# Patient Record
Sex: Female | Born: 1935
Health system: Southern US, Community
[De-identification: ages and names within clinical notes are randomized; demographics above are authoritative.]

## PROBLEM LIST (undated history)

## (undated) DIAGNOSIS — I509 Heart failure, unspecified: Secondary | ICD-10-CM

## (undated) DIAGNOSIS — I10 Essential (primary) hypertension: Secondary | ICD-10-CM

## (undated) DIAGNOSIS — I739 Peripheral vascular disease, unspecified: Secondary | ICD-10-CM

## (undated) DIAGNOSIS — K219 Gastro-esophageal reflux disease without esophagitis: Secondary | ICD-10-CM

## (undated) DIAGNOSIS — I70221 Atherosclerosis of native arteries of extremities with rest pain, right leg: Secondary | ICD-10-CM

## (undated) DIAGNOSIS — Z86718 Personal history of other venous thrombosis and embolism: Secondary | ICD-10-CM

## (undated) DIAGNOSIS — E1151 Type 2 diabetes mellitus with diabetic peripheral angiopathy without gangrene: Secondary | ICD-10-CM

## (undated) DIAGNOSIS — I4819 Other persistent atrial fibrillation: Secondary | ICD-10-CM

## (undated) DIAGNOSIS — H9193 Unspecified hearing loss, bilateral: Secondary | ICD-10-CM

## (undated) DIAGNOSIS — K449 Diaphragmatic hernia without obstruction or gangrene: Secondary | ICD-10-CM

## (undated) DIAGNOSIS — Z8711 Personal history of peptic ulcer disease: Secondary | ICD-10-CM

## (undated) HISTORY — DX: Peripheral vascular disease, unspecified: I73.9

## (undated) HISTORY — DX: Essential (primary) hypertension: I10

## (undated) HISTORY — DX: Gastro-esophageal reflux disease without esophagitis: K21.9

## (undated) HISTORY — DX: Atherosclerosis of native arteries of extremities with rest pain, right leg: I70.221

## (undated) HISTORY — DX: Personal history of peptic ulcer disease: Z87.11

## (undated) HISTORY — DX: Diaphragmatic hernia without obstruction or gangrene: K44.9

## (undated) HISTORY — DX: Personal history of other venous thrombosis and embolism: Z86.718

## (undated) HISTORY — DX: Other persistent atrial fibrillation: I48.19

## (undated) HISTORY — DX: Heart failure, unspecified: I50.9

## (undated) HISTORY — DX: Type 2 diabetes mellitus with diabetic peripheral angiopathy without gangrene: E11.51

## (undated) HISTORY — DX: Unspecified hearing loss, bilateral: H91.93

---

## 2019-03-08 ENCOUNTER — Inpatient Hospital Stay (HOSPITAL_COMMUNITY): Payer: Medicare Other | Admitting: Certified Registered Nurse Anesthetist

## 2019-03-08 ENCOUNTER — Inpatient Hospital Stay (HOSPITAL_COMMUNITY): Payer: Medicare Other

## 2019-03-08 ENCOUNTER — Inpatient Hospital Stay (HOSPITAL_COMMUNITY)
Admission: EM | Admit: 2019-03-08 | Discharge: 2019-03-12 | DRG: 481 | Disposition: A | Discharge status: Skilled Nursing Facility | Payer: Medicare Other | Attending: Internal Medicine | Admitting: Internal Medicine

## 2019-03-08 ENCOUNTER — Other Ambulatory Visit: Payer: Self-pay

## 2019-03-08 ENCOUNTER — Emergency Department (HOSPITAL_COMMUNITY): Payer: Medicare Other

## 2019-03-08 ENCOUNTER — Encounter (HOSPITAL_COMMUNITY): Payer: Self-pay

## 2019-03-08 ENCOUNTER — Encounter (HOSPITAL_COMMUNITY)
Admission: EM | Disposition: A | Discharge status: Skilled Nursing Facility | Payer: Self-pay | Source: Home / Self Care | Attending: Internal Medicine

## 2019-03-08 DIAGNOSIS — Y92008 Other place in unspecified non-institutional (private) residence as the place of occurrence of the external cause: Secondary | ICD-10-CM

## 2019-03-08 DIAGNOSIS — I10 Essential (primary) hypertension: Secondary | ICD-10-CM | POA: Diagnosis not present

## 2019-03-08 DIAGNOSIS — I491 Atrial premature depolarization: Secondary | ICD-10-CM | POA: Diagnosis not present

## 2019-03-08 DIAGNOSIS — S72001A Fracture of unspecified part of neck of right femur, initial encounter for closed fracture: Secondary | ICD-10-CM | POA: Diagnosis not present

## 2019-03-08 DIAGNOSIS — M25561 Pain in right knee: Secondary | ICD-10-CM | POA: Diagnosis present

## 2019-03-08 DIAGNOSIS — E875 Hyperkalemia: Secondary | ICD-10-CM | POA: Diagnosis not present

## 2019-03-08 DIAGNOSIS — S72491A Other fracture of lower end of right femur, initial encounter for closed fracture: Secondary | ICD-10-CM | POA: Diagnosis not present

## 2019-03-08 DIAGNOSIS — S72401A Unspecified fracture of lower end of right femur, initial encounter for closed fracture: Secondary | ICD-10-CM | POA: Diagnosis not present

## 2019-03-08 DIAGNOSIS — R9431 Abnormal electrocardiogram [ECG] [EKG]: Secondary | ICD-10-CM | POA: Diagnosis not present

## 2019-03-08 DIAGNOSIS — Y9301 Activity, walking, marching and hiking: Secondary | ICD-10-CM | POA: Diagnosis present

## 2019-03-08 DIAGNOSIS — W010XXA Fall on same level from slipping, tripping and stumbling without subsequent striking against object, initial encounter: Secondary | ICD-10-CM | POA: Diagnosis present

## 2019-03-08 DIAGNOSIS — Z20828 Contact with and (suspected) exposure to other viral communicable diseases: Secondary | ICD-10-CM | POA: Diagnosis not present

## 2019-03-08 DIAGNOSIS — S72462S Displaced supracondylar fracture with intracondylar extension of lower end of left femur, sequela: Secondary | ICD-10-CM | POA: Diagnosis not present

## 2019-03-08 DIAGNOSIS — N390 Urinary tract infection, site not specified: Secondary | ICD-10-CM | POA: Diagnosis not present

## 2019-03-08 DIAGNOSIS — D62 Acute posthemorrhagic anemia: Secondary | ICD-10-CM | POA: Diagnosis not present

## 2019-03-08 DIAGNOSIS — Z4789 Encounter for other orthopedic aftercare: Secondary | ICD-10-CM | POA: Diagnosis not present

## 2019-03-08 DIAGNOSIS — J986 Disorders of diaphragm: Secondary | ICD-10-CM | POA: Diagnosis not present

## 2019-03-08 DIAGNOSIS — D72829 Elevated white blood cell count, unspecified: Secondary | ICD-10-CM | POA: Diagnosis not present

## 2019-03-08 DIAGNOSIS — E876 Hypokalemia: Secondary | ICD-10-CM | POA: Diagnosis not present

## 2019-03-08 DIAGNOSIS — Z03818 Encounter for observation for suspected exposure to other biological agents ruled out: Secondary | ICD-10-CM | POA: Diagnosis not present

## 2019-03-08 DIAGNOSIS — R58 Hemorrhage, not elsewhere classified: Secondary | ICD-10-CM | POA: Diagnosis not present

## 2019-03-08 DIAGNOSIS — S72009A Fracture of unspecified part of neck of unspecified femur, initial encounter for closed fracture: Secondary | ICD-10-CM | POA: Diagnosis present

## 2019-03-08 DIAGNOSIS — S72461A Displaced supracondylar fracture with intracondylar extension of lower end of right femur, initial encounter for closed fracture: Principal | ICD-10-CM | POA: Diagnosis present

## 2019-03-08 DIAGNOSIS — D500 Iron deficiency anemia secondary to blood loss (chronic): Secondary | ICD-10-CM | POA: Diagnosis not present

## 2019-03-08 DIAGNOSIS — M2550 Pain in unspecified joint: Secondary | ICD-10-CM | POA: Diagnosis not present

## 2019-03-08 DIAGNOSIS — S72491D Other fracture of lower end of right femur, subsequent encounter for closed fracture with routine healing: Secondary | ICD-10-CM | POA: Diagnosis not present

## 2019-03-08 DIAGNOSIS — I959 Hypotension, unspecified: Secondary | ICD-10-CM | POA: Diagnosis not present

## 2019-03-08 DIAGNOSIS — I119 Hypertensive heart disease without heart failure: Secondary | ICD-10-CM | POA: Diagnosis present

## 2019-03-08 DIAGNOSIS — R0902 Hypoxemia: Secondary | ICD-10-CM | POA: Diagnosis not present

## 2019-03-08 DIAGNOSIS — W19XXXA Unspecified fall, initial encounter: Secondary | ICD-10-CM

## 2019-03-08 DIAGNOSIS — S72462A Displaced supracondylar fracture with intracondylar extension of lower end of left femur, initial encounter for closed fracture: Secondary | ICD-10-CM | POA: Diagnosis not present

## 2019-03-08 DIAGNOSIS — I4891 Unspecified atrial fibrillation: Secondary | ICD-10-CM | POA: Diagnosis not present

## 2019-03-08 DIAGNOSIS — I499 Cardiac arrhythmia, unspecified: Secondary | ICD-10-CM | POA: Diagnosis not present

## 2019-03-08 DIAGNOSIS — R2689 Other abnormalities of gait and mobility: Secondary | ICD-10-CM | POA: Diagnosis not present

## 2019-03-08 DIAGNOSIS — F0390 Unspecified dementia without behavioral disturbance: Secondary | ICD-10-CM | POA: Diagnosis not present

## 2019-03-08 DIAGNOSIS — R52 Pain, unspecified: Secondary | ICD-10-CM | POA: Diagnosis not present

## 2019-03-08 DIAGNOSIS — Z7401 Bed confinement status: Secondary | ICD-10-CM | POA: Diagnosis not present

## 2019-03-08 DIAGNOSIS — Z01818 Encounter for other preprocedural examination: Secondary | ICD-10-CM | POA: Diagnosis not present

## 2019-03-08 DIAGNOSIS — S72462D Displaced supracondylar fracture with intracondylar extension of lower end of left femur, subsequent encounter for closed fracture with routine healing: Secondary | ICD-10-CM | POA: Diagnosis not present

## 2019-03-08 DIAGNOSIS — R278 Other lack of coordination: Secondary | ICD-10-CM | POA: Diagnosis not present

## 2019-03-08 DIAGNOSIS — Z419 Encounter for procedure for purposes other than remedying health state, unspecified: Secondary | ICD-10-CM

## 2019-03-08 HISTORY — PX: ORIF FEMUR FRACTURE: SHX2119

## 2019-03-08 LAB — CBC WITH DIFFERENTIAL/PLATELET
Abs Immature Granulocytes: 0.06 10*3/uL (ref 0.00–0.07)
Basophils Absolute: 0 10*3/uL (ref 0.0–0.1)
Basophils Relative: 0 %
Eosinophils Absolute: 0 10*3/uL (ref 0.0–0.5)
Eosinophils Relative: 0 %
HCT: 41.5 % (ref 36.0–46.0)
Hemoglobin: 13.6 g/dL (ref 12.0–15.0)
Immature Granulocytes: 0 %
Lymphocytes Relative: 7 %
Lymphs Abs: 1.1 10*3/uL (ref 0.7–4.0)
MCH: 32 pg (ref 26.0–34.0)
MCHC: 32.8 g/dL (ref 30.0–36.0)
MCV: 97.6 fL (ref 80.0–100.0)
Monocytes Absolute: 0.6 10*3/uL (ref 0.1–1.0)
Monocytes Relative: 4 %
Neutro Abs: 13.9 10*3/uL — ABNORMAL HIGH (ref 1.7–7.7)
Neutrophils Relative %: 89 %
Platelets: 233 10*3/uL (ref 150–400)
RBC: 4.25 MIL/uL (ref 3.87–5.11)
RDW: 12.7 % (ref 11.5–15.5)
WBC: 15.7 10*3/uL — ABNORMAL HIGH (ref 4.0–10.5)
nRBC: 0 % (ref 0.0–0.2)

## 2019-03-08 LAB — BASIC METABOLIC PANEL
Anion gap: 9 (ref 5–15)
Anion gap: 9 (ref 5–15)
BUN: 17 mg/dL (ref 8–23)
BUN: 18 mg/dL (ref 8–23)
CO2: 27 mmol/L (ref 22–32)
CO2: 28 mmol/L (ref 22–32)
Calcium: 9.1 mg/dL (ref 8.9–10.3)
Calcium: 9.4 mg/dL (ref 8.9–10.3)
Chloride: 100 mmol/L (ref 98–111)
Chloride: 101 mmol/L (ref 98–111)
Creatinine, Ser: 0.87 mg/dL (ref 0.44–1.00)
Creatinine, Ser: 0.69 mg/dL (ref 0.44–1.00)
GFR calc Af Amer: >60 mL/min (ref >60)
GFR calc Af Amer: >60 mL/min (ref >60)
GFR calc non Af Amer: >60 mL/min (ref >60)
GFR calc non Af Amer: >60 mL/min (ref >60)
Glucose, Bld: 129 mg/dL — ABNORMAL HIGH (ref 70–99)
Glucose, Bld: 124 mg/dL — ABNORMAL HIGH (ref 70–99)
Potassium: 5.8 mmol/L — ABNORMAL HIGH (ref 3.5–5.1)
Potassium: 3.9 mmol/L (ref 3.5–5.1)
Sodium: 136 mmol/L (ref 135–145)
Sodium: 138 mmol/L (ref 135–145)

## 2019-03-08 LAB — URINALYSIS, ROUTINE W REFLEX MICROSCOPIC
Bilirubin Urine: NEGATIVE
Glucose, UA: NEGATIVE mg/dL
Ketones, ur: NEGATIVE mg/dL
Nitrite: NEGATIVE
Protein, ur: 30 mg/dL — AB
Specific Gravity, Urine: 1.006 (ref 1.005–1.030)
pH: 7 (ref 5.0–8.0)

## 2019-03-08 LAB — SURGICAL PCR SCREEN
MRSA, PCR: NEGATIVE
Staphylococcus aureus: NEGATIVE

## 2019-03-08 LAB — TYPE AND SCREEN
ABO/RH(D): A POS
Antibody Screen: NEGATIVE

## 2019-03-08 LAB — SARS CORONAVIRUS 2 BY RT PCR (HOSPITAL ORDER, PERFORMED IN ~~LOC~~ HOSPITAL LAB): SARS Coronavirus 2: NEGATIVE

## 2019-03-08 LAB — ABO/RH: ABO/RH(D): A POS

## 2019-03-08 LAB — PROTIME-INR
INR: 1 (ref 0.8–1.2)
Prothrombin Time: 12.7 seconds (ref 11.4–15.2)

## 2019-03-08 SURGERY — OPEN REDUCTION INTERNAL FIXATION (ORIF) DISTAL FEMUR FRACTURE
Anesthesia: General | Laterality: Right

## 2019-03-08 MED ORDER — ONDANSETRON HCL 4 MG PO TABS: 4.0000 mg | ORAL_TABLET | Freq: Four times a day (QID) | ORAL | Status: DC | PRN

## 2019-03-08 MED ORDER — SODIUM CHLORIDE 0.9 % IV SOLN
INTRAVENOUS | Status: DC | PRN
  Administered 2019-03-08: 25 ug/min via INTRAVENOUS

## 2019-03-08 MED ORDER — HYDROCODONE-ACETAMINOPHEN 5-325 MG PO TABS
1.0000 | ORAL_TABLET | ORAL | Status: DC | PRN
  Administered 2019-03-09 – 2019-03-12 (×9): 1 via ORAL
  Filled 2019-03-08: qty 2
  Filled 2019-03-08 (×8): qty 1

## 2019-03-08 MED ORDER — LACTATED RINGERS IV SOLN
INTRAVENOUS | Status: DC | PRN
  Administered 2019-03-08 (×2): via INTRAVENOUS

## 2019-03-08 MED ORDER — ASPIRIN EC 325 MG PO TBEC
325.0000 mg | DELAYED_RELEASE_TABLET | Freq: Every day | ORAL | Status: DC
  Administered 2019-03-09 – 2019-03-12 (×4): 325 mg via ORAL
  Filled 2019-03-08 (×4): qty 1

## 2019-03-08 MED ORDER — SUCCINYLCHOLINE CHLORIDE 200 MG/10ML IV SOSY
PREFILLED_SYRINGE | INTRAVENOUS | Status: AC
  Filled 2019-03-08: qty 10

## 2019-03-08 MED ORDER — SUGAMMADEX SODIUM 200 MG/2ML IV SOLN
INTRAVENOUS | Status: DC | PRN
  Administered 2019-03-08: 175 mg via INTRAVENOUS

## 2019-03-08 MED ORDER — CALCIUM GLUCONATE-NACL 1-0.675 GM/50ML-% IV SOLN
1.0000 g | Freq: Once | INTRAVENOUS | Status: AC
  Administered 2019-03-08: 1000 mg via INTRAVENOUS
  Filled 2019-03-08: qty 50

## 2019-03-08 MED ORDER — DEXAMETHASONE SODIUM PHOSPHATE 4 MG/ML IJ SOLN
INTRAMUSCULAR | Status: DC | PRN
  Administered 2019-03-08: 6 mg via INTRAVENOUS

## 2019-03-08 MED ORDER — PROPOFOL 10 MG/ML IV BOLUS
INTRAVENOUS | Status: DC | PRN
  Administered 2019-03-08: 90 mg via INTRAVENOUS

## 2019-03-08 MED ORDER — HYDROCODONE-ACETAMINOPHEN 7.5-325 MG PO TABS
1.0000 | ORAL_TABLET | ORAL | Status: DC | PRN
  Administered 2019-03-10: 1 via ORAL
  Filled 2019-03-08: qty 1

## 2019-03-08 MED ORDER — TRANEXAMIC ACID-NACL 1000-0.7 MG/100ML-% IV SOLN
1000.0000 mg | INTRAVENOUS | Status: AC
  Administered 2019-03-08: 1000 mg via INTRAVENOUS
  Filled 2019-03-08: qty 100

## 2019-03-08 MED ORDER — METOCLOPRAMIDE HCL 5 MG/ML IJ SOLN
10.0000 mg | Freq: Once | INTRAMUSCULAR | Status: AC | PRN
  Administered 2019-03-08: 10 mg via INTRAVENOUS

## 2019-03-08 MED ORDER — PHENYLEPHRINE HCL (PRESSORS) 10 MG/ML IV SOLN
INTRAVENOUS | Status: DC | PRN
  Administered 2019-03-08: 120 ug via INTRAVENOUS

## 2019-03-08 MED ORDER — TRANEXAMIC ACID-NACL 1000-0.7 MG/100ML-% IV SOLN
INTRAVENOUS | Status: AC
  Filled 2019-03-08: qty 100

## 2019-03-08 MED ORDER — PHENOL 1.4 % MT LIQD: 1.0000 | OROMUCOSAL | Status: DC | PRN

## 2019-03-08 MED ORDER — METHOCARBAMOL 500 MG PO TABS
500.0000 mg | ORAL_TABLET | Freq: Four times a day (QID) | ORAL | Status: DC | PRN
  Administered 2019-03-09: 500 mg via ORAL
  Filled 2019-03-08: qty 1

## 2019-03-08 MED ORDER — LIDOCAINE 2% (20 MG/ML) 5 ML SYRINGE
INTRAMUSCULAR | Status: AC
  Filled 2019-03-08: qty 5

## 2019-03-08 MED ORDER — ONDANSETRON HCL 4 MG/2ML IJ SOLN
INTRAMUSCULAR | Status: AC
  Filled 2019-03-08: qty 2

## 2019-03-08 MED ORDER — DOCUSATE SODIUM 100 MG PO CAPS
100.0000 mg | ORAL_CAPSULE | Freq: Two times a day (BID) | ORAL | Status: DC
  Administered 2019-03-08 – 2019-03-12 (×7): 100 mg via ORAL
  Filled 2019-03-08 (×8): qty 1

## 2019-03-08 MED ORDER — METOCLOPRAMIDE HCL 5 MG/ML IJ SOLN: 5.0000 mg | Freq: Three times a day (TID) | INTRAMUSCULAR | Status: DC | PRN

## 2019-03-08 MED ORDER — ONDANSETRON HCL 4 MG/2ML IJ SOLN
INTRAMUSCULAR | Status: DC | PRN
  Administered 2019-03-08: 4 mg via INTRAVENOUS

## 2019-03-08 MED ORDER — ACETAMINOPHEN 325 MG PO TABS: 325.0000 mg | ORAL_TABLET | Freq: Four times a day (QID) | ORAL | Status: DC | PRN

## 2019-03-08 MED ORDER — MORPHINE SULFATE (PF) 2 MG/ML IV SOLN
2.0000 mg | INTRAVENOUS | Status: DC | PRN
  Administered 2019-03-08: 2 mg via INTRAVENOUS
  Filled 2019-03-08: qty 1

## 2019-03-08 MED ORDER — MORPHINE SULFATE (PF) 2 MG/ML IV SOLN
0.5000 mg | INTRAVENOUS | Status: DC | PRN
  Administered 2019-03-08: 0.5 mg via INTRAVENOUS
  Administered 2019-03-11: 1 mg via INTRAVENOUS
  Filled 2019-03-08 (×2): qty 1

## 2019-03-08 MED ORDER — LIDOCAINE HCL (CARDIAC) PF 100 MG/5ML IV SOSY
PREFILLED_SYRINGE | INTRAVENOUS | Status: DC | PRN
  Administered 2019-03-08: 75 mg via INTRAVENOUS

## 2019-03-08 MED ORDER — FENTANYL CITRATE (PF) 100 MCG/2ML IJ SOLN
INTRAMUSCULAR | Status: AC
  Administered 2019-03-08: 25 ug via INTRAVENOUS
  Filled 2019-03-08: qty 2

## 2019-03-08 MED ORDER — CEFAZOLIN SODIUM-DEXTROSE 2-4 GM/100ML-% IV SOLN
2.0000 g | Freq: Once | INTRAVENOUS | Status: AC
  Administered 2019-03-08: 2 g via INTRAVENOUS

## 2019-03-08 MED ORDER — ONDANSETRON HCL 4 MG/2ML IJ SOLN: 4.0000 mg | Freq: Four times a day (QID) | INTRAMUSCULAR | Status: DC | PRN

## 2019-03-08 MED ORDER — ROCURONIUM BROMIDE 10 MG/ML (PF) SYRINGE
PREFILLED_SYRINGE | INTRAVENOUS | Status: AC
  Filled 2019-03-08: qty 10

## 2019-03-08 MED ORDER — CEFAZOLIN SODIUM-DEXTROSE 2-4 GM/100ML-% IV SOLN
2.0000 g | Freq: Four times a day (QID) | INTRAVENOUS | Status: AC
  Administered 2019-03-08 – 2019-03-09 (×2): 2 g via INTRAVENOUS
  Filled 2019-03-08 (×2): qty 100

## 2019-03-08 MED ORDER — FENTANYL CITRATE (PF) 100 MCG/2ML IJ SOLN
25.0000 ug | INTRAMUSCULAR | Status: DC | PRN
  Administered 2019-03-08 (×2): 25 ug via INTRAVENOUS

## 2019-03-08 MED ORDER — FENTANYL CITRATE (PF) 100 MCG/2ML IJ SOLN
50.0000 ug | INTRAMUSCULAR | Status: DC | PRN
  Administered 2019-03-08: 50 ug via INTRAVENOUS
  Filled 2019-03-08: qty 2

## 2019-03-08 MED ORDER — TRAMADOL HCL 50 MG PO TABS: 50.0000 mg | ORAL_TABLET | Freq: Two times a day (BID) | ORAL | Status: DC | PRN

## 2019-03-08 MED ORDER — METOCLOPRAMIDE HCL 5 MG/ML IJ SOLN
INTRAMUSCULAR | Status: AC
  Filled 2019-03-08: qty 2

## 2019-03-08 MED ORDER — ROCURONIUM BROMIDE 100 MG/10ML IV SOLN
INTRAVENOUS | Status: DC | PRN
  Administered 2019-03-08: 30 mg via INTRAVENOUS

## 2019-03-08 MED ORDER — MENTHOL 3 MG MT LOZG: 1.0000 | LOZENGE | OROMUCOSAL | Status: DC | PRN

## 2019-03-08 MED ORDER — SENNOSIDES-DOCUSATE SODIUM 8.6-50 MG PO TABS
2.0000 | ORAL_TABLET | Freq: Every day | ORAL | Status: DC
  Administered 2019-03-08 – 2019-03-11 (×3): 2 via ORAL
  Filled 2019-03-08 (×4): qty 2

## 2019-03-08 MED ORDER — DEXAMETHASONE SODIUM PHOSPHATE 10 MG/ML IJ SOLN
INTRAMUSCULAR | Status: AC
  Filled 2019-03-08: qty 1

## 2019-03-08 MED ORDER — FENTANYL CITRATE (PF) 250 MCG/5ML IJ SOLN
INTRAMUSCULAR | Status: AC
  Filled 2019-03-08: qty 5

## 2019-03-08 MED ORDER — CEFAZOLIN SODIUM-DEXTROSE 2-4 GM/100ML-% IV SOLN
INTRAVENOUS | Status: AC
  Filled 2019-03-08: qty 100

## 2019-03-08 MED ORDER — FENTANYL CITRATE (PF) 100 MCG/2ML IJ SOLN
INTRAMUSCULAR | Status: DC | PRN
  Administered 2019-03-08 (×4): 50 ug via INTRAVENOUS

## 2019-03-08 MED ORDER — 0.9 % SODIUM CHLORIDE (POUR BTL) OPTIME
TOPICAL | Status: DC | PRN
  Administered 2019-03-08: 1000 mL

## 2019-03-08 MED ORDER — LACTATED RINGERS IV SOLN
INTRAVENOUS | Status: DC
  Administered 2019-03-08: 13:00:00 via INTRAVENOUS

## 2019-03-08 MED ORDER — HYDRALAZINE HCL 20 MG/ML IJ SOLN
5.0000 mg | Freq: Four times a day (QID) | INTRAMUSCULAR | Status: DC | PRN
  Administered 2019-03-12: 5 mg via INTRAVENOUS
  Filled 2019-03-08 (×2): qty 1

## 2019-03-08 MED ORDER — METHOCARBAMOL 500 MG IVPB - SIMPLE MED
INTRAVENOUS | Status: AC
  Administered 2019-03-08: 500 mg via INTRAVENOUS
  Filled 2019-03-08: qty 50

## 2019-03-08 MED ORDER — OXYCODONE HCL 5 MG PO TABS: 5.0000 mg | ORAL_TABLET | Freq: Four times a day (QID) | ORAL | Status: DC | PRN

## 2019-03-08 MED ORDER — LABETALOL HCL 5 MG/ML IV SOLN
INTRAVENOUS | Status: DC | PRN
  Administered 2019-03-08 (×2): 2.5 mg via INTRAVENOUS

## 2019-03-08 MED ORDER — LACTATED RINGERS IV SOLN
INTRAVENOUS | Status: DC
  Administered 2019-03-09 – 2019-03-11 (×4): via INTRAVENOUS

## 2019-03-08 MED ORDER — PANTOPRAZOLE SODIUM 40 MG PO TBEC
40.0000 mg | DELAYED_RELEASE_TABLET | Freq: Every day | ORAL | Status: DC
  Administered 2019-03-09 – 2019-03-12 (×4): 40 mg via ORAL
  Filled 2019-03-08 (×4): qty 1

## 2019-03-08 MED ORDER — SUCCINYLCHOLINE CHLORIDE 20 MG/ML IJ SOLN
INTRAMUSCULAR | Status: DC | PRN
  Administered 2019-03-08: 100 mg via INTRAVENOUS

## 2019-03-08 MED ORDER — ALBUMIN HUMAN 5 % IV SOLN
INTRAVENOUS | Status: DC | PRN
  Administered 2019-03-08: 17:00:00 via INTRAVENOUS

## 2019-03-08 MED ORDER — METOCLOPRAMIDE HCL 5 MG PO TABS: 5.0000 mg | ORAL_TABLET | Freq: Three times a day (TID) | ORAL | Status: DC | PRN

## 2019-03-08 MED ORDER — METHOCARBAMOL 500 MG IVPB - SIMPLE MED
500.0000 mg | Freq: Four times a day (QID) | INTRAVENOUS | Status: DC | PRN
  Administered 2019-03-08: 18:00:00 500 mg via INTRAVENOUS
  Filled 2019-03-08 (×2): qty 50

## 2019-03-08 MED ORDER — MORPHINE SULFATE (PF) 4 MG/ML IV SOLN
4.0000 mg | Freq: Once | INTRAVENOUS | Status: AC
  Administered 2019-03-08: 4 mg via INTRAVENOUS
  Filled 2019-03-08: qty 1

## 2019-03-08 MED ORDER — ACETAMINOPHEN 325 MG PO TABS: 650.0000 mg | ORAL_TABLET | Freq: Four times a day (QID) | ORAL | Status: DC | PRN

## 2019-03-08 MED ORDER — MEPERIDINE HCL 50 MG/ML IJ SOLN: 6.2500 mg | INTRAMUSCULAR | Status: DC | PRN

## 2019-03-08 SURGICAL SUPPLY — 50 items
BANDAGE ESMARK 6X9 LF (GAUZE/BANDAGES/DRESSINGS) ×1 IMPLANT
BIT DRILL 4.3 (BIT) ×1 IMPLANT
BIT DRILL QC 3.3X195 (BIT) ×2 IMPLANT
BNDG COHESIVE 4X5 TAN STRL (GAUZE/BANDAGES/DRESSINGS) ×2 IMPLANT
BNDG ELASTIC 6X5.8 VLCR STR LF (GAUZE/BANDAGES/DRESSINGS) ×2 IMPLANT
BNDG ESMARK 6X9 LF (GAUZE/BANDAGES/DRESSINGS) ×2
BNDG GAUZE ELAST 4 BULKY (GAUZE/BANDAGES/DRESSINGS) ×2 IMPLANT
CAP LOCK NCB (Cap) ×14 IMPLANT
COVER SURGICAL LIGHT HANDLE (MISCELLANEOUS) ×2 IMPLANT
COVER WAND RF STERILE (DRAPES) IMPLANT
CUFF TOURN SGL QUICK 34 (TOURNIQUET CUFF) ×1
CUFF TRNQT CYL 34X4.125X (TOURNIQUET CUFF) ×1 IMPLANT
DRAPE C-ARM 42X120 X-RAY (DRAPES) ×2 IMPLANT
DRAPE C-ARMOR (DRAPES) ×2 IMPLANT
DRAPE SHEET LG 3/4 BI-LAMINATE (DRAPES) ×2 IMPLANT
DRILL BIT 4.3 (BIT) ×1
DRSG AQUACEL AG ADV 3.5X10 (GAUZE/BANDAGES/DRESSINGS) IMPLANT
DRSG AQUACEL AG ADV 3.5X14 (GAUZE/BANDAGES/DRESSINGS) ×2 IMPLANT
DRSG EMULSION OIL 3X16 NADH (GAUZE/BANDAGES/DRESSINGS) IMPLANT
DRSG PAD ABDOMINAL 8X10 ST (GAUZE/BANDAGES/DRESSINGS) ×2 IMPLANT
ELECT REM PT RETURN 15FT ADLT (MISCELLANEOUS) ×2 IMPLANT
GAUZE SPONGE 4X4 12PLY STRL (GAUZE/BANDAGES/DRESSINGS) ×2 IMPLANT
GAUZE XEROFORM 5X9 LF (GAUZE/BANDAGES/DRESSINGS) ×2 IMPLANT
GLOVE BIO SURGEON STRL SZ7.5 (GLOVE) ×4 IMPLANT
GLOVE BIOGEL PI IND STRL 8 (GLOVE) ×1 IMPLANT
GLOVE BIOGEL PI INDICATOR 8 (GLOVE) ×1
GLOVE ECLIPSE 8.0 STRL XLNG CF (GLOVE) ×2 IMPLANT
GOWN STRL REUS W/TWL XL LVL3 (GOWN DISPOSABLE) ×2 IMPLANT
K-WIRE 2.0 (WIRE) ×2
K-WIRE FXSTD 280X2XNS SS (WIRE) ×2
KIT BASIN OR (CUSTOM PROCEDURE TRAY) ×2 IMPLANT
KIT TURNOVER KIT A (KITS) IMPLANT
KWIRE FXSTD 280X2XNS SS (WIRE) ×2 IMPLANT
NS IRRIG 1000ML POUR BTL (IV SOLUTION) ×2 IMPLANT
PACK TOTAL JOINT (CUSTOM PROCEDURE TRAY) ×2 IMPLANT
PLATE NCB PPP 9H (Plate) ×2 IMPLANT
PROTECTOR NERVE ULNAR (MISCELLANEOUS) ×2 IMPLANT
SCREW 5.0 70MM (Screw) ×4 IMPLANT
SCREW CORTICAL NCB 5.0X44 (Screw) ×2 IMPLANT
SCREW CORTICAL NCB 5.0X65 (Screw) ×6 IMPLANT
SCREW NCB 5.0X34MM (Screw) ×4 IMPLANT
SCREW NCB 5.0X38 (Screw) ×2 IMPLANT
STAPLER VISISTAT 35W (STAPLE) IMPLANT
STRIP CLOSURE SKIN 1/2X4 (GAUZE/BANDAGES/DRESSINGS) ×2 IMPLANT
SUT VIC AB 0 CT1 36 (SUTURE) ×2 IMPLANT
SUT VIC AB 1 CT1 36 (SUTURE) ×4 IMPLANT
SUT VIC AB 2-0 CT1 27 (SUTURE) ×2
SUT VIC AB 2-0 CT1 TAPERPNT 27 (SUTURE) ×2 IMPLANT
TOWEL OR 17X26 10 PK STRL BLUE (TOWEL DISPOSABLE) ×4 IMPLANT
WATER STERILE IRR 1000ML POUR (IV SOLUTION) ×2 IMPLANT

## 2019-03-08 NOTE — ED Notes (Signed)
10lbs bucks traction applied by Ortho Tech.

## 2019-03-08 NOTE — ED Notes (Signed)
ED TO INPATIENT HANDOFF REPORT  Name/Age/Gender Sarah Wolf 84 y.o. female  Code Status    Code Status Orders  (From admission, onward)         Start     Ordered   03/08/19 1215  Full code  Continuous     03/08/19 1214        Code Status History    This patient has a current code status but no historical code status.   Advance Care Planning Activity      Home/SNF/Other Home  Chief Complaint knee deformity  Level of Care/Admitting Diagnosis ED Disposition    ED Disposition Condition Comment   Admit  [facility] [[id number]]  Level of Care: Telemetry [5]  Admit to tele based on following criteria: Monitor for Ischemic changes  Covid Evaluation: Asymptomatic Screening Protocol (No Symptoms)  Diagnosis: Hip fracture [facility](HCC) [[id number]]) [[id number]]  Admitting Physician: Darlin DropHALL, CAROLE N [[id number]]  Attending Physician: Darlin DropHALL, CAROLE N [[id number]]  Estimated length of stay: past midnight tomorrow  Certification:: I certify this patient will need inpatient services for at least 2 midnights  PT Class (Do Not Modify): Inpatient [101]  PT Acc Code (Do Not Modify): Private [1]       Medical History History reviewed. No pertinent past medical history.  Allergies No Known Allergies  IV Location/Drains/Wounds Patient Lines/Drains/Airways Status   Active Line/Drains/Airways    Name:   Placement date:   Placement time:   Site:   Days:   Peripheral IV 03/08/19 Right Forearm   03/08/19    -    Forearm   less than 1   Peripheral IV 03/08/19 Left Forearm   03/08/19    1055    Forearm   less than 1          Labs/Imaging Results for orders placed or performed during the hospital encounter of 03/08/19 (from the past 48 hour(s))  Basic metabolic panel     Status: Abnormal   Collection Time: 03/08/19 10:44 AM  Result Value Ref Range   Sodium 136 135 - 145 mmol/L   Potassium 5.8 (H) 3.5 - 5.1 mmol/L   Chloride 100 98 - 111 mmol/L   CO2 27 22 - 32 mmol/L    Glucose, Bld 129 (H) 70 - 99 mg/dL   BUN 17 8 - 23 mg/dL   Creatinine, Ser 2.950.87 0.44 - 1.00 mg/dL   Calcium 9.1 8.9 - 62.110.3 mg/dL   GFR calc non Af Amer >60 >60 mL/min   GFR calc Af Amer >60 >60 mL/min   Anion gap 9 5 - 15    Comment: Performed at [facility], [address] [place], [place] [address]  CBC WITH DIFFERENTIAL     Status: Abnormal   Collection Time: 03/08/19 10:44 AM  Result Value Ref Range   WBC 15.7 (H) 4.0 - 10.5 K/uL   RBC 4.25 3.87 - 5.11 MIL/uL   Hemoglobin 13.6 12.0 - 15.0 g/dL   HCT 78.441.5 69.636.0 - 29.546.0 %   MCV 97.6 80.0 - 100.0 fL   MCH 32.0 26.0 - 34.0 pg   MCHC 32.8 30.0 - 36.0 g/dL   RDW 28.412.7 13.211.5 - 44.015.5 %   Platelets 233 150 - 400 K/uL   nRBC 0.0 0.0 - 0.2 %   Neutrophils Relative % 89 %   Neutro Abs 13.9 (H) 1.7 - 7.7 K/uL   Lymphocytes Relative 7 %   Lymphs Abs 1.1 0.7 - 4.0 K/uL  Monocytes Relative 4 %   Monocytes Absolute 0.6 0.1 - 1.0 K/uL   Eosinophils Relative 0 %   Eosinophils Absolute 0.0 0.0 - 0.5 K/uL   Basophils Relative 0 %   Basophils Absolute 0.0 0.0 - 0.1 K/uL   Immature Granulocytes 0 %   Abs Immature Granulocytes 0.06 0.00 - 0.07 K/uL    Comment: Performed at [facility], [address] [place], [place] [address]  Protime-INR     Status: None   Collection Time: 03/08/19 10:44 AM  Result Value Ref Range   Prothrombin Time 12.7 11.4 - 15.2 seconds   INR 1.0 0.8 - 1.2    Comment: (NOTE) INR goal varies based on device and disease states. Performed at [facility], [address] [place], [place] [address]   SARS Coronavirus 2 [facility](Hospital order, Performed in [facility] hospital lab) Nasopharyngeal Nasopharyngeal Swab     Status: None   Collection Time: 03/08/19 10:44 AM   Specimen: Nasopharyngeal Swab  Result Value Ref Range   SARS Coronavirus 2 NEGATIVE NEGATIVE    Comment: (NOTE) If result is NEGATIVE SARS-CoV-2 target nucleic acids are NOT DETECTED. The SARS-CoV-2  RNA is generally detectable in upper and lower  respiratory specimens during the acute phase of infection. The lowest  concentration of SARS-CoV-2 viral copies this assay can detect is 250  copies / mL. A negative result does not preclude SARS-CoV-2 infection  and should not be used as the sole basis for treatment or other  patient management decisions.  A negative result may occur with  improper specimen collection / handling, submission of specimen other  than nasopharyngeal swab, presence of viral mutation(s) within the  areas targeted by this assay, and inadequate number of viral copies  (<250 copies / mL). A negative result must be combined with clinical  observations, patient history, and epidemiological information. If result is POSITIVE SARS-CoV-2 target nucleic acids are DETECTED. The SARS-CoV-2 RNA is generally detectable in upper and lower  respiratory specimens dur ing the acute phase of infection.  Positive  results are indicative of active infection with SARS-CoV-2.  Clinical  correlation with patient history and other diagnostic information is  necessary to determine patient infection status.  Positive results do  not rule out bacterial infection or co-infection with other viruses. If result is PRESUMPTIVE POSTIVE SARS-CoV-2 nucleic acids MAY BE PRESENT.   A presumptive positive result was obtained on the submitted specimen  and confirmed on repeat testing.  While 2019 novel coronavirus  (SARS-CoV-2) nucleic acids may be present in the submitted sample  additional confirmatory testing may be necessary for epidemiological  and / or clinical management purposes  to differentiate between  SARS-CoV-2 and other Sarbecovirus currently known to infect humans.  If clinically indicated additional testing with an alternate test  methodology [phone]([id number]) is advised. The SARS-CoV-2 RNA is generally  detectable in upper and lower respiratory sp ecimens during the acute  phase of  infection. The expected result is Negative. Fact Sheet for Patients:  BoilerBrush.com.cyhttps://www.fda.gov/media/136312/download Fact Sheet for Healthcare Providers: https://pope.com/https://www.fda.gov/media/136313/download This test is not yet approved or cleared by the [place] FDA and has been authorized for detection and/or diagnosis of SARS-CoV-2 by FDA under an Emergency Use Authorization (EUA).  This EUA will remain in effect (meaning this test can be used) for the duration of the COVID-19 declaration under Section 564(b)(1) of the Act, 21 U.S.C. section 360bbb-3(b)(1), unless the authorization is terminated or revoked sooner. Performed at [facility]  [place] [address] [place], [place] [address]   Urinalysis, Routine w reflex microscopic     Status: Abnormal   Collection Time: 03/08/19 12:33 PM  Result Value Ref Range   Color, Urine YELLOW YELLOW   APPearance HAZY (A) CLEAR   Specific Gravity, Urine 1.006 1.005 - 1.030   pH 7.0 5.0 - 8.0   Glucose, UA NEGATIVE NEGATIVE mg/dL   Hgb urine dipstick SMALL (A) NEGATIVE   Bilirubin Urine NEGATIVE NEGATIVE   Ketones, ur NEGATIVE NEGATIVE mg/dL   Protein, ur 30 (A) NEGATIVE mg/dL   Nitrite NEGATIVE NEGATIVE   Leukocytes,Ua MODERATE (A) NEGATIVE   RBC / HPF 0-5 0 - 5 RBC/hpf   WBC, UA 21-50 0 - 5 WBC/hpf   Bacteria, UA FEW (A) NONE SEEN    Comment: Performed at [facility], [place] [address] [place], [place] [address]  Type and screen Ordered by PROVIDER DEFAULT     Status: None   Collection Time: 03/08/19  1:18 PM  Result Value Ref Range   ABO/RH(D) A POS    Antibody Screen NEG    Sample Expiration      03/11/2019,2359 Performed at [facility], [place] [address] [place], [place] [address]   Basic metabolic panel     Status: Abnormal   Collection Time: 03/08/19  1:18 PM  Result Value Ref Range   Sodium 138 135 - 145 mmol/L   Potassium 3.9 3.5 - 5.1 mmol/L    Comment: DELTA CHECK NOTED    Chloride 101 98 - 111 mmol/L   CO2 28 22 - 32 mmol/L   Glucose, Bld 124 (H) 70 - 99 mg/dL   BUN 18 8 - 23 mg/dL   Creatinine, Ser 0.69 0.44 - 1.00 mg/dL   Calcium 9.4 8.9 - 10.3 mg/dL   GFR calc non Af Amer >60 >60 mL/min   GFR calc Af Amer >60 >60 mL/min   Anion gap 9 5 - 15    Comment: Performed at [facility], [place] [address] [place], [place] [address]   Dg Chest Port 1 View  Result Date: 03/08/2019 CLINICAL DATA:  Preoperative evaluation for femur surgery EXAM: PORTABLE CHEST 1 VIEW COMPARISON:  None. FINDINGS: Elevation of left hemidiaphragm is noted. No focal infiltrate or sizable effusion is seen. Cardiac shadow is at the upper limits of normal in size. Aortic calcifications are noted. IMPRESSION: No acute abnormality noted. Electronically Signed   By: Inez Catalina M.D.   On: 03/08/2019 11:07   Dg Knee Complete 4 Views Right  Result Date: 03/08/2019 CLINICAL DATA:  Pain following fall EXAM: RIGHT KNEE - COMPLETE 4+ VIEW COMPARISON:  None. FINDINGS: Frontal, lateral, and bilateral oblique views were obtained. There is a comminuted fracture of the femur at the diaphysis-metaphysis junction. A fracture fragment extends parallel to the cortex to extend to the joint space just lateral to the midline of the distal femur. There is posterior and lateral displacement of the distal major fracture fragment with respect to the proximal major fracture fragment. No gross dislocation. No appreciable joint effusion. No appreciable joint space narrowing or erosion. IMPRESSION: Comminuted fracture distal femur with a fracture fragment extending to the level of the knee joint along the distal femoral condyle just lateral to the midline. There is posterior and lateral displacement of the major distal fracture fragment with respect to the major proximal fragment. No gross dislocation. Other visualized bony structures appear intact. Electronically Signed  By: Bretta BangWilliam  Woodruff III M.D.   On:  03/08/2019 10:26    Pending Labs Unresulted Labs (From admission, onward)    Start     Ordered   03/09/19 0500  CBC with Differential/Platelet  Tomorrow morning,   R     03/08/19 1238   03/09/19 0500  Comprehensive metabolic panel  Tomorrow morning,   R     03/08/19 1238   03/09/19 0500  Magnesium  Tomorrow morning,   R     03/08/19 1238   03/09/19 0500  Phosphorus  Tomorrow morning,   R     03/08/19 1238   03/08/19 1250  ABO/Rh  Once,   R     03/08/19 1250          Vitals/Pain Today's Vitals   03/08/19 1300 03/08/19 1400 03/08/19 1406 03/08/19 1430  BP: 140/68 116/60  (!) 102/51  Pulse: 65 65  65  Resp: 12 17  16   Temp:      TempSrc:      SpO2: 94% 95%  (!) 85%  PainSc:   6      Isolation Precautions No active isolations  Medications Medications  morphine 2 MG/ML injection 2 mg (2 mg Intravenous Given 03/08/19 1307)  oxyCODONE (Oxy IR/ROXICODONE) immediate release tablet 5 mg (has no administration in time range)  traMADol (ULTRAM) tablet 50 mg (has no administration in time range)  senna-docusate (Senokot-S) tablet 2 tablet (has no administration in time range)  ondansetron (ZOFRAN) injection 4 mg (has no administration in time range)  acetaminophen (TYLENOL) tablet 650 mg (has no administration in time range)  hydrALAZINE (APRESOLINE) injection 5 mg (has no administration in time range)  lactated ringers infusion (has no administration in time range)  morphine 4 MG/ML injection 4 mg (4 mg Intravenous Given 03/08/19 1044)  calcium gluconate 1 g/ 50 mL sodium chloride IVPB (0 g Intravenous Stopped 03/08/19 1427)    Mobility non-ambulatory

## 2019-03-08 NOTE — Brief Op Note (Signed)
03/08/2019  5:26 PM  PATIENT:  Sarah Wolf  84 y.o. female  PRE-OPERATIVE DIAGNOSIS:  RIGHT FEMUR FRACTURE  POST-OPERATIVE DIAGNOSIS:  RIGHT FEMUR FRACTURE  PROCEDURE:  Procedure(s): OPEN REDUCTION INTERNAL FIXATION (ORIF) DISTAL FEMUR FRACTURE (Right)  SURGEON:  Surgeon(s) and Role:    Mcarthur Rossetti, MD - Primary  PHYSICIAN ASSISTANT:  Benita Stabile, PA-C  ANESTHESIA:   general  EBL:  200 cc  COUNTS:  YES  TOURNIQUET:  * No tourniquets in log *  DICTATION: .Other Dictation: Dictation Number [id number]  PLAN OF CARE: Admit to inpatient   PATIENT DISPOSITION:  PACU - guarded condition.   Delay start of Pharmacological VTE agent (>24hrs) due to surgical blood loss or risk of bleeding: no

## 2019-03-08 NOTE — ED Triage Notes (Signed)
Patient BIB EMS from home. Patient reports trip and fall in her living room approx 0630 this morning. Patient denies loss of consciousness. Patient complaining of Right knee pain- deformity noted. Denies back pain, denies hip pain. Palpable pulses equal to bilateral lower extremities. 151mcg IV Fentanyl administered en route- last dose at 0821 to 22g Right FA PIV. Patient also received 4mg  IV Zofran en route for nausea.

## 2019-03-08 NOTE — ED Notes (Signed)
Patient clothing, purse, watch, and belongings transported with patient to short stay. Patient VSS.

## 2019-03-08 NOTE — Consult Note (Addendum)
Reason for Consult:Acute Right distal femur fracture Referring Physician: Patriciaann ClanEvans, Amanda  Ramata Walkowski is an 84 y.o. female.  HPI: Patient 84 year old female who was getting up this morning to go to the bathroom when the rug slipped out from under her feet causing her to fall she states she fell on her right knee.  She denies any other injury.  Denies any loss consciousness dizziness.  When asked about her medical history she states she has no known medical problems however she has not seen a physician within the last 50 years.  Last meal was yesterday. Patient lives at home alone. Has a nephew who lives "near by" but no other family.   History reviewed. No pertinent past medical history.    History reviewed. No pertinent family history.  Family history unknown due to the fact the patient's mother died whenever she was 84 years old her father was killed by gunshot when  she was also very young.  Social History: Denies alcohol or tobacco abuse.  Allergies: No Known Allergies  Medications: I have reviewed the patient's current medications.  Results for orders placed or performed during the hospital encounter of 03/08/19 (from the past 48 hour(s))  Basic metabolic panel     Status: Abnormal   Collection Time: 03/08/19 10:44 AM  Result Value Ref Range   Sodium 136 135 - 145 mmol/L   Potassium 5.8 (H) 3.5 - 5.1 mmol/L   Chloride 100 98 - 111 mmol/L   CO2 27 22 - 32 mmol/L   Glucose, Bld 129 (H) 70 - 99 mg/dL   BUN 17 8 - 23 mg/dL   Creatinine, Ser 1.610.87 0.44 - 1.00 mg/dL   Calcium 9.1 8.9 - 09.610.3 mg/dL   GFR calc non Af Amer >60 >60 mL/min   GFR calc Af Amer >60 >60 mL/min   Anion gap 9 5 - 15    Comment: Performed at [facility], [address] [place], [place] [address]  CBC WITH DIFFERENTIAL     Status: Abnormal   Collection Time: 03/08/19 10:44 AM  Result Value Ref Range   WBC 15.7 (H) 4.0 - 10.5 K/uL   RBC 4.25 3.87 - 5.11 MIL/uL   Hemoglobin 13.6 12.0 - 15.0  g/dL   HCT 98.141.5 19.136.0 - 47.846.0 %   MCV 97.6 80.0 - 100.0 fL   MCH 32.0 26.0 - 34.0 pg   MCHC 32.8 30.0 - 36.0 g/dL   RDW 29.512.7 62.111.5 - 30.815.5 %   Platelets 233 150 - 400 K/uL   nRBC 0.0 0.0 - 0.2 %   Neutrophils Relative % 89 %   Neutro Abs 13.9 (H) 1.7 - 7.7 K/uL   Lymphocytes Relative 7 %   Lymphs Abs 1.1 0.7 - 4.0 K/uL   Monocytes Relative 4 %   Monocytes Absolute 0.6 0.1 - 1.0 K/uL   Eosinophils Relative 0 %   Eosinophils Absolute 0.0 0.0 - 0.5 K/uL   Basophils Relative 0 %   Basophils Absolute 0.0 0.0 - 0.1 K/uL   Immature Granulocytes 0 %   Abs Immature Granulocytes 0.06 0.00 - 0.07 K/uL    Comment: Performed at [facility], [address] [place], [place] [address]  Protime-INR     Status: None   Collection Time: 03/08/19 10:44 AM  Result Value Ref Range   Prothrombin Time 12.7 11.4 - 15.2 seconds   INR 1.0 0.8 - 1.2    Comment: (NOTE) INR goal varies based on device  and disease states. Performed at [facility], [address] [place], [place] [address]   SARS Coronavirus 2 [facility](Hospital order, Performed in [facility] hospital lab) Nasopharyngeal Nasopharyngeal Swab     Status: None   Collection Time: 03/08/19 10:44 AM   Specimen: Nasopharyngeal Swab  Result Value Ref Range   SARS Coronavirus 2 NEGATIVE NEGATIVE    Comment: (NOTE) If result is NEGATIVE SARS-CoV-2 target nucleic acids are NOT DETECTED. The SARS-CoV-2 RNA is generally detectable in upper and lower  respiratory specimens during the acute phase of infection. The lowest  concentration of SARS-CoV-2 viral copies this assay can detect is 250  copies / mL. A negative result does not preclude SARS-CoV-2 infection  and should not be used as the sole basis for treatment or other  patient management decisions.  A negative result may occur with  improper specimen collection / handling, submission of specimen other  than nasopharyngeal swab, presence of viral mutation(s) within  the  areas targeted by this assay, and inadequate number of viral copies  (<250 copies / mL). A negative result must be combined with clinical  observations, patient history, and epidemiological information. If result is POSITIVE SARS-CoV-2 target nucleic acids are DETECTED. The SARS-CoV-2 RNA is generally detectable in upper and lower  respiratory specimens dur ing the acute phase of infection.  Positive  results are indicative of active infection with SARS-CoV-2.  Clinical  correlation with patient history and other diagnostic information is  necessary to determine patient infection status.  Positive results do  not rule out bacterial infection or co-infection with other viruses. If result is PRESUMPTIVE POSTIVE SARS-CoV-2 nucleic acids MAY BE PRESENT.   A presumptive positive result was obtained on the submitted specimen  and confirmed on repeat testing.  While 2019 novel coronavirus  (SARS-CoV-2) nucleic acids may be present in the submitted sample  additional confirmatory testing may be necessary for epidemiological  and / or clinical management purposes  to differentiate between  SARS-CoV-2 and other Sarbecovirus currently known to infect humans.  If clinically indicated additional testing with an alternate test  methodology [phone]([id number]) is advised. The SARS-CoV-2 RNA is generally  detectable in upper and lower respiratory sp ecimens during the acute  phase of infection. The expected result is Negative. Fact Sheet for Patients:  BoilerBrush.com.cyhttps://www.fda.gov/media/136312/download Fact Sheet for Healthcare Providers: https://pope.com/https://www.fda.gov/media/136313/download This test is not yet approved or cleared by the [place] FDA and has been authorized for detection and/or diagnosis of SARS-CoV-2 by FDA under an Emergency Use Authorization (EUA).  This EUA will remain in effect (meaning this test can be used) for the duration of the COVID-19 declaration under Section 564(b)(1) of the Act, 21  U.S.C. section 360bbb-3(b)(1), unless the authorization is terminated or revoked sooner. Performed at [facility], [address] [place], [place] [address]   Urinalysis, Routine w reflex microscopic     Status: Abnormal   Collection Time: 03/08/19 12:33 PM  Result Value Ref Range   Color, Urine YELLOW YELLOW   APPearance HAZY (A) CLEAR   Specific Gravity, Urine 1.006 1.005 - 1.030   pH 7.0 5.0 - 8.0   Glucose, UA NEGATIVE NEGATIVE mg/dL   Hgb urine dipstick SMALL (A) NEGATIVE   Bilirubin Urine NEGATIVE NEGATIVE   Ketones, ur NEGATIVE NEGATIVE mg/dL   Protein, ur 30 (A) NEGATIVE mg/dL   Nitrite NEGATIVE NEGATIVE   Leukocytes,Ua MODERATE (A) NEGATIVE   RBC / HPF 0-5 0 - 5 RBC/hpf   WBC,  UA 21-50 0 - 5 WBC/hpf   Bacteria, UA FEW (A) NONE SEEN    Comment: Performed at [facility], [place] [address] [place], [place] [address]  Type and screen Ordered by PROVIDER DEFAULT     Status: None (Preliminary result)   Collection Time: 03/08/19  1:18 PM  Result Value Ref Range   ABO/RH(D) PENDING    Antibody Screen PENDING    Sample Expiration      03/11/2019,2359 Performed at [facility], [place] [address] [place],  [address]     Dg Chest Port 1 View  Result Date: 03/08/2019 CLINICAL DATA:  Preoperative evaluation for femur surgery EXAM: PORTABLE CHEST 1 VIEW COMPARISON:  None. FINDINGS: Elevation of left hemidiaphragm is noted. No focal infiltrate or sizable effusion is seen. Cardiac shadow is at the upper limits of normal in size. Aortic calcifications are noted. IMPRESSION: No acute abnormality noted. Electronically Signed   By: Inez Catalina M.D.   On: 03/08/2019 11:07   Dg Knee Complete 4 Views Right  Result Date: 03/08/2019 CLINICAL DATA:  Pain following fall EXAM: RIGHT KNEE - COMPLETE 4+ VIEW COMPARISON:  None. FINDINGS: Frontal, lateral, and bilateral oblique views were obtained. There is a comminuted fracture of the  femur at the diaphysis-metaphysis junction. A fracture fragment extends parallel to the cortex to extend to the joint space just lateral to the midline of the distal femur. There is posterior and lateral displacement of the distal major fracture fragment with respect to the proximal major fracture fragment. No gross dislocation. No appreciable joint effusion. No appreciable joint space narrowing or erosion. IMPRESSION: Comminuted fracture distal femur with a fracture fragment extending to the level of the knee joint along the distal femoral condyle just lateral to the midline. There is posterior and lateral displacement of the major distal fracture fragment with respect to the major proximal fragment. No gross dislocation. Other visualized bony structures appear intact. Electronically Signed   By: Lowella Grip III M.D.   On: 03/08/2019 10:26    Review of Systems  Constitutional: Negative for chills, fever and weight loss.  Respiratory: Negative for cough and shortness of breath.   Cardiovascular: Negative for chest pain.  Gastrointestinal: Negative for heartburn, nausea and vomiting.  Musculoskeletal: Positive for falls.  Neurological: Negative for dizziness and loss of consciousness.   Blood pressure 140/68, pulse 65, temperature (!) 97 F (36.1 C), temperature source Oral, resp. rate 12, SpO2 94 %. Physical Exam  Constitutional: She is oriented to person, place, and time. She appears well-developed and well-nourished. No distress.  HENT:  Head: Normocephalic and atraumatic.  Eyes: Pupils are equal, round, and reactive to light. EOM are normal.  Cardiovascular: Normal rate.  Respiratory: Effort normal.  Musculoskeletal:     Comments: Edema and slight deformity right distal femur. Compartments soft bilatateral thighs and calves. Tenderness over right distal femur. Left lower extremity no gross deformities and non tender throughout. Dorsal pedal pulses intact bilateral. Sensation intact  bilateral feet.   Bilateral upper extremities full range of motion without pain .    Neurological: She is alert and oriented to person, place, and time.  Skin: Skin is warm and dry. She is not diaphoretic.  Psychiatric: She has a normal mood and affect.    Assessment/Plan: Closed comminuted Right distal femur fracture. Bucks traction right leg. Plan for open reduction internal fixation of the distal femur fracture later today.   Medical team treating hyperkalemia with calcium gluconate.  Non weight bearing right lower extremity. Strict bed rest.   NPO  Risk benefits of surgery discussed with patient by Dr. Magnus IvanBlackman and myself. Will precede with surgery in the near future.    GILBERT CLARK 03/08/2019, 1:53 PM

## 2019-03-08 NOTE — Transfer of Care (Signed)
Immediate Anesthesia Transfer of Care Note  Patient: Sarah Wolf  Procedure(s) Performed: OPEN REDUCTION INTERNAL FIXATION (ORIF) DISTAL FEMUR FRACTURE (Right )  Patient Location: PACU  Anesthesia Type:General  Level of Consciousness: awake, alert , oriented and patient cooperative  Airway & Oxygen Therapy: Patient Spontanous Breathing and Patient connected to face mask oxygen  Post-op Assessment: Report given to RN, Post -op Vital signs reviewed and stable and Patient moving all extremities X 4  Post vital signs: stable  Last Vitals:  Vitals Value Taken Time  BP 165/72 03/08/19 1800  Temp    Pulse 68 03/08/19 1802  Resp 15 03/08/19 1802  SpO2 100 % 03/08/19 1802  Vitals shown include unvalidated device data.  Last Pain:  Vitals:   03/08/19 1504  TempSrc:   PainSc: 10-Worst pain ever      Patients Stated Pain Goal: 6 (37/62/83 1517)  Complications: No apparent anesthesia complications

## 2019-03-08 NOTE — H&P (Signed)
History and Physical  Sarah Wolf TKZ:[id number] DOB: 1935-03-29 DOA: 03/08/2019  Referring physician: Dr. Vanita Panda PCP: Patient, No Pcp Per  Outpatient Specialists: None Patient coming from: Home  Chief Complaint: I tripped and fell  HPI: Sarah Wolf is a 84 y.o. female with medical history significant for no prior medical history, not taking any medication who presented after a mechanical fall at home earlier this morning.  States she was walking in her home she tripped on her own foot and fell on her right knee.  She was in her normal state of health prior to this.  Presented today via EMS due to significant right knee pain.  She lives alone and does not have any children.  She has a sister who has Parkinson's disease and a nephew who stays in contact with her.  She denies any cardiopulmonary, GI or other physical symptoms prior to this.  ED Course: Vital signs significant for elevated blood pressure in the setting of pain, improved after IV pain medication.  Lab studies remarkable for hypercalcemia with potassium of 5.8 and normal renal function.  Also remarkable for leukocytosis with WBC 15 K.  Orthopedic surgery consulted by EDP who requested [place] admission for this patient.  Review of Systems: Review of systems as noted in the HPI. All other systems reviewed and are negative.   History reviewed. No pertinent past medical history.  Social History:  has no history on file for tobacco, alcohol, and drug. She denies use of tobacco alcohol or drug abuse.  No Known Allergies  History reviewed. No pertinent family history.  Sister alive with Parkinson's disease.  Mother deceased at the age of 22 when she was 53 years old.  Father deceased at the age of 30 was killed by gunshot wound.  Prior to Admission medications   Medication Sig Start Date End Date Taking? Authorizing Provider  doxylamine, Sleep, (UNISOM) 25 MG tablet Take 25 mg by mouth at bedtime as needed for sleep.   Yes [provider]  naproxen sodium (ALEVE) 220 MG tablet Take 220 mg by mouth 2 (two) times daily as needed (pain/headache).   Yes [provider]    Physical Exam: BP 104/87   Pulse 90   Temp (!) 97 F (36.1 C) (Oral)   Resp 14   SpO2 98%   . General: 84 y.o. year-old female well developed well nourished in no acute distress.  Alert and oriented x4. . Cardiovascular: Regular rate and rhythm with no rubs or gallops.  No thyromegaly or JVD noted.  No lower extremity edema. 2/4 pulses in all 4 extremities. Marland Kitchen Respiratory: Clear to auscultation with no wheezes or rales. Good inspiratory effort. . Abdomen: Soft nontender nondistended with normal bowel sounds x4 quadrants. . Muskuloskeletal: No cyanosis, clubbing.  Right thigh edema noted.  2 out of 4 pulses in all 4 extremities bilaterally. . Neuro: CN II-XII intact, strength, sensation, reflexes . Skin: No ulcerative lesions noted or rashes . Psychiatry: Judgement and insight appear normal. Mood is appropriate for condition and setting          Labs on Admission:  Basic Metabolic Panel: Recent Labs  Lab 03/08/19 1044  NA 136  K 5.8*  CL 100  CO2 27  GLUCOSE 129*  BUN 17  CREATININE 0.87  CALCIUM 9.1   Liver Function Tests: No results for input(s): AST, ALT, ALKPHOS, BILITOT, PROT, ALBUMIN in the last 168 hours. No results for input(s): LIPASE, AMYLASE in the last 168 hours. No  results for input(s): AMMONIA in the last 168 hours. CBC: Recent Labs  Lab 03/08/19 1044  WBC 15.7*  NEUTROABS 13.9*  HGB 13.6  HCT 41.5  MCV 97.6  PLT 233   Cardiac Enzymes: No results for input(s): CKTOTAL, CKMB, CKMBINDEX, TROPONINI in the last 168 hours.  BNP (last 3 results) No results for input(s): BNP in the last 8760 hours.  ProBNP (last 3 results) No results for input(s): PROBNP in the last 8760 hours.  CBG: No results for input(s): GLUCAP in the last 168 hours.  Radiological Exams on Admission: Dg Chest Port 1 View   Result Date: 03/08/2019 CLINICAL DATA:  Preoperative evaluation for femur surgery EXAM: PORTABLE CHEST 1 VIEW COMPARISON:  None. FINDINGS: Elevation of left hemidiaphragm is noted. No focal infiltrate or sizable effusion is seen. Cardiac shadow is at the upper limits of normal in size. Aortic calcifications are noted. IMPRESSION: No acute abnormality noted. Electronically Signed   By: Alcide CleverMark  Lukens M.D.   On: 03/08/2019 11:07   Dg Knee Complete 4 Views Right  Result Date: 03/08/2019 CLINICAL DATA:  Pain following fall EXAM: RIGHT KNEE - COMPLETE 4+ VIEW COMPARISON:  None. FINDINGS: Frontal, lateral, and bilateral oblique views were obtained. There is a comminuted fracture of the femur at the diaphysis-metaphysis junction. A fracture fragment extends parallel to the cortex to extend to the joint space just lateral to the midline of the distal femur. There is posterior and lateral displacement of the distal major fracture fragment with respect to the proximal major fracture fragment. No gross dislocation. No appreciable joint effusion. No appreciable joint space narrowing or erosion. IMPRESSION: Comminuted fracture distal femur with a fracture fragment extending to the level of the knee joint along the distal femoral condyle just lateral to the midline. There is posterior and lateral displacement of the major distal fracture fragment with respect to the major proximal fragment. No gross dislocation. Other visualized bony structures appear intact. Electronically Signed   By: Bretta BangWilliam  Woodruff III M.D.   On: 03/08/2019 10:26    EKG: I independently viewed the EKG done and my findings are as followed: Sinus arrhythmia with rate of 64.  No specific ST-T changes.  QTc 455.  Assessment/Plan Present on Admission: . Hip fracture (HCC)  Active Problems:   Hip fracture (HCC)  Acute right distal comminuted femur fracture Presented after a mechanical fall this morning Was in her usual state of health prior to  this fall Optimize pain control Orthopedic surgery has been contacted by EDP Possible OR for repair CSW contacted to assist with placement for possible SNF/rehab post surgery  Leukocytosis, likely reactive Chest x-ray unremarkable for any acute findings UA pending Afebrile No sign of active infective process  Hyperkalemia in the setting of acute femoral fracture Presented with potassium 5.8 Ordered IV calcium gluconate 1 g once Repeat BMP and treat if persistent or worse Start lactated ringer at 75 cc/h x 1 day  Elevated blood pressure in the setting of severe pain post femoral fracture No prior history of hypertension Optimize pain control Closely monitor vital signs  Risks: Patient is at high risk for decompensation due to acute right distal femoral fracture requiring surgical intervention and advanced age.  She will require at least 2 midnights for further evaluation and treatment of current medical condition.   DVT prophylaxis: SCDs for now due to possible procedure in the OR.  Defer anticoagulant/chemical DVT prophylaxis post procedure to orthopedic surgery.  Code Status: Full code as stated by  the patient herself.  She is alert and oriented x4.  Family Communication: None at bedside.  Disposition Plan: Admit to telemetry unit.  Consults called: Orthopedic surgery contacted by EDP.  Admission status: Inpatient status.    Darlin Droparole N Linh Johannes MD [facility] Pager [phone]  If 7PM-7AM, please contact night-coverage [email or web address] Password [email or web address]  03/08/2019, 12:18 PM

## 2019-03-08 NOTE — ED Notes (Signed)
Bud Robers (nephew) [phone] please call and give update if possible.

## 2019-03-08 NOTE — ED Provider Notes (Signed)
[facility]-EMERGENCY DEPT Provider Note   CSN: [id number] Arrival date & time: 03/08/19  [id number]     History   Chief Complaint Chief Complaint  Patient presents with  . Fall  . Knee Pain    HPI Sarah Wolf is a 84 y.o. female.     HPI Patient presents after mechanical fall with pain in her left knee. Patient states that she is generally well, does not see physicians regularly, and was in her usual state of health. This morning, just prior to EMS notification the patient was walking, when her feet got caught and she fell onto her left knee. Since that time she has been unable to ambulate, bear weight secondary to severe pain in the knee. Pain is diffuse, worse with any attempted motion of the leg. She denies trauma to other areas, pain in other areas. She has had mild relief with fentanyl, 1 5 0 mcg provided in route by EMS.   History reviewed. No pertinent past medical history.  Patient Active Problem List   Diagnosis Date Noted  . Hip fracture (HCC) 03/08/2019  . Closed comminuted intra-articular fracture of distal femur, right, initial encounter (HCC)     History reviewed. No pertinent surgical history.   OB History   No obstetric history on file.      Home Medications    Prior to Admission medications   Medication Sig Start Date End Date Taking? Authorizing Provider  doxylamine, Sleep, (UNISOM) 25 MG tablet Take 25 mg by mouth at bedtime as needed for sleep.   Yes [provider]  naproxen sodium (ALEVE) 220 MG tablet Take 220 mg by mouth 2 (two) times daily as needed (pain/headache).   Yes [provider]    Family History History reviewed. No pertinent family history.  Social History Social History   Tobacco Use  . Smoking status: Never Smoker  . Smokeless tobacco: Never Used  Substance Use Topics  . Alcohol use: Not on file  . Drug use: Not on file     Allergies   Patient has no known allergies.    Review of Systems Review of Systems  Constitutional:       Per HPI, otherwise negative  HENT:       Per HPI, otherwise negative  Respiratory:       Per HPI, otherwise negative  Cardiovascular:       Per HPI, otherwise negative  Gastrointestinal: Negative for vomiting.  Endocrine:       Negative aside from HPI  Genitourinary:       Neg aside from HPI   Musculoskeletal:       Per HPI, otherwise negative  Skin: Negative.   Neurological: Negative for syncope.     Physical Exam Updated Vital Signs BP (!) 168/78   Pulse 79   Temp 98.5 F (36.9 C) (Oral)   Resp 18   Ht 5\' 6"  (1.676 m)   Wt 63.5 kg   SpO2 99%   BMI 22.60 kg/m   Physical Exam Vitals signs and nursing note reviewed.  Constitutional:      General: She is not in acute distress.    Appearance: She is well-developed.  HENT:     Head: Normocephalic and atraumatic.  Eyes:     Conjunctiva/sclera: Conjunctivae normal.  Cardiovascular:     Rate and Rhythm: Normal rate and regular rhythm.  Pulmonary:     Effort: Pulmonary effort is normal. No respiratory distress.     Breath  sounds: Normal breath sounds. No stridor.  Abdominal:     General: There is no distension.  Musculoskeletal:     Right hip: Normal.     Right ankle: Normal.       Legs:  Skin:    General: Skin is warm and dry.  Neurological:     Mental Status: She is alert and oriented to person, place, and time.     Cranial Nerves: No cranial nerve deficit.      ED Treatments / Results  Labs (all labs ordered are listed, but only abnormal results are displayed) Labs Reviewed  BASIC METABOLIC PANEL - Abnormal; Notable for the following components:      Result Value   Potassium 5.8 (*)    Glucose, Bld 129 (*)    All other components within normal limits  CBC WITH DIFFERENTIAL/PLATELET - Abnormal; Notable for the following components:   WBC 15.7 (*)    Neutro Abs 13.9 (*)    All other components within normal limits  BASIC METABOLIC PANEL  - Abnormal; Notable for the following components:   Glucose, Bld 124 (*)    All other components within normal limits  URINALYSIS, ROUTINE W REFLEX MICROSCOPIC - Abnormal; Notable for the following components:   APPearance HAZY (*)    Hgb urine dipstick SMALL (*)    Protein, ur 30 (*)    Leukocytes,Ua MODERATE (*)    Bacteria, UA FEW (*)    All other components within normal limits  SARS CORONAVIRUS 2 (HOSPITAL ORDER, [place] [facility])  SURGICAL PCR SCREEN  PROTIME-INR  TYPE AND SCREEN  ABO/RH    Radiology Dg Chest Port 1 View  Result Date: 03/08/2019 CLINICAL DATA:  Preoperative evaluation for femur surgery EXAM: PORTABLE CHEST 1 VIEW COMPARISON:  None. FINDINGS: Elevation of left hemidiaphragm is noted. No focal infiltrate or sizable effusion is seen. Cardiac shadow is at the upper limits of normal in size. Aortic calcifications are noted. IMPRESSION: No acute abnormality noted. Electronically Signed   By: Inez Catalina M.D.   On: 03/08/2019 11:07   Dg Knee Complete 4 Views Right  Result Date: 03/08/2019 CLINICAL DATA:  Pain following fall EXAM: RIGHT KNEE - COMPLETE 4+ VIEW COMPARISON:  None. FINDINGS: Frontal, lateral, and bilateral oblique views were obtained. There is a comminuted fracture of the femur at the diaphysis-metaphysis junction. A fracture fragment extends parallel to the cortex to extend to the joint space just lateral to the midline of the distal femur. There is posterior and lateral displacement of the distal major fracture fragment with respect to the proximal major fracture fragment. No gross dislocation. No appreciable joint effusion. No appreciable joint space narrowing or erosion. IMPRESSION: Comminuted fracture distal femur with a fracture fragment extending to the level of the knee joint along the distal femoral condyle just lateral to the midline. There is posterior and lateral displacement of the major distal fracture fragment with respect to  the major proximal fragment. No gross dislocation. Other visualized bony structures appear intact. Electronically Signed   By: Lowella Grip III M.D.   On: 03/08/2019 10:26    Procedures Procedures (including critical care time)  Medications Ordered in ED Medications  morphine 2 MG/ML injection 2 mg ( Intravenous MAR Hold 03/08/19 1450)  oxyCODONE (Oxy IR/ROXICODONE) immediate release tablet 5 mg ( Oral MAR Hold 03/08/19 1450)  traMADol (ULTRAM) tablet 50 mg ( Oral MAR Hold 03/08/19 1450)  senna-docusate (Senokot-S) tablet 2 tablet ( Oral Automatically Held  03/16/19 2200)  ondansetron (ZOFRAN) injection 4 mg ( Intravenous MAR Hold 03/08/19 1450)  acetaminophen (TYLENOL) tablet 650 mg ( Oral MAR Hold 03/08/19 1450)  hydrALAZINE (APRESOLINE) injection 5 mg ( Intravenous MAR Hold 03/08/19 1450)  lactated ringers infusion (has no administration in time range)  ceFAZolin (ANCEF) IVPB 2g/100 mL premix (has no administration in time range)  ceFAZolin (ANCEF) 2-4 GM/100ML-% IVPB (has no administration in time range)  tranexamic acid (CYKLOKAPRON) 1000MG /12100mL IVPB (has no administration in time range)  morphine 4 MG/ML injection 4 mg (4 mg Intravenous Given 03/08/19 1044)  calcium gluconate 1 g/ 50 mL sodium chloride IVPB (0 g Intravenous Stopped 03/08/19 1427)  fentaNYL (SUBLIMAZE) 250 MCG/5ML injection (has no administration in time range)     Initial Impression / Assessment and Plan / ED Course  I have reviewed the triage vital signs and the nursing notes.  Pertinent labs & imaging results that were available during my care of the patient were reviewed by me and considered in my medical decision making (see chart for details).    Update: Patient aware of all findings, remains in substantial pain.  Update: Patient is being placed in traction, with the assistance of orthopedic technician. This elderly female presents after mechanical fall.  Patient found to have a distal femur fracture  requiring immobilization, likely surgical repair. After I come considered her case with our orthopedic colleagues, I discussed it with our internal medicine colleagues as well, the patient was admitted for definitive care.  Final Clinical Impressions(s) / ED Diagnoses   Final diagnoses:  Fall, initial encounter  Closed displaced supracondylar fracture of distal end of left femur with intracondylar extension, initial encounter [facility](HCC)     Gerhard MunchLockwood, Timber Lucarelli, MD 03/08/19 1536

## 2019-03-08 NOTE — Anesthesia Postprocedure Evaluation (Signed)
Anesthesia Post Note  Patient: Sarah Wolf  Procedure(s) Performed: OPEN REDUCTION INTERNAL FIXATION (ORIF) DISTAL FEMUR FRACTURE (Right )     Patient location during evaluation: PACU Anesthesia Type: General Level of consciousness: awake and alert Pain management: pain level controlled Vital Signs Assessment: post-procedure vital signs reviewed and stable Respiratory status: spontaneous breathing, nonlabored ventilation, respiratory function stable and patient connected to nasal cannula oxygen Cardiovascular status: blood pressure returned to baseline and stable Postop Assessment: no apparent nausea or vomiting Anesthetic complications: no Comments: Tachy episodes intraop. No issues in PACU. Cannot rule out aspiration during induction per CRNA but O2 sats stable, unlabored breathing.     Last Vitals:  Vitals:   03/08/19 1945 03/08/19 2000  BP: (!) 148/60 (!) 153/63  Pulse: (!) 57 60  Resp: 12 12  Temp:  36.9 C  SpO2: 98% 94%                   Effie Berkshire

## 2019-03-08 NOTE — Anesthesia Procedure Notes (Addendum)
Procedure Name: Intubation Date/Time: 03/08/2019 3:44 PM Performed by: Lissa Morales, CRNA Pre-anesthesia Checklist: Patient identified, Emergency Drugs available, Suction available and Patient being monitored Patient Re-evaluated:Patient Re-evaluated prior to induction Oxygen Delivery Method: Circle system utilized Preoxygenation: Pre-oxygenation with 100% oxygen Induction Type: IV induction Ventilation: Mask ventilation without difficulty Laryngoscope Size: Miller and 2 Tube type: Oral Tube size: 7.5 mm Number of attempts: 1 Airway Equipment and Method: Stylet and Oral airway Placement Confirmation: ETT inserted through vocal cords under direct vision,  positive ETCO2 and breath sounds checked- equal and bilateral Secured at: 21 cm Tube secured with: Tape Dental Injury: Teeth and Oropharynx as per pre-operative assessment  Comments: On induction with DL,dark reddish coffee ground colored fluid in back of throat. Suctioned out prior to intubation.  OG placed after industioned and ~ 250cc's pf coffeeground fluid suctioned from stomach.

## 2019-03-08 NOTE — Anesthesia Preprocedure Evaluation (Signed)
Anesthesia Evaluation  Patient identified by MRN, date of birth, ID band Patient awake    Reviewed: Allergy & Precautions, NPO status , Patient's Chart, lab work & pertinent test results  Airway Mallampati: II  TM Distance: >3 FB Neck ROM: Full    Dental no notable dental hx. (+) Edentulous Upper, Edentulous Lower   Pulmonary neg pulmonary ROS,    Pulmonary exam normal breath sounds clear to auscultation       Cardiovascular negative cardio ROS Normal cardiovascular exam Rhythm:Regular Rate:Normal     Neuro/Psych negative neurological ROS  negative psych ROS   GI/Hepatic negative GI ROS, Neg liver ROS,   Endo/Other  negative endocrine ROS  Renal/GU negative Renal ROS  negative genitourinary   Musculoskeletal negative musculoskeletal ROS (+)   Abdominal   Peds negative pediatric ROS (+)  Hematology negative hematology ROS (+)   Anesthesia Other Findings   Reproductive/Obstetrics negative OB ROS                             Anesthesia Physical Anesthesia Plan  ASA: II  Anesthesia Plan: General   Post-op Pain Management:    Induction: Intravenous  PONV Risk Score and Plan: 3 and Ondansetron and Treatment may vary due to age or medical condition  Airway Management Planned: Oral ETT  Additional Equipment:   Intra-op Plan:   Post-operative Plan: Extubation in OR  Informed Consent: I have reviewed the patients History and Physical, chart, labs and discussed the procedure including the risks, benefits and alternatives for the proposed anesthesia with the patient or authorized representative who has indicated his/her understanding and acceptance.     Dental advisory given  Plan Discussed with: CRNA  Anesthesia Plan Comments:         Anesthesia Quick Evaluation

## 2019-03-09 ENCOUNTER — Inpatient Hospital Stay (HOSPITAL_COMMUNITY): Payer: Medicare Other

## 2019-03-09 DIAGNOSIS — R9431 Abnormal electrocardiogram [ECG] [EKG]: Secondary | ICD-10-CM

## 2019-03-09 LAB — ECHOCARDIOGRAM COMPLETE
Height: 66 in
Weight: 2240 oz

## 2019-03-09 MED ORDER — CARVEDILOL 3.125 MG PO TABS
3.1250 mg | ORAL_TABLET | Freq: Two times a day (BID) | ORAL | Status: DC
  Administered 2019-03-09 – 2019-03-12 (×7): 3.125 mg via ORAL
  Filled 2019-03-09 (×7): qty 1

## 2019-03-09 NOTE — Op Note (Signed)
NAMEILARIA, MUCH MEDICAL RECORD HQ:[id number] ACCOUNT [id number] DATE OF BIRTH:1935/04/30 FACILITY: [facility] LOCATION: [facility] PHYSICIAN:Cherisse Carrell Kerry Fort, MD  OPERATIVE REPORT  DATE OF PROCEDURE:  03/08/2019  PREOPERATIVE DIAGNOSIS:  Right intraarticular distal femur fracture.  POSTOPERATIVE DIAGNOSIS:  Right intraarticular distal femur fracture.  PROCEDURE:  Open reduction internal fixation of right intraarticular distal femur fracture using a periarticular plate and screws.  IMPLANTS:  [employer] large periarticular right 9-hole plate with a combination of bicortical screws and locking screws.  SURGEON:  Lind Guest. Ninfa Linden, MD  ASSISTANT:  Erskine Emery, PA-C  ANESTHESIA:  General.  ANTIBIOTICS:  Two grams IV Ancef.  ESTIMATED BLOOD LOSS:  200 mL.  COMPLICATIONS:  None.  INDICATIONS:  The patient is an 84 year old female who lives alone, who sustained an accidental mechanical fall in her house today when she slipped on a rug.  She had the inability to ambulate and severe right knee pain.  She was brought to the [facility] Emergency Room by EMS.  She was found to have a distal femur fracture of the left femur that was intraarticular.  She was seen and graciously admitted to the medicine service for preoperative clearance and medical management.  She has not seen a  physician in 15 years and reports that she is healthy.  She seems to be still a moderate to high surgical risk, but this needs to be done based on the nature of this fracture.  This is for certainly quality of life and pain control purposes and mobility  purposes.  I had a long and thorough discussion with her about the risks of surgery including perioperative cardiac risks.  We talked about the risk of acute blood loss anemia, nerve and vessel injury, failure of fixation and nonunion and implant  failure.  We talked about the goals being decreased pain, improved mobility, and overall improved quality of  life.  DESCRIPTION OF PROCEDURE:  After informed consent was obtained and appropriate right distal femur was marked, she was brought to the operating room and general anesthesia was obtained while she was in a supine position on her stretcher.  She was then  placed supine on the operative table.  Of note, anesthesia did let us know that she did have some runs of atrial fibrillation as they were inducing her.  During the case, she was slightly hypotensive and did require some ____, but nothing seemed  otherwise out of the ordinary for someone her age.  They did feel comfortable with proceeding with surgery.  Her right thigh, knee, leg, and ankle were prepped and draped with DuraPrep and sterile drapes, including a sterile stockinette.  Time-out was  called, and she was identified as correct patient and correct right distal femur.  We then made a lateral incision from the distal femur, carrying this proximally.  We dissected down to the IT band.  The IT band was then divided longitudinally so we  could assess the fracture.  We were able to then as anatomically as possible reduce the fracture, and this was under direct visualization and direct fluoroscopy.  Once we had the fracture reduced and we irrigated the knee joint, we chose our 9-hole  periarticular right lateral plate from [employer].  We were able to secure this temporarily with K wires and confirm the fracture reduction under direct fluoroscopy.  We then placed bicortical screws proximally and locking screws distally to secure  the fixation.  We then put the knee through range of motion and assessed  the leg under direct fluoroscopy, and we were pleased with the reduction.  We then irrigated the soft tissue with normal saline solution.  We dried the knee real well, and then we  closed the IT band with interrupted #1 Vicryl suture, followed by closing the deep tissue with 0 Vicryl, 2-0 Vicryl was used to close the subcutaneous tissue, and  interrupted staples were placed on the skin.  A well-padded sterile dressing was applied.   She was awakened, extubated, and taken to the recovery room in guarded condition with the idea that she would be admitted to telemetry postoperatively.  Of note, Rexene EdisonGil Clark, PA-C, assisted during the entire case and his assistance was crucial for  facilitating all aspects of this case.  LN/NUANCE  D:03/08/2019 T:03/08/2019 JOB:[id number]

## 2019-03-09 NOTE — Progress Notes (Signed)
Subjective: 1 Day Post-Op Procedure(s) (LRB): OPEN REDUCTION INTERNAL FIXATION (ORIF) DISTAL FEMUR FRACTURE (Right) Patient reports pain as mild.  No complaints.   Objective: Vital signs in last 24 hours: Temp:  [97.8 F (36.6 C)-98.6 F (37 C)] 98.6 F (37 C) (08/22 0553) Pulse Rate:  [51-90] 67 (08/22 0553) Resp:  [12-20] 16 (08/22 0553) BP: (102-172)/(51-87) 154/62 (08/22 0553) SpO2:  [85 %-100 %] 97 % (08/22 0553) Weight:  [63.5 kg] 63.5 kg (08/21 1504)  Intake/Output from previous day: 08/21 0701 - 08/22 0700 In: 2927.8 [P.O.:480; I.V.:2040.9; IV Piggyback:406.9] Out: 8841 [YSAYT:0160; Blood:200] Intake/Output this shift: No intake/output data recorded.  Recent Labs    03/08/19 1044  HGB 13.6   Recent Labs    03/08/19 1044  WBC 15.7*  RBC 4.25  HCT 41.5  PLT 233   Recent Labs    03/08/19 1044 03/08/19 1318  NA 136 138  K 5.8* 3.9  CL 100 101  CO2 27 28  BUN 17 18  CREATININE 0.87 0.69  GLUCOSE 129* 124*  CALCIUM 9.1 9.4   Recent Labs    03/08/19 1044  INR 1.0   Right lowe extremity: Sensation intact distally Intact pulses distally Incision: dressing C/D/I Compartment soft   Assessment/Plan: 1 Day Post-Op Procedure(s) (LRB): OPEN REDUCTION INTERNAL FIXATION (ORIF) DISTAL FEMUR FRACTURE (Right)   Up with therapy non weight bearing right lower extremity.   Labs pending      Erskine Emery 03/09/2019, 9:21 AM

## 2019-03-09 NOTE — Progress Notes (Signed)
PROGRESS NOTE  Sarah Wolf  DOB: 11-15-1934  PCP: Patient, No Pcp Per UJW:[id number]  DOA: 03/08/2019  LOS: 1 day   Brief narrative: Sarah Wolf is a 84 y.o. female with no significant past medical history who presented to the ED on 8/21 after a mechanical fall.  She apparently slipped on a rug at home and fell hitting the right side of her body.    In the ED, she was hemodynamically stable.   Blood work showed potassium level elevated 5.8, WBC count elevated to 15.7. Urinalysis showed hazy yellow urine with moderate amount of leukocytes and few bacteria. Chest x-ray did not show any acute abnormality. X-ray of right knee showed comminuted fracture distal femur with a fracture fragment extending to the level of the knee joint along the distal femoral condyle just lateral to the midline. There is posterior and lateral displacement of the major distal fracture fragment with respect to the major proximal fragment. No gross dislocation. Other visualized bony structures appear intact. Orthopedic surgery was consulted.  Patient was admitted under hospitalist service for further evaluation and management. Patient underwent ORIF of right intra-articular distal femur fracture the same day.  Subjective: Patient was seen and examined this morning.  Pleasant elderly Caucasian female.  Lying on bed.  Not in distress.  On low-flow oxygen by nasal cannula.  Pain controlled.  Has a Foley catheter on.  Assessment/Plan: Acute right distal comminuted femur fracture -Secondary to mechanical fall.   -Underwent ORIF on 8/21.   -PT eval NWB RLE, Foley out, discharge planning. -Currently on SCD boots for DVT prophylaxis. -Pain control.  Leukocytosis -WBC count was elevated 15.7 on admission, likely reactive. -Chest x-ray normal, urinalysis without evidence of infection.  No fever. -Repeat CBC tomorrow.  Chest x-ray unremarkable for any acute findings  Intermittent tachycardia Elevated blood pressure  -Patient had bursts of unprovoked tachycardia up to 160 this morning. -No history of hypertension or any other cardiac issues in the past. -Obtain echocardiogram. -Since her blood pressure is also running on the higher side of normal, will start on Coreg 3.125 mg twice daily. -Monitor telemetry.  Monitor blood pressure.  Hyperkalemia -Potassium elevated to 5.8 on admission.  1 dose of IV calcium gluconate was given. -Repeat potassium normal this morning.  Mobility: PT eval Diet: Cardiac diet DVT prophylaxis:  SCD boots Code Status:   Code Status: Full Code  Family Communication:  Expected Discharge:  Pending PT eval, may need placement.  Lives at home by herself.  Consultants:  Orthopedics  Procedures:  ORIF 8/21  Antimicrobials: Anti-infectives (From admission, onward)   Start     Dose/Rate Route Frequency Ordered Stop   03/09/19 0000  ceFAZolin (ANCEF) IVPB 2g/100 mL premix     2 g 200 mL/hr over 30 Minutes Intravenous Every 6 hours 03/08/19 2009 03/09/19 0612   03/08/19 1530  ceFAZolin (ANCEF) IVPB 2g/100 mL premix     2 g 200 mL/hr over 30 Minutes Intravenous  Once 03/08/19 1516 03/08/19 1537   03/08/19 1516  ceFAZolin (ANCEF) 2-4 GM/100ML-% IVPB    Note to Pharmacy: Augustine RadarGaines, Janel   : cabinet override      03/08/19 1516 03/09/19 0329      Infusions:  . lactated ringers Stopped (03/09/19 0542)  . methocarbamol (ROBAXIN) IV 500 mg (03/08/19 1802)    Scheduled Meds: . aspirin EC  325 mg Oral Q breakfast  . docusate sodium  100 mg Oral BID  . pantoprazole  40 mg Oral Daily  .  senna-docusate  2 tablet Oral QHS    PRN meds: acetaminophen, hydrALAZINE, HYDROcodone-acetaminophen, HYDROcodone-acetaminophen, menthol-cetylpyridinium **OR** phenol, methocarbamol **OR** methocarbamol (ROBAXIN) IV, metoCLOPramide **OR** metoCLOPramide (REGLAN) injection, morphine injection, ondansetron (ZOFRAN) IV, ondansetron **OR** ondansetron (ZOFRAN) IV, traMADol   Objective:  Vitals:   03/09/19 0553 03/09/19 1002  BP: (!) 154/62 (!) 153/85  Pulse: 67 89  Resp: 16 16  Temp: 98.6 F (37 C) 98.4 F (36.9 C)  SpO2: 97% 90%    Intake/Output Summary (Last 24 hours) at 03/09/2019 1244 Last data filed at 03/09/2019 1135 Gross per 24 hour  Intake 2947.82 ml  Output 2375 ml  Net 572.82 ml   Filed Weights   03/08/19 1504  Weight: 63.5 kg   Weight change:  Body mass index is 22.6 kg/m.   Physical Exam: General exam: Appears calm and comfortable.  Skin: No rashes, lesions or ulcers. HEENT: Atraumatic, normocephalic, supple neck, no obvious bleeding Lungs: Clear to auscultation bilaterally CVS: Regular rate and rhythm, no murmur GI/Abd soft, nontender, nondistended, bowel sound present CNS: Alert, awake, oriented x3 Psychiatry: Mood appropriate Extremities: No pedal edema, no calf tenderness  Data Review: I have personally reviewed the laboratory data and studies available.  Recent Labs  Lab 03/08/19 1044  WBC 15.7*  NEUTROABS 13.9*  HGB 13.6  HCT 41.5  MCV 97.6  PLT 233   Recent Labs  Lab 03/08/19 1044 03/08/19 1318  NA 136 138  K 5.8* 3.9  CL 100 101  CO2 27 28  GLUCOSE 129* 124*  BUN 17 18  CREATININE 0.87 0.69  CALCIUM 9.1 9.4    Terrilee Croak, MD  [facility] 03/09/2019

## 2019-03-09 NOTE — Progress Notes (Signed)
*  PRELIMINARY RESULTS* Echocardiogram 2D Echocardiogram has been performed.  Sarah Wolf 03/09/2019, 3:17 PM

## 2019-03-09 NOTE — Progress Notes (Signed)
PT Note  Patient Details Name: Versa Craton MRN: [id number] DOB: Apr 15, 1935   Ortho please clarify if you would like ROM of R LE and/or a KI.   Thank you  Clide Dales 03/09/2019, 1:20 PM  Clide Dales, PT Acute Rehabilitation Services Pager: [phone] Office: [phone] 03/09/2019

## 2019-03-09 NOTE — Evaluation (Signed)
Physical Therapy Evaluation Patient Details Name: Sarah Wolf MRN: [id number] DOB: 01-11-1935 Today's Date: 03/09/2019   History of Present Illness  pt admit 03/08/2019 after falling at home in the morning in her living room. s/p R distal femur fracture ORIF, NWB RLE  Clinical Impression  Pt s/p R distal femur ORIF presents with decreased ability for all mobility and increased pain limiting mobility also due to now NWB on RLE. Will continue to follow to work on transfers, short gait, and other safe mobility while here. Recommend SNF at this time due to difficulties to manage this home alone at this time.     Follow Up Recommendations SNF    Equipment Recommendations  Rolling walker with 5" wheels;Wheelchair (measurements PT)(or get at next venue)    Recommendations for Other Services       Precautions / Restrictions Precautions Precaution Comments: aksing MD to clarify if he wants ROM in RLE or a KI Restrictions Weight Bearing Restrictions: Yes RLE Weight Bearing: Non weight bearing      Mobility  Bed Mobility Overal bed mobility: Needs Assistance Bed Mobility: Supine to Sit     Supine to sit: Mod assist;+2 for physical assistance     General bed mobility comments: one person for upper body and the other to completely cradle the RLE for support. Pt was unable to dangle the R LE at EOB so it was manually cradled for support and comfort.  Transfers Overall transfer level: Needs assistance Equipment used: Rolling walker (2 wheeled) Transfers: Sit to/from [facility] Sit to Stand: Mod assist;+2 physical assistance Stand pivot transfers: Mod assist;+2 physical assistance       General transfer comment: again had to cradle and hold the RLE for support to minimize pain. Pt was able to stand and support herself at RW and creat a pivot on the LLE for trasnfer to recliner.  Ambulation/Gait                Stairs            Wheelchair Mobility     Modified Rankin (Stroke Patients Only)       Balance                                             Pertinent Vitals/Pain Pain Assessment: 0-10 Pain Score: 7  Pain Location: R knee and posterior knee during any mobility. May benefit from a KI to give the knee some extra support while moving. Pain Descriptors / Indicators: Aching;Shooting;Sore Pain Intervention(s): Monitored during session;Repositioned;Ice applied    Home Living Family/patient expects to be discharged to:: Private residence Living Arrangements: Alone Available Help at Discharge: Available PRN/intermittently Type of Home: House Home Access: Stairs to enter   Technical brewer of Steps: 1-2 Home Layout: One level        Prior Function Level of Independence: Independent         Comments: very independent drives and active     Hand Dominance        Extremity/Trunk Assessment        Lower Extremity Assessment Lower Extremity Assessment: Overall WFL for tasks assessed;RLE deficits/detail RLE Deficits / Details: NT due to R LE surgery ORIF of dital femur ( kept knee straight during session and held for any movement)       Communication   Communication: No difficulties  Cognition Arousal/Alertness:  Awake/alert Behavior During Therapy: WFL for tasks assessed/performed Overall Cognitive Status: Within Functional Limits for tasks assessed                                        General Comments      Exercises     Assessment/Plan    PT Assessment Patient needs continued PT services  PT Problem List Decreased strength;Decreased activity tolerance;Decreased balance;Decreased mobility       PT Treatment Interventions Gait training;Functional mobility training;Therapeutic activities;Therapeutic exercise    PT Goals (Current goals can be found in the Care Plan section)  Acute Rehab PT Goals Patient Stated Goal: I can't believe I did this!! I want to be  independent and do for myself !!! PT Goal Formulation: With patient Time For Goal Achievement: 03/22/19 Potential to Achieve Goals: Good    Frequency Min 3X/week   Barriers to discharge Decreased caregiver support(lives home alone will be difficult to manage with NWB on RLE)      Co-evaluation               AM-PAC PT "6 Clicks" Mobility  Outcome Measure Help needed turning from your back to your side while in a flat bed without using bedrails?: A Lot Help needed moving from lying on your back to sitting on the side of a flat bed without using bedrails?: A Lot Help needed moving to and from a bed to a chair (including a wheelchair)?: A Lot Help needed standing up from a chair using your arms (e.g., wheelchair or bedside chair)?: A Lot Help needed to walk in hospital room?: Total Help needed climbing 3-5 steps with a railing? : Total 6 Click Score: 10    End of Session Equipment Utilized During Treatment: Gait belt Activity Tolerance: Patient tolerated treatment well Patient left: in chair;with call bell/phone within reach;with chair alarm set Nurse Communication: Mobility status PT Visit Diagnosis: Other abnormalities of gait and mobility (R26.89)    Time: 1330-1406 PT Time Calculation (min) (ACUTE ONLY): 36 min   Charges:   PT Evaluation $PT Eval Moderate Complexity: 1 Mod PT Treatments $Therapeutic Activity: 23-37 mins        Marella BileSharron Shundra Wirsing, PT Acute Rehabilitation Services Pager: [phone] Office: [phone] 03/09/2019   Marella BileBRITT, Carmencita Cusic 03/09/2019, 5:20 PM

## 2019-03-09 NOTE — Plan of Care (Signed)
  Problem: Education: Goal: Verbalization of understanding the information provided (i.e., activity precautions, restrictions, etc) will improve Outcome: Progressing   Problem: Activity: Goal: Ability to ambulate and perform ADLs will improve Outcome: Progressing   Problem: Clinical Measurements: Goal: Postoperative complications will be avoided or minimized Outcome: Progressing   Problem: Self-Concept: Goal: Ability to maintain and perform role responsibilities to the fullest extent possible will improve Outcome: Progressing   Problem: Pain Management: Goal: Pain level will decrease Outcome: Progressing   Problem: Education: Goal: Knowledge of General Education information will improve Description: Including pain rating scale, medication(s)/side effects and non-pharmacologic comfort measures Outcome: Progressing   Problem: Health Behavior/Discharge Planning: Goal: Ability to manage health-related needs will improve Outcome: Progressing   Problem: Clinical Measurements: Goal: Ability to maintain clinical measurements within normal limits will improve Outcome: Progressing Goal: Will remain free from infection Outcome: Progressing Goal: Diagnostic test results will improve Outcome: Progressing Goal: Respiratory complications will improve Outcome: Progressing Goal: Cardiovascular complication will be avoided Outcome: Progressing   Problem: Activity: Goal: Risk for activity intolerance will decrease Outcome: Progressing   Problem: Nutrition: Goal: Adequate nutrition will be maintained Outcome: Progressing   Problem: Coping: Goal: Level of anxiety will decrease Outcome: Progressing   Problem: Pain Managment: Goal: General experience of comfort will improve Outcome: Progressing   Problem: Safety: Goal: Ability to remain free from injury will improve Outcome: Progressing   Problem: Skin Integrity: Goal: Risk for impaired skin integrity will decrease Outcome:  Progressing

## 2019-03-09 NOTE — Progress Notes (Signed)
Pt's HR currently NSR in 80-90s, but very frequently (multiple times so far this shift) HR will tachy up to 150-160s for several seconds before lowering immediately back to 80-90s. Remains in sinus rhythm. Pt asymptomatic each time. EKG done x2 in an attempt to catch tachycardia. MD Dahal made aware- per MD, will order Echo and metoprolol. Will continue to monitor closely.

## 2019-03-10 LAB — RETICULOCYTES
Immature Retic Fract: 9.8 % (ref 2.3–15.9)
RBC.: 3.54 MIL/uL — ABNORMAL LOW (ref 3.87–5.11)
Retic Count, Absolute: 67.6 10*3/uL (ref 19.0–186.0)
Retic Ct Pct: 1.9 % (ref 0.4–3.1)

## 2019-03-10 LAB — URINALYSIS, ROUTINE W REFLEX MICROSCOPIC
Bilirubin Urine: NEGATIVE
Glucose, UA: NEGATIVE mg/dL
Ketones, ur: NEGATIVE mg/dL
Nitrite: NEGATIVE
Protein, ur: NEGATIVE mg/dL
Specific Gravity, Urine: 1.006 (ref 1.005–1.030)
WBC, UA: >50 WBC/hpf — ABNORMAL HIGH (ref 0–5)
pH: 6 (ref 5.0–8.0)

## 2019-03-10 LAB — BASIC METABOLIC PANEL
Anion gap: 9 (ref 5–15)
BUN: 17 mg/dL (ref 8–23)
CO2: 27 mmol/L (ref 22–32)
Calcium: 8.7 mg/dL — ABNORMAL LOW (ref 8.9–10.3)
Chloride: 99 mmol/L (ref 98–111)
Creatinine, Ser: 0.68 mg/dL (ref 0.44–1.00)
GFR calc Af Amer: >60 mL/min (ref >60)
GFR calc non Af Amer: >60 mL/min (ref >60)
Glucose, Bld: 135 mg/dL — ABNORMAL HIGH (ref 70–99)
Potassium: 3.4 mmol/L — ABNORMAL LOW (ref 3.5–5.1)
Sodium: 135 mmol/L (ref 135–145)

## 2019-03-10 LAB — CBC
HCT: 33.7 % — ABNORMAL LOW (ref 36.0–46.0)
Hemoglobin: 10.9 g/dL — ABNORMAL LOW (ref 12.0–15.0)
MCH: 32.4 pg (ref 26.0–34.0)
MCHC: 32.3 g/dL (ref 30.0–36.0)
MCV: 100.3 fL — ABNORMAL HIGH (ref 80.0–100.0)
Platelets: 160 10*3/uL (ref 150–400)
RBC: 3.36 MIL/uL — ABNORMAL LOW (ref 3.87–5.11)
RDW: 12.6 % (ref 11.5–15.5)
WBC: 13.1 10*3/uL — ABNORMAL HIGH (ref 4.0–10.5)
nRBC: 0 % (ref 0.0–0.2)

## 2019-03-10 LAB — IRON AND TIBC
Iron: 42 ug/dL (ref 28–170)
Saturation Ratios: 16 % (ref 10.4–31.8)
TIBC: 269 ug/dL (ref 250–450)
UIBC: 227 ug/dL

## 2019-03-10 LAB — FERRITIN: Ferritin: 96 ng/mL (ref 11–307)

## 2019-03-10 LAB — FOLATE: Folate: 33.3 ng/mL (ref >5.9)

## 2019-03-10 LAB — VITAMIN B12: Vitamin B-12: 733 pg/mL (ref 180–914)

## 2019-03-10 MED ORDER — ASPIRIN 325 MG PO TBEC: 325.0000 mg | DELAYED_RELEASE_TABLET | Freq: Every day | ORAL | 0 refills | Status: DC

## 2019-03-10 MED ORDER — HYDROCODONE-ACETAMINOPHEN 5-325 MG PO TABS: 1.0000 | ORAL_TABLET | ORAL | 0 refills | Status: DC | PRN

## 2019-03-10 MED ORDER — POTASSIUM CHLORIDE CRYS ER 20 MEQ PO TBCR
40.0000 meq | EXTENDED_RELEASE_TABLET | Freq: Once | ORAL | Status: AC
  Administered 2019-03-10: 40 meq via ORAL
  Filled 2019-03-10: qty 2

## 2019-03-10 NOTE — Evaluation (Signed)
Occupational Therapy Evaluation Patient Details Name: Sarah GalVeral Wolf MRN: [id number] DOB: Oct 27, 1934 Today's Date: 03/10/2019    History of Present Illness pt admit 03/08/2019 after falling at home in the morning in her living room. s/p R distal femur fracture ORIF, NWB RLE   Clinical Impression   This 84 y/o female presents with the above. PTA pt reports she lives alone, is independent with ADL, iADL and functional mobility. Pt presenting with RLE pain, generalized weakness, decreased mobility status and functional performance. Pt presents seated in recliner this session, have required two person assist for safe completion of functional transfers; she requires setup-minA for seated UB ADL and max-total for LB ADL. She will benefit from continued acute OT services and recommend follow up therapy services in SNF setting after discharge to progress her towards her PLOF. Will follow.     Follow Up Recommendations  SNF;Supervision/Assistance - 24 hour    Equipment Recommendations  Other (comment)(TBD in next venue)           Precautions / Restrictions Precautions Precautions: Fall Restrictions Weight Bearing Restrictions: Yes RLE Weight Bearing: Non weight bearing      Mobility Bed Mobility               General bed mobility comments: received OOB in recliner                                                                  ADL either performed or assessed with clinical judgement   ADL Overall ADL's : Needs assistance/impaired Eating/Feeding: Modified independent;Sitting   Grooming: Set up;Wash/dry face;Sitting   Upper Body Bathing: Min guard;Sitting   Lower Body Bathing: Maximal assistance;Sitting/lateral leans   Upper Body Dressing : Set up;Min guard;Sitting   Lower Body Dressing: Maximal assistance;Total assistance;Sitting/lateral leans                 General ADL Comments: pt very motivated to return to [employer]     Vision          Perception     Praxis      Pertinent Vitals/Pain Pain Assessment: Faces Faces Pain Scale: Hurts a little bit Pain Location: RLE Pain Descriptors / Indicators: Discomfort;Sore Pain Intervention(s): Monitored during session;Repositioned     Hand Dominance Right   Extremity/Trunk Assessment Upper Extremity Assessment Upper Extremity Assessment: Generalized weakness   Lower Extremity Assessment Lower Extremity Assessment: Defer to PT evaluation       Communication Communication Communication: No difficulties   Cognition Arousal/Alertness: Awake/alert Behavior During Therapy: WFL for tasks assessed/performed Overall Cognitive Status: Within Functional Limits for tasks assessed                                     General Comments       Exercises Exercises: General Lower Extremity;General Upper Extremity General Exercises - Upper Extremity Shoulder Flexion: AROM;10 reps;Both;Seated General Exercises - Lower Extremity Ankle Circles/Pumps: AROM;Both;20 reps;Seated   Shoulder Instructions      Home Living Family/patient expects to be discharged to:: Private residence Living Arrangements: Alone Available Help at Discharge: Available PRN/intermittently Type of Home: House Home Access: Stairs to enter [employer]trance Stairs-Number of Steps: 1-2   Home Layout: One  level         Bathroom Toilet: Standard                Prior Functioning/Environment Level of Independence: Independent        Comments: very independent drives and active        OT Problem List: Decreased strength;Decreased range of motion;Decreased activity tolerance;Impaired balance (sitting and/or standing);Decreased safety awareness;Decreased knowledge of use of DME or AE;Pain;Decreased knowledge of precautions      OT Treatment/Interventions: Self-care/ADL training;Therapeutic exercise;DME and/or AE instruction;Therapeutic activities;Patient/family education;Balance training     OT Goals(Current goals can be found in the care plan section) Acute Rehab OT Goals Patient Stated Goal: I can't believe I did this!! I want to be independent and do for myself !!! OT Goal Formulation: With patient Time For Goal Achievement: 03/24/19 Potential to Achieve Goals: Good  OT Frequency: Min 2X/week   Barriers to D/C:            Co-evaluation              AM-PAC OT "6 Clicks" Daily Activity     Outcome Measure Help from another person eating meals?: None Help from another person taking care of personal grooming?: None Help from another person toileting, which includes using toliet, bedpan, or urinal?: Total Help from another person bathing (including washing, rinsing, drying)?: A Lot Help from another person to put on and taking off regular upper body clothing?: A Little Help from another person to put on and taking off regular lower body clothing?: Total 6 Click Score: 15   End of Session Nurse Communication: Mobility status  Activity Tolerance: Patient tolerated treatment well Patient left: in chair;with call bell/phone within reach;with chair alarm set  OT Visit Diagnosis: History of falling (Z91.81);Other abnormalities of gait and mobility (R26.89);Muscle weakness (generalized) (M62.81)                Time: 3335-4562 OT Time Calculation (min): 15 min Charges:  OT General Charges $OT Visit: 1 Visit OT Evaluation $OT Eval Moderate Complexity: 1 Mod  Lou Cal, OT [employer] Pager [phone] Office [phone]   Raymondo Band 03/10/2019, 2:02 PM

## 2019-03-10 NOTE — Discharge Instructions (Signed)
Keep dressing clean dry and intact right thigh. Able to shower and get dressing wet.   Right lower extremity non weight bearing. May touch down weight bearing right lower extremity for transfers. Able to bend right knee as tolerated.

## 2019-03-10 NOTE — Progress Notes (Signed)
Subjective: 2 Days Post-Op Procedure(s) (LRB): OPEN REDUCTION INTERNAL FIXATION (ORIF) DISTAL FEMUR FRACTURE (Right) Patient reports pain as moderate.    Objective: Vital signs in last 24 hours: Temp:  [98 F (36.7 C)-98.8 F (37.1 C)] 98.3 F (36.8 C) (08/23 0422) Pulse Rate:  [64-89] 64 (08/23 0422) Resp:  [14-18] 18 (08/23 0422) BP: (121-177)/(58-89) 122/58 (08/23 0422) SpO2:  [90 %-98 %] 96 % (08/23 0422)  Intake/Output from previous day: 08/22 0701 - 08/23 0700 In: 1412.7 [P.O.:260; I.V.:1152.7] Out: 1750 [Urine:1750] Intake/Output this shift: No intake/output data recorded.  Recent Labs    03/08/19 1044  HGB 13.6   Recent Labs    03/08/19 1044  WBC 15.7*  RBC 4.25  HCT 41.5  PLT 233   Recent Labs    03/08/19 1044 03/08/19 1318  NA 136 138  K 5.8* 3.9  CL 100 101  CO2 27 28  BUN 17 18  CREATININE 0.87 0.69  GLUCOSE 129* 124*  CALCIUM 9.1 9.4   Recent Labs    03/08/19 1044  INR 1.0    Right lower extremity: Neurologically intact Dorsiflexion/Plantar flexion intact Incision: dressing C/D/I Compartment soft   Assessment/Plan: 2 Days Post-Op Procedure(s) (LRB): OPEN REDUCTION INTERNAL FIXATION (ORIF) DISTAL FEMUR FRACTURE (Right) Explained to patient she can get up for transfers to chair. Non weight bearing right lower extremity. She is able to bend her knee as tolerated.         GILBERT CLARK 03/10/2019, 9:03 AM

## 2019-03-10 NOTE — Progress Notes (Addendum)
PROGRESS NOTE    Miriana Archibeque  SAY:[id number] DOB: Jun 26, 1935 DOA: 03/08/2019 PCP: Patient, No Pcp Per    Brief Narrative:  84 year old lady with no significant past medical history presents to ED with a mechanical fall at home on 8/21 and was found to have distal femur fracture. Orthopedics consulted and she underwent ORIF by Dr Ninfa Linden on 8/21. Physical therapy recommended SNF placement for continued therapy.  Social work consult and awaiting placement.    Assessment & Plan:   Active Problems:   Hip fracture (HCC)   Closed comminuted intra-articular fracture of distal femur, right, initial encounter ([place])   Closed displaced supracondylar fracture with intracondylar extension of lower end of left femur ([place])  Distal comminuted femur fracture on the right: S/p ORIF by Dr Ninfa Linden on 8/21.  Pain control with norco and tramadol.  Physical therapy recommending SNF and social work consulted.    Leukocytosis;  ? Reactive , rule out infection.  Pt denies any dysuria, and CXR does not show any cardiopulm abnormalities.  Get UA and urine culture. UA on admission abnormal with leukocytes and few bacteria. Currently she is afebrile  And does not appear toxic.    Anemia of blood loss from the surgery with a component of hemodilution.  Hemoglobin at 10/9 today , dropped from 13.6 on admission. We do not have baseline hemoglobin levels.  Get anemia panel.  Monitor hemoglobin and hematocrit.    Hypokalemia:  Replaced.   Hypertension, sinus tachycardia with sinus arrhythmia on admission:  - she was started on coreg with improvement in bp and HR.  - echocardiogram this admission shows The left ventricle has normal systolic function with an ejection fraction of 60-65%. The cavity size was normal. There is mild asymmetric left ventricular hypertrophy. Left ventricular diastolic function could not be evaluated secondary to atrial fibrillation. Will get a 12 lead EKG today.  She might need  holter monitor placement and possible outpatient follow up with cardiology for evaluation of sinus arrhythmias.     DVT prophylaxis: scd's, aspirin.  Code Status: full code.  Family Communication: none at beside.  Disposition Plan: pending SNF   Consultants:   Orthopedics Dr Ninfa Linden   Procedures: ORIF on the right. On 8/21   Antimicrobials: none   Subjective: Reports her right leg is painful, but refused pain medications.  No chest pain or sob. No nausea or vomiting.   Objective: Vitals:   03/09/19 2013 03/09/19 2358 03/10/19 0422 03/10/19 1148  BP: (!) 141/68 121/89 (!) 122/58   Pulse: 86 87 64   Resp: 16 16 18    Temp: 98.8 F (37.1 C) 98 F (36.7 C) 98.3 F (36.8 C)   TempSrc: Oral Oral Oral   SpO2: 92% 90% 96% 94%  Weight:      Height:        Intake/Output Summary (Last 24 hours) at 03/10/2019 1212 Last data filed at 03/10/2019 0600 Gross per 24 hour  Intake 1392.73 ml  Output 1250 ml  Net 142.73 ml   Filed Weights   03/08/19 1504  Weight: 63.5 kg    Examination:  General exam: Appears calm and comfortable , not in any distress.  Respiratory system: Clear to auscultation. Respiratory effort normal. Cardiovascular system: S1 & S2 heard, no JVD.  Gastrointestinal system: Abdomen is soft , non tender non distended bowel sounds good.  Central nervous system: Alert and oriented to place and person only, slightly confused ths am.  Extremities: right leg painful movements. No  pedal edema or cyanosis  Skin: No rashes, lesions or ulcers Psychiatry: Mood & affect appropriate.     Data Reviewed: I have personally reviewed following labs and imaging studies  CBC: Recent Labs  Lab 03/08/19 1044 03/10/19 0935  WBC 15.7* 13.1*  NEUTROABS 13.9*  --   HGB 13.6 10.9*  HCT 41.5 33.7*  MCV 97.6 100.3*  PLT 233 160   Basic Metabolic Panel: Recent Labs  Lab 03/08/19 1044 03/08/19 1318 03/10/19 0935  NA 136 138 135  K 5.8* 3.9 3.4*  CL 100 101 99   CO2 27 28 27   GLUCOSE 129* 124* 135*  BUN 17 18 17   CREATININE 0.87 0.69 0.68  CALCIUM 9.1 9.4 8.7*   GFR: Estimated Creatinine Clearance: 50.8 mL/min (by C-G formula based on SCr of 0.68 mg/dL). Liver Function Tests: No results for input(s): AST, ALT, ALKPHOS, BILITOT, PROT, ALBUMIN in the last 168 hours. No results for input(s): LIPASE, AMYLASE in the last 168 hours. No results for input(s): AMMONIA in the last 168 hours. Coagulation Profile: Recent Labs  Lab 03/08/19 1044  INR 1.0   Cardiac Enzymes: No results for input(s): CKTOTAL, CKMB, CKMBINDEX, TROPONINI in the last 168 hours. BNP (last 3 results) No results for input(s): PROBNP in the last 8760 hours. HbA1C: No results for input(s): HGBA1C in the last 72 hours. CBG: No results for input(s): GLUCAP in the last 168 hours. Lipid Profile: No results for input(s): CHOL, HDL, LDLCALC, TRIG, CHOLHDL, LDLDIRECT in the last 72 hours. Thyroid Function Tests: No results for input(s): TSH, T4TOTAL, FREET4, T3FREE, THYROIDAB in the last 72 hours. Anemia Panel: No results for input(s): VITAMINB12, FOLATE, FERRITIN, TIBC, IRON, RETICCTPCT in the last 72 hours. Sepsis Labs: No results for input(s): PROCALCITON, LATICACIDVEN in the last 168 hours.  Recent Results (from the past 240 hour(s))  SARS Coronavirus 2 [facility](Hospital order, Performed in [facility] hospital lab) Nasopharyngeal Nasopharyngeal Swab     Status: None   Collection Time: 03/08/19 10:44 AM   Specimen: Nasopharyngeal Swab  Result Value Ref Range Status   SARS Coronavirus 2 NEGATIVE NEGATIVE Final    Comment: (NOTE) If result is NEGATIVE SARS-CoV-2 target nucleic acids are NOT DETECTED. The SARS-CoV-2 RNA is generally detectable in upper and lower  respiratory specimens during the acute phase of infection. The lowest  concentration of SARS-CoV-2 viral copies this assay can detect is 250  copies / mL. A negative result does not preclude SARS-CoV-2 infection  and  should not be used as the sole basis for treatment or other  patient management decisions.  A negative result may occur with  improper specimen collection / handling, submission of specimen other  than nasopharyngeal swab, presence of viral mutation(s) within the  areas targeted by this assay, and inadequate number of viral copies  (<250 copies / mL). A negative result must be combined with clinical  observations, patient history, and epidemiological information. If result is POSITIVE SARS-CoV-2 target nucleic acids are DETECTED. The SARS-CoV-2 RNA is generally detectable in upper and lower  respiratory specimens dur ing the acute phase of infection.  Positive  results are indicative of active infection with SARS-CoV-2.  Clinical  correlation with patient history and other diagnostic information is  necessary to determine patient infection status.  Positive results do  not rule out bacterial infection or co-infection with other viruses. If result is PRESUMPTIVE POSTIVE SARS-CoV-2 nucleic acids MAY BE PRESENT.   A presumptive positive result was obtained on the submitted specimen  and confirmed on repeat testing.  While 2019 novel coronavirus  (SARS-CoV-2) nucleic acids may be present in the submitted sample  additional confirmatory testing may be necessary for epidemiological  and / or clinical management purposes  to differentiate between  SARS-CoV-2 and other Sarbecovirus currently known to infect humans.  If clinically indicated additional testing with an alternate test  methodology [id number]) is advised. The SARS-CoV-2 RNA is generally  detectable in upper and lower respiratory sp ecimens during the acute  phase of infection. The expected result is Negative. Fact Sheet for Patients:  BoilerBrush.com.cyhttps://www.fda.gov/media/136312/download Fact Sheet for Healthcare Providers: https://pope.com/https://www.fda.gov/media/136313/download This test is not yet approved or cleared by the [place] FDA and has been  authorized for detection and/or diagnosis of SARS-CoV-2 by FDA under an Emergency Use Authorization (EUA).  This EUA will remain in effect (meaning this test can be used) for the duration of the COVID-19 declaration under Section 564(b)(1) of the Act, 21 U.S.C. section 360bbb-3(b)(1), unless the authorization is terminated or revoked sooner. Performed at [facility], [address] [place], [place] [address]   Surgical pcr screen     Status: None   Collection Time: 03/08/19  2:55 PM   Specimen: Nasal Mucosa; Nasal Swab  Result Value Ref Range Status   MRSA, PCR NEGATIVE NEGATIVE Final   Staphylococcus aureus NEGATIVE NEGATIVE Final    Comment: (NOTE) The Xpert SA Assay (FDA approved for NASAL specimens in patients 82 years of age and older), is one component of a comprehensive surveillance program. It is not intended to diagnose infection nor to guide or monitor treatment. Performed at [facility], [address] [place], [place] [address]          Radiology Studies: Dg C-arm 1-60 Min-no Report  Result Date: 03/08/2019 Fluoroscopy was utilized by the requesting physician.  No radiographic interpretation.   Dg Femur, Min 2 Views Right  Result Date: 03/08/2019 CLINICAL DATA:  ORIF of right femoral fracture EXAM: RIGHT FEMUR 2 VIEWS COMPARISON:  Film from earlier in the same day FLUOROSCOPY TIME:  Radiation Exposure Index (as provided by the fluoroscopic device): 3.25 mGy If the device does not provide the exposure index: Fluoroscopy Time:  42 seconds Number of Acquired Images:  5 FINDINGS: Lateral fixation sideplate is noted with multiple fixation screws. The fracture fragments have been reduced. No other focal abnormality is noted. IMPRESSION: Status post ORIF of distal femoral fracture Electronically Signed   By: Alcide CleverMark  Lukens M.D.   On: 03/08/2019 17:35        Scheduled Meds: . aspirin EC  325 mg Oral Q breakfast  . carvedilol   3.125 mg Oral BID  . docusate sodium  100 mg Oral BID  . pantoprazole  40 mg Oral Daily  . potassium chloride  40 mEq Oral Once  . senna-docusate  2 tablet Oral QHS   Continuous Infusions: . lactated ringers 50 mL/hr at 03/10/19 0600  . methocarbamol (ROBAXIN) IV 500 mg (03/08/19 1802)     LOS: 2 days    Time spent: 36 minutes.     Kathlen ModyVijaya Dechelle Attaway, MD [facility] Pager [phone]  If 7PM-7AM, please contact night-coverage [email or web address] Password [email or web address] 03/10/2019, 12:12 PM

## 2019-03-11 ENCOUNTER — Encounter (HOSPITAL_COMMUNITY): Payer: Self-pay | Admitting: Orthopaedic Surgery

## 2019-03-11 DIAGNOSIS — S72462S Displaced supracondylar fracture with intracondylar extension of lower end of left femur, sequela: Secondary | ICD-10-CM

## 2019-03-11 LAB — CBC
HCT: 31.3 % — ABNORMAL LOW (ref 36.0–46.0)
Hemoglobin: 10.2 g/dL — ABNORMAL LOW (ref 12.0–15.0)
MCH: 32.6 pg (ref 26.0–34.0)
MCHC: 32.6 g/dL (ref 30.0–36.0)
MCV: 100 fL (ref 80.0–100.0)
Platelets: 162 10*3/uL (ref 150–400)
RBC: 3.13 MIL/uL — ABNORMAL LOW (ref 3.87–5.11)
RDW: 12.9 % (ref 11.5–15.5)
WBC: 9.9 10*3/uL (ref 4.0–10.5)
nRBC: 0 % (ref 0.0–0.2)

## 2019-03-11 LAB — BASIC METABOLIC PANEL
Anion gap: 9 (ref 5–15)
BUN: 17 mg/dL (ref 8–23)
CO2: 27 mmol/L (ref 22–32)
Calcium: 8.6 mg/dL — ABNORMAL LOW (ref 8.9–10.3)
Chloride: 104 mmol/L (ref 98–111)
Creatinine, Ser: 0.56 mg/dL (ref 0.44–1.00)
GFR calc Af Amer: >60 mL/min (ref >60)
GFR calc non Af Amer: >60 mL/min (ref >60)
Glucose, Bld: 105 mg/dL — ABNORMAL HIGH (ref 70–99)
Potassium: 4.5 mmol/L (ref 3.5–5.1)
Sodium: 140 mmol/L (ref 135–145)

## 2019-03-11 LAB — URINE CULTURE: Culture: NO GROWTH

## 2019-03-11 MED ORDER — SODIUM CHLORIDE 0.9 % IV SOLN
1.0000 g | INTRAVENOUS | Status: DC
  Administered 2019-03-11: 1 g via INTRAVENOUS
  Filled 2019-03-11: qty 10
  Filled 2019-03-11: qty 1

## 2019-03-11 NOTE — Progress Notes (Signed)
Physical Therapy Treatment Patient Details Name: Sarah Wolf MRN: [id number] DOB: 25-May-1935 Today's Date: 03/11/2019    History of Present Illness pt admit 03/08/2019 after falling at home in the morning in her living room. s/p R distal femur fracture ORIF, NWB RLE    PT Comments    Pt eager to mobilize due to RLE discomfort in bed. Pt continues to present with generalized weakness, severe RLE pain especially with RLE movement, and difficulty performing mobility tasks, but pt requires less physical assist this session. Pt agreeable to low level RLE exercises to promote LE strengthening, limited by pain. PT continuing to recommend SNF, will continue to follow acutely.    Follow Up Recommendations  SNF     Equipment Recommendations  Other (comment)(defer to next venue)    Recommendations for Other Services       Precautions / Restrictions Precautions Precautions: Fall Restrictions Weight Bearing Restrictions: Yes RLE Weight Bearing: Non weight bearing Other Position/Activity Restrictions: MD agreeable to RLE ROM to pt tolerance    Mobility  Bed Mobility Overal bed mobility: Needs Assistance Bed Mobility: Supine to Sit     Supine to sit: +2 for safety/equipment;HOB elevated;Min assist     General bed mobility comments: Min assist +2 for trunk elevation, RLE lifting and translation to EOB, scooting to EOB with use of pt UEs on bedrails and bed pad.  Transfers Overall transfer level: Needs assistance Equipment used: Rolling walker (2 wheeled) Transfers: Sit to/from [facility] Sit to Stand: Min assist;From elevated surface;+2 safety/equipment Stand pivot transfers: Min assist;+2 safety/equipment;From elevated surface       General transfer comment: Min assist +2 for safety for power up, steadying, reinforcing NWB RLE with verbal and tactile cues. Min assist for stand pivot to recliner for steadying and reinforcing RLE NWB, pt able to take pivot hop-to  steps with occasional RLE TTWB. PT discouraged any WB in standing. PT gave min assist for slow eccentric lowering into recliner with positioning assist with use of bed pads and +2.  Ambulation/Gait Ambulation/Gait assistance: (NT)               Stairs             Wheelchair Mobility    Modified Rankin (Stroke Patients Only)       Balance Overall balance assessment: Needs assistance;History of Falls Sitting-balance support: No upper extremity supported;Feet supported Sitting balance-Leahy Scale: Fair Sitting balance - Comments: able to sit unsupported   Standing balance support: Bilateral upper extremity supported Standing balance-Leahy Scale: Poor                              Cognition Arousal/Alertness: Awake/alert Behavior During Therapy: WFL for tasks assessed/performed Overall Cognitive Status: Within Functional Limits for tasks assessed                                 General Comments: pt is a delight with PT      Exercises General Exercises - Lower Extremity Ankle Circles/Pumps: AROM;Both;20 reps;Seated Quad Sets: AROM;Right;15 reps;Seated Heel Slides: AAROM;Right;5 reps;Supine    General Comments        Pertinent Vitals/Pain Pain Assessment: 0-10 Pain Score: 8  Pain Location: RLE Pain Descriptors / Indicators: Sore;Discomfort;Crying Pain Intervention(s): Limited activity within patient's tolerance;Monitored during session;Repositioned    Home Living  Prior Function            PT Goals (current goals can now be found in the care plan section) Acute Rehab PT Goals PT Goal Formulation: With patient Time For Goal Achievement: 03/22/19 Potential to Achieve Goals: Good Progress towards PT goals: Progressing toward goals    Frequency    Min 3X/week      PT Plan Equipment recommendations need to be updated    Co-evaluation              AM-PAC PT "6 Clicks" Mobility    Outcome Measure  Help needed turning from your back to your side while in a flat bed without using bedrails?: A Little Help needed moving from lying on your back to sitting on the side of a flat bed without using bedrails?: A Little Help needed moving to and from a bed to a chair (including a wheelchair)?: A Little Help needed standing up from a chair using your arms (e.g., wheelchair or bedside chair)?: A Little Help needed to walk in hospital room?: Total Help needed climbing 3-5 steps with a railing? : Total 6 Click Score: 14    End of Session Equipment Utilized During Treatment: Gait belt Activity Tolerance: Patient tolerated treatment well Patient left: in chair;with call bell/phone within reach;with chair alarm set;with SCD's reapplied Nurse Communication: Mobility status PT Visit Diagnosis: Other abnormalities of gait and mobility (R26.89)     Time: 1601-09321227-1244 PT Time Calculation (min) (ACUTE ONLY): 17 min  Charges:  $Therapeutic Activity: 8-22 mins                     Reyana Leisey Terrial Rhodes Sabastion Hrdlicka, PT Acute Rehabilitation Services Pager [phone]  Office [phone]   Marlies Ligman D Donnalyn Juran 03/11/2019, 1:14 PM

## 2019-03-11 NOTE — Progress Notes (Signed)
Subjective: 3 Days Post-Op Procedure(s) (LRB): OPEN REDUCTION INTERNAL FIXATION (ORIF) DISTAL FEMUR FRACTURE (Right) Patient reports pain as moderate.    Objective: Vital signs in last 24 hours: Temp:  [98.4 F (36.9 C)] 98.4 F (36.9 C) (08/24 0515) Pulse Rate:  [62-92] 62 (08/24 0515) Resp:  [15-17] 15 (08/24 0515) BP: (136-181)/(75-90) 136/75 (08/24 0515) SpO2:  [90 %-95 %] 90 % (08/24 0515)  Intake/Output from previous day: 08/23 0701 - 08/24 0700 In: 1661.8 [P.O.:480; I.V.:1181.8] Out: 1800 [Urine:1800] Intake/Output this shift: No intake/output data recorded.  Recent Labs    03/08/19 1044 03/10/19 0935 03/11/19 0535  HGB 13.6 10.9* 10.2*   Recent Labs    03/10/19 0935 03/10/19 1258 03/11/19 0535  WBC 13.1*  --  9.9  RBC 3.36* 3.54* 3.13*  HCT 33.7*  --  31.3*  PLT 160  --  162   Recent Labs    03/10/19 0935 03/11/19 0535  NA 135 140  K 3.4* 4.5  CL 99 104  CO2 27 27  BUN 17 17  CREATININE 0.68 0.56  GLUCOSE 135* 105*  CALCIUM 8.7* 8.6*   Recent Labs    03/08/19 1044  INR 1.0    Sensation intact distally Intact pulses distally Dorsiflexion/Plantar flexion intact Incision: scant drainage Compartment soft   Assessment/Plan: 3 Days Post-Op Procedure(s) (LRB): OPEN REDUCTION INTERNAL FIXATION (ORIF) DISTAL FEMUR FRACTURE (Right) Up with therapy NWB right LE; can move knee     Mcarthur Rossetti 03/11/2019, 9:41 AM

## 2019-03-11 NOTE — [place] FL2 (Signed)
[place] MEDICAID FL2 LEVEL OF CARE SCREENING TOOL     IDENTIFICATION  Patient Name: Sarah Wolf Birthdate: 09/02/1934 Sex: female Admission Date (Current Location): 03/08/2019  [facility] and [place] Number:  Herbalist and Address:  [facility],  [place], [place]      Provider Number: [id number]  Attending Physician Name and Address:  Marcell Anger*  Relative Name and Phone Number:  Robley Fries [phone]    Current Level of Care: Hospital Recommended Level of Care: [place] Prior Approval Number:    Date Approved/Denied:   PASRR Number: [id number]  Discharge Plan: SNF    Current Diagnoses: Patient Active Problem List   Diagnosis Date Noted  . Hip fracture ([place]) 03/08/2019  . Closed comminuted intra-articular fracture of distal femur, right, initial encounter ([place])   . Closed displaced supracondylar fracture with intracondylar extension of lower end of left femur (HCC)     Orientation RESPIRATION BLADDER Height & Weight     Self, Time, Situation, Place  Normal Continent Weight: 63.5 kg Height:  5\' 6"  (167.6 cm)  BEHAVIORAL SYMPTOMS/MOOD NEUROLOGICAL BOWEL NUTRITION STATUS      Continent Diet(Heart healthy diet)  AMBULATORY STATUS COMMUNICATION OF NEEDS Skin   Limited Assist Verbally Normal(R hip fracture-ORIF)                       Personal Care Assistance Level of Assistance  Bathing, Feeding, Dressing Bathing Assistance: Limited assistance Feeding assistance: Limited assistance Dressing Assistance: Limited assistance     Functional Limitations Info  Speech, Hearing, Sight(upper dentures full;2 lower teeth) Sight Info: Adequate Hearing Info: Adequate Speech Info: Impaired    SPECIAL CARE FACTORS FREQUENCY  PT (By licensed PT), OT (By licensed OT)(NWB RLE)     PT Frequency: 5x week OT Frequency: 5x week            Contractures Contractures Info: Not present     Additional Factors Info  Code Status Code Status Info: full code             Current Medications (03/11/2019):  This is the current hospital active medication list Current Facility-Administered Medications  Medication Dose Route Frequency Provider Last Rate Last Dose  . acetaminophen (TYLENOL) tablet 325-650 mg  325-650 mg Oral Q6H PRN Mcarthur Rossetti, MD      . aspirin EC tablet 325 mg  325 mg Oral Q breakfast Mcarthur Rossetti, MD   325 mg at 03/11/19 1112  . carvedilol (COREG) tablet 3.125 mg  3.125 mg Oral BID Terrilee Croak, MD   3.125 mg at 03/11/19 1113  . docusate sodium (COLACE) capsule 100 mg  100 mg Oral BID Mcarthur Rossetti, MD   100 mg at 03/11/19 1112  . hydrALAZINE (APRESOLINE) injection 5 mg  5 mg Intravenous Q6H PRN Mcarthur Rossetti, MD      . HYDROcodone-acetaminophen [facility]) 7.5-325 MG per tablet 1-2 tablet  1-2 tablet Oral Q4H PRN Mcarthur Rossetti, MD   1 tablet at 03/10/19 [phone]  . HYDROcodone-acetaminophen (NORCO/VICODIN) 5-325 MG per tablet 1-2 tablet  1-2 tablet Oral Q4H PRN Mcarthur Rossetti, MD   1 tablet at 03/10/19 2117  . lactated ringers infusion   Intravenous Continuous Mcarthur Rossetti, MD 50 mL/hr at 03/11/19 1111    . menthol-cetylpyridinium (CEPACOL) lozenge 3 mg  1 lozenge Oral PRN Mcarthur Rossetti, MD       Or  .  phenol (CHLORASEPTIC) mouth spray 1 spray  1 spray Mouth/Throat PRN Kathryne HitchBlackman, Christopher Y, MD      . methocarbamol (ROBAXIN) tablet 500 mg  500 mg Oral Q6H PRN Kathryne HitchBlackman, Christopher Y, MD   500 mg at 03/09/19 0350   Or  . methocarbamol (ROBAXIN) 500 mg in dextrose 5 % 50 mL IVPB  500 mg Intravenous Q6H PRN Kathryne HitchBlackman, Christopher Y, MD 100 mL/hr at 03/08/19 1802 500 mg at 03/08/19 1802  . metoCLOPramide (REGLAN) tablet 5-10 mg  5-10 mg Oral Q8H PRN Kathryne HitchBlackman, Christopher Y, MD       Or  . metoCLOPramide (REGLAN) injection 5-10 mg  5-10 mg Intravenous Q8H PRN Kathryne HitchBlackman, Christopher Y, MD       . morphine 2 MG/ML injection 0.5-1 mg  0.5-1 mg Intravenous Q2H PRN Kathryne HitchBlackman, Christopher Y, MD   1 mg at 03/11/19 0930  . ondansetron (ZOFRAN) injection 4 mg  4 mg Intravenous Q6H PRN Kathryne HitchBlackman, Christopher Y, MD      . ondansetron [facility](ZOFRAN) tablet 4 mg  4 mg Oral Q6H PRN Kathryne HitchBlackman, Christopher Y, MD       Or  . ondansetron [facility](ZOFRAN) injection 4 mg  4 mg Intravenous Q6H PRN Kathryne HitchBlackman, Christopher Y, MD      . pantoprazole (PROTONIX) EC tablet 40 mg  40 mg Oral Daily Kathryne HitchBlackman, Christopher Y, MD   40 mg at 03/11/19 1112  . senna-docusate (Senokot-S) tablet 2 tablet  2 tablet Oral QHS Kathryne HitchBlackman, Christopher Y, MD   2 tablet at 03/09/19 2125  . traMADol (ULTRAM) tablet 50 mg  50 mg Oral Q12H PRN Kathryne HitchBlackman, Christopher Y, MD         Discharge Medications: Please see discharge summary for a list of discharge medications.  Relevant Imaging Results:  Relevant Lab Results:   Additional Information [phone]  Ardice Boyan, Olegario MessierKathy, [place]

## 2019-03-11 NOTE — TOC Initial Note (Signed)
Transition of Care [facility]) - Initial/Assessment Note    Patient Details  Name: Sarah Wolf MRN: [id number] Date of Birth: Feb 20, 1935  [facility] CM/SW Contact:    Dessa Phi, RN Phone Number: 03/11/2019, 12:55 PM  Clinical Narrative:  Spoke to patient about d/c plans-PT recc SNF-Patient in agreement to faxing out for SNF. MD to order COVID, Will contact insurance to start [place].                   Barriers to Discharge: Continued Medical Work up, Ship broker   Patient Goals and CMS Choice Patient states their goals for this hospitalization and ongoing recovery are:: get stronger CMS Medicare.gov Compare Post Acute Care list provided to:: Patient Represenative (must comment)(nephew Bud Mancel Bale)    Expected Discharge Plan and Services                                                Prior Living Arrangements/Services                       Activities of Daily Living Home Assistive Devices/Equipment: Dentures (specify type), Eyeglasses ADL Screening (condition at time of admission) Patient's cognitive ability adequate to safely complete daily activities?: Yes Is the patient deaf or have difficulty hearing?: No Does the patient have difficulty seeing, even when wearing glasses/contacts?: No Does the patient have difficulty concentrating, remembering, or making decisions?: No Patient able to express need for assistance with ADLs?: Yes Does the patient have difficulty dressing or bathing?: No Independently performs ADLs?: Yes (appropriate for developmental age) Does the patient have difficulty walking or climbing stairs?: No Weakness of Legs: None Weakness of Arms/Hands: None  Permission Sought/Granted                  Emotional Assessment              Admission diagnosis:  Right knee pain [M25.561] Patient Active Problem List   Diagnosis Date Noted  . Hip fracture ([place]) 03/08/2019  . Closed comminuted intra-articular  fracture of distal femur, right, initial encounter ([place])   . Closed displaced supracondylar fracture with intracondylar extension of lower end of left femur [facility])    PCP:  Patient, No Pcp Per Pharmacy:   [facility] ([address] [place] [address] [place] [address] Phone: [phone] Fax: [phone]     Social Determinants of Health (SDOH) Interventions    Readmission Risk Interventions No flowsheet data found.

## 2019-03-11 NOTE — Care Management Important Message (Signed)
Important Message  Patient Details  Name: Sarah Wolf MRN: [id number] Date of Birth: Aug 08, 1934   Medicare Important Message Given:  Yes. CMA printed out IM for Case Management Nurse or CSW to give to patient.      [name] 03/11/2019, 8:37 AM

## 2019-03-11 NOTE — Progress Notes (Signed)
PROGRESS NOTE    Sarah Wolf  ZOX:[id number] DOB: [birth year] DOA: 03/08/2019 PCP: Patient, No Pcp Per   Brief Narrative:  84 year old lady with no significant past medical history presents to ED with a mechanical fall at home on 8/21 and was found to have distal femur fracture. Orthopedics consulted and she underwent ORIF by Dr Magnus IvanBlackman on 8/21. Physical therapy recommended SNF placement for continued therapy.  Social work consult and awaiting placement.    Assessment & Plan:   Active Problems:   Hip fracture (HCC)   Closed comminuted intra-articular fracture of distal femur, right, initial encounter (HCC)   Closed displaced supracondylar fracture with intracondylar extension of lower end of left femur (HCC)   Closed displaced supracondylar fracture with intracondylar extension of lower end of left femur (HCC)  Distal comminuted femur fracture on the right: S/p ORIF by Dr Magnus IvanBlackman on 8/21.  Pain control with norco and tramadol.  Physical therapy recommending SNF and social work consulted.-    Leukocytosis with UTI;  U/a pos, added abx, f/up cx.    Anemia of blood loss from the surgery with a component of hemodilution.  Hemoglobin at 10/9 today , dropped from 13.6 on admission. We do not have baseline hemoglobin levels.  F/up anemia panel.  Monitor hemoglobin and hematocrit.    Hypokalemia:  Replaced.   Hypertension, sinus tachycardia with sinus arrhythmia on admission:  - she was started on coreg with improvement in bp and HR.  - echocardiogram this admission shows The left ventricle has normal systolic function with an ejection fraction of 60-65%. The cavity size was normal. There is mild asymmetric left ventricular hypertrophy. Left ventricular diastolic function could not be evaluated secondary to atrial fibrillation. She might need holter monitor placement and possible outpatient follow up with cardiology for evaluation of sinus arrhythmias.   DVT prophylaxis:  SCD/Compression stockings  Code Status: full    Code Status Orders  (From admission, onward)         Start     Ordered   03/08/19 1215  Full code  Continuous     03/08/19 1214        Code Status History    This patient has a current code status but no historical code status.   Advance Care Planning Activity    Advance Directive Documentation     Most Recent Value  Type of Advance Directive  Healthcare Power of Attorney, Living will  Pre-existing out of facility DNR order (yellow form or pink MOST form)  --  "MOST" Form in Place?  --     Family Communication: none today  Disposition Plan:   Patient will remain inpatient, awaiting transfer to skilled nursing facility for intensive postoperative physical therapy for gait mobility balance and transfer.  Without this treatment patient be at risk of life-threatening clinical decline and injury. Consults called: None Admission status: Inpatient   Consultants:   ortho  Procedures:  Dg Chest Port 1 View  Result Date: 03/08/2019 CLINICAL DATA:  Preoperative evaluation for femur surgery EXAM: PORTABLE CHEST 1 VIEW COMPARISON:  None. FINDINGS: Elevation of left hemidiaphragm is noted. No focal infiltrate or sizable effusion is seen. Cardiac shadow is at the upper limits of normal in size. Aortic calcifications are noted. IMPRESSION: No acute abnormality noted. Electronically Signed   By: Alcide CleverMark  Lukens M.D.   On: 03/08/2019 11:07   Dg Knee Complete 4 Views Right  Result Date: 03/08/2019 CLINICAL DATA:  Pain following fall EXAM: RIGHT KNEE -  COMPLETE 4+ VIEW COMPARISON:  None. FINDINGS: Frontal, lateral, and bilateral oblique views were obtained. There is a comminuted fracture of the femur at the diaphysis-metaphysis junction. A fracture fragment extends parallel to the cortex to extend to the joint space just lateral to the midline of the distal femur. There is posterior and lateral displacement of the distal major fracture fragment with  respect to the proximal major fracture fragment. No gross dislocation. No appreciable joint effusion. No appreciable joint space narrowing or erosion. IMPRESSION: Comminuted fracture distal femur with a fracture fragment extending to the level of the knee joint along the distal femoral condyle just lateral to the midline. There is posterior and lateral displacement of the major distal fracture fragment with respect to the major proximal fragment. No gross dislocation. Other visualized bony structures appear intact. Electronically Signed   By: Bretta BangWilliam  Woodruff III M.D.   On: 03/08/2019 10:26   Dg C-arm 1-60 Min-no Report  Result Date: 03/08/2019 Fluoroscopy was utilized by the requesting physician.  No radiographic interpretation.   Dg Femur, Min 2 Views Right  Result Date: 03/08/2019 CLINICAL DATA:  ORIF of right femoral fracture EXAM: RIGHT FEMUR 2 VIEWS COMPARISON:  Film from earlier in the same day FLUOROSCOPY TIME:  Radiation Exposure Index (as provided by the fluoroscopic device): 3.25 mGy If the device does not provide the exposure index: Fluoroscopy Time:  42 seconds Number of Acquired Images:  5 FINDINGS: Lateral fixation sideplate is noted with multiple fixation screws. The fracture fragments have been reduced. No other focal abnormality is noted. IMPRESSION: Status post ORIF of distal femoral fracture Electronically Signed   By: Alcide CleverMark  Lukens M.D.   On: 03/08/2019 17:35     Antimicrobials:   Ceftriaxone day 1   Subjective: No acute events overnight Patient comfortably in bed Needs Covid testing for transition to skilled nursing facility  Objective: Vitals:   03/10/19 1148 03/10/19 2115 03/10/19 2324 03/11/19 0515  BP:  (!) 181/90 (!) 156/75 136/75  Pulse:  92 88 62  Resp:  17 16 15   Temp:   98.4 F (36.9 C) 98.4 F (36.9 C)  TempSrc:  Oral Oral Oral  SpO2: 94% 95% 93% 90%  Weight:      Height:        Intake/Output Summary (Last 24 hours) at 03/11/2019 1337 Last data  filed at 03/11/2019 0900 Gross per 24 hour  Intake 1901.76 ml  Output 400 ml  Net 1501.76 ml   Filed Weights   03/08/19 1504  Weight: 63.5 kg    Examination:  General exam: Appears calm and comfortable  Respiratory system: Clear to auscultation. Respiratory effort normal. Cardiovascular system: S1 & S2 heard, RRR. No JVD, murmurs, rubs, gallops or clicks. No pedal edema. Gastrointestinal system: Abdomen is nondistended, soft and nontender. No organomegaly or masses felt. Normal bowel sounds heard. Central nervous system: Alert and oriented. No focal neurological deficits. Extremities: Warm well perfused, limited range of motion on affected surgical extremity Skin: No rashes, lesions or ulcers Psychiatry: Judgement and insight appear normal. Mood & affect appropriate.     Data Reviewed: I have personally reviewed following labs and imaging studies  CBC: Recent Labs  Lab 03/08/19 1044 03/10/19 0935 03/11/19 0535  WBC 15.7* 13.1* 9.9  NEUTROABS 13.9*  --   --   HGB 13.6 10.9* 10.2*  HCT 41.5 33.7* 31.3*  MCV 97.6 100.3* 100.0  PLT 233 160 162   Basic Metabolic Panel: Recent Labs  Lab 03/08/19 1044 03/08/19 1318  03/10/19 0935 03/11/19 0535  NA 136 138 135 140  K 5.8* 3.9 3.4* 4.5  CL 100 101 99 104  CO2 27 28 27 27   GLUCOSE 129* 124* 135* 105*  BUN 17 18 17 17   CREATININE 0.87 0.69 0.68 0.56  CALCIUM 9.1 9.4 8.7* 8.6*   GFR: Estimated Creatinine Clearance: 50.8 mL/min (by C-G formula based on SCr of 0.56 mg/dL). Liver Function Tests: No results for input(s): AST, ALT, ALKPHOS, BILITOT, PROT, ALBUMIN in the last 168 hours. No results for input(s): LIPASE, AMYLASE in the last 168 hours. No results for input(s): AMMONIA in the last 168 hours. Coagulation Profile: Recent Labs  Lab 03/08/19 1044  INR 1.0   Cardiac Enzymes: No results for input(s): CKTOTAL, CKMB, CKMBINDEX, TROPONINI in the last 168 hours. BNP (last 3 results) No results for input(s): PROBNP  in the last 8760 hours. HbA1C: No results for input(s): HGBA1C in the last 72 hours. CBG: No results for input(s): GLUCAP in the last 168 hours. Lipid Profile: No results for input(s): CHOL, HDL, LDLCALC, TRIG, CHOLHDL, LDLDIRECT in the last 72 hours. Thyroid Function Tests: No results for input(s): TSH, T4TOTAL, FREET4, T3FREE, THYROIDAB in the last 72 hours. Anemia Panel: Recent Labs    03/10/19 1258  VITAMINB12 733  FOLATE 33.3  FERRITIN 96  TIBC 269  IRON 42  RETICCTPCT 1.9   Sepsis Labs: No results for input(s): PROCALCITON, LATICACIDVEN in the last 168 hours.  Recent Results (from the past 240 hour(s))  SARS Coronavirus 2 [facility] order, Performed in [facility] hospital lab) Nasopharyngeal Nasopharyngeal Swab     Status: None   Collection Time: 03/08/19 10:44 AM   Specimen: Nasopharyngeal Swab  Result Value Ref Range Status   SARS Coronavirus 2 NEGATIVE NEGATIVE Final    Comment: (NOTE) If result is NEGATIVE SARS-CoV-2 target nucleic acids are NOT DETECTED. The SARS-CoV-2 RNA is generally detectable in upper and lower  respiratory specimens during the acute phase of infection. The lowest  concentration of SARS-CoV-2 viral copies this assay can detect is 250  copies / mL. A negative result does not preclude SARS-CoV-2 infection  and should not be used as the sole basis for treatment or other  patient management decisions.  A negative result may occur with  improper specimen collection / handling, submission of specimen other  than nasopharyngeal swab, presence of viral mutation(s) within the  areas targeted by this assay, and inadequate number of viral copies  (<250 copies / mL). A negative result must be combined with clinical  observations, patient history, and epidemiological information. If result is POSITIVE SARS-CoV-2 target nucleic acids are DETECTED. The SARS-CoV-2 RNA is generally detectable in upper and lower  respiratory specimens dur ing the acute  phase of infection.  Positive  results are indicative of active infection with SARS-CoV-2.  Clinical  correlation with patient history and other diagnostic information is  necessary to determine patient infection status.  Positive results do  not rule out bacterial infection or co-infection with other viruses. If result is PRESUMPTIVE POSTIVE SARS-CoV-2 nucleic acids MAY BE PRESENT.   A presumptive positive result was obtained on the submitted specimen  and confirmed on repeat testing.  While 2019 novel coronavirus  (SARS-CoV-2) nucleic acids may be present in the submitted sample  additional confirmatory testing may be necessary for epidemiological  and / or clinical management purposes  to differentiate between  SARS-CoV-2 and other Sarbecovirus currently known to infect humans.  If clinically indicated additional testing with  an alternate test  methodology [phone]([id number]) is advised. The SARS-CoV-2 RNA is generally  detectable in upper and lower respiratory sp ecimens during the acute  phase of infection. The expected result is Negative. Fact Sheet for Patients:  BoilerBrush.com.cyhttps://www.fda.gov/media/136312/download Fact Sheet for Healthcare Providers: https://pope.com/https://www.fda.gov/media/136313/download This test is not yet approved or cleared by the [place] FDA and has been authorized for detection and/or diagnosis of SARS-CoV-2 by FDA under an Emergency Use Authorization (EUA).  This EUA will remain in effect (meaning this test can be used) for the duration of the COVID-19 declaration under Section 564(b)(1) of the Act, 21 U.S.C. section 360bbb-3(b)(1), unless the authorization is terminated or revoked sooner. Performed at [facility], [address] [place], [place] [address]   Surgical pcr screen     Status: None   Collection Time: 03/08/19  2:55 PM   Specimen: Nasal Mucosa; Nasal Swab  Result Value Ref Range Status   MRSA, PCR NEGATIVE NEGATIVE Final   Staphylococcus  aureus NEGATIVE NEGATIVE Final    Comment: (NOTE) The Xpert SA Assay (FDA approved for NASAL specimens in patients 82 years of age and older), is one component of a comprehensive surveillance program. It is not intended to diagnose infection nor to guide or monitor treatment. Performed at [facility], [address] [place], [place] [address]   Culture, Urine     Status: None   Collection Time: 03/10/19 12:24 PM   Specimen: Urine, Catheterized  Result Value Ref Range Status   Specimen Description   Final    URINE, CATHETERIZED Performed at [facility], [address] [place], [place] [address]    Special Requests   Final    NONE Performed at [facility], [address] [place], [place] [address]    Culture   Final    NO GROWTH Performed at [facility], [address] [place], [place] [address]    Report Status 03/11/2019 FINAL  Final         Radiology Studies: No results found.      Scheduled Meds:  aspirin EC  325 mg Oral Q breakfast   carvedilol  3.125 mg Oral BID   docusate sodium  100 mg Oral BID   pantoprazole  40 mg Oral Daily   senna-docusate  2 tablet Oral QHS   Continuous Infusions:  lactated ringers 50 mL/hr at 03/11/19 1111   methocarbamol (ROBAXIN) IV 500 mg (03/08/19 1802)     LOS: 3 days    Time spent: 35 min    Burke Keelshristopher Saddie Sandeen, MD [facility]  If 7PM-7AM, please contact night-coverage  03/11/2019, 1:37 PM

## 2019-03-12 DIAGNOSIS — E559 Vitamin D deficiency, unspecified: Secondary | ICD-10-CM | POA: Diagnosis not present

## 2019-03-12 DIAGNOSIS — R58 Hemorrhage, not elsewhere classified: Secondary | ICD-10-CM | POA: Diagnosis not present

## 2019-03-12 DIAGNOSIS — D72829 Elevated white blood cell count, unspecified: Secondary | ICD-10-CM | POA: Diagnosis not present

## 2019-03-12 DIAGNOSIS — S7290XA Unspecified fracture of unspecified femur, initial encounter for closed fracture: Secondary | ICD-10-CM | POA: Diagnosis not present

## 2019-03-12 DIAGNOSIS — R278 Other lack of coordination: Secondary | ICD-10-CM | POA: Diagnosis not present

## 2019-03-12 DIAGNOSIS — D649 Anemia, unspecified: Secondary | ICD-10-CM | POA: Diagnosis not present

## 2019-03-12 DIAGNOSIS — Z7401 Bed confinement status: Secondary | ICD-10-CM | POA: Diagnosis not present

## 2019-03-12 DIAGNOSIS — S72462D Displaced supracondylar fracture with intracondylar extension of lower end of left femur, subsequent encounter for closed fracture with routine healing: Secondary | ICD-10-CM | POA: Diagnosis not present

## 2019-03-12 DIAGNOSIS — R2689 Other abnormalities of gait and mobility: Secondary | ICD-10-CM | POA: Diagnosis not present

## 2019-03-12 DIAGNOSIS — S72462S Displaced supracondylar fracture with intracondylar extension of lower end of left femur, sequela: Secondary | ICD-10-CM | POA: Diagnosis not present

## 2019-03-12 DIAGNOSIS — I82451 Acute embolism and thrombosis of right peroneal vein: Secondary | ICD-10-CM | POA: Diagnosis not present

## 2019-03-12 DIAGNOSIS — S72461D Displaced supracondylar fracture with intracondylar extension of lower end of right femur, subsequent encounter for closed fracture with routine healing: Secondary | ICD-10-CM | POA: Diagnosis not present

## 2019-03-12 DIAGNOSIS — R6889 Other general symptoms and signs: Secondary | ICD-10-CM | POA: Diagnosis not present

## 2019-03-12 DIAGNOSIS — S72001A Fracture of unspecified part of neck of right femur, initial encounter for closed fracture: Secondary | ICD-10-CM | POA: Diagnosis not present

## 2019-03-12 DIAGNOSIS — R000 Tachycardia, unspecified: Secondary | ICD-10-CM | POA: Diagnosis not present

## 2019-03-12 DIAGNOSIS — D500 Iron deficiency anemia secondary to blood loss (chronic): Secondary | ICD-10-CM | POA: Diagnosis not present

## 2019-03-12 DIAGNOSIS — E785 Hyperlipidemia, unspecified: Secondary | ICD-10-CM | POA: Diagnosis not present

## 2019-03-12 DIAGNOSIS — F0390 Unspecified dementia without behavioral disturbance: Secondary | ICD-10-CM | POA: Diagnosis not present

## 2019-03-12 DIAGNOSIS — D62 Acute posthemorrhagic anemia: Secondary | ICD-10-CM | POA: Diagnosis not present

## 2019-03-12 DIAGNOSIS — I10 Essential (primary) hypertension: Secondary | ICD-10-CM | POA: Diagnosis not present

## 2019-03-12 DIAGNOSIS — S72491A Other fracture of lower end of right femur, initial encounter for closed fracture: Secondary | ICD-10-CM | POA: Diagnosis not present

## 2019-03-12 DIAGNOSIS — S72491D Other fracture of lower end of right femur, subsequent encounter for closed fracture with routine healing: Secondary | ICD-10-CM | POA: Diagnosis not present

## 2019-03-12 DIAGNOSIS — R0902 Hypoxemia: Secondary | ICD-10-CM | POA: Diagnosis not present

## 2019-03-12 DIAGNOSIS — M2550 Pain in unspecified joint: Secondary | ICD-10-CM | POA: Diagnosis not present

## 2019-03-12 DIAGNOSIS — N390 Urinary tract infection, site not specified: Secondary | ICD-10-CM | POA: Diagnosis not present

## 2019-03-12 DIAGNOSIS — E039 Hypothyroidism, unspecified: Secondary | ICD-10-CM | POA: Diagnosis not present

## 2019-03-12 LAB — SARS CORONAVIRUS 2 (TAT 6-24 HRS): SARS Coronavirus 2: NEGATIVE

## 2019-03-12 MED ORDER — CARVEDILOL 3.125 MG PO TABS: 3.1250 mg | ORAL_TABLET | Freq: Two times a day (BID) | ORAL | 0 refills | Status: DC

## 2019-03-12 MED ORDER — METHOCARBAMOL 500 MG PO TABS: 500.0000 mg | ORAL_TABLET | Freq: Four times a day (QID) | ORAL | 0 refills | Status: AC | PRN

## 2019-03-12 MED ORDER — HYDRALAZINE HCL 20 MG/ML IJ SOLN
10.0000 mg | Freq: Four times a day (QID) | INTRAMUSCULAR | Status: DC | PRN
  Administered 2019-03-12: 10 mg via INTRAVENOUS

## 2019-03-12 MED ORDER — HYDROCODONE-ACETAMINOPHEN 5-325 MG PO TABS: 1.0000 | ORAL_TABLET | ORAL | 0 refills | Status: DC | PRN

## 2019-03-12 MED ORDER — CEFDINIR 300 MG PO CAPS: 300.0000 mg | ORAL_CAPSULE | Freq: Two times a day (BID) | ORAL | 0 refills | Status: AC

## 2019-03-12 MED ORDER — DOCUSATE SODIUM 100 MG PO CAPS: 100.0000 mg | ORAL_CAPSULE | Freq: Two times a day (BID) | ORAL | 0 refills | Status: DC

## 2019-03-12 NOTE — TOC Transition Note (Signed)
Transition of Care [facility]) - CM/SW Discharge Note   Patient Details  Name: Sarah Wolf MRN: [id number] Date of Birth: 29-Jun-1935  [facility]) CM/SW Contact:  Dessa Phi, RN Phone Number: 03/12/2019, 10:49 AM   Clinical Narrative:d/c today to SNF-[facility]-awaiting for rm#, & tel# for report. Faxed d/c summary to [facility].Will transport by [facility] once ready.      Final next level of care: Skilled Nursing Facility Barriers to Discharge: No Barriers Identified   Patient Goals and CMS Choice Patient states their goals for this hospitalization and ongoing recovery are:: get stronger CMS Medicare.gov Compare Post Acute Care list provided to:: Patient Represenative (must comment) Choice offered to / list presented to : [facility] POA / Guardian, Patient([name], Nephew Bud primary caregiver aware & agree of d/c today SNF-[facility])  Discharge Placement PASRR number recieved: 03/11/19            Patient chooses bed at: [facility] Patient to be transferred to facility by: [place] Name of family member notified: [name](POA), [name]-primary caregiver Patient and family notified of of transfer: 03/12/19  Discharge Plan and Services                                     Social Determinants of Health (SDOH) Interventions     Readmission Risk Interventions No flowsheet data found.

## 2019-03-12 NOTE — Progress Notes (Signed)
Patient is stable for discharge. Report called to Mia at [facility]. Awaiting [facility] to transport patient.   Abass Misener, Fraser Din 03/12/2019 2:43 PM

## 2019-03-12 NOTE — Progress Notes (Signed)
Patient ID: Sarah Wolf, female   DOB: [birth year], 84 y.o.   MRN: [id number] Looks good today.  Will be going to short-term skilled nursing today for rehab.  I did change her right LE dressing at the bedside and her incision looks good.

## 2019-03-12 NOTE — TOC Transition Note (Signed)
Transition of Care [facility]) - CM/SW Discharge Note   Patient Details  Name: Sarah Wolf MRN: [id number] Date of Birth: 1934-12-20  Transition of Care [facility]) CM/SW Contact:  Dessa Phi, RN Phone Number: 03/12/2019, 10:33 AM   Clinical Narrative: Aida Puffer for 8/25-8/27-auth#[id number] next review date 8/27-Crystal Cowan [phone] fax#[phone] w/update-Janie @ [facility] aware. POA-Steven Herzig-[phone],[name](primary caregiver/patient all agree to d/c to [facility] today. MD/Nsg aware. Will contact PTAR once d/c summary, has rm#, tel# for nurse to call report & Nsg ready.      Final next level of care: Skilled Nursing Facility Barriers to Discharge: No Barriers Identified   Patient Goals and CMS Choice Patient states their goals for this hospitalization and ongoing recovery are:: get stronger CMS Medicare.gov Compare Post Acute Care list provided to:: Patient Represenative (must comment) Choice offered to / list presented to : [facility] POA / Guardian, Patient([name], Nephew Bud primary caregiver aware & agree of d/c today SNF-[facility])  Discharge Placement PASRR number recieved: 03/11/19            Patient chooses bed at: [facility] Patient to be transferred to facility by: [place] Name of family member notified: [name](POA), [name]-primary caregiver Patient and family notified of of transfer: 03/12/19  Discharge Plan and Services                                     Social Determinants of Health (SDOH) Interventions     Readmission Risk Interventions No flowsheet data found.

## 2019-03-12 NOTE — Progress Notes (Signed)
PTAR arrived to pick up patient and she became anxious and excited. BP was 188/108. MD notified and orders received to give Hydralazine 10mg  IV and recheck BP. If improved, continue with discharge plan. F/u BP 142/86. PTAR ok to transport patient.   Sherly Brodbeck, Fraser Din 03/12/2019

## 2019-03-12 NOTE — Discharge Summary (Signed)
Physician Discharge Summary  Sarah GalVeral Wolf ZOX:[id number] DOB: 06-27-1935 DOA: 03/08/2019  PCP: Patient, No Pcp Per  Admit date: 03/08/2019 Discharge date: 03/12/2019  Admitted From: Inpatient Disposition: SNF  Recommendations for Outpatient Follow-up:  1. Follow up with PCP in 1-2 weeks 2. SCD's when in bed or chair  Home Health:No Equipment/Devices:per pt/ortho  Discharge Condition:Stable CODE STATUS:Full code Diet recommendation: Regular healthy diet  Brief/Interim Summary: 84 year old lady with no significant past medical history presents to ED with a mechanical fall at home on 8/21 and was found to have distal femur fracture. Orthopedics consulted and she underwent ORIF by Dr Magnus IvanBlackman on 8/21. Physical therapy recommended SNF placement for continued therapy.  Social work consult and awaiting placement  Hospital course: Closed displaced supracondylar fracture with intracondylar extension of lower end of left femur (HCC)/Distal comminuted femur fracture on the right: S/p ORIF by Dr Magnus IvanBlackman on 8/21.  Pain control with norco and tramadol in the hospital continue with Norco on discharge prescription provided Aspirin SCDs per orthopedics for DVT prophylaxis Physical therapy recommending SNF and social work consulted.- Planned placement care today for intensive postoperative PT in order to resume prior level of functioning   Leukocytosis with UTI;  U/a pos, added abx, will complete an additional 5 days   Anemia of blood loss from the surgery with a component of hemodilution.  Hemoglobin at 10.9 today , dropped from 13.6 on admission Has remained stable no acute intervention necessary continue to monitor as an outpatient   Hypokalemia:  Replaced, most recent 4.5  Hypertension, sinus tachycardia with sinus arrhythmia on admission:  - she was started on coreg with improvement in bp and HR.  - echocardiogram this admission showsThe left ventricle has normal systolic function  with an ejection fraction of 60-65%. The cavity size was normal. There is mild asymmetric left ventricular hypertrophy. Left ventricular diastolic function could not be evaluated secondary to atrial fibrillation. Patient would benefit from outpatient cardiology follow-up after postoperative recovery and physical therapy.  Discharge Diagnoses:  Active Problems:   Hip fracture (HCC)   Closed comminuted intra-articular fracture of distal femur, right, initial encounter (HCC)   Closed displaced supracondylar fracture with intracondylar extension of lower end of left femur [facility](HCC)    Discharge Instructions  Discharge Instructions    Diet - low sodium heart healthy   Complete by: As directed    Increase activity slowly   Complete by: As directed    Other Restrictions   Complete by: As directed    Postop weightbearing per ortho     Allergies as of 03/12/2019   No Known Allergies     Medication List    TAKE these medications   aspirin 325 MG EC tablet Take 1 tablet (325 mg total) by mouth daily with breakfast.   carvedilol 3.125 MG tablet Commonly known as: COREG Take 1 tablet (3.125 mg total) by mouth 2 (two) times daily.   cefdinir 300 MG capsule Commonly known as: OMNICEF Take 1 capsule (300 mg total) by mouth 2 (two) times daily for 5 days.   docusate sodium 100 MG capsule Commonly known as: COLACE Take 1 capsule (100 mg total) by mouth 2 (two) times daily.   doxylamine (Sleep) 25 MG tablet Commonly known as: UNISOM Take 25 mg by mouth at bedtime as needed for sleep.   HYDROcodone-acetaminophen 5-325 MG tablet Commonly known as: NORCO/VICODIN Take 1-2 tablets by mouth every 4 (four) hours as needed for moderate pain (pain score 4-6).  methocarbamol 500 MG tablet Commonly known as: ROBAXIN Take 1 tablet (500 mg total) by mouth every 6 (six) hours as needed for up to 5 days for muscle spasms.   naproxen sodium 220 MG tablet Commonly known as: ALEVE Take 220 mg by  mouth 2 (two) times daily as needed (pain/headache).      Follow-up Information    Kathryne Hitch, MD. Schedule an appointment as soon as possible for a visit in 2 week(s).   Specialty: Orthopedic Surgery Contact information: [address] [place] [address] [phone]          No Known Allergies  Consultations:  ortho   Procedures/Studies: Dg Chest Port 1 View  Result Date: 03/08/2019 CLINICAL DATA:  Preoperative evaluation for femur surgery EXAM: PORTABLE CHEST 1 VIEW COMPARISON:  None. FINDINGS: Elevation of left hemidiaphragm is noted. No focal infiltrate or sizable effusion is seen. Cardiac shadow is at the upper limits of normal in size. Aortic calcifications are noted. IMPRESSION: No acute abnormality noted. Electronically Signed   By: Alcide Clever M.D.   On: 03/08/2019 11:07   Dg Knee Complete 4 Views Right  Result Date: 03/08/2019 CLINICAL DATA:  Pain following fall EXAM: RIGHT KNEE - COMPLETE 4+ VIEW COMPARISON:  None. FINDINGS: Frontal, lateral, and bilateral oblique views were obtained. There is a comminuted fracture of the femur at the diaphysis-metaphysis junction. A fracture fragment extends parallel to the cortex to extend to the joint space just lateral to the midline of the distal femur. There is posterior and lateral displacement of the distal major fracture fragment with respect to the proximal major fracture fragment. No gross dislocation. No appreciable joint effusion. No appreciable joint space narrowing or erosion. IMPRESSION: Comminuted fracture distal femur with a fracture fragment extending to the level of the knee joint along the distal femoral condyle just lateral to the midline. There is posterior and lateral displacement of the major distal fracture fragment with respect to the major proximal fragment. No gross dislocation. Other visualized bony structures appear intact. Electronically Signed   By: Bretta Bang III M.D.   On:  03/08/2019 10:26   Dg C-arm 1-60 Min-no Report  Result Date: 03/08/2019 Fluoroscopy was utilized by the requesting physician.  No radiographic interpretation.   Dg Femur, Min 2 Views Right  Result Date: 03/08/2019 CLINICAL DATA:  ORIF of right femoral fracture EXAM: RIGHT FEMUR 2 VIEWS COMPARISON:  Film from earlier in the same day FLUOROSCOPY TIME:  Radiation Exposure Index (as provided by the fluoroscopic device): 3.25 mGy If the device does not provide the exposure index: Fluoroscopy Time:  42 seconds Number of Acquired Images:  5 FINDINGS: Lateral fixation sideplate is noted with multiple fixation screws. The fracture fragments have been reduced. No other focal abnormality is noted. IMPRESSION: Status post ORIF of distal femoral fracture Electronically Signed   By: Alcide Clever M.D.   On: 03/08/2019 17:35       Subjective: No acute events or night Patient stable reports he is ready for rehab/skilled nursing facility  Discharge Exam: Vitals:   03/12/19 0444 03/12/19 0536  BP: (!) 174/99 (!) 155/72  Pulse: 70 72  Resp: 18   Temp: 100.1 F (37.8 C)   SpO2: 95%    Vitals:   03/11/19 2051 03/12/19 0441 03/12/19 0444 03/12/19 0536  BP: 130/60 (!) 160/94 (!) 174/99 (!) 155/72  Pulse: 83  70 72  Resp:   18   Temp: 99.3 F (37.4 C)  100.1 F (  37.8 C)   TempSrc: Oral  Oral   SpO2: 97%  95%   Weight:      Height:        General: Pt is alert, awake, not in acute distress Cardiovascular: RRR, S1/S2 +, no rubs, no gallops Respiratory: CTA bilaterally, no wheezing, no rhonchi Abdominal: Soft, NT, ND, bowel sounds + Extremities: no edema, no cyanosis, limited range of motion on affected extremity although warm and well perfused neurovascularly intact    The results of significant diagnostics from this hospitalization (including imaging, microbiology, ancillary and laboratory) are listed below for reference.     Microbiology: Recent Results (from the past 240 hour(s))  SARS  Coronavirus 2 [facility] order, Performed in [facility] hospital lab) Nasopharyngeal Nasopharyngeal Swab     Status: None   Collection Time: 03/08/19 10:44 AM   Specimen: Nasopharyngeal Swab  Result Value Ref Range Status   SARS Coronavirus 2 NEGATIVE NEGATIVE Final    Comment: (NOTE) If result is NEGATIVE SARS-CoV-2 target nucleic acids are NOT DETECTED. The SARS-CoV-2 RNA is generally detectable in upper and lower  respiratory specimens during the acute phase of infection. The lowest  concentration of SARS-CoV-2 viral copies this assay can detect is 250  copies / mL. A negative result does not preclude SARS-CoV-2 infection  and should not be used as the sole basis for treatment or other  patient management decisions.  A negative result may occur with  improper specimen collection / handling, submission of specimen other  than nasopharyngeal swab, presence of viral mutation(s) within the  areas targeted by this assay, and inadequate number of viral copies  (<250 copies / mL). A negative result must be combined with clinical  observations, patient history, and epidemiological information. If result is POSITIVE SARS-CoV-2 target nucleic acids are DETECTED. The SARS-CoV-2 RNA is generally detectable in upper and lower  respiratory specimens dur ing the acute phase of infection.  Positive  results are indicative of active infection with SARS-CoV-2.  Clinical  correlation with patient history and other diagnostic information is  necessary to determine patient infection status.  Positive results do  not rule out bacterial infection or co-infection with other viruses. If result is PRESUMPTIVE POSTIVE SARS-CoV-2 nucleic acids MAY BE PRESENT.   A presumptive positive result was obtained on the submitted specimen  and confirmed on repeat testing.  While 2019 novel coronavirus  (SARS-CoV-2) nucleic acids may be present in the submitted sample  additional confirmatory testing may be necessary  for epidemiological  and / or clinical management purposes  to differentiate between  SARS-CoV-2 and other Sarbecovirus currently known to infect humans.  If clinically indicated additional testing with an alternate test  methodology [phone]) is advised. The SARS-CoV-2 RNA is generally  detectable in upper and lower respiratory sp ecimens during the acute  phase of infection. The expected result is Negative. Fact Sheet for Patients:  BoilerBrush.com.cy Fact Sheet for Healthcare Providers: https://pope.com/ This test is not yet approved or cleared by the [place] FDA and has been authorized for detection and/or diagnosis of SARS-CoV-2 by FDA under an Emergency Use Authorization (EUA).  This EUA will remain in effect (meaning this test can be used) for the duration of the COVID-19 declaration under Section 564(b)(1) of the Act, 21 U.S.C. section 360bbb-3(b)(1), unless the authorization is terminated or revoked sooner. Performed at [facility], [address] [place], [place] [address]   Surgical pcr screen     Status: None   Collection  Time: 03/08/19  2:55 PM   Specimen: Nasal Mucosa; Nasal Swab  Result Value Ref Range Status   MRSA, PCR NEGATIVE NEGATIVE Final   Staphylococcus aureus NEGATIVE NEGATIVE Final    Comment: (NOTE) The Xpert SA Assay (FDA approved for NASAL specimens in patients 82 years of age and older), is one component of a comprehensive surveillance program. It is not intended to diagnose infection nor to guide or monitor treatment. Performed at [facility], [place] [address], [place], [place] [address]   Culture, Urine     Status: None   Collection Time: 03/10/19 12:24 PM   Specimen: Urine, Catheterized  Result Value Ref Range Status   Specimen Description   Final    URINE, CATHETERIZED Performed at [facility] [address], [place], [place]  [address]    Special Requests   Final    NONE Performed at [facility], [place] [address] [place], [place] [address]    Culture   Final    NO GROWTH Performed at [facility], [place] [address], [place], [place] [address]    Report Status 03/11/2019 FINAL  Final  SARS CORONAVIRUS 2 (TAT 6-12 HRS) Nasal Swab Aptima Multi Swab     Status: None   Collection Time: 03/11/19 10:44 AM   Specimen: Aptima Multi Swab; Nasal Swab  Result Value Ref Range Status   SARS Coronavirus 2 NEGATIVE NEGATIVE Final    Comment: (NOTE) SARS-CoV-2 target nucleic acids are NOT DETECTED. The SARS-CoV-2 RNA is generally detectable in upper and lower respiratory specimens during the acute phase of infection. Negative results do not preclude SARS-CoV-2 infection, do not rule out co-infections with other pathogens, and should not be used as the sole basis for treatment or other patient management decisions. Negative results must be combined with clinical observations, patient history, and epidemiological information. The expected result is Negative. Fact Sheet for Patients: SugarRoll.be Fact Sheet for Healthcare Providers: https://www.woods-mathews.com/ This test is not yet approved or cleared by the [place] FDA and  has been authorized for detection and/or diagnosis of SARS-CoV-2 by FDA under an Emergency Use Authorization (EUA). This EUA will remain  in effect (meaning this test can be used) for the duration of the COVID-19 declaration under Section 56 4(b)(1) of the Act, 21 U.S.C. section 360bbb-3(b)(1), unless the authorization is terminated or revoked sooner. Performed at [facility], [place] [address], [place], [place] [address]      Labs: BNP (last 3 results) No results for input(s): BNP in the last 8760 hours. Basic Metabolic Panel: Recent Labs  Lab 03/08/19 1044 03/08/19 1318 03/10/19 0935 03/11/19 0535  NA 136 138 135  140  K 5.8* 3.9 3.4* 4.5  CL 100 101 99 104  CO2 27 28 27 27   GLUCOSE 129* 124* 135* 105*  BUN 17 18 17 17   CREATININE 0.87 0.69 0.68 0.56  CALCIUM 9.1 9.4 8.7* 8.6*   Liver Function Tests: No results for input(s): AST, ALT, ALKPHOS, BILITOT, PROT, ALBUMIN in the last 168 hours. No results for input(s): LIPASE, AMYLASE in the last 168 hours. No results for input(s): AMMONIA in the last 168 hours. CBC: Recent Labs  Lab 03/08/19 1044 03/10/19 0935 03/11/19 0535  WBC 15.7* 13.1* 9.9  NEUTROABS 13.9*  --   --   HGB 13.6 10.9* 10.2*  HCT 41.5 33.7* 31.3*  MCV 97.6 100.3* 100.0  PLT 233 160 162   Cardiac Enzymes: No results for input(s):  CKTOTAL, CKMB, CKMBINDEX, TROPONINI in the last 168 hours. BNP: Invalid input(s): POCBNP CBG: No results for input(s): GLUCAP in the last 168 hours. D-Dimer No results for input(s): DDIMER in the last 72 hours. Hgb A1c No results for input(s): HGBA1C in the last 72 hours. Lipid Profile No results for input(s): CHOL, HDL, LDLCALC, TRIG, CHOLHDL, LDLDIRECT in the last 72 hours. Thyroid function studies No results for input(s): TSH, T4TOTAL, T3FREE, THYROIDAB in the last 72 hours.  Invalid input(s): FREET3 Anemia work up Recent Labs    03/10/19 1258  VITAMINB12 733  FOLATE 33.3  FERRITIN 96  TIBC 269  IRON 42  RETICCTPCT 1.9   Urinalysis    Component Value Date/Time   COLORURINE YELLOW 03/10/2019 1224   APPEARANCEUR CLEAR 03/10/2019 1224   LABSPEC 1.006 03/10/2019 1224   PHURINE 6.0 03/10/2019 1224   GLUCOSEU NEGATIVE 03/10/2019 1224   HGBUR SMALL (A) 03/10/2019 1224   BILIRUBINUR NEGATIVE 03/10/2019 1224   KETONESUR NEGATIVE 03/10/2019 1224   PROTEINUR NEGATIVE 03/10/2019 1224   NITRITE NEGATIVE 03/10/2019 1224   LEUKOCYTESUR LARGE (A) 03/10/2019 1224   Sepsis Labs Invalid input(s): PROCALCITONIN,  WBC,  LACTICIDVEN Microbiology Recent Results (from the past 240 hour(s))  SARS Coronavirus 2 [facility] order, Performed in  [facility] hospital lab) Nasopharyngeal Nasopharyngeal Swab     Status: None   Collection Time: 03/08/19 10:44 AM   Specimen: Nasopharyngeal Swab  Result Value Ref Range Status   SARS Coronavirus 2 NEGATIVE NEGATIVE Final    Comment: (NOTE) If result is NEGATIVE SARS-CoV-2 target nucleic acids are NOT DETECTED. The SARS-CoV-2 RNA is generally detectable in upper and lower  respiratory specimens during the acute phase of infection. The lowest  concentration of SARS-CoV-2 viral copies this assay can detect is 250  copies / mL. A negative result does not preclude SARS-CoV-2 infection  and should not be used as the sole basis for treatment or other  patient management decisions.  A negative result may occur with  improper specimen collection / handling, submission of specimen other  than nasopharyngeal swab, presence of viral mutation(s) within the  areas targeted by this assay, and inadequate number of viral copies  (<250 copies / mL). A negative result must be combined with clinical  observations, patient history, and epidemiological information. If result is POSITIVE SARS-CoV-2 target nucleic acids are DETECTED. The SARS-CoV-2 RNA is generally detectable in upper and lower  respiratory specimens dur ing the acute phase of infection.  Positive  results are indicative of active infection with SARS-CoV-2.  Clinical  correlation with patient history and other diagnostic information is  necessary to determine patient infection status.  Positive results do  not rule out bacterial infection or co-infection with other viruses. If result is PRESUMPTIVE POSTIVE SARS-CoV-2 nucleic acids MAY BE PRESENT.   A presumptive positive result was obtained on the submitted specimen  and confirmed on repeat testing.  While 2019 novel coronavirus  (SARS-CoV-2) nucleic acids may be present in the submitted sample  additional confirmatory testing may be necessary for epidemiological  and / or clinical  management purposes  to differentiate between  SARS-CoV-2 and other Sarbecovirus currently known to infect humans.  If clinically indicated additional testing with an alternate test  methodology [id number]) is advised. The SARS-CoV-2 RNA is generally  detectable in upper and lower respiratory sp ecimens during the acute  phase of infection. The expected result is Negative. Fact Sheet for Patients:  BoilerBrush.com.cy Fact Sheet for Healthcare Providers: https://pope.com/ This test  is not yet approved or cleared by the [place] FDA and has been authorized for detection and/or diagnosis of SARS-CoV-2 by FDA under an Emergency Use Authorization (EUA).  This EUA will remain in effect (meaning this test can be used) for the duration of the COVID-19 declaration under Section 564(b)(1) of the Act, 21 U.S.C. section 360bbb-3(b)(1), unless the authorization is terminated or revoked sooner. Performed at [facility], [address] [place], [place] [address]   Surgical pcr screen     Status: None   Collection Time: 03/08/19  2:55 PM   Specimen: Nasal Mucosa; Nasal Swab  Result Value Ref Range Status   MRSA, PCR NEGATIVE NEGATIVE Final   Staphylococcus aureus NEGATIVE NEGATIVE Final    Comment: (NOTE) The Xpert SA Assay (FDA approved for NASAL specimens in patients 82 years of age and older), is one component of a comprehensive surveillance program. It is not intended to diagnose infection nor to guide or monitor treatment. Performed at [facility], [address] [place], [place] [address]   Culture, Urine     Status: None   Collection Time: 03/10/19 12:24 PM   Specimen: Urine, Catheterized  Result Value Ref Range Status   Specimen Description   Final    URINE, CATHETERIZED Performed at [facility], [address] [place], [place] [address]    Special Requests   Final     NONE Performed at [facility], [address] [place], [place] [address]    Culture   Final    NO GROWTH Performed at [facility], [address] [place], [place] [address]    Report Status 03/11/2019 FINAL  Final  SARS CORONAVIRUS 2 (TAT 6-12 HRS) Nasal Swab Aptima Multi Swab     Status: None   Collection Time: 03/11/19 10:44 AM   Specimen: Aptima Multi Swab; Nasal Swab  Result Value Ref Range Status   SARS Coronavirus 2 NEGATIVE NEGATIVE Final    Comment: (NOTE) SARS-CoV-2 target nucleic acids are NOT DETECTED. The SARS-CoV-2 RNA is generally detectable in upper and lower respiratory specimens during the acute phase of infection. Negative results do not preclude SARS-CoV-2 infection, do not rule out co-infections with other pathogens, and should not be used as the sole basis for treatment or other patient management decisions. Negative results must be combined with clinical observations, patient history, and epidemiological information. The expected result is Negative. Fact Sheet for Patients: HairSlick.nohttps://www.fda.gov/media/138098/download Fact Sheet for Healthcare Providers: quierodirigir.comhttps://www.fda.gov/media/138095/download This test is not yet approved or cleared by the [place] FDA and  has been authorized for detection and/or diagnosis of SARS-CoV-2 by FDA under an Emergency Use Authorization (EUA). This EUA will remain  in effect (meaning this test can be used) for the duration of the COVID-19 declaration under Section 56 4(b)(1) of the Act, 21 U.S.C. section 360bbb-3(b)(1), unless the authorization is terminated or revoked sooner. Performed at [facility], [address] [place], [place] [address]      Time coordinating discharge: Over 30 minutes  SIGNED:   Burke Keelshristopher Shatarra Wehling, MD  [facility] 03/12/2019, 10:34 AM Pager   If 7PM-7AM, please contact night-coverage [email or web address] Password [email or web address]

## 2019-03-12 NOTE — TOC Transition Note (Signed)
Transition of Care [facility]) - CM/SW Discharge Note   Patient Details  Name: Sarah Wolf MRN: [id number] Date of Birth: 06/20/1935  Transition of Care [facility]) CM/SW Contact:  Dessa Phi, RN Phone Number: 03/12/2019, 1:09 PM   Clinical Narrative:will schedule 2:30p pick up for [facility] rm#[id number], nurse call report to #[phone].       Final next level of care: Skilled Nursing Facility Barriers to Discharge: No Barriers Identified   Patient Goals and CMS Choice Patient states their goals for this hospitalization and ongoing recovery are:: get stronger CMS Medicare.gov Compare Post Acute Care list provided to:: Patient Represenative (must comment) Choice offered to / list presented to : [facility] POA / Guardian, Patient([name], Nephew Bud primary caregiver aware & agree of d/c today SNF-[facility])  Discharge Placement PASRR number recieved: 03/11/19            Patient chooses bed at: [facility] Patient to be transferred to facility by: [place] Name of family member notified: [name](POA), [name]-primary caregiver Patient and family notified of of transfer: 03/12/19  Discharge Plan and Services                                     Social Determinants of Health (SDOH) Interventions     Readmission Risk Interventions No flowsheet data found.

## 2019-03-13 DIAGNOSIS — S72491D Other fracture of lower end of right femur, subsequent encounter for closed fracture with routine healing: Secondary | ICD-10-CM | POA: Diagnosis not present

## 2019-03-13 DIAGNOSIS — N390 Urinary tract infection, site not specified: Secondary | ICD-10-CM | POA: Diagnosis not present

## 2019-03-13 DIAGNOSIS — I10 Essential (primary) hypertension: Secondary | ICD-10-CM | POA: Diagnosis not present

## 2019-03-13 DIAGNOSIS — D62 Acute posthemorrhagic anemia: Secondary | ICD-10-CM | POA: Diagnosis not present

## 2019-03-20 DIAGNOSIS — D62 Acute posthemorrhagic anemia: Secondary | ICD-10-CM | POA: Diagnosis not present

## 2019-03-20 DIAGNOSIS — S72491D Other fracture of lower end of right femur, subsequent encounter for closed fracture with routine healing: Secondary | ICD-10-CM | POA: Diagnosis not present

## 2019-03-20 DIAGNOSIS — N390 Urinary tract infection, site not specified: Secondary | ICD-10-CM | POA: Diagnosis not present

## 2019-03-20 DIAGNOSIS — I10 Essential (primary) hypertension: Secondary | ICD-10-CM | POA: Diagnosis not present

## 2019-03-27 DIAGNOSIS — R000 Tachycardia, unspecified: Secondary | ICD-10-CM | POA: Diagnosis not present

## 2019-03-27 DIAGNOSIS — I10 Essential (primary) hypertension: Secondary | ICD-10-CM | POA: Diagnosis not present

## 2019-03-27 DIAGNOSIS — S72491D Other fracture of lower end of right femur, subsequent encounter for closed fracture with routine healing: Secondary | ICD-10-CM | POA: Diagnosis not present

## 2019-03-27 DIAGNOSIS — D62 Acute posthemorrhagic anemia: Secondary | ICD-10-CM | POA: Diagnosis not present

## 2019-03-28 ENCOUNTER — Ambulatory Visit: Payer: Medicare Other

## 2019-03-28 ENCOUNTER — Ambulatory Visit (INDEPENDENT_AMBULATORY_CARE_PROVIDER_SITE_OTHER): Payer: Medicare Other | Admitting: Physician Assistant

## 2019-03-28 ENCOUNTER — Encounter: Payer: Self-pay | Admitting: Physician Assistant

## 2019-03-28 DIAGNOSIS — S72461D Displaced supracondylar fracture with intracondylar extension of lower end of right femur, subsequent encounter for closed fracture with routine healing: Secondary | ICD-10-CM | POA: Diagnosis not present

## 2019-03-28 DIAGNOSIS — S72462S Displaced supracondylar fracture with intracondylar extension of lower end of left femur, sequela: Secondary | ICD-10-CM

## 2019-03-28 NOTE — Progress Notes (Signed)
Office Visit Note   Patient: Sarah Wolf           Date of Birth: [birth year]           MRN: [id number] Visit Date: 03/28/2019              Requested by: No referring provider defined for this encounter. PCP: Patient, No Pcp Per   Assessment & Plan: Visit Diagnoses:  1. Closed displaced supracondylar fracture of distal end of left femur with intracondylar extension, sequela     Plan:   Staples removed Steri-Strips applied.  She is able to get the incision wet in shower.  Plan advance her weightbearing status to 50% on the right lower extremity.  Due to her swelling tenderness in the right calf will obtain a Doppler is performed tomorrow on 03/29/2019.  Questions were encouraged and answered at length.  We will see her back in 1 month sooner if there is any questions or concerns.  Follow-Up Instructions: Return in about 4 weeks (around 04/25/2019).   Orders:  Orders Placed This Encounter  Procedures  . XR FEMUR, MIN 2 VIEWS RIGHT  . VAS [place] LOWER EXTREMITY VENOUS (DVT)   No orders of the defined types were placed in this encounter.     Procedures: No procedures performed   Clinical Data: No additional findings.   Subjective: Chief Complaint  Patient presents with  . Right Leg - Routine Post Op    HPI Sarah Wolf returns today status post open reduction internal fixation distal femur fracture 03/08/2019.  Patient states she is overall doing well.  She is at a skilled nursing facility.  She is taking no pain medications.  She has been nonweightbearing on the right lower extremity.  She denies any fevers chills shortness of breath chest pain. Review of Systems Please see HPI otherwise negative or noncontributory.  Objective: Vital Signs: There were no vitals taken for this visit.  Physical Exam General: Well-developed well-nourished female no acute distress mood and affect appropriate. Psych: Alert and oriented x3 Ortho Exam Right femur surgical incision is well  approximated with staples no signs of infection.  No signs of dehiscence.  Good range of motion of the right knee actively.  She is +1 pitting edema of the right lower extremity and tenderness in the right calf.  Good dorsiflexion plantarflexion ankle. Specialty Comments:  No specialty comments available.  Imaging: Xr Femur, Min 2 Views Right  Result Date: 03/28/2019 Right femur: Multiple views showed the fracture to be in excellent position alignment.  No hardware failure.  No other fractures identified.  No bony abnormalities otherwise    PMFS History: Patient Active Problem List   Diagnosis Date Noted  . Hip fracture ([place]) 03/08/2019  . Closed comminuted intra-articular fracture of distal femur, right, initial encounter ([place])   . Closed displaced supracondylar fracture with intracondylar extension of lower end of left femur ([place])    History reviewed. No pertinent past medical history.  History reviewed. No pertinent family history.  Past Surgical History:  Procedure Laterality Date  . ORIF FEMUR FRACTURE Right 03/08/2019   Procedure: OPEN REDUCTION INTERNAL FIXATION (ORIF) DISTAL FEMUR FRACTURE;  Surgeon: Sarah Rossetti, MD;  Location: [facility];  Service: Orthopedics;  Laterality: Right;   Social History   Occupational History  . Not on file  Tobacco Use  . Smoking status: Never Smoker  . Smokeless tobacco: Never Used  Substance and Sexual Activity  . Alcohol use: Not on file  .  Drug use: Not on file  . Sexual activity: Not on file

## 2019-03-29 ENCOUNTER — Encounter (HOSPITAL_COMMUNITY): Payer: Self-pay | Admitting: Radiology

## 2019-03-29 ENCOUNTER — Ambulatory Visit (HOSPITAL_COMMUNITY)
Admission: RE | Admit: 2019-03-29 | Discharge: 2019-03-29 | Disposition: A | Discharge status: Home / Self Care | Payer: Medicare Other | Source: Ambulatory Visit | Attending: Cardiology | Admitting: Cardiology

## 2019-03-29 ENCOUNTER — Other Ambulatory Visit: Payer: Self-pay | Admitting: Physician Assistant

## 2019-03-29 ENCOUNTER — Other Ambulatory Visit: Payer: Self-pay

## 2019-03-29 ENCOUNTER — Telehealth: Payer: Self-pay

## 2019-03-29 DIAGNOSIS — S72491A Other fracture of lower end of right femur, initial encounter for closed fracture: Secondary | ICD-10-CM | POA: Diagnosis not present

## 2019-03-29 DIAGNOSIS — S72462S Displaced supracondylar fracture with intracondylar extension of lower end of left femur, sequela: Secondary | ICD-10-CM

## 2019-03-29 DIAGNOSIS — D649 Anemia, unspecified: Secondary | ICD-10-CM | POA: Diagnosis not present

## 2019-03-29 DIAGNOSIS — S7290XA Unspecified fracture of unspecified femur, initial encounter for closed fracture: Secondary | ICD-10-CM | POA: Diagnosis not present

## 2019-03-29 DIAGNOSIS — I10 Essential (primary) hypertension: Secondary | ICD-10-CM | POA: Diagnosis not present

## 2019-03-29 MED ORDER — RIVAROXABAN (XARELTO) VTE STARTER PACK (15 & 20 MG): ORAL_TABLET | ORAL | 0 refills | Status: DC

## 2019-03-29 NOTE — Telephone Encounter (Signed)
Patient seen in office yesterday by Artis Delay. He referred her for doppler r/o DVT. She was diagnosed with right calf perineal vein DVT. I spoke with Artis Delay and he wanted patient to being xarelto 15mg  bid x 21 days then 20mg  daily for the next 3 months. I called [facility] where patient has been staying and spoke with nurse Mardene Celeste and advised of all of this. I also called patient and advised and she requested that this medication be sent to pharmacy for her to pick up because she is leaving facility on Monday due to insurance not covering any more days for her to stay at facility.   Sent to [facility] per patients request.

## 2019-03-29 NOTE — Progress Notes (Unsigned)
Right lower venous has been completed and is positive for DVT.  Preliminary results can be found under CV proc through chart review.   Sharlett Iles, RVT [facility]

## 2019-04-01 DIAGNOSIS — D62 Acute posthemorrhagic anemia: Secondary | ICD-10-CM | POA: Diagnosis not present

## 2019-04-01 DIAGNOSIS — I82451 Acute embolism and thrombosis of right peroneal vein: Secondary | ICD-10-CM | POA: Diagnosis not present

## 2019-04-01 DIAGNOSIS — S72491D Other fracture of lower end of right femur, subsequent encounter for closed fracture with routine healing: Secondary | ICD-10-CM | POA: Diagnosis not present

## 2019-04-01 DIAGNOSIS — I10 Essential (primary) hypertension: Secondary | ICD-10-CM | POA: Diagnosis not present

## 2019-04-02 DIAGNOSIS — R278 Other lack of coordination: Secondary | ICD-10-CM | POA: Diagnosis not present

## 2019-04-02 DIAGNOSIS — D62 Acute posthemorrhagic anemia: Secondary | ICD-10-CM | POA: Diagnosis not present

## 2019-04-02 DIAGNOSIS — R2689 Other abnormalities of gait and mobility: Secondary | ICD-10-CM | POA: Diagnosis not present

## 2019-04-02 DIAGNOSIS — S72491D Other fracture of lower end of right femur, subsequent encounter for closed fracture with routine healing: Secondary | ICD-10-CM | POA: Diagnosis not present

## 2019-04-22 DIAGNOSIS — R278 Other lack of coordination: Secondary | ICD-10-CM | POA: Diagnosis not present

## 2019-04-22 DIAGNOSIS — R2689 Other abnormalities of gait and mobility: Secondary | ICD-10-CM | POA: Diagnosis not present

## 2019-04-22 DIAGNOSIS — S72491D Other fracture of lower end of right femur, subsequent encounter for closed fracture with routine healing: Secondary | ICD-10-CM | POA: Diagnosis not present

## 2019-04-22 DIAGNOSIS — S72462D Displaced supracondylar fracture with intracondylar extension of lower end of left femur, subsequent encounter for closed fracture with routine healing: Secondary | ICD-10-CM | POA: Diagnosis not present

## 2019-04-25 ENCOUNTER — Ambulatory Visit (INDEPENDENT_AMBULATORY_CARE_PROVIDER_SITE_OTHER): Payer: Medicare Other | Admitting: Physician Assistant

## 2019-04-25 ENCOUNTER — Other Ambulatory Visit: Payer: Self-pay

## 2019-04-25 ENCOUNTER — Ambulatory Visit (INDEPENDENT_AMBULATORY_CARE_PROVIDER_SITE_OTHER): Payer: Medicare Other

## 2019-04-25 ENCOUNTER — Encounter: Payer: Self-pay | Admitting: Physician Assistant

## 2019-04-25 DIAGNOSIS — S72491A Other fracture of lower end of right femur, initial encounter for closed fracture: Secondary | ICD-10-CM | POA: Diagnosis not present

## 2019-04-25 MED ORDER — RIVAROXABAN 20 MG PO TABS: 20.0000 mg | ORAL_TABLET | Freq: Every day | ORAL | 0 refills | Status: DC

## 2019-04-25 NOTE — Progress Notes (Addendum)
Office Visit Note   Patient: Sarah Wolf           Date of Birth: 1935-05-10           MRN: [id number] Visit Date: 04/25/2019              Requested by: No referring provider defined for this encounter. PCP: Patient, No Pcp Per   Assessment & Plan: Visit Diagnoses:  1. Closed comminuted intra-articular fracture of distal femur, right, initial encounter (HCC)     Plan: Due to the fact that she did not receive any Xarelto to go home on even though it was called into the skilled facility will call Xarelto and for her 20 mg p.o. once daily for the next 3 months.  She will begin taking this immediately.  She is weightbearing as tolerated on the left lower extremity.  We will see her back in 1 month at that time we will obtain AP and lateral views of the right femur.  Questions are encouraged and answered at length today.  Scar tissue mobilization encouraged.  Follow-Up Instructions: Return in about 4 weeks (around 05/23/2019) for Radiographs.   Orders:  Orders Placed This Encounter  Procedures  . XR FEMUR, MIN 2 VIEWS RIGHT   Meds ordered this encounter  Medications  . rivaroxaban (XARELTO) 20 MG TABS tablet    Sig: Take 1 tablet (20 mg total) by mouth daily with supper.    Dispense:  90 tablet    Refill:  0      Procedures: No procedures performed   Clinical Data: No additional findings.   Subjective: Chief Complaint  Patient presents with  . Right Leg - Follow-up    HPI Sarah Wolf returns today status post open reduction internal fixation of a right distal femur fracture on 03/08/2019.  She is overall doing well she been 50% weightbearing.  She is now at home.  She states she has not been on Xarelto for the last 5 days.  Skilled facility finished up with a given her but is not aware she needs to be on any additional Xarelto for treatment of the DVT involving her right lower extremity.  She is had no shortness of breath fevers chills.  Review of Systems See HPI otherwise  negative  Objective: Vital Signs: There were no vitals taken for this visit.  Physical Exam Constitutional:      Appearance: She is not ill-appearing or diaphoretic.  Pulmonary:     Effort: Pulmonary effort is normal.  Neurological:     Mental Status: She is alert and oriented to person, place, and time.     Ortho Exam Right distal femur surgical incisions healing well no signs of infection.  She has nonpainful internal and external rotation of the right hip.  Good range of motion the right knee without pain.  Specialty Comments:  No specialty comments available.  Imaging: Xr Femur, Min 2 Views Right  Result Date: 04/25/2019 AP lateral views right femur: No hardware failure.  Further consolidation fracture seen.  Overall excellent position alignment right distal femur fracture near-anatomic.    PMFS History: Patient Active Problem List   Diagnosis Date Noted  . Hip fracture (HCC) 03/08/2019  . Closed comminuted intra-articular fracture of distal femur, right, initial encounter (HCC)   . Closed displaced supracondylar fracture with intracondylar extension of lower end of left femur (HCC)    History reviewed. No pertinent past medical history.  History reviewed. No pertinent family history.  Past  Surgical History:  Procedure Laterality Date  . ORIF FEMUR FRACTURE Right 03/08/2019   Procedure: OPEN REDUCTION INTERNAL FIXATION (ORIF) DISTAL FEMUR FRACTURE;  Surgeon: Mcarthur Rossetti, MD;  Location: [facility];  Service: Orthopedics;  Laterality: Right;   Social History   Occupational History  . Not on file  Tobacco Use  . Smoking status: Never Smoker  . Smokeless tobacco: Never Used  Substance and Sexual Activity  . Alcohol use: Not on file  . Drug use: Not on file  . Sexual activity: Not on file

## 2019-05-23 ENCOUNTER — Encounter: Payer: Self-pay | Admitting: Physician Assistant

## 2019-05-23 ENCOUNTER — Ambulatory Visit (INDEPENDENT_AMBULATORY_CARE_PROVIDER_SITE_OTHER): Payer: Medicare Other | Admitting: Physician Assistant

## 2019-05-23 ENCOUNTER — Ambulatory Visit (INDEPENDENT_AMBULATORY_CARE_PROVIDER_SITE_OTHER): Payer: Medicare Other

## 2019-05-23 DIAGNOSIS — S72462D Displaced supracondylar fracture with intracondylar extension of lower end of left femur, subsequent encounter for closed fracture with routine healing: Secondary | ICD-10-CM | POA: Diagnosis not present

## 2019-05-23 DIAGNOSIS — S72491D Other fracture of lower end of right femur, subsequent encounter for closed fracture with routine healing: Secondary | ICD-10-CM

## 2019-05-23 DIAGNOSIS — R2689 Other abnormalities of gait and mobility: Secondary | ICD-10-CM | POA: Diagnosis not present

## 2019-05-23 DIAGNOSIS — R278 Other lack of coordination: Secondary | ICD-10-CM | POA: Diagnosis not present

## 2019-05-23 MED ORDER — METHOCARBAMOL 500 MG PO TABS: 500.0000 mg | ORAL_TABLET | Freq: Three times a day (TID) | ORAL | 1 refills | Status: DC

## 2019-05-23 NOTE — Progress Notes (Signed)
Office Visit Note   Patient: Sarah Wolf           Date of Birth: 21-Aug-1934           MRN: [id number] Visit Date: 05/23/2019              Requested by: No referring provider defined for this encounter. PCP: Patient, No Pcp Per   Assessment & Plan: Visit Diagnoses:  1. Closed comminuted intra-articular fracture of distal end of right femur with routine healing, subsequent encounter     Plan: She will continue to work on range of motion strengthening of right lower extremity.  We will see her back in a month and obtain AP and lateral views of the right femur.  She will need further follow-up of her DVT with an ultrasound to evaluate clot in the near future.  She will remain on her Xarelto for now.  Follow-Up Instructions: Return in about 4 weeks (around 06/20/2019) for Radiographs.   Orders:  Orders Placed This Encounter  Procedures  . XR FEMUR, MIN 2 VIEWS RIGHT   Meds ordered this encounter  Medications  . methocarbamol (ROBAXIN) 500 MG tablet    Sig: Take 1 tablet (500 mg total) by mouth 3 (three) times daily.    Dispense:  40 tablet    Refill:  1      Procedures: No procedures performed   Clinical Data: No additional findings.   Subjective: Chief Complaint  Patient presents with  . Right Leg - Follow-up    HPI Sarah Wolf returns today follow-up of her right distal femur fracture.  She underwent open reduction internal fixation of the distal femur fracture on 03/08/2019.  She is overall doing well.  She is ambulating with a cane.  She states that she does ambulate without any assistive device sometimes in the home.  She is having some discomfort but no real pain swelling refill her Robaxin.  She is on Xarelto for a DVT. Review of Systems Denies any fevers chills shortness of breath chest pain  Objective: Vital Signs: There were no vitals taken for this visit.  Physical Exam General well-developed well-nourished female no acute distress ambulates with cane  slight antalgic gait. Ortho Exam Right hip good range of motion.  She has full extension of the right knee and full flexion right knee.  Dorsiflexion plantarflexion ankle on the right is intact.  Surgical incision lateral aspect of distal right femur is healing well. Specialty Comments:  No specialty comments available.  Imaging: Xr Femur, Min 2 Views Right  Result Date: 05/23/2019 Right femur: Multiple views shows patient be status post open reduction internal fixation of the right distal femur fracture.  There is abundant callus formation.  Fractures seen on the lateral view.  Arthrosclerosis noted.  No acute fractures otherwise.     PMFS History: Patient Active Problem List   Diagnosis Date Noted  . Hip fracture ([place]) 03/08/2019  . Closed comminuted intra-articular fracture of distal femur, right, initial encounter ([place])   . Closed displaced supracondylar fracture with intracondylar extension of lower end of left femur ([place])    History reviewed. No pertinent past medical history.  History reviewed. No pertinent family history.  Past Surgical History:  Procedure Laterality Date  . ORIF FEMUR FRACTURE Right 03/08/2019   Procedure: OPEN REDUCTION INTERNAL FIXATION (ORIF) DISTAL FEMUR FRACTURE;  Surgeon: Mcarthur Rossetti, MD;  Location: [facility];  Service: Orthopedics;  Laterality: Right;   Social History   Occupational History  .  Not on file  Tobacco Use  . Smoking status: Never Smoker  . Smokeless tobacco: Never Used  Substance and Sexual Activity  . Alcohol use: Not on file  . Drug use: Not on file  . Sexual activity: Not on file

## 2019-05-31 DIAGNOSIS — S72491D Other fracture of lower end of right femur, subsequent encounter for closed fracture with routine healing: Secondary | ICD-10-CM | POA: Diagnosis not present

## 2019-05-31 DIAGNOSIS — D62 Acute posthemorrhagic anemia: Secondary | ICD-10-CM | POA: Diagnosis not present

## 2019-05-31 DIAGNOSIS — R278 Other lack of coordination: Secondary | ICD-10-CM | POA: Diagnosis not present

## 2019-05-31 DIAGNOSIS — R2689 Other abnormalities of gait and mobility: Secondary | ICD-10-CM | POA: Diagnosis not present

## 2019-06-02 DIAGNOSIS — R2689 Other abnormalities of gait and mobility: Secondary | ICD-10-CM | POA: Diagnosis not present

## 2019-06-02 DIAGNOSIS — D62 Acute posthemorrhagic anemia: Secondary | ICD-10-CM | POA: Diagnosis not present

## 2019-06-02 DIAGNOSIS — R278 Other lack of coordination: Secondary | ICD-10-CM | POA: Diagnosis not present

## 2019-06-02 DIAGNOSIS — S72491D Other fracture of lower end of right femur, subsequent encounter for closed fracture with routine healing: Secondary | ICD-10-CM | POA: Diagnosis not present

## 2019-06-20 ENCOUNTER — Other Ambulatory Visit: Payer: Self-pay

## 2019-06-20 ENCOUNTER — Ambulatory Visit (INDEPENDENT_AMBULATORY_CARE_PROVIDER_SITE_OTHER): Payer: Medicare Other | Admitting: Physician Assistant

## 2019-06-20 ENCOUNTER — Encounter: Payer: Self-pay | Admitting: Physician Assistant

## 2019-06-20 ENCOUNTER — Ambulatory Visit: Payer: Self-pay

## 2019-06-20 DIAGNOSIS — S72491D Other fracture of lower end of right femur, subsequent encounter for closed fracture with routine healing: Secondary | ICD-10-CM | POA: Diagnosis not present

## 2019-06-20 MED ORDER — METHOCARBAMOL 500 MG PO TABS: 500.0000 mg | ORAL_TABLET | Freq: Three times a day (TID) | ORAL | 0 refills | Status: DC

## 2019-06-20 NOTE — Progress Notes (Signed)
   Office Visit Note   Patient: Sarah Wolf           Date of Birth: 08/21/1934           MRN: [id number] Visit Date: 06/20/2019              Requested by: No referring provider defined for this encounter. PCP: Patient, No Pcp Per   Assessment & Plan: Visit Diagnoses:  1. Closed comminuted intra-articular fracture of distal end of right femur with routine healing, subsequent encounter     Plan: We will have her follow-up with [place] as needed.  If she has any questions concerns she will return.    Follow-Up Instructions: Return if symptoms worsen or fail to improve.   Orders:  Orders Placed This Encounter  Procedures  . XR FEMUR, MIN 2 VIEWS RIGHT   No orders of the defined types were placed in this encounter.     Procedures: No procedures performed   Clinical Data: No additional findings.   Subjective: Chief Complaint  Patient presents with  . Right Leg - Follow-up    HPI Mrs. Sarah Wolf returns today 3-1/2 months status post right distal femur open reduction internal fixation.  She is overall doing very well.  She has no complaints.  Occasional  spasm in the leg .  She using a cane to ambulate but at times goes without a cane. Review of Systems No fevers chills shortness of breath chest pain  Objective: Vital Signs: There were no vitals taken for this visit.  Physical Exam Constitutional:      Appearance: She is not ill-appearing or diaphoretic.  Pulmonary:     Effort: Pulmonary effort is normal.  Neurological:     Mental Status: She is alert and oriented to person, place, and time.  Psychiatric:        Mood and Affect: Mood normal.     Ortho Exam Right leg surgical incisions well-healed no signs of infection.  She has full extension of the knee full flexion knee without pain.  No instability valgus varus stressing.  Ambulates with a cane with a nonantalgic gait. Specialty Comments:  No specialty comments available.  Imaging: Xr Femur, Min 2 Views Right   Result Date: 06/20/2019 Right femur multiple views: Distal femur fracture overall in good position alignment.  Significant callus formation seen.  No hardware failure.  Right hip is well located.  Knee joint is well-maintained.    PMFS History: Patient Active Problem List   Diagnosis Date Noted  . Hip fracture ([place]) 03/08/2019  . Closed comminuted intra-articular fracture of distal femur, right, initial encounter ([place])   . Closed displaced supracondylar fracture with intracondylar extension of lower end of left femur ([place])    History reviewed. No pertinent past medical history.  History reviewed. No pertinent family history.  Past Surgical History:  Procedure Laterality Date  . ORIF FEMUR FRACTURE Right 03/08/2019   Procedure: OPEN REDUCTION INTERNAL FIXATION (ORIF) DISTAL FEMUR FRACTURE;  Surgeon: Mcarthur Rossetti, MD;  Location: [facility];  Service: Orthopedics;  Laterality: Right;   Social History   Occupational History  . Not on file  Tobacco Use  . Smoking status: Never Smoker  . Smokeless tobacco: Never Used  Substance and Sexual Activity  . Alcohol use: Not on file  . Drug use: Not on file  . Sexual activity: Not on file

## 2019-06-20 NOTE — Addendum Note (Signed)
Addended by: Erskine Emery on: 06/20/2019 02:47 PM   Modules accepted: Orders

## 2019-06-22 DIAGNOSIS — S72462D Displaced supracondylar fracture with intracondylar extension of lower end of left femur, subsequent encounter for closed fracture with routine healing: Secondary | ICD-10-CM | POA: Diagnosis not present

## 2019-06-22 DIAGNOSIS — R2689 Other abnormalities of gait and mobility: Secondary | ICD-10-CM | POA: Diagnosis not present

## 2019-06-22 DIAGNOSIS — S72491D Other fracture of lower end of right femur, subsequent encounter for closed fracture with routine healing: Secondary | ICD-10-CM | POA: Diagnosis not present

## 2019-06-22 DIAGNOSIS — R278 Other lack of coordination: Secondary | ICD-10-CM | POA: Diagnosis not present

## 2019-06-30 DIAGNOSIS — R278 Other lack of coordination: Secondary | ICD-10-CM | POA: Diagnosis not present

## 2019-06-30 DIAGNOSIS — S72491D Other fracture of lower end of right femur, subsequent encounter for closed fracture with routine healing: Secondary | ICD-10-CM | POA: Diagnosis not present

## 2019-06-30 DIAGNOSIS — D62 Acute posthemorrhagic anemia: Secondary | ICD-10-CM | POA: Diagnosis not present

## 2019-06-30 DIAGNOSIS — R2689 Other abnormalities of gait and mobility: Secondary | ICD-10-CM | POA: Diagnosis not present

## 2019-07-02 DIAGNOSIS — S72491D Other fracture of lower end of right femur, subsequent encounter for closed fracture with routine healing: Secondary | ICD-10-CM | POA: Diagnosis not present

## 2019-07-02 DIAGNOSIS — D62 Acute posthemorrhagic anemia: Secondary | ICD-10-CM | POA: Diagnosis not present

## 2019-07-02 DIAGNOSIS — R2689 Other abnormalities of gait and mobility: Secondary | ICD-10-CM | POA: Diagnosis not present

## 2019-07-02 DIAGNOSIS — R278 Other lack of coordination: Secondary | ICD-10-CM | POA: Diagnosis not present

## 2019-07-23 DIAGNOSIS — S72491D Other fracture of lower end of right femur, subsequent encounter for closed fracture with routine healing: Secondary | ICD-10-CM | POA: Diagnosis not present

## 2019-07-23 DIAGNOSIS — R2689 Other abnormalities of gait and mobility: Secondary | ICD-10-CM | POA: Diagnosis not present

## 2019-07-23 DIAGNOSIS — R278 Other lack of coordination: Secondary | ICD-10-CM | POA: Diagnosis not present

## 2019-07-23 DIAGNOSIS — S72462D Displaced supracondylar fracture with intracondylar extension of lower end of left femur, subsequent encounter for closed fracture with routine healing: Secondary | ICD-10-CM | POA: Diagnosis not present

## 2019-07-31 DIAGNOSIS — D62 Acute posthemorrhagic anemia: Secondary | ICD-10-CM | POA: Diagnosis not present

## 2019-07-31 DIAGNOSIS — R2689 Other abnormalities of gait and mobility: Secondary | ICD-10-CM | POA: Diagnosis not present

## 2019-07-31 DIAGNOSIS — S72491D Other fracture of lower end of right femur, subsequent encounter for closed fracture with routine healing: Secondary | ICD-10-CM | POA: Diagnosis not present

## 2019-07-31 DIAGNOSIS — R278 Other lack of coordination: Secondary | ICD-10-CM | POA: Diagnosis not present

## 2019-08-02 DIAGNOSIS — R278 Other lack of coordination: Secondary | ICD-10-CM | POA: Diagnosis not present

## 2019-08-02 DIAGNOSIS — D62 Acute posthemorrhagic anemia: Secondary | ICD-10-CM | POA: Diagnosis not present

## 2019-08-02 DIAGNOSIS — S72491D Other fracture of lower end of right femur, subsequent encounter for closed fracture with routine healing: Secondary | ICD-10-CM | POA: Diagnosis not present

## 2019-08-02 DIAGNOSIS — R2689 Other abnormalities of gait and mobility: Secondary | ICD-10-CM | POA: Diagnosis not present

## 2019-08-23 DIAGNOSIS — R2689 Other abnormalities of gait and mobility: Secondary | ICD-10-CM | POA: Diagnosis not present

## 2019-08-23 DIAGNOSIS — S72462D Displaced supracondylar fracture with intracondylar extension of lower end of left femur, subsequent encounter for closed fracture with routine healing: Secondary | ICD-10-CM | POA: Diagnosis not present

## 2019-08-23 DIAGNOSIS — R278 Other lack of coordination: Secondary | ICD-10-CM | POA: Diagnosis not present

## 2019-08-23 DIAGNOSIS — S72491D Other fracture of lower end of right femur, subsequent encounter for closed fracture with routine healing: Secondary | ICD-10-CM | POA: Diagnosis not present

## 2019-08-31 DIAGNOSIS — D62 Acute posthemorrhagic anemia: Secondary | ICD-10-CM | POA: Diagnosis not present

## 2019-08-31 DIAGNOSIS — S72491D Other fracture of lower end of right femur, subsequent encounter for closed fracture with routine healing: Secondary | ICD-10-CM | POA: Diagnosis not present

## 2019-08-31 DIAGNOSIS — R2689 Other abnormalities of gait and mobility: Secondary | ICD-10-CM | POA: Diagnosis not present

## 2019-08-31 DIAGNOSIS — R278 Other lack of coordination: Secondary | ICD-10-CM | POA: Diagnosis not present

## 2019-09-02 DIAGNOSIS — D62 Acute posthemorrhagic anemia: Secondary | ICD-10-CM | POA: Diagnosis not present

## 2019-09-02 DIAGNOSIS — S72491D Other fracture of lower end of right femur, subsequent encounter for closed fracture with routine healing: Secondary | ICD-10-CM | POA: Diagnosis not present

## 2019-09-02 DIAGNOSIS — R2689 Other abnormalities of gait and mobility: Secondary | ICD-10-CM | POA: Diagnosis not present

## 2019-09-02 DIAGNOSIS — R278 Other lack of coordination: Secondary | ICD-10-CM | POA: Diagnosis not present

## 2019-09-13 DIAGNOSIS — I10 Essential (primary) hypertension: Secondary | ICD-10-CM | POA: Diagnosis not present

## 2019-09-13 DIAGNOSIS — Z8781 Personal history of (healed) traumatic fracture: Secondary | ICD-10-CM | POA: Diagnosis not present

## 2019-09-13 DIAGNOSIS — I252 Old myocardial infarction: Secondary | ICD-10-CM | POA: Diagnosis not present

## 2019-09-13 DIAGNOSIS — R030 Elevated blood-pressure reading, without diagnosis of hypertension: Secondary | ICD-10-CM | POA: Diagnosis not present

## 2019-09-20 DIAGNOSIS — S72462D Displaced supracondylar fracture with intracondylar extension of lower end of left femur, subsequent encounter for closed fracture with routine healing: Secondary | ICD-10-CM | POA: Diagnosis not present

## 2019-09-20 DIAGNOSIS — S72491D Other fracture of lower end of right femur, subsequent encounter for closed fracture with routine healing: Secondary | ICD-10-CM | POA: Diagnosis not present

## 2019-09-20 DIAGNOSIS — R278 Other lack of coordination: Secondary | ICD-10-CM | POA: Diagnosis not present

## 2019-09-20 DIAGNOSIS — R2689 Other abnormalities of gait and mobility: Secondary | ICD-10-CM | POA: Diagnosis not present

## 2019-09-28 DIAGNOSIS — D62 Acute posthemorrhagic anemia: Secondary | ICD-10-CM | POA: Diagnosis not present

## 2019-09-28 DIAGNOSIS — R278 Other lack of coordination: Secondary | ICD-10-CM | POA: Diagnosis not present

## 2019-09-28 DIAGNOSIS — R2689 Other abnormalities of gait and mobility: Secondary | ICD-10-CM | POA: Diagnosis not present

## 2019-09-28 DIAGNOSIS — S72491D Other fracture of lower end of right femur, subsequent encounter for closed fracture with routine healing: Secondary | ICD-10-CM | POA: Diagnosis not present

## 2019-09-30 DIAGNOSIS — S72491D Other fracture of lower end of right femur, subsequent encounter for closed fracture with routine healing: Secondary | ICD-10-CM | POA: Diagnosis not present

## 2019-09-30 DIAGNOSIS — D62 Acute posthemorrhagic anemia: Secondary | ICD-10-CM | POA: Diagnosis not present

## 2019-09-30 DIAGNOSIS — R278 Other lack of coordination: Secondary | ICD-10-CM | POA: Diagnosis not present

## 2019-09-30 DIAGNOSIS — R2689 Other abnormalities of gait and mobility: Secondary | ICD-10-CM | POA: Diagnosis not present

## 2019-10-21 DIAGNOSIS — R278 Other lack of coordination: Secondary | ICD-10-CM | POA: Diagnosis not present

## 2019-10-21 DIAGNOSIS — S72491D Other fracture of lower end of right femur, subsequent encounter for closed fracture with routine healing: Secondary | ICD-10-CM | POA: Diagnosis not present

## 2019-10-21 DIAGNOSIS — S72462D Displaced supracondylar fracture with intracondylar extension of lower end of left femur, subsequent encounter for closed fracture with routine healing: Secondary | ICD-10-CM | POA: Diagnosis not present

## 2019-10-21 DIAGNOSIS — R2689 Other abnormalities of gait and mobility: Secondary | ICD-10-CM | POA: Diagnosis not present

## 2021-01-08 DIAGNOSIS — H34812 Central retinal vein occlusion, left eye, with macular edema: Secondary | ICD-10-CM | POA: Diagnosis not present

## 2021-01-13 DIAGNOSIS — H34812 Central retinal vein occlusion, left eye, with macular edema: Secondary | ICD-10-CM | POA: Diagnosis not present

## 2021-02-24 DIAGNOSIS — H34812 Central retinal vein occlusion, left eye, with macular edema: Secondary | ICD-10-CM | POA: Diagnosis not present

## 2021-03-31 DIAGNOSIS — H34812 Central retinal vein occlusion, left eye, with macular edema: Secondary | ICD-10-CM | POA: Diagnosis not present

## 2021-03-31 IMAGING — CR RIGHT KNEE - COMPLETE 4+ VIEW
4 series · 4 of 4 positions shown · non-contrast
Comparison: None.

CLINICAL DATA: Pain following fall

EXAM:
RIGHT KNEE - COMPLETE 4+ VIEW

[x knee ap right (1 of 4)]
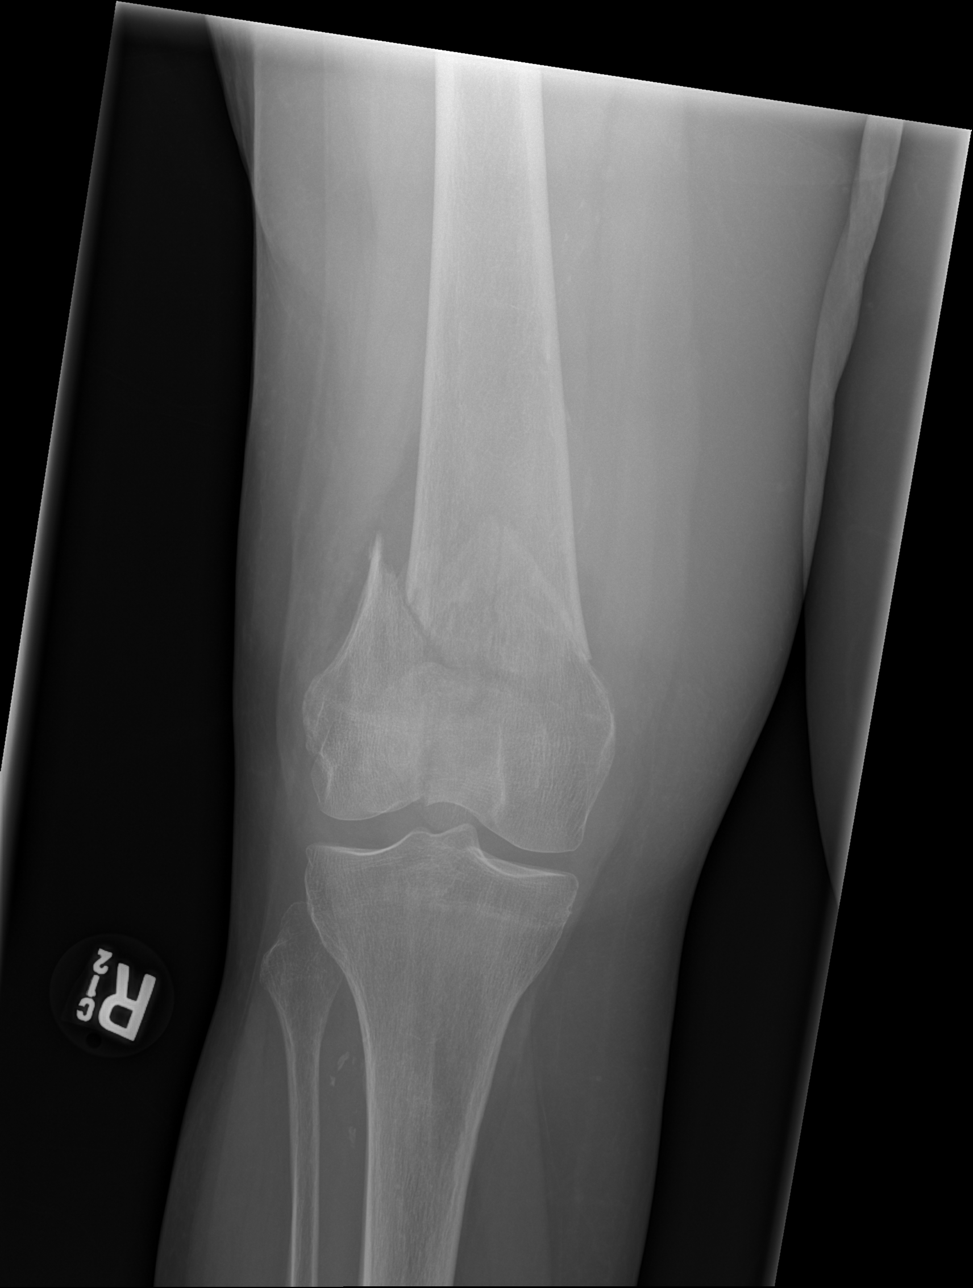

[x knee ap right (2 of 4)]
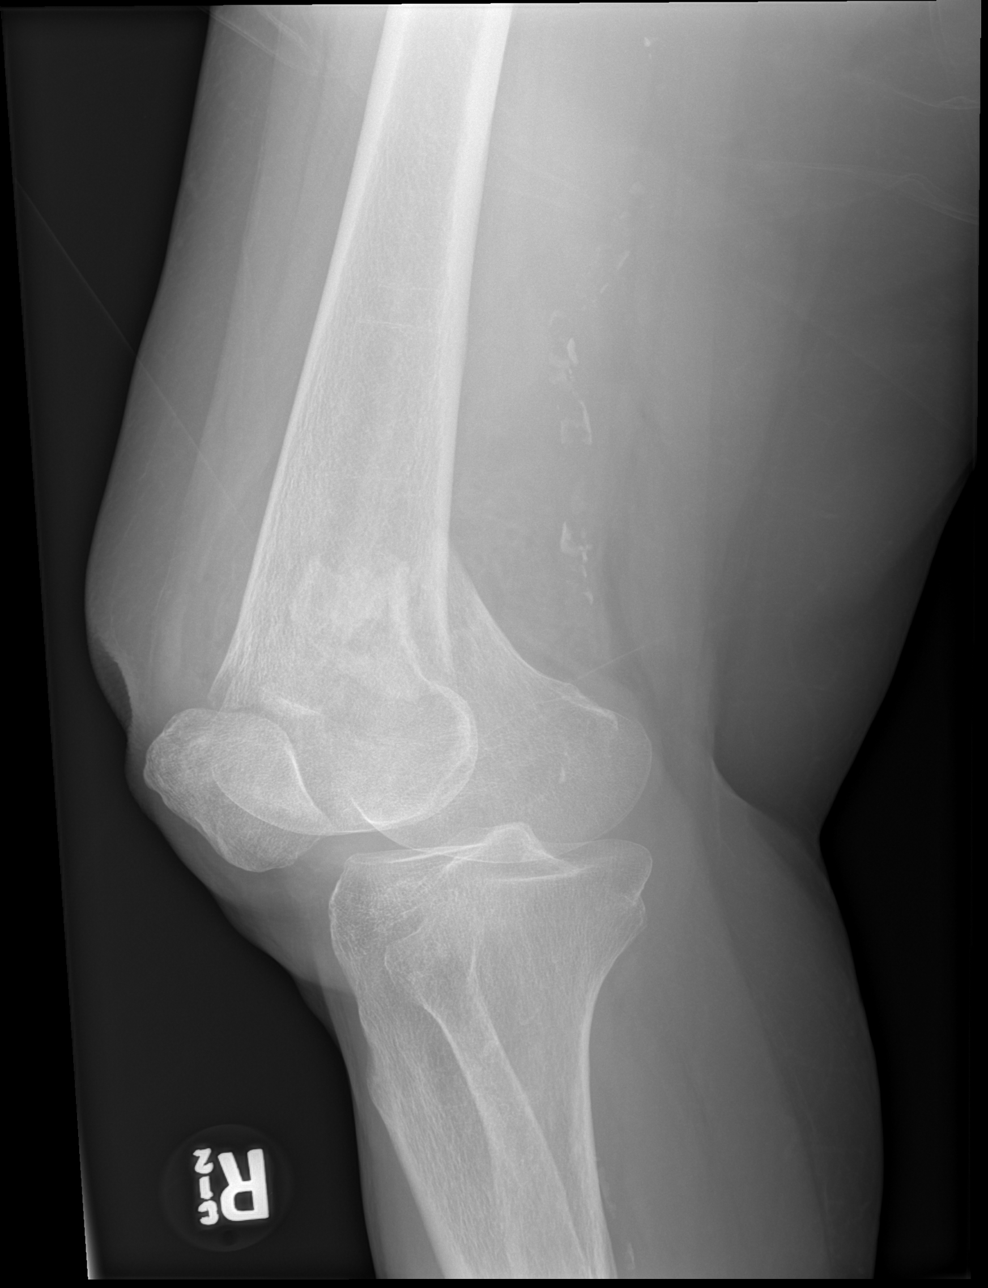

[x knee ap right (3 of 4)]
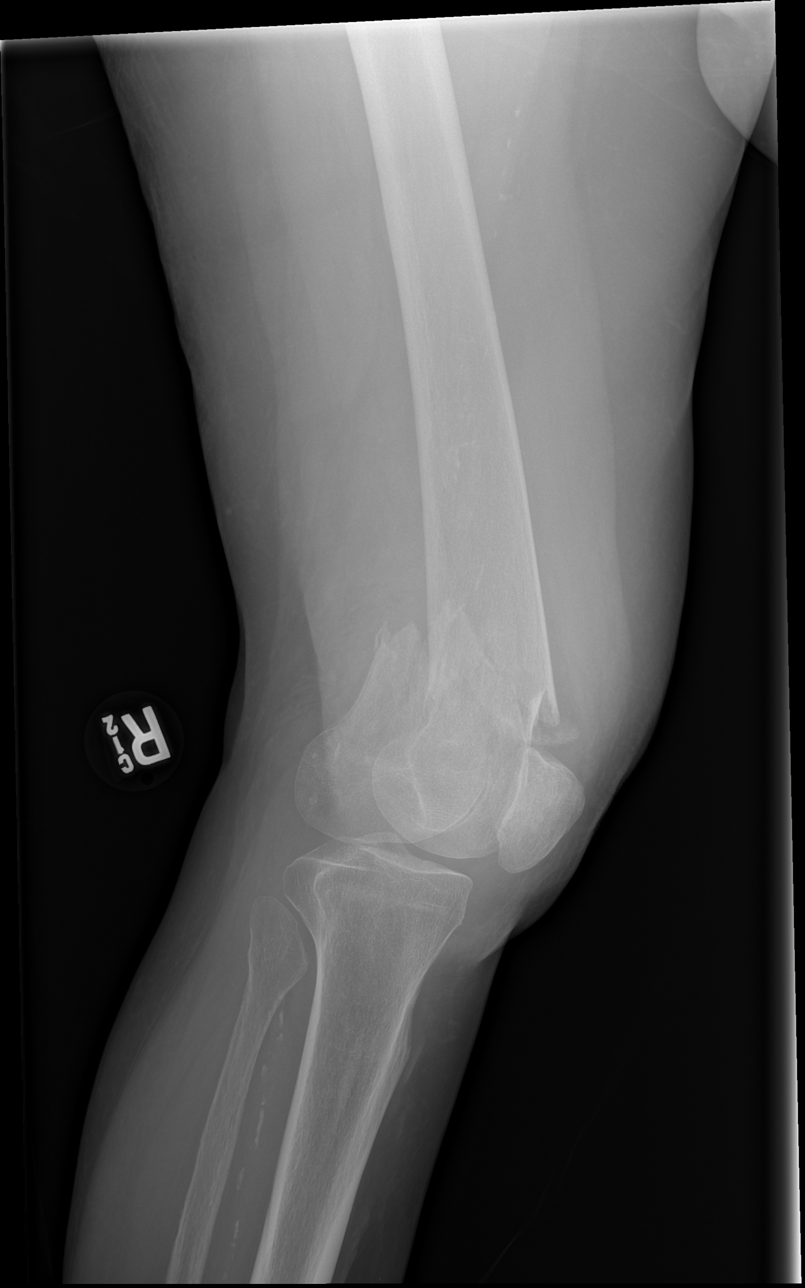

[x knee ap right (4 of 4)]
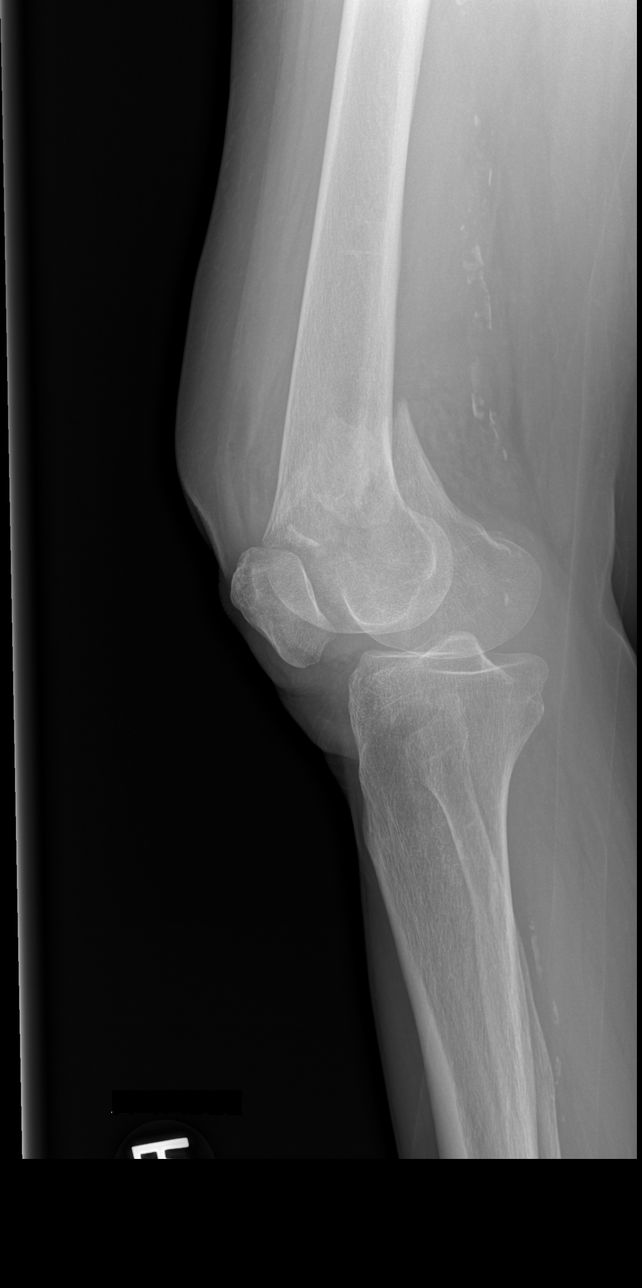

[4 of 4 positions shown; findings below may reference images not displayed]

FINDINGS: Frontal, lateral, and bilateral oblique views were obtained. There
is a comminuted fracture of the femur at the diaphysis-metaphysis
junction. A fracture fragment extends parallel to the cortex to
extend to the joint space just lateral to the midline of the distal
femur. There is posterior and lateral displacement of the distal
major fracture fragment with respect to the proximal major fracture
fragment. No gross dislocation. No appreciable joint effusion. No
appreciable joint space narrowing or erosion.
IMPRESSION: Comminuted fracture distal femur with a fracture fragment extending
to the level of the knee joint along the distal femoral condyle just
lateral to the midline. There is posterior and lateral displacement
of the major distal fracture fragment with respect to the major
proximal fragment. No gross dislocation. Other visualized bony
structures appear intact.

## 2021-03-31 IMAGING — RF RIGHT FEMUR 2 VIEWS
1 series · 5 of 5 positions shown · non-contrast
Comparison: Film from earlier in the same day

CLINICAL DATA: ORIF of right femoral fracture

EXAM:
RIGHT FEMUR 2 VIEWS

[Series 1: unknown protocol · 0.14mm/px · 5 of 5 slices shown]
[im 1/5]
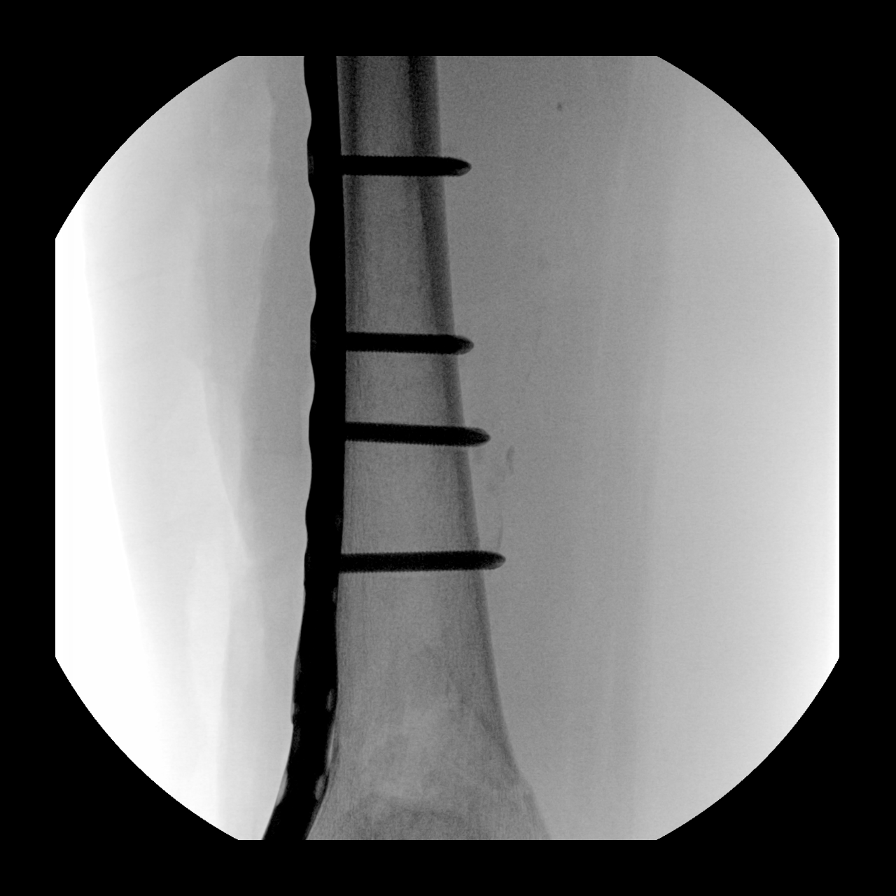
[im 2/5]
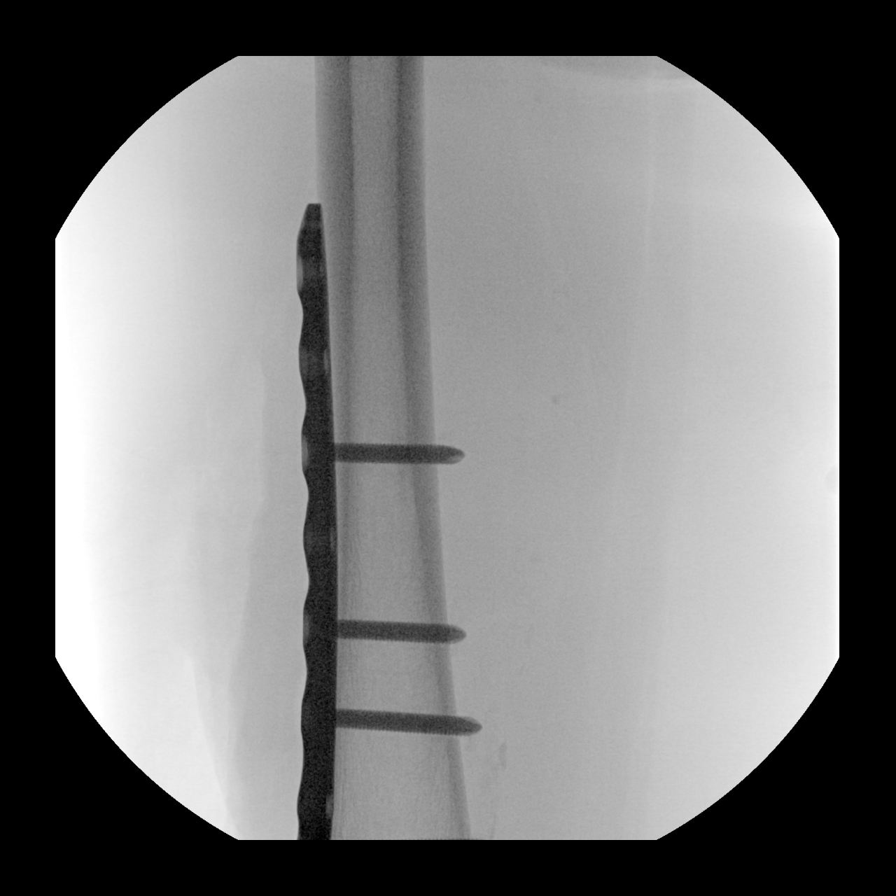
[im 3/5]
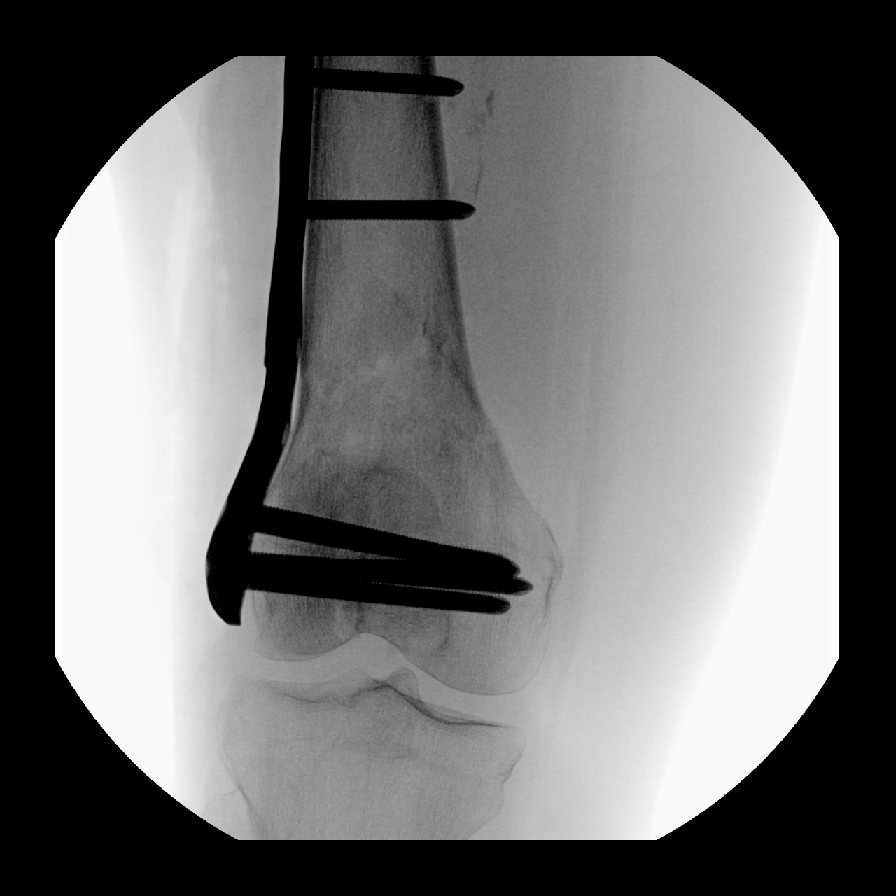
[im 4/5]
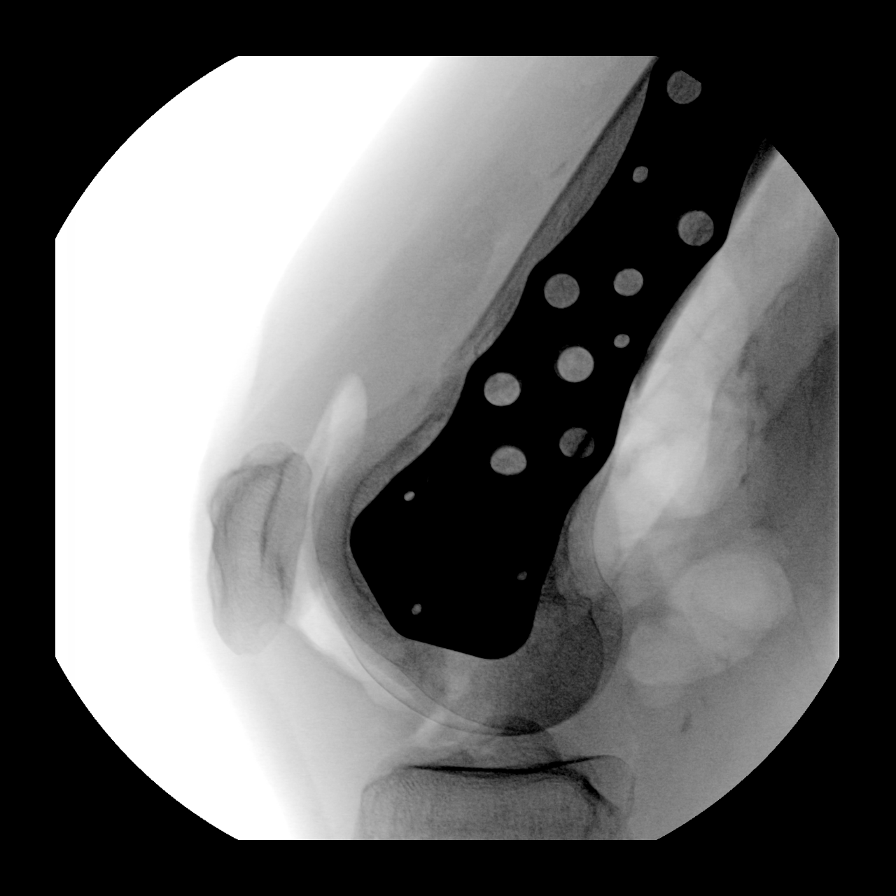
[im 5/5]
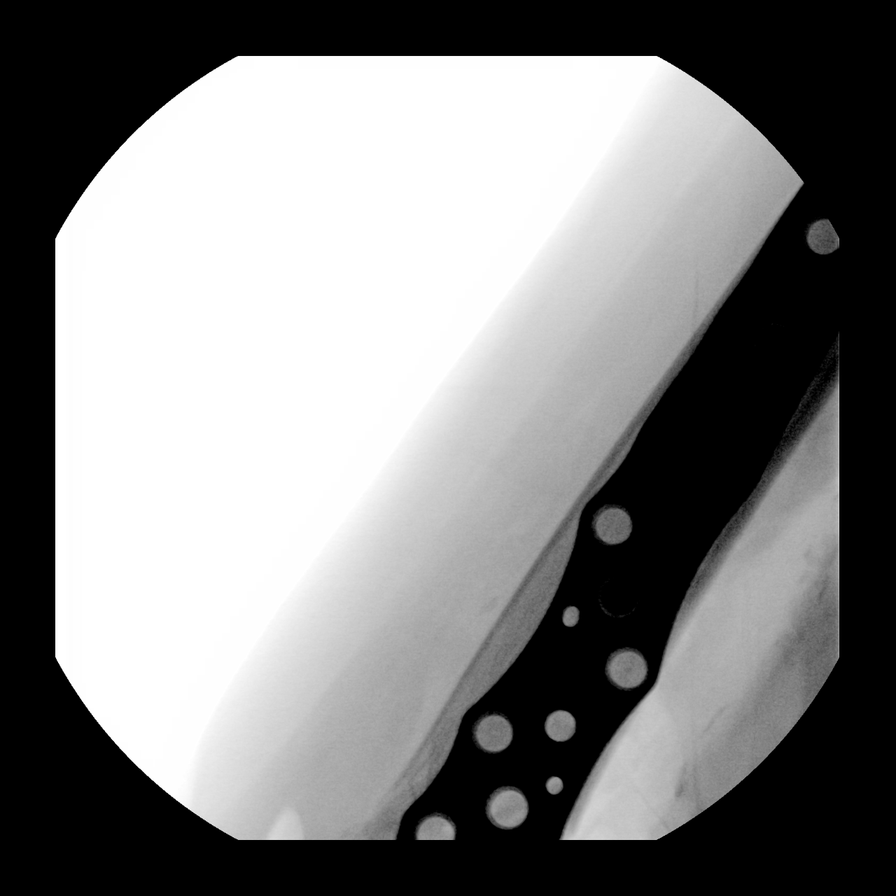

[5 of 5 positions shown; findings below may reference images not displayed]

FLUOROSCOPY TIME:  Radiation Exposure Index (as provided by the
fluoroscopic device): 3.25 mGy

If the device does not provide the exposure index:

Fluoroscopy Time:  42 seconds

Number of Acquired Images:  5
FINDINGS: Lateral fixation sideplate is noted with multiple fixation screws.
The fracture fragments have been reduced. No other focal abnormality
is noted.
IMPRESSION: Status post ORIF of distal femoral fracture

## 2021-03-31 IMAGING — DX PORTABLE CHEST - 1 VIEW
1 series · 1 of 1 positions shown · non-contrast
Comparison: None.

CLINICAL DATA: Preoperative evaluation for femur surgery

EXAM:
PORTABLE CHEST 1 VIEW

[chest ap]
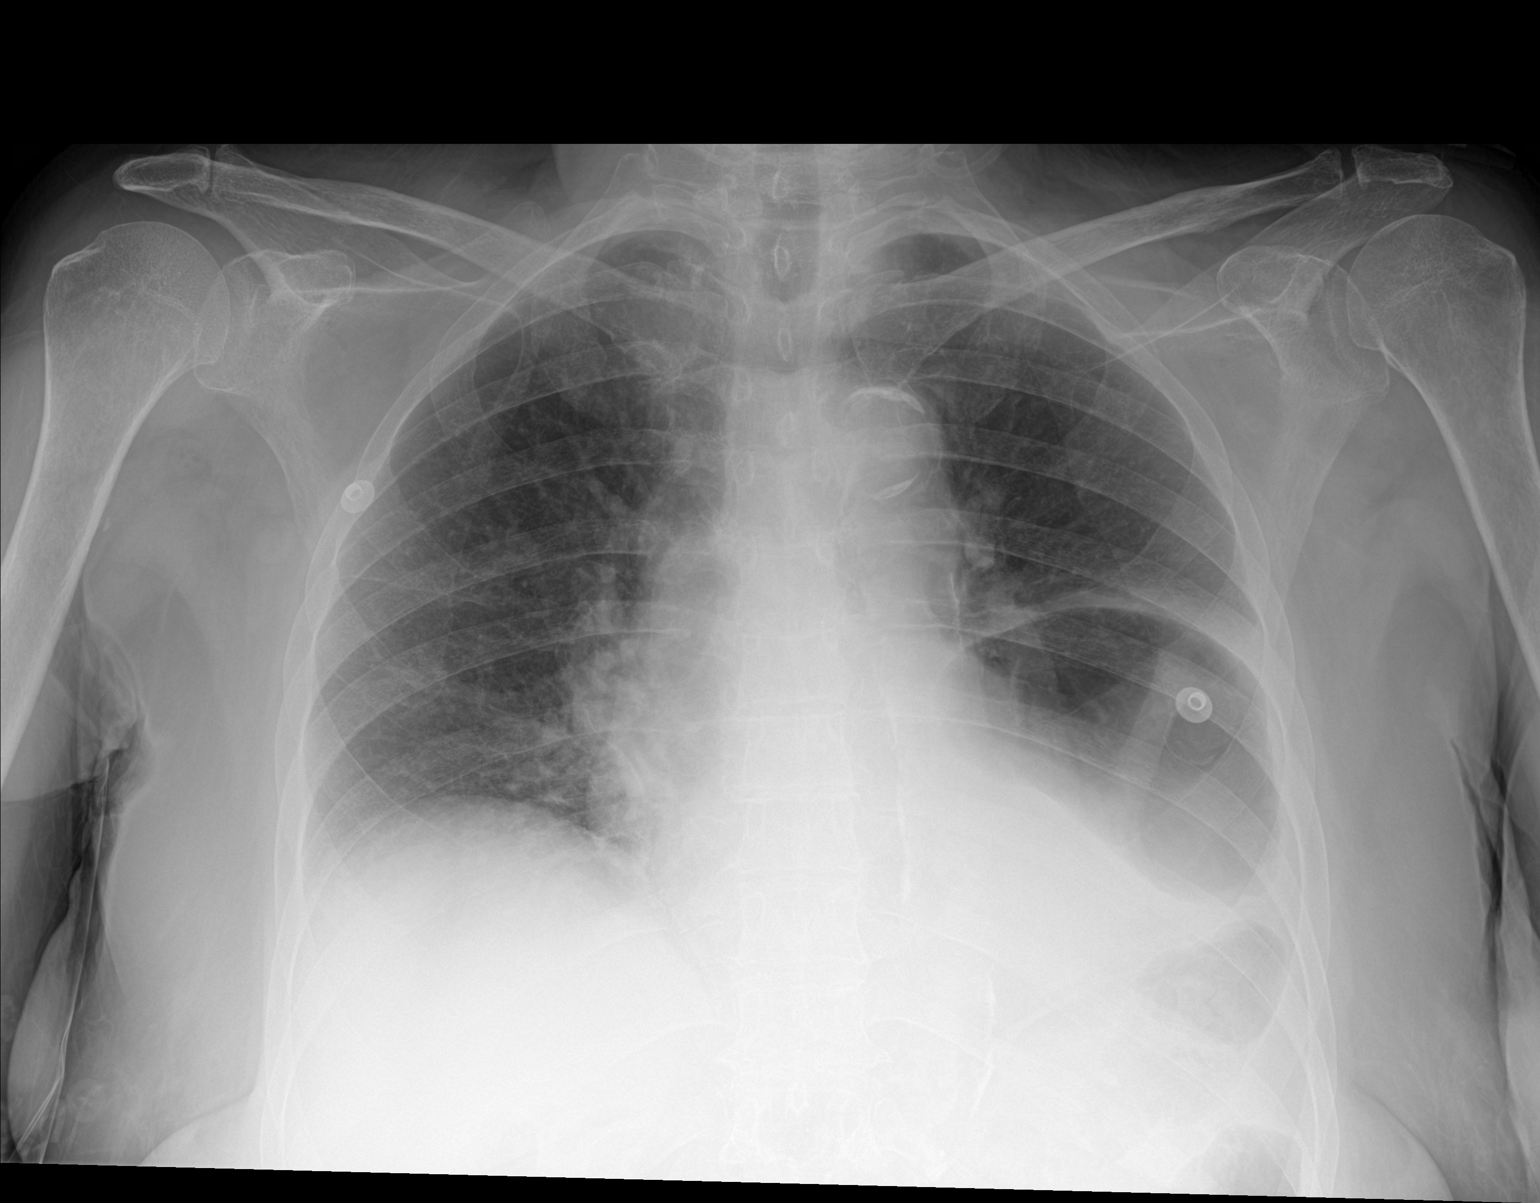

[1 of 1 positions shown; findings below may reference images not displayed]

FINDINGS: Elevation of left hemidiaphragm is noted. No focal infiltrate or
sizable effusion is seen. Cardiac shadow is at the upper limits of
normal in size. Aortic calcifications are noted.
IMPRESSION: No acute abnormality noted.

## 2021-04-28 DIAGNOSIS — H34812 Central retinal vein occlusion, left eye, with macular edema: Secondary | ICD-10-CM | POA: Diagnosis not present

## 2021-05-26 DIAGNOSIS — H34812 Central retinal vein occlusion, left eye, with macular edema: Secondary | ICD-10-CM | POA: Diagnosis not present

## 2021-06-23 DIAGNOSIS — H353132 Nonexudative age-related macular degeneration, bilateral, intermediate dry stage: Secondary | ICD-10-CM | POA: Diagnosis not present

## 2021-06-23 DIAGNOSIS — H34812 Central retinal vein occlusion, left eye, with macular edema: Secondary | ICD-10-CM | POA: Diagnosis not present

## 2022-07-20 ENCOUNTER — Telehealth: Payer: Self-pay | Admitting: Physician Assistant

## 2022-07-20 DIAGNOSIS — R030 Elevated blood-pressure reading, without diagnosis of hypertension: Secondary | ICD-10-CM | POA: Diagnosis not present

## 2022-07-20 DIAGNOSIS — N390 Urinary tract infection, site not specified: Secondary | ICD-10-CM | POA: Diagnosis not present

## 2022-07-20 NOTE — Telephone Encounter (Signed)
Patient states she need an appt to check for UTI and have her B/P checked. I advised she needed to follow up with her PCP, she states she knows Artis Delay and she doesn't have a PCP.Sarah Kitchenplease advise..[phone]

## 2023-01-06 ENCOUNTER — Encounter: Payer: Self-pay | Admitting: Podiatry

## 2023-01-06 ENCOUNTER — Ambulatory Visit: Payer: Medicare Other | Admitting: Podiatry

## 2023-01-06 DIAGNOSIS — B351 Tinea unguium: Secondary | ICD-10-CM | POA: Diagnosis not present

## 2023-01-06 DIAGNOSIS — M79675 Pain in left toe(s): Secondary | ICD-10-CM | POA: Diagnosis not present

## 2023-01-06 DIAGNOSIS — M79674 Pain in right toe(s): Secondary | ICD-10-CM | POA: Diagnosis not present

## 2023-01-06 NOTE — Progress Notes (Signed)

## 2023-02-02 ENCOUNTER — Ambulatory Visit: Payer: Medicare Other | Admitting: Physician Assistant

## 2023-02-02 ENCOUNTER — Encounter: Payer: Self-pay | Admitting: Physician Assistant

## 2023-02-02 DIAGNOSIS — R2689 Other abnormalities of gait and mobility: Secondary | ICD-10-CM

## 2023-02-02 DIAGNOSIS — I10 Essential (primary) hypertension: Secondary | ICD-10-CM

## 2023-02-02 DIAGNOSIS — I482 Chronic atrial fibrillation, unspecified: Secondary | ICD-10-CM

## 2023-02-02 NOTE — Progress Notes (Signed)
Office Visit Note   Patient: Sarah Wolf           Date of Birth: July 06, 1935           MRN: [id number] Visit Date: 02/02/2023              Requested by: Tally Joe, MD [phone] [address] [place],  [place] [address] PCP: Tally Joe, MD   Assessment & Plan: Visit Diagnoses:  1. Benign essential HTN   2. Chronic a-fib (HCC)   3. Balance disorder     Plan: Will make a referral for her to family medicine residency program so she can establish care with primary care physician.  She currently denies any medication outside of some Tylenol to sleep at night.  On exam today she has regular heartbeat and hypertension.  Follow-Up Instructions: No follow-ups on file.   Orders:  No orders of the defined types were placed in this encounter.  No orders of the defined types were placed in this encounter.     Procedures: No procedures performed   Clinical Data: No additional findings.   Subjective: Chief Complaint  Patient presents with   issues with balance and falling (falls backward)    HPI Sarah Wolf is a 88 year old female who sustained a closed displaced supracondylar fracture with intercondylar extension right hip in 02/2019.  She underwent an open reduction and internal fixation on the right and intra-articular distal femur fracture on 03/08/2019.  She comes in today due to the fact that she is having multiple falls and having to have her grandson pick her up off the floor.  She states since 2020 she has had 6 falls.  Her falls are actually not occurring daily or even weekly.  She has found that a cane is beneficial and helps her from falling.  She states she falls backwards.  Denies any dizziness, loss of consciousness, chest pain, chills or fevers.  She has had no particular injury with any of these falls.  During her hospital stay 2020 she was found to have A-fib hypertension.  She is someone who does not have a primary care doctor.  She has failed to follow-up with  anybody placed hospitalization.  Review of Systems See HPI.  Objective: Vital Signs: There were no vitals taken for this visit.  Physical Exam Constitutional:      Appearance: She is normal weight.  Cardiovascular:     Rate and Rhythm: Rhythm irregular.  Pulmonary:     Effort: Pulmonary effort is normal.  Neurological:     Mental Status: She is alert.  Psychiatric:        Mood and Affect: Mood normal.     Ortho Exam Ambulates with a single-point cane with slight antalgic gait.  She is able to get up and down from seated position.  Calves are supple nontender bilaterally.  Bilateral hip good range of motion without pain. Specialty Comments:  No specialty comments available.  Imaging: No results found.   PMFS History: Patient Active Problem List   Diagnosis Date Noted   Pain due to onychomycosis of toenails of both feet 01/06/2023   Hip fracture (HCC) 03/08/2019   Closed comminuted intra-articular fracture of distal femur, right, initial encounter (HCC)    Closed displaced supracondylar fracture with intracondylar extension of lower end of left femur (HCC)    No past medical history on file.  No family history on file.  Past Surgical History:  Procedure Laterality Date   ORIF  FEMUR FRACTURE Right 03/08/2019   Procedure: OPEN REDUCTION INTERNAL FIXATION (ORIF) DISTAL FEMUR FRACTURE;  Surgeon: Kathryne Hitch, MD;  Location: [facility];  Service: Orthopedics;  Laterality: Right;   Social History   Occupational History   Not on file  Tobacco Use   Smoking status: Never   Smokeless tobacco: Never  Substance and Sexual Activity   Alcohol use: Not on file   Drug use: Not on file   Sexual activity: Not on file

## 2023-03-02 DIAGNOSIS — M25511 Pain in right shoulder: Secondary | ICD-10-CM | POA: Diagnosis not present

## 2023-03-02 DIAGNOSIS — M75121 Complete rotator cuff tear or rupture of right shoulder, not specified as traumatic: Secondary | ICD-10-CM | POA: Diagnosis not present

## 2023-03-13 ENCOUNTER — Inpatient Hospital Stay (HOSPITAL_COMMUNITY)
Admission: EM | Admit: 2023-03-13 | Discharge: 2023-03-31 | DRG: 252 | Disposition: A | Discharge status: Skilled Nursing Facility | Payer: Medicare Other | Attending: Internal Medicine | Admitting: Internal Medicine

## 2023-03-13 ENCOUNTER — Emergency Department (HOSPITAL_COMMUNITY): Payer: Medicare Other

## 2023-03-13 ENCOUNTER — Emergency Department (HOSPITAL_COMMUNITY): Admit: 2023-03-13 | Discharge: 2023-03-13 | Disposition: A | Discharge status: Home / Self Care | Payer: Medicare Other

## 2023-03-13 ENCOUNTER — Other Ambulatory Visit: Payer: Self-pay

## 2023-03-13 DIAGNOSIS — I513 Intracardiac thrombosis, not elsewhere classified: Secondary | ICD-10-CM | POA: Diagnosis present

## 2023-03-13 DIAGNOSIS — K264 Chronic or unspecified duodenal ulcer with hemorrhage: Secondary | ICD-10-CM | POA: Diagnosis not present

## 2023-03-13 DIAGNOSIS — E1122 Type 2 diabetes mellitus with diabetic chronic kidney disease: Secondary | ICD-10-CM | POA: Diagnosis present

## 2023-03-13 DIAGNOSIS — R000 Tachycardia, unspecified: Secondary | ICD-10-CM | POA: Diagnosis not present

## 2023-03-13 DIAGNOSIS — I70221 Atherosclerosis of native arteries of extremities with rest pain, right leg: Secondary | ICD-10-CM | POA: Diagnosis not present

## 2023-03-13 DIAGNOSIS — I5023 Acute on chronic systolic (congestive) heart failure: Secondary | ICD-10-CM | POA: Diagnosis present

## 2023-03-13 DIAGNOSIS — R5381 Other malaise: Secondary | ICD-10-CM | POA: Diagnosis not present

## 2023-03-13 DIAGNOSIS — R6520 Severe sepsis without septic shock: Secondary | ICD-10-CM | POA: Diagnosis not present

## 2023-03-13 DIAGNOSIS — Y95 Nosocomial condition: Secondary | ICD-10-CM | POA: Diagnosis not present

## 2023-03-13 DIAGNOSIS — R0602 Shortness of breath: Secondary | ICD-10-CM | POA: Diagnosis not present

## 2023-03-13 DIAGNOSIS — J9811 Atelectasis: Secondary | ICD-10-CM | POA: Diagnosis not present

## 2023-03-13 DIAGNOSIS — I70202 Unspecified atherosclerosis of native arteries of extremities, left leg: Secondary | ICD-10-CM | POA: Diagnosis present

## 2023-03-13 DIAGNOSIS — J690 Pneumonitis due to inhalation of food and vomit: Secondary | ICD-10-CM | POA: Diagnosis not present

## 2023-03-13 DIAGNOSIS — I952 Hypotension due to drugs: Secondary | ICD-10-CM | POA: Diagnosis not present

## 2023-03-13 DIAGNOSIS — Z79899 Other long term (current) drug therapy: Secondary | ICD-10-CM

## 2023-03-13 DIAGNOSIS — N1831 Chronic kidney disease, stage 3a: Secondary | ICD-10-CM | POA: Diagnosis present

## 2023-03-13 DIAGNOSIS — I82521 Chronic embolism and thrombosis of right iliac vein: Secondary | ICD-10-CM | POA: Diagnosis present

## 2023-03-13 DIAGNOSIS — K2211 Ulcer of esophagus with bleeding: Secondary | ICD-10-CM | POA: Diagnosis not present

## 2023-03-13 DIAGNOSIS — E878 Other disorders of electrolyte and fluid balance, not elsewhere classified: Secondary | ICD-10-CM | POA: Diagnosis present

## 2023-03-13 DIAGNOSIS — I5A Non-ischemic myocardial injury (non-traumatic): Secondary | ICD-10-CM | POA: Diagnosis not present

## 2023-03-13 DIAGNOSIS — I712 Thoracic aortic aneurysm, without rupture, unspecified: Secondary | ICD-10-CM | POA: Diagnosis not present

## 2023-03-13 DIAGNOSIS — R0902 Hypoxemia: Secondary | ICD-10-CM | POA: Diagnosis not present

## 2023-03-13 DIAGNOSIS — I7090 Unspecified atherosclerosis: Secondary | ICD-10-CM | POA: Diagnosis not present

## 2023-03-13 DIAGNOSIS — J90 Pleural effusion, not elsewhere classified: Secondary | ICD-10-CM | POA: Diagnosis not present

## 2023-03-13 DIAGNOSIS — K2270 Barrett's esophagus without dysplasia: Secondary | ICD-10-CM | POA: Diagnosis not present

## 2023-03-13 DIAGNOSIS — I82491 Acute embolism and thrombosis of other specified deep vein of right lower extremity: Secondary | ICD-10-CM | POA: Diagnosis not present

## 2023-03-13 DIAGNOSIS — K2970 Gastritis, unspecified, without bleeding: Secondary | ICD-10-CM | POA: Diagnosis not present

## 2023-03-13 DIAGNOSIS — E872 Acidosis, unspecified: Secondary | ICD-10-CM | POA: Diagnosis present

## 2023-03-13 DIAGNOSIS — R569 Unspecified convulsions: Secondary | ICD-10-CM | POA: Diagnosis not present

## 2023-03-13 DIAGNOSIS — I739 Peripheral vascular disease, unspecified: Secondary | ICD-10-CM | POA: Diagnosis not present

## 2023-03-13 DIAGNOSIS — I4819 Other persistent atrial fibrillation: Secondary | ICD-10-CM | POA: Diagnosis present

## 2023-03-13 DIAGNOSIS — Z7189 Other specified counseling: Secondary | ICD-10-CM

## 2023-03-13 DIAGNOSIS — Z452 Encounter for adjustment and management of vascular access device: Secondary | ICD-10-CM | POA: Diagnosis not present

## 2023-03-13 DIAGNOSIS — I700 Atherosclerosis of aorta: Secondary | ICD-10-CM | POA: Diagnosis present

## 2023-03-13 DIAGNOSIS — K2990 Gastroduodenitis, unspecified, without bleeding: Secondary | ICD-10-CM | POA: Diagnosis not present

## 2023-03-13 DIAGNOSIS — R4182 Altered mental status, unspecified: Secondary | ICD-10-CM | POA: Diagnosis not present

## 2023-03-13 DIAGNOSIS — I130 Hypertensive heart and chronic kidney disease with heart failure and stage 1 through stage 4 chronic kidney disease, or unspecified chronic kidney disease: Secondary | ICD-10-CM | POA: Diagnosis not present

## 2023-03-13 DIAGNOSIS — Z86718 Personal history of other venous thrombosis and embolism: Secondary | ICD-10-CM

## 2023-03-13 DIAGNOSIS — I70223 Atherosclerosis of native arteries of extremities with rest pain, bilateral legs: Secondary | ICD-10-CM | POA: Diagnosis not present

## 2023-03-13 DIAGNOSIS — R7989 Other specified abnormal findings of blood chemistry: Secondary | ICD-10-CM | POA: Diagnosis present

## 2023-03-13 DIAGNOSIS — K449 Diaphragmatic hernia without obstruction or gangrene: Secondary | ICD-10-CM | POA: Diagnosis not present

## 2023-03-13 DIAGNOSIS — K269 Duodenal ulcer, unspecified as acute or chronic, without hemorrhage or perforation: Secondary | ICD-10-CM | POA: Diagnosis not present

## 2023-03-13 DIAGNOSIS — I7092 Chronic total occlusion of artery of the extremities: Secondary | ICD-10-CM | POA: Diagnosis present

## 2023-03-13 DIAGNOSIS — R9389 Abnormal findings on diagnostic imaging of other specified body structures: Secondary | ICD-10-CM | POA: Diagnosis not present

## 2023-03-13 DIAGNOSIS — I480 Paroxysmal atrial fibrillation: Secondary | ICD-10-CM | POA: Diagnosis present

## 2023-03-13 DIAGNOSIS — Z4682 Encounter for fitting and adjustment of non-vascular catheter: Secondary | ICD-10-CM | POA: Diagnosis not present

## 2023-03-13 DIAGNOSIS — Z23 Encounter for immunization: Secondary | ICD-10-CM | POA: Diagnosis not present

## 2023-03-13 DIAGNOSIS — I701 Atherosclerosis of renal artery: Secondary | ICD-10-CM | POA: Diagnosis not present

## 2023-03-13 DIAGNOSIS — T4275XA Adverse effect of unspecified antiepileptic and sedative-hypnotic drugs, initial encounter: Secondary | ICD-10-CM | POA: Diagnosis not present

## 2023-03-13 DIAGNOSIS — E1151 Type 2 diabetes mellitus with diabetic peripheral angiopathy without gangrene: Principal | ICD-10-CM | POA: Diagnosis present

## 2023-03-13 DIAGNOSIS — D696 Thrombocytopenia, unspecified: Secondary | ICD-10-CM | POA: Diagnosis not present

## 2023-03-13 DIAGNOSIS — R0603 Acute respiratory distress: Secondary | ICD-10-CM | POA: Diagnosis not present

## 2023-03-13 DIAGNOSIS — J9859 Other diseases of mediastinum, not elsewhere classified: Secondary | ICD-10-CM | POA: Diagnosis not present

## 2023-03-13 DIAGNOSIS — T17908A Unspecified foreign body in respiratory tract, part unspecified causing other injury, initial encounter: Secondary | ICD-10-CM

## 2023-03-13 DIAGNOSIS — I509 Heart failure, unspecified: Secondary | ICD-10-CM | POA: Diagnosis not present

## 2023-03-13 DIAGNOSIS — Z48812 Encounter for surgical aftercare following surgery on the circulatory system: Secondary | ICD-10-CM | POA: Diagnosis not present

## 2023-03-13 DIAGNOSIS — E871 Hypo-osmolality and hyponatremia: Secondary | ICD-10-CM | POA: Diagnosis not present

## 2023-03-13 DIAGNOSIS — E876 Hypokalemia: Secondary | ICD-10-CM | POA: Diagnosis present

## 2023-03-13 DIAGNOSIS — R1110 Vomiting, unspecified: Secondary | ICD-10-CM | POA: Diagnosis not present

## 2023-03-13 DIAGNOSIS — J9601 Acute respiratory failure with hypoxia: Secondary | ICD-10-CM

## 2023-03-13 DIAGNOSIS — M79661 Pain in right lower leg: Secondary | ICD-10-CM | POA: Diagnosis not present

## 2023-03-13 DIAGNOSIS — Z8719 Personal history of other diseases of the digestive system: Secondary | ICD-10-CM

## 2023-03-13 DIAGNOSIS — Z1152 Encounter for screening for COVID-19: Secondary | ICD-10-CM | POA: Diagnosis not present

## 2023-03-13 DIAGNOSIS — R296 Repeated falls: Secondary | ICD-10-CM | POA: Diagnosis present

## 2023-03-13 DIAGNOSIS — K922 Gastrointestinal hemorrhage, unspecified: Secondary | ICD-10-CM | POA: Diagnosis not present

## 2023-03-13 DIAGNOSIS — Z7901 Long term (current) use of anticoagulants: Secondary | ICD-10-CM

## 2023-03-13 DIAGNOSIS — T17908D Unspecified foreign body in respiratory tract, part unspecified causing other injury, subsequent encounter: Secondary | ICD-10-CM | POA: Diagnosis not present

## 2023-03-13 DIAGNOSIS — I4891 Unspecified atrial fibrillation: Secondary | ICD-10-CM | POA: Diagnosis not present

## 2023-03-13 DIAGNOSIS — I498 Other specified cardiac arrhythmias: Secondary | ICD-10-CM | POA: Diagnosis not present

## 2023-03-13 DIAGNOSIS — Z66 Do not resuscitate: Secondary | ICD-10-CM | POA: Diagnosis not present

## 2023-03-13 DIAGNOSIS — I70209 Unspecified atherosclerosis of native arteries of extremities, unspecified extremity: Principal | ICD-10-CM

## 2023-03-13 DIAGNOSIS — D62 Acute posthemorrhagic anemia: Secondary | ICD-10-CM | POA: Diagnosis not present

## 2023-03-13 DIAGNOSIS — N2889 Other specified disorders of kidney and ureter: Secondary | ICD-10-CM | POA: Diagnosis not present

## 2023-03-13 DIAGNOSIS — R918 Other nonspecific abnormal finding of lung field: Secondary | ICD-10-CM | POA: Diagnosis not present

## 2023-03-13 DIAGNOSIS — K209 Esophagitis, unspecified without bleeding: Secondary | ICD-10-CM

## 2023-03-13 DIAGNOSIS — Z7401 Bed confinement status: Secondary | ICD-10-CM | POA: Diagnosis not present

## 2023-03-13 DIAGNOSIS — I499 Cardiac arrhythmia, unspecified: Secondary | ICD-10-CM | POA: Diagnosis not present

## 2023-03-13 DIAGNOSIS — I82412 Acute embolism and thrombosis of left femoral vein: Secondary | ICD-10-CM | POA: Diagnosis not present

## 2023-03-13 DIAGNOSIS — I5020 Unspecified systolic (congestive) heart failure: Secondary | ICD-10-CM

## 2023-03-13 DIAGNOSIS — R079 Chest pain, unspecified: Secondary | ICD-10-CM | POA: Diagnosis not present

## 2023-03-13 DIAGNOSIS — I5032 Chronic diastolic (congestive) heart failure: Secondary | ICD-10-CM | POA: Diagnosis not present

## 2023-03-13 DIAGNOSIS — M79605 Pain in left leg: Secondary | ICD-10-CM

## 2023-03-13 DIAGNOSIS — A419 Sepsis, unspecified organism: Secondary | ICD-10-CM | POA: Diagnosis not present

## 2023-03-13 DIAGNOSIS — J9600 Acute respiratory failure, unspecified whether with hypoxia or hypercapnia: Secondary | ICD-10-CM | POA: Diagnosis not present

## 2023-03-13 DIAGNOSIS — G9341 Metabolic encephalopathy: Secondary | ICD-10-CM | POA: Diagnosis not present

## 2023-03-13 DIAGNOSIS — R339 Retention of urine, unspecified: Secondary | ICD-10-CM | POA: Diagnosis not present

## 2023-03-13 DIAGNOSIS — I517 Cardiomegaly: Secondary | ICD-10-CM | POA: Diagnosis not present

## 2023-03-13 DIAGNOSIS — M79604 Pain in right leg: Secondary | ICD-10-CM

## 2023-03-13 DIAGNOSIS — K920 Hematemesis: Secondary | ICD-10-CM | POA: Diagnosis not present

## 2023-03-13 LAB — COMPREHENSIVE METABOLIC PANEL
ALT: 19 U/L (ref 0–44)
AST: 28 U/L (ref 15–41)
Albumin: 3.6 g/dL (ref 3.5–5.0)
Alkaline Phosphatase: 60 U/L (ref 38–126)
Anion gap: 13 (ref 5–15)
BUN: 14 mg/dL (ref 8–23)
CO2: 27 mmol/L (ref 22–32)
Calcium: 9.1 mg/dL (ref 8.9–10.3)
Chloride: 93 mmol/L — ABNORMAL LOW (ref 98–111)
Creatinine, Ser: 0.87 mg/dL (ref 0.44–1.00)
GFR, Estimated: >60 mL/min (ref >60)
Glucose, Bld: 140 mg/dL — ABNORMAL HIGH (ref 70–99)
Potassium: 3.5 mmol/L (ref 3.5–5.1)
Sodium: 133 mmol/L — ABNORMAL LOW (ref 135–145)
Total Bilirubin: 1.3 mg/dL — ABNORMAL HIGH (ref 0.3–1.2)
Total Protein: 7 g/dL (ref 6.5–8.1)

## 2023-03-13 LAB — CBC WITH DIFFERENTIAL/PLATELET
Abs Immature Granulocytes: 0.02 10*3/uL (ref 0.00–0.07)
Basophils Absolute: 0.1 10*3/uL (ref 0.0–0.1)
Basophils Relative: 1 %
Eosinophils Absolute: 0.2 10*3/uL (ref 0.0–0.5)
Eosinophils Relative: 2 %
HCT: 45.1 % (ref 36.0–46.0)
Hemoglobin: 14.6 g/dL (ref 12.0–15.0)
Immature Granulocytes: 0 %
Lymphocytes Relative: 17 %
Lymphs Abs: 1.2 10*3/uL (ref 0.7–4.0)
MCH: 31.9 pg (ref 26.0–34.0)
MCHC: 32.4 g/dL (ref 30.0–36.0)
MCV: 98.5 fL (ref 80.0–100.0)
Monocytes Absolute: 0.5 10*3/uL (ref 0.1–1.0)
Monocytes Relative: 7 %
Neutro Abs: 5.1 10*3/uL (ref 1.7–7.7)
Neutrophils Relative %: 73 %
Platelets: 181 10*3/uL (ref 150–400)
RBC: 4.58 MIL/uL (ref 3.87–5.11)
RDW: 14.1 % (ref 11.5–15.5)
WBC: 7 10*3/uL (ref 4.0–10.5)
nRBC: 0 % (ref 0.0–0.2)

## 2023-03-13 LAB — PROTIME-INR
INR: 1.2 (ref 0.8–1.2)
Prothrombin Time: 15.1 seconds (ref 11.4–15.2)

## 2023-03-13 LAB — LIPID PANEL
Cholesterol: 153 mg/dL (ref 0–200)
HDL: 47 mg/dL (ref >40)
LDL Cholesterol: 98 mg/dL (ref 0–99)
Total CHOL/HDL Ratio: 3.3 RATIO
Triglycerides: 40 mg/dL (ref <150)
VLDL: 8 mg/dL (ref 0–40)

## 2023-03-13 LAB — GLUCOSE, CAPILLARY
Glucose-Capillary: 181 mg/dL — ABNORMAL HIGH (ref 70–99)
Glucose-Capillary: 166 mg/dL — ABNORMAL HIGH (ref 70–99)

## 2023-03-13 LAB — TSH: TSH: 2.94 u[IU]/mL (ref 0.350–4.500)

## 2023-03-13 LAB — APTT: aPTT: 37 seconds — ABNORMAL HIGH (ref 24–36)

## 2023-03-13 LAB — TROPONIN I (HIGH SENSITIVITY)
Troponin I (High Sensitivity): 55 ng/L — ABNORMAL HIGH (ref <18)
Troponin I (High Sensitivity): 57 ng/L — ABNORMAL HIGH (ref <18)

## 2023-03-13 LAB — HEPARIN LEVEL (UNFRACTIONATED): Heparin Unfractionated: 0.93 [IU]/mL — ABNORMAL HIGH (ref 0.30–0.70)

## 2023-03-13 LAB — BRAIN NATRIURETIC PEPTIDE: B Natriuretic Peptide: 799.9 pg/mL — ABNORMAL HIGH (ref 0.0–100.0)

## 2023-03-13 LAB — MAGNESIUM: Magnesium: 2.2 mg/dL (ref 1.7–2.4)

## 2023-03-13 MED ORDER — ACETAMINOPHEN 650 MG RE SUPP: 650.0000 mg | Freq: Four times a day (QID) | RECTAL | Status: DC | PRN

## 2023-03-13 MED ORDER — SODIUM CHLORIDE 0.9% FLUSH
3.0000 mL | Freq: Two times a day (BID) | INTRAVENOUS | Status: DC
  Administered 2023-03-13 – 2023-03-18 (×6): 3 mL via INTRAVENOUS

## 2023-03-13 MED ORDER — POTASSIUM CHLORIDE CRYS ER 20 MEQ PO TBCR
40.0000 meq | EXTENDED_RELEASE_TABLET | ORAL | Status: AC
  Administered 2023-03-13: 40 meq via ORAL
  Filled 2023-03-13: qty 2

## 2023-03-13 MED ORDER — ONDANSETRON HCL 4 MG/2ML IJ SOLN
4.0000 mg | Freq: Once | INTRAMUSCULAR | Status: AC
  Administered 2023-03-13: 4 mg via INTRAVENOUS
  Filled 2023-03-13: qty 2

## 2023-03-13 MED ORDER — FUROSEMIDE 10 MG/ML IJ SOLN
40.0000 mg | Freq: Once | INTRAMUSCULAR | Status: AC
  Administered 2023-03-13: 40 mg via INTRAVENOUS
  Filled 2023-03-13: qty 4

## 2023-03-13 MED ORDER — METOPROLOL TARTRATE 5 MG/5ML IV SOLN
5.0000 mg | INTRAVENOUS | Status: DC | PRN
  Administered 2023-03-13: 5 mg via INTRAVENOUS

## 2023-03-13 MED ORDER — ACETAMINOPHEN 325 MG PO TABS
650.0000 mg | ORAL_TABLET | Freq: Four times a day (QID) | ORAL | Status: DC | PRN
  Administered 2023-03-13 – 2023-03-23 (×4): 650 mg via ORAL
  Filled 2023-03-13 (×5): qty 2

## 2023-03-13 MED ORDER — MORPHINE SULFATE (PF) 2 MG/ML IV SOLN
2.0000 mg | Freq: Once | INTRAVENOUS | Status: AC
  Administered 2023-03-13: 2 mg via INTRAVENOUS
  Filled 2023-03-13: qty 1

## 2023-03-13 MED ORDER — FUROSEMIDE 10 MG/ML IJ SOLN: 40.0000 mg | Freq: Two times a day (BID) | INTRAMUSCULAR | Status: DC

## 2023-03-13 MED ORDER — METOPROLOL TARTRATE 5 MG/5ML IV SOLN
5.0000 mg | INTRAVENOUS | Status: DC | PRN
  Administered 2023-03-13 (×2): 5 mg via INTRAVENOUS
  Filled 2023-03-13 (×4): qty 5

## 2023-03-13 MED ORDER — HEPARIN BOLUS VIA INFUSION
4000.0000 [IU] | Freq: Once | INTRAVENOUS | Status: AC
  Administered 2023-03-13: 4000 [IU] via INTRAVENOUS
  Filled 2023-03-13: qty 4000

## 2023-03-13 MED ORDER — IOHEXOL 350 MG/ML SOLN
100.0000 mL | Freq: Once | INTRAVENOUS | Status: AC | PRN
  Administered 2023-03-13: 100 mL via INTRAVENOUS

## 2023-03-13 MED ORDER — AMIODARONE LOAD VIA INFUSION
150.0000 mg | Freq: Once | INTRAVENOUS | Status: AC
  Administered 2023-03-13: 150 mg via INTRAVENOUS
  Filled 2023-03-13: qty 83.34

## 2023-03-13 MED ORDER — HYDROCODONE-ACETAMINOPHEN 5-325 MG PO TABS
1.0000 | ORAL_TABLET | Freq: Four times a day (QID) | ORAL | Status: DC | PRN
  Administered 2023-03-13 – 2023-03-26 (×6): 1 via ORAL
  Filled 2023-03-13 (×6): qty 1

## 2023-03-13 MED ORDER — ALBUTEROL SULFATE (2.5 MG/3ML) 0.083% IN NEBU
2.5000 mg | INHALATION_SOLUTION | Freq: Four times a day (QID) | RESPIRATORY_TRACT | Status: DC | PRN
  Administered 2023-03-18 – 2023-03-24 (×3): 2.5 mg via RESPIRATORY_TRACT
  Filled 2023-03-13 (×3): qty 3

## 2023-03-13 MED ORDER — AMIODARONE HCL IN DEXTROSE 360-4.14 MG/200ML-% IV SOLN
60.0000 mg/h | INTRAVENOUS | Status: DC
  Administered 2023-03-13: 60 mg/h via INTRAVENOUS
  Filled 2023-03-13: qty 200

## 2023-03-13 MED ORDER — HEPARIN (PORCINE) 25000 UT/250ML-% IV SOLN
900.0000 [IU]/h | INTRAVENOUS | Status: DC
  Administered 2023-03-13: 1050 [IU]/h via INTRAVENOUS
  Administered 2023-03-14: 900 [IU]/h via INTRAVENOUS
  Filled 2023-03-13 (×2): qty 250

## 2023-03-13 MED ORDER — AMIODARONE HCL IN DEXTROSE 360-4.14 MG/200ML-% IV SOLN: 30.0000 mg/h | INTRAVENOUS | Status: DC

## 2023-03-13 MED ORDER — DILTIAZEM HCL-DEXTROSE 125-5 MG/125ML-% IV SOLN (PREMIX)
5.0000 mg/h | INTRAVENOUS | Status: DC
  Administered 2023-03-13 – 2023-03-14 (×2): 5 mg/h via INTRAVENOUS
  Administered 2023-03-15: 7.5 mg/h via INTRAVENOUS
  Filled 2023-03-13 (×3): qty 125

## 2023-03-13 NOTE — H&P (Addendum)
History and Physical    PatientAdrielle Wolf ZOX:[id number] DOB: 1934-12-05 DOA: 03/13/2023 DOS: the patient was seen and examined on 03/13/2023 PCP: Tally Joe, MD  Patient coming from: Home  Chief Complaint:  Chief Complaint  Patient presents with   Leg Pain    Pt coming from home via EMS with bilateral lower leg pain, with pain being worse in the right leg. Currently pain is a 6/10. Pt found to be in afib-RVR. She denies chest pain or shob.    HPI: Sarah Wolf is a 88 y.o. female with medical history significant of hypertension, paroxysmal atrial fibrillation, and history of DVT no longer on anticoagulation who presents with complaints of right leg pain starting this morning.  Pain was all way down her right leg and was severe.  Thereafter reported having pain temporarily in both legs.  Denies having any significant chest pain, palpitations, shortness of breath, cough, nausea, vomiting, diarrhea, or leg swelling. .  Review of records make note that patient had been noted to have a left lower extremity DVT back in 2020 as well as an echocardiogram which noted atrial fibrillation at that time.  She had been on Xarelto temporarily after being found to have a left lower extremity DVT following a fall suffering a femur fracture back on 03/08/2019. At baseline patient lives alone, is ambulatory with use of a cane, and is not on oxygen.  She is not currently being followed by her primary care provider.  In route with EMS patient was noted to be in A-fib with RVR.  In the emergency department patient was noted to be afebrile with pulse elevated up to 145 and atrial fibrillation, systolic blood pressures noted to be elevated into the 160s., and O2 saturations currently maintained on 1.5 L nasal cannula oxygen.  Labs noted sodium 133, potassium 3.5, chloride 93, TSH 2.94 , BNP 799.9, and high-sensitivity troponins 55->57.  Doppler ultrasounds of the lower extremity did not note any signs of a DVT, but did  note occlusion of the proximal to mid SFA.  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  As a surgery have been formally consulted.  Patient had been started on heparin drip and given metoprolol IV intermittently.  Review of Systems: As mentioned in the history of present illness. All other systems reviewed and are negative. No past medical history on file. Past Surgical History:  Procedure Laterality Date   ORIF FEMUR FRACTURE Right 03/08/2019   Procedure: OPEN REDUCTION INTERNAL FIXATION (ORIF) DISTAL FEMUR FRACTURE;  Surgeon: Kathryne Hitch, MD;  Location: [facility];  Service: Orthopedics;  Laterality: Right;   Social History:  reports that she has never smoked. She has never used smokeless tobacco. No history on file for alcohol use and drug use.  No Known Allergies  No family history on file.  Prior to Admission medications   Medication Sig Start Date End Date Taking? Authorizing Provider  carvedilol (COREG) 3.125 MG tablet Take 1 tablet (3.125 mg total) by mouth 2 (two) times daily. 03/12/19 04/11/19  Marzetta Board, MD  doxylamine, Sleep, (UNISOM) 25 MG tablet Take 25 mg by mouth at bedtime as needed for sleep.    [provider]  HYDROcodone-acetaminophen (NORCO/VICODIN) 5-325 MG tablet Take 1-2 tablets by  mouth every 4 (four) hours as needed for moderate pain (pain score 4-6). 03/12/19   Marzetta Board, MD    Physical Exam: Vitals:   03/13/23 1330 03/13/23 1445 03/13/23 1452 03/13/23 1459  BP: (!) 145/114 (!) 138/120    Pulse: (!) 114 (!) 42  (!) 122  Resp: 19 19    Temp:   97.6 F (36.4 C)   TempSrc:   Oral   SpO2: 96% 98%    Weight:      Height:       Constitutional:  Elderly female who appears to be in no acute distress at this time. Eyes: PERRL, lids and conjunctivae normal ENMT: Mucous membranes are moist.  Normal dentition.  Neck: normal, supple.  No significant JVD. Respiratory: Normal respiratory effort with mild crackles noted in the lower lung fields.  She is on 2 L of oxygen with O2 saturations maintained. Cardiovascular: Irregular irregular. No extremity edema.   Abdomen: no tenderness, no masses palpated. No hepatosplenomegaly. Bowel sounds positive.  Musculoskeletal: no clubbing / cyanosis. No joint deformity upper and lower extremities. Good ROM, no contractures. Normal muscle tone.  Skin: no rashes, lesions, ulcers. No induration Neurologic: CN 2-12 grossly intact. Sensation intact, DTR normal. Strength 5/5 in all 4.  Psychiatric: Normal judgment and insight. Alert and oriented x 3. Normal mood.   Data Reviewed:  EKG revealed atrial fibrillation at 143 bpm.  Reviewed labs, imaging, and pertinent records as noted above in HPI.  Assessment and Plan:  Atrial fibrillation with RVR Patient was noted to be in A-fib with RVR with heart rates into the 140s.  TSH within normal limits.  Patient had been intermittently given metoprolol IV.  Patient with a prior history of atrial fibrillation as well as DVT back in 2020, but was not on anticoagulation. CHA2DS2-VASc score =7.  Does not appear that she followed up with cardiology as advised. -Admit to a cardiac telemetry bed -Continue heparin per pharmacy -Goal potassium at least 4 and magnesium at least 2.  Replace electrolytes as needed. -Cardizem gtt per pharmacy -Check echocardiogram -Cardiology consulted w\ill follow-up for any further recommendations  Suspected left appendage thrombus Acute.  CT angiogram noted concern for left appendage thrombus.  Patient had been placed on heparin due to being in atrial fibrillation. -Continue heparin per pharmacy -Follow-up echocardiogram  Peripheral  vascular disease Patient presented with complaints of severe left leg pain.  CT angiogram noted occlusion of the left popliteal artery and occlusion of the right superficial femoral artery.  Case had been discussed with vascular surgery who recommended continue heparin drip. -Continue heparin drip -Add-on lipid panel -Check vascular arterial duplex of the lower extremities -Check vascular ABI with and without TBI's -Vascular surgery consulted, will follow-up for any further recommendations.  Congestive heart failure exacerbation Acute.  On physical exam patient did not appear to be grossly fluid overloaded on physical exam, but did have some crackles in the lower lung fields..  CT angiogram head noted pulmonary edema with small bilateral pleural effusions and cardiomegaly.  BNP elevated at 799.9.  Last echocardiogram noted EF to be 60 to 65% with indeterminate diastolic parameters back in 2020. -Strict I&Os and daily weights -Lasix 40 mg IV x 1 dose.  Reassess and determine need of further IV diuresis in a.m. -Follow-up echocardiogram  Elevated troponin Acute.  Initial high-sensitivity troponins 55-> 57.  Suspect secondary to demand. -Follow-up echocardiogram  History of DVT Previous DVT of the left lower extremity was thought to be  send for follow-up following surgical procedure.  Patient had been on Xarelto temporarily.  No DVT was noted on Doppler ultrasound of the lower extremities today.  Lesion of mediastinum Acute.  Patient noted to have a 1.3 cm nodule in the anterior mediastinum.  Differential included thymoma, metastatic adenopathy, and less likely ectopic thyroid tissue or lymphoma. -Consider further workup once patient's acute issues have resolved.  Hyponatremia Hypochloremia Acute.  Sodium 133 and chloride 93.   -Recheck in AM.  VTE prophylaxis: Heparin Advance Care Planning:   Code Status: Full Code   Consults: Cardiology and vascular surgery  Family Communication:  Patient's nephew updated at bedside.  Severity of Illness: The appropriate patient status for this patient is INPATIENT. Inpatient status is judged to be reasonable and necessary in order to provide the required intensity of service to ensure the patient's safety. The patient's presenting symptoms, physical exam findings, and initial radiographic and laboratory data in the context of their chronic comorbidities is felt to place them at high risk for further clinical deterioration. Furthermore, it is not anticipated that the patient will be medically stable for discharge from the hospital within 2 midnights of admission.   * I certify that at the point of admission it is my clinical judgment that the patient will require inpatient hospital care spanning beyond 2 midnights from the point of admission due to high intensity of service, high risk for further deterioration and high frequency of surveillance required.*  Author: Clydie Braun, MD 03/13/2023 3:13 PM  For on call review [email or web address].

## 2023-03-13 NOTE — ED Notes (Signed)
ED TO INPATIENT HANDOFF REPORT  ED Nurse Name and Phone #:  Marisue Ivan 72  S Name/Age/Gender Sarah Wolf 88 y.o. female Room/Bed: 006C/006C  Code Status   Code Status: Prior  Home/SNF/Other Home Patient oriented to: self, place, and time Is this baseline? Yes   Triage Complete: Triage complete  Chief Complaint Atrial fibrillation with RVR (HCC) [I48.91]  Triage Note No notes on file   Allergies No Known Allergies  Level of Care/Admitting Diagnosis ED Disposition     ED Disposition  Admit   Condition  --   Comment  Hospital Area: [facility] [[id number]]  Level of Care: Telemetry Cardiac [[id number]]  May admit patient to [facility] or [facility] if equivalent level of care is available:: No  Covid Evaluation: Asymptomatic - no recent exposure (last 10 days) testing not required  Diagnosis: Atrial fibrillation with RVR [facility] [[id number]]  Admitting Physician: Clydie Braun [[id number]]  Attending Physician: Clydie Braun [[id number]]  Certification:: I certify this patient will need inpatient services for at least 2 midnights  Expected Medical Readiness: 03/15/2023          B Medical/Surgery History No past medical history on file. Past Surgical History:  Procedure Laterality Date   ORIF FEMUR FRACTURE Right 03/08/2019   Procedure: OPEN REDUCTION INTERNAL FIXATION (ORIF) DISTAL FEMUR FRACTURE;  Surgeon: Kathryne Hitch, MD;  Location: [facility];  Service: Orthopedics;  Laterality: Right;     A IV Location/Drains/Wounds Patient Lines/Drains/Airways Status     Active Line/Drains/Airways     Name Placement date Placement time Site Days   Peripheral IV 03/13/23 22 G Left Antecubital 03/13/23  --  Antecubital  less than 1   Peripheral IV 03/13/23 20 G Right Antecubital 03/13/23  1149  Antecubital  less than 1   External Urinary Catheter 03/10/19  2240  --  1464   Incision (Closed) 03/08/19 Leg Right 03/08/19  1715  -- 1466             Intake/Output Last 24 hours No intake or output data in the 24 hours ending 03/13/23 1530  Labs/Imaging Results for orders placed or performed during the hospital encounter of 03/13/23 (from the past 48 hour(s))  Comprehensive metabolic panel     Status: Abnormal   Collection Time: 03/13/23 10:30 AM  Result Value Ref Range   Sodium 133 (L) 135 - 145 mmol/L   Potassium 3.5 3.5 - 5.1 mmol/L   Chloride 93 (L) 98 - 111 mmol/L   CO2 27 22 - 32 mmol/L   Glucose, Bld 140 (H) 70 - 99 mg/dL    Comment: Glucose reference range applies only to samples taken after fasting for at least 8 hours.   BUN 14 8 - 23 mg/dL   Creatinine, Ser 0.27 0.44 - 1.00 mg/dL   Calcium 9.1 8.9 - 25.3 mg/dL   Total Protein 7.0 6.5 - 8.1 g/dL   Albumin 3.6 3.5 - 5.0 g/dL   AST 28 15 - 41 U/L   ALT 19 0 - 44 U/L   Alkaline Phosphatase 60 38 - 126 U/L   Total Bilirubin 1.3 (H) 0.3 - 1.2 mg/dL   GFR, Estimated >66 >44 mL/min    Comment: (NOTE) Calculated using the CKD-EPI Creatinine Equation (2021)    Anion gap 13 5 - 15    Comment: Performed at [facility], [address], [place], [place] [address]  CBC with Differential     Status: None  Collection Time: 03/13/23 10:30 AM  Result Value Ref Range   WBC 7.0 4.0 - 10.5 K/uL   RBC 4.58 3.87 - 5.11 MIL/uL   Hemoglobin 14.6 12.0 - 15.0 g/dL   HCT 91.4 78.2 - 95.6 %   MCV 98.5 80.0 - 100.0 fL   MCH 31.9 26.0 - 34.0 pg   MCHC 32.4 30.0 - 36.0 g/dL   RDW 21.3 08.6 - 57.8 %   Platelets 181 150 - 400 K/uL   nRBC 0.0 0.0 - 0.2 %   Neutrophils Relative % 73 %   Neutro Abs 5.1 1.7 - 7.7 K/uL   Lymphocytes Relative 17 %   Lymphs Abs 1.2 0.7 - 4.0 K/uL   Monocytes Relative 7 %   Monocytes Absolute 0.5 0.1 - 1.0 K/uL   Eosinophils Relative 2 %   Eosinophils Absolute 0.2 0.0 - 0.5 K/uL   Basophils Relative 1 %   Basophils Absolute 0.1 0.0 - 0.1 K/uL   Immature Granulocytes 0 %   Abs Immature Granulocytes 0.02 0.00 - 0.07 K/uL    Comment: Performed at  [facility], [address], [place], [place] [address]  Protime-INR     Status: None   Collection Time: 03/13/23 10:30 AM  Result Value Ref Range   Prothrombin Time 15.1 11.4 - 15.2 seconds   INR 1.2 0.8 - 1.2    Comment: (NOTE) INR goal varies based on device and disease states. Performed at [facility], [address] [place], [place] [address]   Troponin I (High Sensitivity)     Status: Abnormal   Collection Time: 03/13/23 10:30 AM  Result Value Ref Range   Troponin I (High Sensitivity) 55 (H) <18 ng/L    Comment: (NOTE) Elevated high sensitivity troponin I (hsTnI) values and significant  changes across serial measurements may suggest ACS but many other  chronic and acute conditions are known to elevate hsTnI results.  Refer to the "Links" section for chest pain algorithms and additional  guidance. Performed at [facility], [address] [place], [place] [address]   Brain natriuretic peptide     Status: Abnormal   Collection Time: 03/13/23 10:30 AM  Result Value Ref Range   B Natriuretic Peptide 799.9 (H) 0.0 - 100.0 pg/mL    Comment: Performed at [facility], [address], [place], [place] [address]  TSH     Status: None   Collection Time: 03/13/23 10:30 AM  Result Value Ref Range   TSH 2.940 0.350 - 4.500 uIU/mL    Comment: Performed by a 3rd Generation assay with a functional sensitivity of <=0.01 uIU/mL. Performed at [facility], [address], [place], [place] [address]   Troponin I (High Sensitivity)     Status: Abnormal   Collection Time: 03/13/23 12:35 PM  Result Value Ref Range   Troponin I (High Sensitivity) 57 (H) <18 ng/L    Comment: (NOTE) Elevated high sensitivity troponin I (hsTnI) values and significant  changes across serial measurements may suggest ACS but many other  chronic and acute conditions are known to elevate hsTnI results.  Refer to the "Links" section for chest pain algorithms and additional   guidance. Performed at [facility], [address], [place], [place] [address]    CT ANGIO AO+BIFEM W & OR WO CONTRAST  Result Date: 03/13/2023 CLINICAL DATA:  Chest pain, iliac artery dissection EXAM: CT ANGIOGRAPHY OF CHEST CT ANGIOGRAPHY OF ABDOMINAL AORTA  WITH ILIOFEMORAL RUNOFF TECHNIQUE: Multidetector CT imaging of the chest abdomen, pelvis and lower extremities was performed using the standard protocol during bolus administration of intravenous contrast. Multiplanar CT image reconstructions and MIPs were obtained to evaluate the vascular anatomy. RADIATION DOSE REDUCTION: This exam was performed according to the departmental dose-optimization program which includes automated exposure control, adjustment of the mA and/or kV according to patient size and/or use of iterative reconstruction technique. CONTRAST:  OMNIPAQUE IOHEXOL 350 MG/ML SOLN COMPARISON:  None Available. FINDINGS: CTA CHEST Cardiovascular: Conventional 3 vessel arch anatomy. Elongation of the isthmus and descending thoracic aorta consistent with type 3 arch. Extensive atherosclerotic plaque. The aortic root is normal in caliber at 3 cm measured at the sinuses of Valsalva. The ascending thoracic aorta is normal in caliber at 3.2 cm. The transverse thoracic aorta is mildly aneurysmal at 3.2 cm. Flow artifact present throughout the descending thoracic aorta consistent with poor cardiac output. The main pulmonary artery is normal in caliber. No evidence of pulmonary embolus. Cardiomegaly with significant biatrial enlargement. Incomplete opacification of the left atrial appendage raising concern for left atrial appendage thrombus. Mediastinum: Normal thyroid gland. Nonspecific 1.3 cm soft tissue nodule in the anterior mediastinum (image 61 series 7). Pericardial fluid is present in the superior pericardial recesses. Lungs/Pleura: Interlobular septal thickening consistent with mild interstitial edema. Diffuse bronchial wall  thickening. Elevation of the left hemidiaphragm. Small bilateral pleural effusions with associated bibasilar atelectasis. Musculoskeletal: No acute fracture or aggressive appearing lytic or blastic osseous lesion. CTA ABD/PELVIS WITH RUNOFF VASCULAR Aorta: Extensive irregular and ulcerated atherosclerotic plaque throughout the abdominal aorta. No evidence of aneurysm or dissection. Celiac: Patent without evidence of aneurysm, dissection, vasculitis or significant stenosis. SMA: Calcified plaque results in at least moderate stenosis of the proximal SMA. Conventional hepatic arterial anatomy. Renals: Single renal arteries bilaterally. Calcified plaque results in moderate stenosis of the proximal right renal artery. Calcified plaque results in mild stenosis of the left renal artery. IMA: Patent without evidence of aneurysm, dissection, vasculitis or significant stenosis. RIGHT Lower Extremity Inflow: Calcified plaque throughout the right common iliac artery. 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 The external iliac artery is spared from disease. Outflow: The common femoral artery is widely patent. Profunda femoral branches are widely patent. Complete occlusion of the superficial femoral artery beginning in the upper thigh several cm beyond the vessel origin. The vessel then reconstitutes via collateral flow in the distal thigh. Multifocal stenoses of moderate to high-grade throughout Hunter's canal. Diffusely diseased but patent popliteal artery without significant stenosis. Runoff: High origin of the posterior tibial artery arising from the proximal P3 segment of the popliteal artery. Long tibioperoneal trunk which then bifurcates into the anterior tibial and peroneal arteries. Patent 3 vessel runoff to the ankle. LEFT Lower Extremity Inflow: Calcified plaque throughout the iliac system. No aneurysm, dissection stenosis or occlusion. Outflow: The common femoral and profunda femoral arteries are widely  patent. The superficial femoral artery demonstrates scattered mild stenoses in the upper and mid thigh. There is a more moderate focal stenosis within Hunter's canal. The popliteal artery is patent in the P1 and P2 segments but then occludes in the P3 segment and remains occluded throughout the trifurcation. Runoff: Popliteal distal reconstitution of the anterior tibial and possibly posterior tibial arteries. Veins: No focal venous abnormality. Review of the MIP images confirms the above findings. NON-VASCULAR Hepatobiliary: Diffuse low attenuation of the hepatic parenchyma consistent with hepatic steatosis. Multiple circumscribed water attenuation lesions scattered throughout the liver  consistent with cysts. No definite enhancing lesion. Gallbladder is unremarkable. No intra or extrahepatic biliary ductal dilatation. Pancreas: Unremarkable. No pancreatic ductal dilatation or surrounding inflammatory changes. Partially involved in the upward migration into the thorax due to the presence of the hiatal hernia. Spleen: Normal in size without focal abnormality. Adrenals/Urinary Tract: Normal adrenal glands. No evidence of hydronephrosis, nephrolithiasis or enhancing renal mass. Small circumscribed water attenuation lesions bilaterally most consistent with simple cysts. No imaging follow-up recommended. Ureters and bladder are unremarkable. Stomach/Bowel: Colonic diverticular disease without CT evidence of active inflammation. No focal bowel wall thickening or evidence of obstruction. Unremarkable appendix. Lymphatic: No suspicious lymphadenopathy. Reproductive: Uterus and bilateral adnexa are unremarkable. Other: No abdominal wall hernia or abnormality. No abdominopelvic ascites. Musculoskeletal: No acute fracture or aggressive appearing lytic or blastic osseous lesion. Multilevel degenerative disc disease and bilateral facet arthropathy. IMPRESSION: CTA CHEST 1. Marked cardiomegaly with biatrial enlargement, small  bilateral pleural effusions and interlobular septal thickening consistent with pulmonary edema. Overall findings suggest CHF. 2. Incomplete opacification of the left atrial appendage raises concern for thrombus within the left atrial appendage. Echocardiography could further evaluate if clinically warranted. 3. No evidence of aortic dissection or aneurysm. 4. No evidence of acute pulmonary embolism. 5. Nonspecific 1.3 cm soft tissue nodule in the anterior mediastinum. Differential considerations include thymoma, metastatic adenopathy, and less likely ectopic thyroid tissue or lymphoma. 6. Large hiatal hernia with chronic elevation of the left hemidiaphragm and upward migration of the stomach and tail of the pancreas into the chest. CTA ABD/PELVIS WITH RUNOFF 1. 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 Differential considerations include embolic phenomenon (note possible left atrial appendage thrombus described above) versus thrombosis from underlying atherosclerotic plaque. 2. Occlusion right superficial femoral artery beginning several cm beyond the origin in extending into the distal thigh. Imaging appearance favors chronic atherosclerotic occlusive disease rather than embolization. 3. High origin of the right posterior tibial artery noted incidentally. 4. Extensive irregular and ulcerated atherosclerotic plaque throughout the entire abdominal aorta. 5. Moderate stenosis of the proximal SMA. 6. Moderate stenosis of the origin of the right renal artery. 7. Mild stenosis of the proximal left renal artery. 8. 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 9. Hepatic steatosis. 10. Colonic diverticular disease without CT evidence of active inflammation. 11. Additional ancillary findings as above. Electronically Signed   By: Malachy Moan M.D.   On: 03/13/2023 14:13   CT ANGIO CHEST AORTA W/CM &/OR WO/CM  Result Date:  03/13/2023 CLINICAL DATA:  Chest pain, iliac artery dissection EXAM: CT ANGIOGRAPHY OF CHEST CT ANGIOGRAPHY OF ABDOMINAL AORTA WITH ILIOFEMORAL RUNOFF TECHNIQUE: Multidetector CT imaging of the chest abdomen, pelvis and lower extremities was performed using the standard protocol during bolus administration of intravenous contrast. Multiplanar CT image reconstructions and MIPs were obtained to evaluate the vascular anatomy. RADIATION DOSE REDUCTION: This exam was performed according to the departmental dose-optimization program which includes automated exposure control, adjustment of the mA and/or kV according to patient size and/or use of iterative reconstruction technique. CONTRAST:  OMNIPAQUE IOHEXOL 350 MG/ML SOLN COMPARISON:  None Available. FINDINGS: CTA CHEST Cardiovascular: Conventional 3 vessel arch anatomy. Elongation of the isthmus and descending thoracic aorta consistent with type 3 arch. Extensive atherosclerotic plaque. The aortic root is normal in caliber at 3 cm measured at the sinuses of Valsalva. The ascending thoracic aorta is normal in caliber at 3.2 cm. The transverse thoracic aorta is mildly aneurysmal  at 3.2 cm. Flow artifact present throughout the descending thoracic aorta consistent with poor cardiac output. The main pulmonary artery is normal in caliber. No evidence of pulmonary embolus. Cardiomegaly with significant biatrial enlargement. Incomplete opacification of the left atrial appendage raising concern for left atrial appendage thrombus. Mediastinum: Normal thyroid gland. Nonspecific 1.3 cm soft tissue nodule in the anterior mediastinum (image 61 series 7). Pericardial fluid is present in the superior pericardial recesses. Lungs/Pleura: Interlobular septal thickening consistent with mild interstitial edema. Diffuse bronchial wall thickening. Elevation of the left hemidiaphragm. Small bilateral pleural effusions with associated bibasilar atelectasis. Musculoskeletal: No acute  fracture or aggressive appearing lytic or blastic osseous lesion. CTA ABD/PELVIS WITH RUNOFF VASCULAR Aorta: Extensive irregular and ulcerated atherosclerotic plaque throughout the abdominal aorta. No evidence of aneurysm or dissection. Celiac: Patent without evidence of aneurysm, dissection, vasculitis or significant stenosis. SMA: Calcified plaque results in at least moderate stenosis of the proximal SMA. Conventional hepatic arterial anatomy. Renals: Single renal arteries bilaterally. Calcified plaque results in moderate stenosis of the proximal right renal artery. Calcified plaque results in mild stenosis of the left renal artery. IMA: Patent without evidence of aneurysm, dissection, vasculitis or significant stenosis. RIGHT Lower Extremity Inflow: Calcified plaque throughout the right common iliac artery. 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 The external iliac artery is spared from disease. Outflow: The common femoral artery is widely patent. Profunda femoral branches are widely patent. Complete occlusion of the superficial femoral artery beginning in the upper thigh several cm beyond the vessel origin. The vessel then reconstitutes via collateral flow in the distal thigh. Multifocal stenoses of moderate to high-grade throughout Hunter's canal. Diffusely diseased but patent popliteal artery without significant stenosis. Runoff: High origin of the posterior tibial artery arising from the proximal P3 segment of the popliteal artery. Long tibioperoneal trunk which then bifurcates into the anterior tibial and peroneal arteries. Patent 3 vessel runoff to the ankle. LEFT Lower Extremity Inflow: Calcified plaque throughout the iliac system. No aneurysm, dissection stenosis or occlusion. Outflow: The common femoral and profunda femoral arteries are widely patent. The superficial femoral artery demonstrates scattered mild stenoses in the upper and mid thigh. There is a more moderate focal stenosis  within Hunter's canal. The popliteal artery is patent in the P1 and P2 segments but then occludes in the P3 segment and remains occluded throughout the trifurcation. Runoff: Popliteal distal reconstitution of the anterior tibial and possibly posterior tibial arteries. Veins: No focal venous abnormality. Review of the MIP images confirms the above findings. NON-VASCULAR Hepatobiliary: Diffuse low attenuation of the hepatic parenchyma consistent with hepatic steatosis. Multiple circumscribed water attenuation lesions scattered throughout the liver consistent with cysts. No definite enhancing lesion. Gallbladder is unremarkable. No intra or extrahepatic biliary ductal dilatation. Pancreas: Unremarkable. No pancreatic ductal dilatation or surrounding inflammatory changes. Partially involved in the upward migration into the thorax due to the presence of the hiatal hernia. Spleen: Normal in size without focal abnormality. Adrenals/Urinary Tract: Normal adrenal glands. No evidence of hydronephrosis, nephrolithiasis or enhancing renal mass. Small circumscribed water attenuation lesions bilaterally most consistent with simple cysts. No imaging follow-up recommended. Ureters and bladder are unremarkable. Stomach/Bowel: Colonic diverticular disease without CT evidence of active inflammation. No focal bowel wall thickening or evidence of obstruction. Unremarkable appendix. Lymphatic: No suspicious lymphadenopathy. Reproductive: Uterus and bilateral adnexa are unremarkable. Other: No abdominal wall hernia or abnormality. No abdominopelvic ascites. Musculoskeletal: No acute fracture or aggressive appearing lytic or blastic osseous lesion. Multilevel degenerative disc disease and bilateral  facet arthropathy. IMPRESSION: CTA CHEST 1. Marked cardiomegaly with biatrial enlargement, small bilateral pleural effusions and interlobular septal thickening consistent with pulmonary edema. Overall findings suggest CHF. 2. Incomplete  opacification of the left atrial appendage raises concern for thrombus within the left atrial appendage. Echocardiography could further evaluate if clinically warranted. 3. No evidence of aortic dissection or aneurysm. 4. No evidence of acute pulmonary embolism. 5. Nonspecific 1.3 cm soft tissue nodule in the anterior mediastinum. Differential considerations include thymoma, metastatic adenopathy, and less likely ectopic thyroid tissue or lymphoma. 6. Large hiatal hernia with chronic elevation of the left hemidiaphragm and upward migration of the stomach and tail of the pancreas into the chest. CTA ABD/PELVIS WITH RUNOFF 1. 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 Differential considerations include embolic phenomenon (note possible left atrial appendage thrombus described above) versus thrombosis from underlying atherosclerotic plaque. 2. Occlusion right superficial femoral artery beginning several cm beyond the origin in extending into the distal thigh. Imaging appearance favors chronic atherosclerotic occlusive disease rather than embolization. 3. High origin of the right posterior tibial artery noted incidentally. 4. Extensive irregular and ulcerated atherosclerotic plaque throughout the entire abdominal aorta. 5. Moderate stenosis of the proximal SMA. 6. Moderate stenosis of the origin of the right renal artery. 7. Mild stenosis of the proximal left renal artery. 8. 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 9. Hepatic steatosis. 10. Colonic diverticular disease without CT evidence of active inflammation. 11. Additional ancillary findings as above. Electronically Signed   By: Malachy Moan M.D.   On: 03/13/2023 14:13   DG Chest Portable 1 View  Result Date: 03/13/2023 CLINICAL DATA:  chest pain EXAM: PORTABLE CHEST - 1 VIEW COMPARISON:  03/08/2019 FINDINGS: Bilateral interstitial edema or infiltrates, increased since  previous. Chronic elevation of left diaphragmatic leaflet. Heart size upper limits normal. Aortic Atherosclerosis (ICD10-170.0). No effusion. Visualized bones unremarkable. IMPRESSION: Bilateral interstitial edema or infiltrates. Electronically Signed   By: Corlis Leak M.D.   On: 03/13/2023 12:02   VAS [place] LOWER EXTREMITY VENOUS (DVT) (ONLY [facility] & [facility])  Result Date: 03/13/2023  Lower Venous DVT Study Patient Name:  LAPORSHE CATOE  Date of Exam:   03/13/2023 Medical Rec #: [id number]   Accession #:    [id number] Date of Birth: [birth year]   Patient Gender: F Patient Age:   29 years Exam Location[facility]  [facility] Procedure:      VAS [place] LOWER EXTREMITY VENOUS (DVT) Referring Phys: Jomarie Longs STEVENS --------------------------------------------------------------------------------  Indications: Right leg pain x1 day.  Comparison Study: 03-29-2019 Prior right lower extremity venous study s/p                   surgery was negative for DVT.                    No other prior history. Performing Technologist: Jean Rosenthal RDMS, RVT  Examination Guidelines: A complete evaluation includes B-mode imaging, spectral Doppler, color Doppler, and power Doppler as needed of all accessible portions of each vessel. Bilateral testing is considered an integral part of a complete examination. Limited examinations for reoccurring indications may be performed as noted. The reflux portion of the exam is performed with the patient in reverse Trendelenburg.  +---------+---------------+---------+-----------+----------+--------------+ RIGHT    CompressibilityPhasicitySpontaneityPropertiesThrombus Aging +---------+---------------+---------+-----------+----------+--------------+ CFV      Full           Yes      Yes                                 +---------+---------------+---------+-----------+----------+--------------+  SFJ      Full                                                         +---------+---------------+---------+-----------+----------+--------------+ FV Prox  Full                                                        +---------+---------------+---------+-----------+----------+--------------+ FV Mid   Full                                                        +---------+---------------+---------+-----------+----------+--------------+ FV DistalFull                                                        +---------+---------------+---------+-----------+----------+--------------+ PFV      Full                                                        +---------+---------------+---------+-----------+----------+--------------+ POP      Full           Yes      Yes                                 +---------+---------------+---------+-----------+----------+--------------+ PTV      Full                                                        +---------+---------------+---------+-----------+----------+--------------+ PERO     Full                                                        +---------+---------------+---------+-----------+----------+--------------+   +----+---------------+---------+-----------+----------+--------------+ LEFTCompressibilityPhasicitySpontaneityPropertiesThrombus Aging +----+---------------+---------+-----------+----------+--------------+ CFV Full           Yes      Yes                                 +----+---------------+---------+-----------+----------+--------------+    Summary: RIGHT: - There is no evidence of deep vein thrombosis in the lower extremity.  - No cystic structure found in the popliteal fossa.  - INCIDENTAL: Occlusion of the proximal to mid SFA. Dampened monophasic flow noted in the distal SFA, Pop A, and PTA.  LEFT: - No  evidence of common femoral vein obstruction.   *See table(s) above for measurements and observations.    Preliminary     Pending Labs Unresulted Labs (From admission, onward)      Start     Ordered   03/14/23 0500  Heparin level (unfractionated)  Daily,   R     Placed in "And" Linked Group   03/13/23 1426   03/14/23 0500  CBC  Daily,   R     Placed in "And" Linked Group   03/13/23 1426   03/13/23 2300  Heparin level (unfractionated)  Once-Timed,   URGENT        03/13/23 1426   03/13/23 1419  APTT  Once,   STAT        03/13/23 1419            Vitals/Pain Today's Vitals   03/13/23 1330 03/13/23 1445 03/13/23 1452 03/13/23 1459  BP: (!) 145/114 (!) 138/120    Pulse: (!) 114 (!) 42  (!) 122  Resp: 19 19    Temp:   97.6 F (36.4 C)   TempSrc:   Oral   SpO2: 96% 98%    Weight:      Height:      PainSc:        Isolation Precautions No active isolations  Medications Medications  metoprolol tartrate (LOPRESSOR) injection 5 mg (5 mg Intravenous Not Given 03/13/23 1319)  metoprolol tartrate (LOPRESSOR) injection 5 mg (5 mg Intravenous Given 03/13/23 1434)  heparin ADULT infusion 100 units/mL (25000 units/256mL) (1,050 Units/hr Intravenous New Bag/Given 03/13/23 1450)  morphine (PF) 2 MG/ML injection 2 mg (2 mg Intravenous Given 03/13/23 1119)  ondansetron (ZOFRAN) injection 4 mg (4 mg Intravenous Given 03/13/23 1119)  iohexol (OMNIPAQUE) 350 MG/ML injection 100 mL (100 mLs Intravenous Contrast Given 03/13/23 1312)  heparin bolus via infusion 4,000 Units (4,000 Units Intravenous Bolus from Bag 03/13/23 1451)    Mobility walks     Focused Assessments Cardiac Assessment Handoff:  Cardiac Rhythm: Atrial fibrillation No results found for: "CKTOTAL", "CKMB", "CKMBINDEX", "TROPONINI" No results found for: "DDIMER" Does the Patient currently have chest pain? No   , Pulmonary Assessment Handoff:  Lung sounds: Bilateral Breath Sounds: Expiratory wheezes O2 Device: Nasal Cannula O2 Flow Rate (L/min): 1.5 L/min    R Recommendations: See Admitting Provider Note  Report given to:   Additional Notes:

## 2023-03-13 NOTE — Progress Notes (Signed)
Patient with multiple medical issues that are chronic in nature, but have not been continuously addressed for several months.  She has not been taking any daily maintenance medications and will likely require multiple new medications on discharge.  Patient would benefit from further evaluation of her home situation and needs by social work and case management.

## 2023-03-13 NOTE — ED Provider Notes (Signed)
[place] EMERGENCY DEPARTMENT AT [facility] Provider Note   CSN: [id number] Arrival date & time: 03/13/23  1009     History {Add pertinent medical, surgical, social history, OB history to HPI:1} Chief Complaint  Patient presents with   Leg Pain    Pt coming from home via EMS with bilateral lower leg pain, with pain being worse in the right leg. Currently pain is a 6/10. Pt found to be in afib-RVR. She denies chest pain or shob.     Sarah Wolf is a 88 y.o. female.  This is an 88 year old female who does not seek regular medical care, who presents to the emergency department today due to atrial fibrillation with accelerated ventricular response.  Patient had called EMS because of pain in her right leg that had been sudden onset.  She denies any trauma to the area.  When EMS got there, they noted that the patient had a heart rate in the 160s.  Patient does not regularly go to her doctor.  She had not been experiencing any pain in her chest.   Leg Pain      Home Medications Prior to Admission medications   Medication Sig Start Date End Date Taking? Authorizing Provider  carvedilol (COREG) 3.125 MG tablet Take 1 tablet (3.125 mg total) by mouth 2 (two) times daily. 03/12/19 04/11/19  Marzetta Board, MD  doxylamine, Sleep, (UNISOM) 25 MG tablet Take 25 mg by mouth at bedtime as needed for sleep.    [provider]  HYDROcodone-acetaminophen (NORCO/VICODIN) 5-325 MG tablet Take 1-2 tablets by mouth every 4 (four) hours as needed for moderate pain (pain score 4-6). 03/12/19   Spongberg, Susy Frizzle, MD      Allergies    Patient has no known allergies.    Review of Systems   Review of Systems  Physical Exam Updated Vital Signs BP (!) 168/153 (BP Location: Right Arm)   Pulse (!) 144   Temp (!) 97.4 F (36.3 C) (Oral)   Resp (!) 22   Ht 5\' 5"  (1.651 m)   Wt 63.5 kg   SpO2 97%   BMI 23.30 kg/m  Physical Exam Vitals reviewed.  Cardiovascular:      Rate and Rhythm: Tachycardia present. Rhythm irregular.     Heart sounds:     No friction rub.     Comments: Slightly weak PT pulse in the right leg compared to left Pulmonary:     Effort: Pulmonary effort is normal.     Breath sounds: Normal breath sounds.  Abdominal:     General: Abdomen is flat.  Neurological:     General: No focal deficit present.     Mental Status: She is alert and oriented to person, place, and time.     ED Results / Procedures / Treatments   Labs (all labs ordered are listed, but only abnormal results are displayed) Labs Reviewed  COMPREHENSIVE METABOLIC PANEL  CBC WITH DIFFERENTIAL/PLATELET  PROTIME-INR  BRAIN NATRIURETIC PEPTIDE  TSH  TROPONIN I (HIGH SENSITIVITY)    EKG None  Radiology No results found.  Procedures .Critical Care  Performed by: Arletha Pili, DO Authorized by: Arletha Pili, DO   Critical care provider statement:    Critical care time (minutes):  43   Critical care was necessary to treat or prevent imminent or life-threatening deterioration of the following conditions:  Circulatory failure   Critical care was time spent personally by me on the following activities:  Development of  treatment plan with patient or surrogate, discussions with consultants, evaluation of patient's response to treatment, examination of patient, ordering and review of laboratory studies, ordering and review of radiographic studies, ordering and performing treatments and interventions, pulse oximetry, re-evaluation of patient's condition and review of old charts   Care discussed with: admitting provider     {Document cardiac monitor, telemetry assessment procedure when appropriate:1}  Medications Ordered in ED Medications  metoprolol tartrate (LOPRESSOR) injection 5 mg (has no administration in time range)    ED Course/ Medical Decision Making/ A&P   {   Click here for ABCD2, HEART and other calculatorsREFRESH Note before signing :1}                               Medical Decision Making No acute changes 88 year old female here today with leg pain, new onset A-fib.  Plan-possibility of the patient's 2 symptoms could be related.  Will obtain a DVT ultrasound of the right lower extremity.  On my exam, there is no swelling, no erythema, no point tenderness.  There is been no fall.  Patient has previously had surgery on her right hip.  Would consider plain films of the right hip.  Will start the patient on some metoprolol as her rate is currently between 140 and 160.  Patient overall spry, sharp.  She maintains all of her ADLs independently.  Reassessment-patient's DVT ultrasound was concerning for reduced flow, so I obtained CT angiography which showed occlusions in the patient's right superficial femoral, left popliteal artery.  CTA also showed possible atrial appendage thrombus which would be consistent with the patient's symptoms.  We are keeping her rate controlled with metoprolol.  I discussed this patient's care with vascular surgery and they are going to evaluate the patient.  Fortunately, patient does have collateral flow around her occlusions.  I have started the patient on a heparin drip.  I will admit the patient to hospitalist.    Amount and/or Complexity of Data Reviewed Labs: ordered. Radiology: ordered.  Risk Prescription drug management.   ***  {Document critical care time when appropriate:1} {Document review of labs and clinical decision tools ie heart score, Chads2Vasc2 etc:1}  {Document your independent review of radiology images, and any outside records:1} {Document your discussion with family members, caretakers, and with consultants:1} {Document social determinants of health affecting pt's care:1} {Document your decision making why or why not admission, treatments were needed:1} Final Clinical Impression(s) / ED Diagnoses Final diagnoses:  None    Rx / DC Orders ED Discharge Orders      None

## 2023-03-13 NOTE — Progress Notes (Signed)
Lower extremity venous right study completed.  Preliminary results relayed to Andria Meuse, DO.   See CV Proc for preliminary results report.   Jean Rosenthal, RDMS, RVT

## 2023-03-13 NOTE — Consult Note (Addendum)
Cardiology Consultation   Patient ID: Sarah Wolf MRN: [id number]; DOB: 1934-12-08  Admit date: 03/13/2023 Date of Consult: 03/13/2023  PCP:  Tally Joe, MD   [facility] Providers Cardiologist:  Dietrich Pates, MD   Patient Profile:   Sarah Wolf is a 88 y.o. female with a hx of acute DVT 2020 no longer on OAC, PAF noted during echo in 2020, and hypertension who is being seen 03/13/2023 for the evaluation of Afib and CHF at the request of Dr. Katrinka Blazing.  History of Present Illness:   Ms. Glazner suffered a mechanical fall 02/2019 resulting in a distal femur fracture. She underwent ORIF 03/08/19 and discharged to SNF. An echocardiogram 03/09/19 showed LVEF 60-65% and mild LVH, noted to be in Afib - does not appear she has seen cardiology. Xarelto was prescribed at discharge, but due to an error, treatment does was not provided to her - in an ortho note 04/2019, she was re-started on 20 mg xarelto.  She developed acute DVT in the right peroneal vein 03/29/19 and placed on treatment dose xarelto. Does not appear this was continued long term.   She was seen by ortho 01/2023 due to multiple falls. She was noted to not have a PCP and did not follow up with referrals placed during her hospitalization in 2020.  She presented to [facility] with right leg pain. [place] was negative for DVT but did show incidental finding of occlusion of the proximal to mid SFA and dampened flow in the distal SFA, popliteal artery and PTA. Shew as noted to be in Afib RVR in the 140s.   BNP 800 HS troponin 55 --> 57 sCr 0.87 with K 3.5 Albumin 3.6  Cardiology consulted.  On my interview, she doesn't really remember what brought her to the hospital. She states she has fallen twice this year. She does not report SOB, DOE, orthopnea, or PND.  She denies chest pain. She does say her right leg has hurt twice since being in the ER. She does not recall any medical problems.    No past medical history on file.  Past Surgical  History:  Procedure Laterality Date   ORIF FEMUR FRACTURE Right 03/08/2019   Procedure: OPEN REDUCTION INTERNAL FIXATION (ORIF) DISTAL FEMUR FRACTURE;  Surgeon: Kathryne Hitch, MD;  Location: [facility];  Service: Orthopedics;  Laterality: Right;     Home Medications:  Prior to Admission medications   Medication Sig Start Date End Date Taking? Authorizing Provider  carvedilol (COREG) 3.125 MG tablet Take 1 tablet (3.125 mg total) by mouth 2 (two) times daily. 03/12/19 04/11/19  Marzetta Board, MD  HYDROcodone-acetaminophen (NORCO/VICODIN) 5-325 MG tablet Take 1-2 tablets by mouth every 4 (four) hours as needed for moderate pain (pain score 4-6). Patient not taking: Reported on 03/13/2023 03/12/19   Marzetta Board, MD    Inpatient Medications: Scheduled Meds:  amiodarone  150 mg Intravenous Once   furosemide  40 mg Intravenous BID   potassium chloride  40 mEq Oral STAT   sodium chloride flush  3 mL Intravenous Q12H   Continuous Infusions:  amiodarone     Followed by   amiodarone     heparin 1,050 Units/hr (03/13/23 1450)   PRN Meds: acetaminophen **OR** acetaminophen, albuterol  Allergies:   No Known Allergies  Social History:   Social History   Socioeconomic History   Marital status: Single    Spouse name: Not on file   Number of children: Not on file  Years of education: Not on file   Highest education level: Not on file  Occupational History   Not on file  Tobacco Use   Smoking status: Never   Smokeless tobacco: Never  Substance and Sexual Activity   Alcohol use: Not on file   Drug use: Not on file   Sexual activity: Not on file  Other Topics Concern   Not on file  Social History Narrative   Not on file   Social Determinants of Health   Financial Resource Strain: Not on file  Food Insecurity: Not on file  Transportation Needs: Not on file  Physical Activity: Not on file  Stress: Not on file  Social Connections: Not on file   Intimate Partner Violence: Not on file    Family History:   No family history on file.   ROS:  Please see the history of present illness.   All other ROS reviewed and negative.     Physical Exam/Data:   Vitals:   03/13/23 1330 03/13/23 1445 03/13/23 1452 03/13/23 1459  BP: (!) 145/114 (!) 138/120    Pulse: (!) 114 (!) 42  (!) 122  Resp: 19 19    Temp:   97.6 F (36.4 C)   TempSrc:   Oral   SpO2: 96% 98%    Weight:      Height:       No intake or output data in the 24 hours ending 03/13/23 1603    03/13/2023   10:24 AM 03/08/2019    3:04 PM  Last 3 Weights  Weight (lbs) 140 lb 140 lb  Weight (kg) 63.504 kg 63.504 kg     Body mass index is 23.3 kg/m.  General:  elderly female in NAD HEENT: normal Neck: no JVD Cardiac:  irregular rhythm, regular rate Lungs:  crackles in bases  Abd: soft, nontender, no hepatomegaly  Ext: no edema Musculoskeletal:  No deformities, BUE and BLE strength normal and equal Skin: warm and dry  Neuro:  CNs 2-12 intact, no focal abnormalities noted Psych:  Normal affect   EKG:  The EKG was personally reviewed and demonstrates:  Afib with VR 143 Telemetry:  Telemetry was personally reviewed and demonstrates:  Afib with VR in the 80s  Relevant CV Studies:  Echo pending today  Echo 2020:  1. The left ventricle has normal systolic function with an ejection  fraction of 60-65%. The cavity size was normal. There is mild asymmetric  left ventricular hypertrophy. Left ventricular diastolic function could  not be evaluated secondary to atrial  fibrillation.   2. The right ventricle has normal systolic function. The cavity was  normal. There is no increase in right ventricular wall thickness.   3. The aortic valve is grossly normal. Mild thickening of the aortic  valve. No stenosis of the aortic valve. Mild aortic annular calcification  noted.   4. The tricuspid valve is grossly normal.   5. The aorta is normal unless otherwise noted.   6.  The aortic root and ascending aorta are normal in size and structure.   7. The atrial septum is grossly normal.   Laboratory Data:  High Sensitivity Troponin:   Recent Labs  Lab 03/13/23 1030 03/13/23 1235  TROPONINIHS 55* 57*     Chemistry Recent Labs  Lab 03/13/23 1030  NA 133*  K 3.5  CL 93*  CO2 27  GLUCOSE 140*  BUN 14  CREATININE 0.87  CALCIUM 9.1  GFRNONAA >60  ANIONGAP 13  Recent Labs  Lab 03/13/23 1030  PROT 7.0  ALBUMIN 3.6  AST 28  ALT 19  ALKPHOS 60  BILITOT 1.3*   Lipids No results for input(s): "CHOL", "TRIG", "HDL", "LABVLDL", "LDLCALC", "CHOLHDL" in the last 168 hours.  Hematology Recent Labs  Lab 03/13/23 1030  WBC 7.0  RBC 4.58  HGB 14.6  HCT 45.1  MCV 98.5  MCH 31.9  MCHC 32.4  RDW 14.1  PLT 181   Thyroid  Recent Labs  Lab 03/13/23 1030  TSH 2.940    BNP Recent Labs  Lab 03/13/23 1030  BNP 799.9*    DDimer No results for input(s): "DDIMER" in the last 168 hours.   Radiology/Studies:  CT ANGIO AO+BIFEM W & OR WO CONTRAST  Result Date: 03/13/2023 CLINICAL DATA:  Chest pain, iliac artery dissection EXAM: CT ANGIOGRAPHY OF CHEST CT ANGIOGRAPHY OF ABDOMINAL AORTA WITH ILIOFEMORAL RUNOFF TECHNIQUE: Multidetector CT imaging of the chest abdomen, pelvis and lower extremities was performed using the standard protocol during bolus administration of intravenous contrast. Multiplanar CT image reconstructions and MIPs were obtained to evaluate the vascular anatomy. RADIATION DOSE REDUCTION: This exam was performed according to the departmental dose-optimization program which includes automated exposure control, adjustment of the mA and/or kV according to patient size and/or use of iterative reconstruction technique. CONTRAST:  OMNIPAQUE IOHEXOL 350 MG/ML SOLN COMPARISON:  None Available. FINDINGS: CTA CHEST Cardiovascular: Conventional 3 vessel arch anatomy. Elongation of the isthmus and descending thoracic aorta consistent with type  3 arch. Extensive atherosclerotic plaque. The aortic root is normal in caliber at 3 cm measured at the sinuses of Valsalva. The ascending thoracic aorta is normal in caliber at 3.2 cm. The transverse thoracic aorta is mildly aneurysmal at 3.2 cm. Flow artifact present throughout the descending thoracic aorta consistent with poor cardiac output. The main pulmonary artery is normal in caliber. No evidence of pulmonary embolus. Cardiomegaly with significant biatrial enlargement. Incomplete opacification of the left atrial appendage raising concern for left atrial appendage thrombus. Mediastinum: Normal thyroid gland. Nonspecific 1.3 cm soft tissue nodule in the anterior mediastinum (image 61 series 7). Pericardial fluid is present in the superior pericardial recesses. Lungs/Pleura: Interlobular septal thickening consistent with mild interstitial edema. Diffuse bronchial wall thickening. Elevation of the left hemidiaphragm. Small bilateral pleural effusions with associated bibasilar atelectasis. Musculoskeletal: No acute fracture or aggressive appearing lytic or blastic osseous lesion. CTA ABD/PELVIS WITH RUNOFF VASCULAR Aorta: Extensive irregular and ulcerated atherosclerotic plaque throughout the abdominal aorta. No evidence of aneurysm or dissection. Celiac: Patent without evidence of aneurysm, dissection, vasculitis or significant stenosis. SMA: Calcified plaque results in at least moderate stenosis of the proximal SMA. Conventional hepatic arterial anatomy. Renals: Single renal arteries bilaterally. Calcified plaque results in moderate stenosis of the proximal right renal artery. Calcified plaque results in mild stenosis of the left renal artery. IMA: Patent without evidence of aneurysm, dissection, vasculitis or significant stenosis. RIGHT Lower Extremity Inflow: Calcified plaque throughout the right common iliac artery. 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 The external iliac artery is spared from  disease. Outflow: The common femoral artery is widely patent. Profunda femoral branches are widely patent. Complete occlusion of the superficial femoral artery beginning in the upper thigh several cm beyond the vessel origin. The vessel then reconstitutes via collateral flow in the distal thigh. Multifocal stenoses of moderate to high-grade throughout Hunter's canal. Diffusely diseased but patent popliteal artery without significant stenosis. Runoff: High origin of the posterior tibial artery arising from the  proximal P3 segment of the popliteal artery. Long tibioperoneal trunk which then bifurcates into the anterior tibial and peroneal arteries. Patent 3 vessel runoff to the ankle. LEFT Lower Extremity Inflow: Calcified plaque throughout the iliac system. No aneurysm, dissection stenosis or occlusion. Outflow: The common femoral and profunda femoral arteries are widely patent. The superficial femoral artery demonstrates scattered mild stenoses in the upper and mid thigh. There is a more moderate focal stenosis within Hunter's canal. The popliteal artery is patent in the P1 and P2 segments but then occludes in the P3 segment and remains occluded throughout the trifurcation. Runoff: Popliteal distal reconstitution of the anterior tibial and possibly posterior tibial arteries. Veins: No focal venous abnormality. Review of the MIP images confirms the above findings. NON-VASCULAR Hepatobiliary: Diffuse low attenuation of the hepatic parenchyma consistent with hepatic steatosis. Multiple circumscribed water attenuation lesions scattered throughout the liver consistent with cysts. No definite enhancing lesion. Gallbladder is unremarkable. No intra or extrahepatic biliary ductal dilatation. Pancreas: Unremarkable. No pancreatic ductal dilatation or surrounding inflammatory changes. Partially involved in the upward migration into the thorax due to the presence of the hiatal hernia. Spleen: Normal in size without focal  abnormality. Adrenals/Urinary Tract: Normal adrenal glands. No evidence of hydronephrosis, nephrolithiasis or enhancing renal mass. Small circumscribed water attenuation lesions bilaterally most consistent with simple cysts. No imaging follow-up recommended. Ureters and bladder are unremarkable. Stomach/Bowel: Colonic diverticular disease without CT evidence of active inflammation. No focal bowel wall thickening or evidence of obstruction. Unremarkable appendix. Lymphatic: No suspicious lymphadenopathy. Reproductive: Uterus and bilateral adnexa are unremarkable. Other: No abdominal wall hernia or abnormality. No abdominopelvic ascites. Musculoskeletal: No acute fracture or aggressive appearing lytic or blastic osseous lesion. Multilevel degenerative disc disease and bilateral facet arthropathy. IMPRESSION: CTA CHEST 1. Marked cardiomegaly with biatrial enlargement, small bilateral pleural effusions and interlobular septal thickening consistent with pulmonary edema. Overall findings suggest CHF. 2. Incomplete opacification of the left atrial appendage raises concern for thrombus within the left atrial appendage. Echocardiography could further evaluate if clinically warranted. 3. No evidence of aortic dissection or aneurysm. 4. No evidence of acute pulmonary embolism. 5. Nonspecific 1.3 cm soft tissue nodule in the anterior mediastinum. Differential considerations include thymoma, metastatic adenopathy, and less likely ectopic thyroid tissue or lymphoma. 6. Large hiatal hernia with chronic elevation of the left hemidiaphragm and upward migration of the stomach and tail of the pancreas into the chest. CTA ABD/PELVIS WITH RUNOFF 1. 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 Differential considerations include embolic phenomenon (note possible left atrial appendage thrombus described above) versus thrombosis from underlying atherosclerotic plaque.  2. Occlusion right superficial femoral artery beginning several cm beyond the origin in extending into the distal thigh. Imaging appearance favors chronic atherosclerotic occlusive disease rather than embolization. 3. High origin of the right posterior tibial artery noted incidentally. 4. Extensive irregular and ulcerated atherosclerotic plaque throughout the entire abdominal aorta. 5. Moderate stenosis of the proximal SMA. 6. Moderate stenosis of the origin of the right renal artery. 7. Mild stenosis of the proximal left renal artery. 8. 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 9. Hepatic steatosis. 10. Colonic diverticular disease without CT evidence of active inflammation. 11. Additional ancillary findings as above. Electronically Signed   By: Malachy Moan M.D.   On: 03/13/2023 14:13   CT ANGIO CHEST AORTA W/CM &/OR WO/CM  Result Date: 03/13/2023 CLINICAL DATA:  Chest pain, iliac artery dissection EXAM: CT ANGIOGRAPHY OF  CHEST CT ANGIOGRAPHY OF ABDOMINAL AORTA WITH ILIOFEMORAL RUNOFF TECHNIQUE: Multidetector CT imaging of the chest abdomen, pelvis and lower extremities was performed using the standard protocol during bolus administration of intravenous contrast. Multiplanar CT image reconstructions and MIPs were obtained to evaluate the vascular anatomy. RADIATION DOSE REDUCTION: This exam was performed according to the departmental dose-optimization program which includes automated exposure control, adjustment of the mA and/or kV according to patient size and/or use of iterative reconstruction technique. CONTRAST:  OMNIPAQUE IOHEXOL 350 MG/ML SOLN COMPARISON:  None Available. FINDINGS: CTA CHEST Cardiovascular: Conventional 3 vessel arch anatomy. Elongation of the isthmus and descending thoracic aorta consistent with type 3 arch. Extensive atherosclerotic plaque. The aortic root is normal in caliber at 3 cm measured at the sinuses of Valsalva. The ascending thoracic aorta is normal in  caliber at 3.2 cm. The transverse thoracic aorta is mildly aneurysmal at 3.2 cm. Flow artifact present throughout the descending thoracic aorta consistent with poor cardiac output. The main pulmonary artery is normal in caliber. No evidence of pulmonary embolus. Cardiomegaly with significant biatrial enlargement. Incomplete opacification of the left atrial appendage raising concern for left atrial appendage thrombus. Mediastinum: Normal thyroid gland. Nonspecific 1.3 cm soft tissue nodule in the anterior mediastinum (image 61 series 7). Pericardial fluid is present in the superior pericardial recesses. Lungs/Pleura: Interlobular septal thickening consistent with mild interstitial edema. Diffuse bronchial wall thickening. Elevation of the left hemidiaphragm. Small bilateral pleural effusions with associated bibasilar atelectasis. Musculoskeletal: No acute fracture or aggressive appearing lytic or blastic osseous lesion. CTA ABD/PELVIS WITH RUNOFF VASCULAR Aorta: Extensive irregular and ulcerated atherosclerotic plaque throughout the abdominal aorta. No evidence of aneurysm or dissection. Celiac: Patent without evidence of aneurysm, dissection, vasculitis or significant stenosis. SMA: Calcified plaque results in at least moderate stenosis of the proximal SMA. Conventional hepatic arterial anatomy. Renals: Single renal arteries bilaterally. Calcified plaque results in moderate stenosis of the proximal right renal artery. Calcified plaque results in mild stenosis of the left renal artery. IMA: Patent without evidence of aneurysm, dissection, vasculitis or significant stenosis. RIGHT Lower Extremity Inflow: Calcified plaque throughout the right common iliac artery. 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 The external iliac artery is spared from disease. Outflow: The common femoral artery is widely patent. Profunda femoral branches are widely patent. Complete occlusion of the superficial femoral artery  beginning in the upper thigh several cm beyond the vessel origin. The vessel then reconstitutes via collateral flow in the distal thigh. Multifocal stenoses of moderate to high-grade throughout Hunter's canal. Diffusely diseased but patent popliteal artery without significant stenosis. Runoff: High origin of the posterior tibial artery arising from the proximal P3 segment of the popliteal artery. Long tibioperoneal trunk which then bifurcates into the anterior tibial and peroneal arteries. Patent 3 vessel runoff to the ankle. LEFT Lower Extremity Inflow: Calcified plaque throughout the iliac system. No aneurysm, dissection stenosis or occlusion. Outflow: The common femoral and profunda femoral arteries are widely patent. The superficial femoral artery demonstrates scattered mild stenoses in the upper and mid thigh. There is a more moderate focal stenosis within Hunter's canal. The popliteal artery is patent in the P1 and P2 segments but then occludes in the P3 segment and remains occluded throughout the trifurcation. Runoff: Popliteal distal reconstitution of the anterior tibial and possibly posterior tibial arteries. Veins: No focal venous abnormality. Review of the MIP images confirms the above findings. NON-VASCULAR Hepatobiliary: Diffuse low attenuation of the hepatic parenchyma consistent with hepatic steatosis. Multiple circumscribed water  attenuation lesions scattered throughout the liver consistent with cysts. No definite enhancing lesion. Gallbladder is unremarkable. No intra or extrahepatic biliary ductal dilatation. Pancreas: Unremarkable. No pancreatic ductal dilatation or surrounding inflammatory changes. Partially involved in the upward migration into the thorax due to the presence of the hiatal hernia. Spleen: Normal in size without focal abnormality. Adrenals/Urinary Tract: Normal adrenal glands. No evidence of hydronephrosis, nephrolithiasis or enhancing renal mass. Small circumscribed water  attenuation lesions bilaterally most consistent with simple cysts. No imaging follow-up recommended. Ureters and bladder are unremarkable. Stomach/Bowel: Colonic diverticular disease without CT evidence of active inflammation. No focal bowel wall thickening or evidence of obstruction. Unremarkable appendix. Lymphatic: No suspicious lymphadenopathy. Reproductive: Uterus and bilateral adnexa are unremarkable. Other: No abdominal wall hernia or abnormality. No abdominopelvic ascites. Musculoskeletal: No acute fracture or aggressive appearing lytic or blastic osseous lesion. Multilevel degenerative disc disease and bilateral facet arthropathy. IMPRESSION: CTA CHEST 1. Marked cardiomegaly with biatrial enlargement, small bilateral pleural effusions and interlobular septal thickening consistent with pulmonary edema. Overall findings suggest CHF. 2. Incomplete opacification of the left atrial appendage raises concern for thrombus within the left atrial appendage. Echocardiography could further evaluate if clinically warranted. 3. No evidence of aortic dissection or aneurysm. 4. No evidence of acute pulmonary embolism. 5. Nonspecific 1.3 cm soft tissue nodule in the anterior mediastinum. Differential considerations include thymoma, metastatic adenopathy, and less likely ectopic thyroid tissue or lymphoma. 6. Large hiatal hernia with chronic elevation of the left hemidiaphragm and upward migration of the stomach and tail of the pancreas into the chest. CTA ABD/PELVIS WITH RUNOFF 1. 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 Differential considerations include embolic phenomenon (note possible left atrial appendage thrombus described above) versus thrombosis from underlying atherosclerotic plaque. 2. Occlusion right superficial femoral artery beginning several cm beyond the origin in extending into the distal thigh. Imaging appearance favors chronic  atherosclerotic occlusive disease rather than embolization. 3. High origin of the right posterior tibial artery noted incidentally. 4. Extensive irregular and ulcerated atherosclerotic plaque throughout the entire abdominal aorta. 5. Moderate stenosis of the proximal SMA. 6. Moderate stenosis of the origin of the right renal artery. 7. Mild stenosis of the proximal left renal artery. 8. 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 9. Hepatic steatosis. 10. Colonic diverticular disease without CT evidence of active inflammation. 11. Additional ancillary findings as above. Electronically Signed   By: Malachy Moan M.D.   On: 03/13/2023 14:13   DG Chest Portable 1 View  Result Date: 03/13/2023 CLINICAL DATA:  chest pain EXAM: PORTABLE CHEST - 1 VIEW COMPARISON:  03/08/2019 FINDINGS: Bilateral interstitial edema or infiltrates, increased since previous. Chronic elevation of left diaphragmatic leaflet. Heart size upper limits normal. Aortic Atherosclerosis (ICD10-170.0). No effusion. Visualized bones unremarkable. IMPRESSION: Bilateral interstitial edema or infiltrates. Electronically Signed   By: Corlis Leak M.D.   On: 03/13/2023 12:02   VAS [place] LOWER EXTREMITY VENOUS (DVT) (ONLY [facility] & [facility])  Result Date: 03/13/2023  Lower Venous DVT Study Patient Name:  KRISTANNA MADEN  Date of Exam:   03/13/2023 Medical Rec #: [id number]   Accession #:    [id number] Date of Birth: 1935/03/07   Patient Gender: F Patient Age:   59 years Exam Location:  [facility] Procedure:      VAS [place] LOWER EXTREMITY VENOUS (DVT) Referring Phys: Jomarie Longs STEVENS --------------------------------------------------------------------------------  Indications: Right leg pain x1 day.  Comparison Study: 03-29-2019 Prior right lower  extremity venous study s/p                   surgery was negative for DVT.                    No other prior history. Performing Technologist: Jean Rosenthal RDMS, RVT  Examination Guidelines: A complete evaluation  includes B-mode imaging, spectral Doppler, color Doppler, and power Doppler as needed of all accessible portions of each vessel. Bilateral testing is considered an integral part of a complete examination. Limited examinations for reoccurring indications may be performed as noted. The reflux portion of the exam is performed with the patient in reverse Trendelenburg.  +---------+---------------+---------+-----------+----------+--------------+ RIGHT    CompressibilityPhasicitySpontaneityPropertiesThrombus Aging +---------+---------------+---------+-----------+----------+--------------+ CFV      Full           Yes      Yes                                 +---------+---------------+---------+-----------+----------+--------------+ SFJ      Full                                                        +---------+---------------+---------+-----------+----------+--------------+ FV Prox  Full                                                        +---------+---------------+---------+-----------+----------+--------------+ FV Mid   Full                                                        +---------+---------------+---------+-----------+----------+--------------+ FV DistalFull                                                        +---------+---------------+---------+-----------+----------+--------------+ PFV      Full                                                        +---------+---------------+---------+-----------+----------+--------------+ POP      Full           Yes      Yes                                 +---------+---------------+---------+-----------+----------+--------------+ PTV      Full                                                        +---------+---------------+---------+-----------+----------+--------------+  PERO     Full                                                         +---------+---------------+---------+-----------+----------+--------------+   +----+---------------+---------+-----------+----------+--------------+ LEFTCompressibilityPhasicitySpontaneityPropertiesThrombus Aging +----+---------------+---------+-----------+----------+--------------+ CFV Full           Yes      Yes                                 +----+---------------+---------+-----------+----------+--------------+    Summary: RIGHT: - There is no evidence of deep vein thrombosis in the lower extremity.  - No cystic structure found in the popliteal fossa.  - INCIDENTAL: Occlusion of the proximal to mid SFA. Dampened monophasic flow noted in the distal SFA, Pop A, and PTA.  LEFT: - No evidence of common femoral vein obstruction.   *See table(s) above for measurements and observations.    Preliminary      Assessment and Plan:   Atrial fibrillation with RVR - she was started on amiodarone gtt - rates now in the 80s - TSH 2.940 - biatrial enlargement on CTA - she is not hypotensive - but risk cardioversion with amiodarone with suspected clot in her atrium - consider discontinuing amiodarone and attempting rate control, although options limited of EF is severely reduced - will start cardizem gtt   Question thrombus in left atrial appendage - echo pending - heparin gtt   Need for chronic anticoagulation This patients CHA2DS2-VASc Score and unadjusted Ischemic Stroke Rate (% per year) is equal to 11.2 % stroke rate/year from a score of 7 (CHF, HTN, 2VTE, female, 2 age) - did not continue xarelto after acute DVT in 2020 - now on heparin gtt    Acute heart failure - BNP 800 - Echo pending - CXR with edema vs infiltrates - scheduled for IV lasix this evening, has not received lasix yet - she is on 3 L HF [place]   Aortic atherosclerosis - noted on CTA chest   Soft tissue mass anterior to mediastinum - question thymoma vs metastatic adenopathy - defer to primary   PAD - CT  complete occlusion of the popliteal artery concerning for embolic etiology from left atrial appendage thrombus vs atherosclerotic plaque - SMA stenosis, RAS on right, right internal iliac artery stenosis - recommend VVS consult    Risk Assessment/Risk Scores:    For questions or updates, please contact [facility] Please consult [email or web address] for contact info under    Signed, Marcelino Duster, [place]  03/13/2023 4:03 PM   Patient seen and examined   I agree with findings as noted by A [facility] above  Pt is an 88 yo with hx  of PAF, DVT, HTN, and falls with fx. Normal LVEF in 2020  Presented to ER today with R leg pain   Found to have occlusion of prox to mid SFA, Dampened in distal vessel Found to be in afib with RVR      Note CT done in ER   LA appendage did not fill with contrast; cannot completely r/o  thrombus  The pt denies palpitations   No SOB at rest  ON exam, Neck  JVP is elevated    No bruits Lung   Clear anteriorly    Cardiac  Irreg irreg  No S3  No signif murmurs Abd is supple   No hepatomegaly Ext  No LE edema     Afib   Duration unknown    With CT findings would recomm stopping amiodarone   Do not want to hasten conversion to SR Start IV dilt for rate control Continue heparin  CHF    Volume statis is increased some   Patient has gotten lasix   Echo ordered to reassess LVEF       WIll continue to follow   Dietrich Pates MD

## 2023-03-13 NOTE — Consult Note (Signed)
Hospital Consult    Reason for Consult: Bilateral lower extremity pain Referring Physician: Dr. Katrinka Blazing MRN #:  [id number]  History of Present Illness: This is a 88 y.o. female without history of vascular disease presents with bilateral lower extremity right greater than left pain that began at 330 this morning.  She states initially the pain was above the knee radiating down the lateral side of her right leg and she also had pain in her left leg below the knee.  She states that she can always feel and move the legs but that she is frequently unsteady on her feet and was this morning.  She states that the pain has been treated well with pain medicine at the hospital and currently she is without pain and can move and feel her feet.  She was subsequently found to be in atrial fibrillation heparin had not started at the time of my exam.  No past medical history on file.  Past Surgical History:  Procedure Laterality Date   ORIF FEMUR FRACTURE Right 03/08/2019   Procedure: OPEN REDUCTION INTERNAL FIXATION (ORIF) DISTAL FEMUR FRACTURE;  Surgeon: Kathryne Hitch, MD;  Location: [facility];  Service: Orthopedics;  Laterality: Right;    No Known Allergies  Prior to Admission medications   Medication Sig Start Date End Date Taking? Authorizing Provider  carvedilol (COREG) 3.125 MG tablet Take 1 tablet (3.125 mg total) by mouth 2 (two) times daily. 03/12/19 04/11/19  Marzetta Board, MD  HYDROcodone-acetaminophen (NORCO/VICODIN) 5-325 MG tablet Take 1-2 tablets by mouth every 4 (four) hours as needed for moderate pain (pain score 4-6). Patient not taking: Reported on 03/13/2023 03/12/19   Marzetta Board, MD    Social History   Socioeconomic History   Marital status: Single    Spouse name: Not on file   Number of children: Not on file   Years of education: Not on file   Highest education level: Not on file  Occupational History   Not on file  Tobacco Use   Smoking status:  Never   Smokeless tobacco: Never  Substance and Sexual Activity   Alcohol use: Not on file   Drug use: Not on file   Sexual activity: Not on file  Other Topics Concern   Not on file  Social History Narrative   Not on file   Social Determinants of Health   Financial Resource Strain: Not on file  Food Insecurity: Not on file  Transportation Needs: Not on file  Physical Activity: Not on file  Stress: Not on file  Social Connections: Not on file  Intimate Partner Violence: Not on file     No family history on file.   Review of Systems  Constitutional: Negative.   HENT: Negative.    Eyes: Negative.   Respiratory: Negative.    Cardiovascular:  Positive for leg swelling.  Gastrointestinal: Negative.   Musculoskeletal:  Positive for joint pain.       Leg pain  Skin: Negative.   Neurological: Negative.   Endo/Heme/Allergies:  Bruises/bleeds easily.  Psychiatric/Behavioral: Negative.        Physical Examination  Vitals:   03/13/23 1452 03/13/23 1459  BP:    Pulse:  (!) 122  Resp:    Temp: 97.6 F (36.4 C)   SpO2:     Body mass index is 23.3 kg/m.  Physical Exam HENT:     Head: Normocephalic.     Nose: Nose normal.  Eyes:     Pupils: Pupils are  equal, round, and reactive to light.  Cardiovascular:     Pulses:          Femoral pulses are 2+ on the right side and 2+ on the left side.      Popliteal pulses are 0 on the right side and 0 on the left side.     Comments: Cannot detect any signals bilateral lower extremities with Doppler Pulmonary:     Effort: Pulmonary effort is normal.  Abdominal:     General: Abdomen is flat.     Palpations: Abdomen is soft. There is no mass.  Skin:    General: Skin is warm and dry.     Capillary Refill: Capillary refill takes 2 to 3 seconds.  Neurological:     General: No focal deficit present.     Mental Status: She is alert.     Comments: She has sensation and motor intact to both feet      CBC    Component  Value Date/Time   WBC 7.0 03/13/2023 1030   RBC 4.58 03/13/2023 1030   HGB 14.6 03/13/2023 1030   HCT 45.1 03/13/2023 1030   PLT 181 03/13/2023 1030   MCV 98.5 03/13/2023 1030   MCH 31.9 03/13/2023 1030   MCHC 32.4 03/13/2023 1030   RDW 14.1 03/13/2023 1030   LYMPHSABS 1.2 03/13/2023 1030   MONOABS 0.5 03/13/2023 1030   EOSABS 0.2 03/13/2023 1030   BASOSABS 0.1 03/13/2023 1030    BMET    Component Value Date/Time   NA 133 (L) 03/13/2023 1030   K 3.5 03/13/2023 1030   CL 93 (L) 03/13/2023 1030   CO2 27 03/13/2023 1030   GLUCOSE 140 (H) 03/13/2023 1030   BUN 14 03/13/2023 1030   CREATININE 0.87 03/13/2023 1030   CALCIUM 9.1 03/13/2023 1030   GFRNONAA >60 03/13/2023 1030   GFRAA >60 03/11/2019 0535    COAGS: Lab Results  Component Value Date   INR 1.2 03/13/2023   INR 1.0 03/08/2019     Non-Invasive Vascular Imaging:   IMPRESSION: CTA CHEST   1. Marked cardiomegaly with biatrial enlargement, small bilateral pleural effusions and interlobular septal thickening consistent with pulmonary edema. Overall findings suggest CHF. 2. Incomplete opacification of the left atrial appendage raises concern for thrombus within the left atrial appendage. Echocardiography could further evaluate if clinically warranted. 3. No evidence of aortic dissection or aneurysm. 4. No evidence of acute pulmonary embolism. 5. Nonspecific 1.3 cm soft tissue nodule in the anterior mediastinum. Differential considerations include thymoma, metastatic adenopathy, and less likely ectopic thyroid tissue or lymphoma. 6. Large hiatal hernia with chronic elevation of the left hemidiaphragm and upward migration of the stomach and tail of the pancreas into the chest.   CTA ABD/PELVIS WITH RUNOFF   1. 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 Differential considerations include embolic phenomenon (note possible left  atrial appendage thrombus described above) versus thrombosis from underlying atherosclerotic plaque. 2. Occlusion right superficial femoral artery beginning several cm beyond the origin in extending into the distal thigh. Imaging appearance favors chronic atherosclerotic occlusive disease rather than embolization. 3. High origin of the right posterior tibial artery noted incidentally. 4. Extensive irregular and ulcerated atherosclerotic plaque throughout the entire abdominal aorta. 5. Moderate stenosis of the proximal SMA. 6. Moderate stenosis of the origin of the right renal artery. 7. Mild stenosis of the proximal left renal artery. 8. 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 9. Hepatic steatosis. 10. Colonic diverticular disease without CT evidence of active inflammation. 11. Additional ancillary findings as above.  ASSESSMENT/PLAN: 88 year old female now with atrial fibrillation with atrial appendage noted on CTA with concern for acute occlusion of the right SFA and left popliteal artery.  Currently she is completely asymptomatic.  She does have severe underlying atherosclerotic plaque bilaterally and including the entire abdominal aorta.  Patient is currently asymptomatic and has full sensation and motor intact of her bilateral feet.  We discussed the options being beginning heparin drip and obtaining duplex ultrasounds with ABIs versus proceeding with angiography and possible percutaneous mechanical thrombectomy.  Certainly she is at high risk for any intervention given the underlying nature of her atherosclerotic disease and any underlying surgical thrombectomy would also be difficult given her chronic disease as well.  Ultimately she may require thrombectomy of the lower extremities.  We will obtain noninvasive studies and follow her physical exam.  We discussed that any recurrent symptoms would require surgical intervention and she demonstrates good understanding.  Torina Ey  C. Randie Heinz, MD [facility] Office: [phone] Pager: [phone]

## 2023-03-13 NOTE — Progress Notes (Signed)
ANTICOAGULATION CONSULT NOTE - Initial Consult  Pharmacy Consult for heparin Indication: VTE tx  No Known Allergies  Patient Measurements: Height: 5\' 5"  (165.1 cm) Weight: 63.5 kg (140 lb) IBW/kg (Calculated) : 57 Heparin Dosing Weight: TBW  Vital Signs: Temp: 97.4 F (36.3 C) (08/26 1028) Temp Source: Oral (08/26 1028) BP: 145/114 (08/26 1330) Pulse Rate: 114 (08/26 1330)  Labs: Recent Labs    03/13/23 1030 03/13/23 1235  HGB 14.6  --   HCT 45.1  --   PLT 181  --   LABPROT 15.1  --   INR 1.2  --   CREATININE 0.87  --   TROPONINIHS 55* 57*    Estimated Creatinine Clearance: 41.8 mL/min (by C-G formula based on SCr of 0.87 mg/dL).   Medical History: No past medical history on file.   Assessment: 13 YOF presenting with leg pain, CT angio with popliteal artery occlusion, also concern for L-atrial appendage thrombus.  She is not on anticoagulation PTA, CBC wnl  Goal of Therapy:  Heparin level 0.3-0.7 units/ml Monitor platelets by anticoagulation protocol: Yes   Plan:  Heparin 4000 units IV x 1, and gtt at 1050 units/hr F/u 8 hour heparin level F/u long term [facility] plan  [name], PharmD, [facility] Clinical Pharmacist ED Pharmacist Phone # [phone] 03/13/2023 2:25 PM

## 2023-03-14 ENCOUNTER — Inpatient Hospital Stay (HOSPITAL_COMMUNITY): Payer: Medicare Other

## 2023-03-14 ENCOUNTER — Encounter (HOSPITAL_COMMUNITY): Payer: Self-pay | Admitting: Internal Medicine

## 2023-03-14 ENCOUNTER — Other Ambulatory Visit (HOSPITAL_COMMUNITY): Payer: Self-pay

## 2023-03-14 DIAGNOSIS — I70221 Atherosclerosis of native arteries of extremities with rest pain, right leg: Secondary | ICD-10-CM

## 2023-03-14 DIAGNOSIS — I7090 Unspecified atherosclerosis: Secondary | ICD-10-CM

## 2023-03-14 DIAGNOSIS — M79604 Pain in right leg: Secondary | ICD-10-CM | POA: Diagnosis not present

## 2023-03-14 DIAGNOSIS — I498 Other specified cardiac arrhythmias: Secondary | ICD-10-CM | POA: Diagnosis not present

## 2023-03-14 DIAGNOSIS — M79605 Pain in left leg: Secondary | ICD-10-CM | POA: Diagnosis not present

## 2023-03-14 LAB — BASIC METABOLIC PANEL
Anion gap: 13 (ref 5–15)
BUN: 22 mg/dL (ref 8–23)
CO2: 26 mmol/L (ref 22–32)
Calcium: 8.8 mg/dL — ABNORMAL LOW (ref 8.9–10.3)
Chloride: 94 mmol/L — ABNORMAL LOW (ref 98–111)
Creatinine, Ser: 0.96 mg/dL (ref 0.44–1.00)
GFR, Estimated: 58 mL/min — ABNORMAL LOW (ref >60)
Glucose, Bld: 125 mg/dL — ABNORMAL HIGH (ref 70–99)
Potassium: 3.9 mmol/L (ref 3.5–5.1)
Sodium: 133 mmol/L — ABNORMAL LOW (ref 135–145)

## 2023-03-14 LAB — ECHOCARDIOGRAM COMPLETE
Area-P 1/2: 4.6 cm2
Height: 65 in
S' Lateral: 2.8 cm
Weight: 2102.31 [oz_av]

## 2023-03-14 LAB — CBC
HCT: 42.8 % (ref 36.0–46.0)
Hemoglobin: 14 g/dL (ref 12.0–15.0)
MCH: 31.1 pg (ref 26.0–34.0)
MCHC: 32.7 g/dL (ref 30.0–36.0)
MCV: 95.1 fL (ref 80.0–100.0)
Platelets: 177 10*3/uL (ref 150–400)
RBC: 4.5 MIL/uL (ref 3.87–5.11)
RDW: 14.1 % (ref 11.5–15.5)
WBC: 10.1 10*3/uL (ref 4.0–10.5)
nRBC: 0 % (ref 0.0–0.2)

## 2023-03-14 LAB — GLUCOSE, CAPILLARY: Glucose-Capillary: 139 mg/dL — ABNORMAL HIGH (ref 70–99)

## 2023-03-14 LAB — SARS CORONAVIRUS 2 BY RT PCR: SARS Coronavirus 2 by RT PCR: NEGATIVE

## 2023-03-14 LAB — HEPARIN LEVEL (UNFRACTIONATED): Heparin Unfractionated: 0.37 [IU]/mL (ref 0.30–0.70)

## 2023-03-14 MED ORDER — ENSURE ENLIVE PO LIQD
237.0000 mL | Freq: Two times a day (BID) | ORAL | Status: DC
  Administered 2023-03-18 – 2023-03-30 (×22): 237 mL via ORAL
  Filled 2023-03-14: qty 237

## 2023-03-14 MED ORDER — CARVEDILOL 3.125 MG PO TABS
3.1250 mg | ORAL_TABLET | Freq: Two times a day (BID) | ORAL | Status: DC
  Administered 2023-03-14 – 2023-03-15 (×2): 3.125 mg via ORAL
  Filled 2023-03-14 (×2): qty 1

## 2023-03-14 NOTE — TOC Benefit Eligibility Note (Addendum)
Patient Product/process development scientist completed.    The patient is insured through [facility]. Patient has Medicare and is not eligible for a copay card, but may be able to apply for patient assistance, if available.    Ran test claim for Eliquis 5 mg and the current 30 day co-pay is $45.00.  Ran test claim for Xarelto 20 mg and the current 30 day co-pay is $45.00.  Ran test claim for enoxaparin (Lovenox) 100 mg/ml and the current 30 day co-pay is $249.00 due to a $150.00 deductible.  Copay will be $99.00 once deductible is met.  Will only pay for 30 syringes every 67 days.  This test claim was processed through [facility]- copay amounts may vary at other pharmacies due to pharmacy/plan contracts, or as the patient moves through the different stages of their insurance plan.     Roland Earl, CPHT Pharmacy Technician III Certified Patient Advocate [facility] Patient Advocate Team Direct Number: [phone]  Fax: [phone]

## 2023-03-14 NOTE — Progress Notes (Signed)
Lower extremity arterial bilateral and ABI w/ TBI study completed.   Please see CV Proc for preliminary results.   Darlin Coco, RDMS, RVT

## 2023-03-14 NOTE — Progress Notes (Addendum)
Rounding Note    Patient Name: Sarah Wolf Date of Encounter: 03/14/2023  [facility] Cardiologist: Dietrich Pates, MD   Subjective   Denies any CP or SOB. Receiving LE duplex study  Inpatient Medications    Scheduled Meds:  feeding supplement  237 mL Oral BID BM   sodium chloride flush  3 mL Intravenous Q12H   Continuous Infusions:  diltiazem (CARDIZEM) infusion 5 mg/hr (03/13/23 1735)   heparin 900 Units/hr (03/14/23 0111)   PRN Meds: acetaminophen **OR** acetaminophen, albuterol, HYDROcodone-acetaminophen   Vital Signs    Vitals:   03/13/23 2015 03/14/23 0040 03/14/23 0500 03/14/23 0754  BP: 132/88 (!) 153/80  (!) 128/98  Pulse: 98 (!) 115  98  Resp: (!) 22 18  20   Temp: 97.6 F (36.4 C) 98.2 F (36.8 C)  97.8 F (36.6 C)  TempSrc: Oral Oral  Oral  SpO2: 90% 92%  98%  Weight:   59.6 kg   Height:       No intake or output data in the 24 hours ending 03/14/23 0936    03/14/2023    5:00 AM 03/13/2023   10:24 AM 03/08/2019    3:04 PM  Last 3 Weights  Weight (lbs) 131 lb 6.3 oz 140 lb 140 lb  Weight (kg) 59.6 kg 63.504 kg 63.504 kg      Telemetry    Atrial fibrillation with HR 90s to low 100s - Personally Reviewed  ECG    Atrial fibrillation with HR 140s - Personally Reviewed  Physical Exam   GEN: No acute distress.   Neck: No JVD Cardiac: irregularly irregular, no murmurs, rubs, or gallops.  Respiratory: Clear to auscultation bilaterally. GI: Soft, nontender, non-distended  MS: No edema; No deformity. Neuro:  Nonfocal  Psych: Normal affect   Labs    High Sensitivity Troponin:   Recent Labs  Lab 03/13/23 1030 03/13/23 1235  TROPONINIHS 55* 57*     Chemistry Recent Labs  Lab 03/13/23 1030 03/13/23 1559  NA 133*  --   K 3.5  --   CL 93*  --   CO2 27  --   GLUCOSE 140*  --   BUN 14  --   CREATININE 0.87  --   CALCIUM 9.1  --   MG  --  2.2  PROT 7.0  --   ALBUMIN 3.6  --   AST 28  --   ALT 19  --   ALKPHOS 60  --    BILITOT 1.3*  --   GFRNONAA >60  --   ANIONGAP 13  --     Lipids  Recent Labs  Lab 03/13/23 1559  CHOL 153  TRIG 40  HDL 47  LDLCALC 98  CHOLHDL 3.3    Hematology Recent Labs  Lab 03/13/23 1030  WBC 7.0  RBC 4.58  HGB 14.6  HCT 45.1  MCV 98.5  MCH 31.9  MCHC 32.4  RDW 14.1  PLT 181   Thyroid  Recent Labs  Lab 03/13/23 1030  TSH 2.940    BNP Recent Labs  Lab 03/13/23 1030  BNP 799.9*    DDimer No results for input(s): "DDIMER" in the last 168 hours.   Radiology    VAS [place] LOWER EXTREMITY VENOUS (DVT) (ONLY [facility] & [facility])  Result Date: 03/13/2023  Lower Venous DVT Study Patient Name:  ROSHAUNDA CHOMA  Date of Exam:   03/13/2023 Medical Rec #: [id number]   Accession #:    [id number] Date of  Birth: 02-Jan-1935   Patient Gender: F Patient Age:   7 years Exam Location:  [facility] Procedure:      VAS [place] LOWER EXTREMITY VENOUS (DVT) Referring Phys: Jomarie Longs STEVENS --------------------------------------------------------------------------------  Indications: Right leg pain x1 day.  Comparison Study: 03-29-2019 Prior right lower extremity venous study s/p                   surgery was negative for DVT.                    No other prior history. Performing Technologist: Jean Rosenthal RDMS, RVT  Examination Guidelines: A complete evaluation includes B-mode imaging, spectral Doppler, color Doppler, and power Doppler as needed of all accessible portions of each vessel. Bilateral testing is considered an integral part of a complete examination. Limited examinations for reoccurring indications may be performed as noted. The reflux portion of the exam is performed with the patient in reverse Trendelenburg.  +---------+---------------+---------+-----------+----------+--------------+ RIGHT    CompressibilityPhasicitySpontaneityPropertiesThrombus Aging +---------+---------------+---------+-----------+----------+--------------+ CFV      Full           Yes      Yes                                  +---------+---------------+---------+-----------+----------+--------------+ SFJ      Full                                                        +---------+---------------+---------+-----------+----------+--------------+ FV Prox  Full                                                        +---------+---------------+---------+-----------+----------+--------------+ FV Mid   Full                                                        +---------+---------------+---------+-----------+----------+--------------+ FV DistalFull                                                        +---------+---------------+---------+-----------+----------+--------------+ PFV      Full                                                        +---------+---------------+---------+-----------+----------+--------------+ POP      Full           Yes      Yes                                 +---------+---------------+---------+-----------+----------+--------------+ PTV  Full                                                        +---------+---------------+---------+-----------+----------+--------------+ PERO     Full                                                        +---------+---------------+---------+-----------+----------+--------------+   +----+---------------+---------+-----------+----------+--------------+ LEFTCompressibilityPhasicitySpontaneityPropertiesThrombus Aging +----+---------------+---------+-----------+----------+--------------+ CFV Full           Yes      Yes                                 +----+---------------+---------+-----------+----------+--------------+    Summary: RIGHT: - There is no evidence of deep vein thrombosis in the lower extremity.  - No cystic structure found in the popliteal fossa.  - INCIDENTAL: Occlusion of the proximal to mid SFA. Dampened monophasic flow noted in the distal SFA, Pop A, and PTA.  LEFT: - No evidence of  common femoral vein obstruction.   *See table(s) above for measurements and observations. Electronically signed by Lemar Livings MD on 03/13/2023 at 4:09:18 PM.    Final    CT ANGIO AO+BIFEM W & OR WO CONTRAST  Result Date: 03/13/2023 CLINICAL DATA:  Chest pain, iliac artery dissection EXAM: CT ANGIOGRAPHY OF CHEST CT ANGIOGRAPHY OF ABDOMINAL AORTA WITH ILIOFEMORAL RUNOFF TECHNIQUE: Multidetector CT imaging of the chest abdomen, pelvis and lower extremities was performed using the standard protocol during bolus administration of intravenous contrast. Multiplanar CT image reconstructions and MIPs were obtained to evaluate the vascular anatomy. RADIATION DOSE REDUCTION: This exam was performed according to the departmental dose-optimization program which includes automated exposure control, adjustment of the mA and/or kV according to patient size and/or use of iterative reconstruction technique. CONTRAST:  OMNIPAQUE IOHEXOL 350 MG/ML SOLN COMPARISON:  None Available. FINDINGS: CTA CHEST Cardiovascular: Conventional 3 vessel arch anatomy. Elongation of the isthmus and descending thoracic aorta consistent with type 3 arch. Extensive atherosclerotic plaque. The aortic root is normal in caliber at 3 cm measured at the sinuses of Valsalva. The ascending thoracic aorta is normal in caliber at 3.2 cm. The transverse thoracic aorta is mildly aneurysmal at 3.2 cm. Flow artifact present throughout the descending thoracic aorta consistent with poor cardiac output. The main pulmonary artery is normal in caliber. No evidence of pulmonary embolus. Cardiomegaly with significant biatrial enlargement. Incomplete opacification of the left atrial appendage raising concern for left atrial appendage thrombus. Mediastinum: Normal thyroid gland. Nonspecific 1.3 cm soft tissue nodule in the anterior mediastinum (image 61 series 7). Pericardial fluid is present in the superior pericardial recesses. Lungs/Pleura: Interlobular septal  thickening consistent with mild interstitial edema. Diffuse bronchial wall thickening. Elevation of the left hemidiaphragm. Small bilateral pleural effusions with associated bibasilar atelectasis. Musculoskeletal: No acute fracture or aggressive appearing lytic or blastic osseous lesion. CTA ABD/PELVIS WITH RUNOFF VASCULAR Aorta: Extensive irregular and ulcerated atherosclerotic plaque throughout the abdominal aorta. No evidence of aneurysm or dissection. Celiac: Patent without evidence of aneurysm, dissection, vasculitis or significant stenosis. SMA: Calcified plaque results in at least moderate stenosis of the proximal SMA.  Conventional hepatic arterial anatomy. Renals: Single renal arteries bilaterally. Calcified plaque results in moderate stenosis of the proximal right renal artery. Calcified plaque results in mild stenosis of the left renal artery. IMA: Patent without evidence of aneurysm, dissection, vasculitis or significant stenosis. RIGHT Lower Extremity Inflow: Calcified plaque throughout the right common iliac artery. 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 The external iliac artery is spared from disease. Outflow: The common femoral artery is widely patent. Profunda femoral branches are widely patent. Complete occlusion of the superficial femoral artery beginning in the upper thigh several cm beyond the vessel origin. The vessel then reconstitutes via collateral flow in the distal thigh. Multifocal stenoses of moderate to high-grade throughout Hunter's canal. Diffusely diseased but patent popliteal artery without significant stenosis. Runoff: High origin of the posterior tibial artery arising from the proximal P3 segment of the popliteal artery. Long tibioperoneal trunk which then bifurcates into the anterior tibial and peroneal arteries. Patent 3 vessel runoff to the ankle. LEFT Lower Extremity Inflow: Calcified plaque throughout the iliac system. No aneurysm, dissection stenosis or  occlusion. Outflow: The common femoral and profunda femoral arteries are widely patent. The superficial femoral artery demonstrates scattered mild stenoses in the upper and mid thigh. There is a more moderate focal stenosis within Hunter's canal. The popliteal artery is patent in the P1 and P2 segments but then occludes in the P3 segment and remains occluded throughout the trifurcation. Runoff: Popliteal distal reconstitution of the anterior tibial and possibly posterior tibial arteries. Veins: No focal venous abnormality. Review of the MIP images confirms the above findings. NON-VASCULAR Hepatobiliary: Diffuse low attenuation of the hepatic parenchyma consistent with hepatic steatosis. Multiple circumscribed water attenuation lesions scattered throughout the liver consistent with cysts. No definite enhancing lesion. Gallbladder is unremarkable. No intra or extrahepatic biliary ductal dilatation. Pancreas: Unremarkable. No pancreatic ductal dilatation or surrounding inflammatory changes. Partially involved in the upward migration into the thorax due to the presence of the hiatal hernia. Spleen: Normal in size without focal abnormality. Adrenals/Urinary Tract: Normal adrenal glands. No evidence of hydronephrosis, nephrolithiasis or enhancing renal mass. Small circumscribed water attenuation lesions bilaterally most consistent with simple cysts. No imaging follow-up recommended. Ureters and bladder are unremarkable. Stomach/Bowel: Colonic diverticular disease without CT evidence of active inflammation. No focal bowel wall thickening or evidence of obstruction. Unremarkable appendix. Lymphatic: No suspicious lymphadenopathy. Reproductive: Uterus and bilateral adnexa are unremarkable. Other: No abdominal wall hernia or abnormality. No abdominopelvic ascites. Musculoskeletal: No acute fracture or aggressive appearing lytic or blastic osseous lesion. Multilevel degenerative disc disease and bilateral facet arthropathy.  IMPRESSION: CTA CHEST 1. Marked cardiomegaly with biatrial enlargement, small bilateral pleural effusions and interlobular septal thickening consistent with pulmonary edema. Overall findings suggest CHF. 2. Incomplete opacification of the left atrial appendage raises concern for thrombus within the left atrial appendage. Echocardiography could further evaluate if clinically warranted. 3. No evidence of aortic dissection or aneurysm. 4. No evidence of acute pulmonary embolism. 5. Nonspecific 1.3 cm soft tissue nodule in the anterior mediastinum. Differential considerations include thymoma, metastatic adenopathy, and less likely ectopic thyroid tissue or lymphoma. 6. Large hiatal hernia with chronic elevation of the left hemidiaphragm and upward migration of the stomach and tail of the pancreas into the chest. CTA ABD/PELVIS WITH RUNOFF 1. 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 Differential considerations include embolic phenomenon (note possible left atrial appendage thrombus described above) versus thrombosis from underlying atherosclerotic plaque. 2. Occlusion  right superficial femoral artery beginning several cm beyond the origin in extending into the distal thigh. Imaging appearance favors chronic atherosclerotic occlusive disease rather than embolization. 3. High origin of the right posterior tibial artery noted incidentally. 4. Extensive irregular and ulcerated atherosclerotic plaque throughout the entire abdominal aorta. 5. Moderate stenosis of the proximal SMA. 6. Moderate stenosis of the origin of the right renal artery. 7. Mild stenosis of the proximal left renal artery. 8. 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 9. Hepatic steatosis. 10. Colonic diverticular disease without CT evidence of active inflammation. 11. Additional ancillary findings as above. Electronically Signed   By: Malachy Moan M.D.   On:  03/13/2023 14:13   CT ANGIO CHEST AORTA W/CM &/OR WO/CM  Result Date: 03/13/2023 CLINICAL DATA:  Chest pain, iliac artery dissection EXAM: CT ANGIOGRAPHY OF CHEST CT ANGIOGRAPHY OF ABDOMINAL AORTA WITH ILIOFEMORAL RUNOFF TECHNIQUE: Multidetector CT imaging of the chest abdomen, pelvis and lower extremities was performed using the standard protocol during bolus administration of intravenous contrast. Multiplanar CT image reconstructions and MIPs were obtained to evaluate the vascular anatomy. RADIATION DOSE REDUCTION: This exam was performed according to the departmental dose-optimization program which includes automated exposure control, adjustment of the mA and/or kV according to patient size and/or use of iterative reconstruction technique. CONTRAST:  OMNIPAQUE IOHEXOL 350 MG/ML SOLN COMPARISON:  None Available. FINDINGS: CTA CHEST Cardiovascular: Conventional 3 vessel arch anatomy. Elongation of the isthmus and descending thoracic aorta consistent with type 3 arch. Extensive atherosclerotic plaque. The aortic root is normal in caliber at 3 cm measured at the sinuses of Valsalva. The ascending thoracic aorta is normal in caliber at 3.2 cm. The transverse thoracic aorta is mildly aneurysmal at 3.2 cm. Flow artifact present throughout the descending thoracic aorta consistent with poor cardiac output. The main pulmonary artery is normal in caliber. No evidence of pulmonary embolus. Cardiomegaly with significant biatrial enlargement. Incomplete opacification of the left atrial appendage raising concern for left atrial appendage thrombus. Mediastinum: Normal thyroid gland. Nonspecific 1.3 cm soft tissue nodule in the anterior mediastinum (image 61 series 7). Pericardial fluid is present in the superior pericardial recesses. Lungs/Pleura: Interlobular septal thickening consistent with mild interstitial edema. Diffuse bronchial wall thickening. Elevation of the left hemidiaphragm. Small bilateral pleural  effusions with associated bibasilar atelectasis. Musculoskeletal: No acute fracture or aggressive appearing lytic or blastic osseous lesion. CTA ABD/PELVIS WITH RUNOFF VASCULAR Aorta: Extensive irregular and ulcerated atherosclerotic plaque throughout the abdominal aorta. No evidence of aneurysm or dissection. Celiac: Patent without evidence of aneurysm, dissection, vasculitis or significant stenosis. SMA: Calcified plaque results in at least moderate stenosis of the proximal SMA. Conventional hepatic arterial anatomy. Renals: Single renal arteries bilaterally. Calcified plaque results in moderate stenosis of the proximal right renal artery. Calcified plaque results in mild stenosis of the left renal artery. IMA: Patent without evidence of aneurysm, dissection, vasculitis or significant stenosis. RIGHT Lower Extremity Inflow: Calcified plaque throughout the right common iliac artery. 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 The external iliac artery is spared from disease. Outflow: The common femoral artery is widely patent. Profunda femoral branches are widely patent. Complete occlusion of the superficial femoral artery beginning in the upper thigh several cm beyond the vessel origin. The vessel then reconstitutes via collateral flow in the distal thigh. Multifocal stenoses of moderate to high-grade throughout Hunter's canal. Diffusely diseased but patent popliteal artery without significant stenosis. Runoff: High origin of the posterior tibial artery arising from the proximal P3  segment of the popliteal artery. Long tibioperoneal trunk which then bifurcates into the anterior tibial and peroneal arteries. Patent 3 vessel runoff to the ankle. LEFT Lower Extremity Inflow: Calcified plaque throughout the iliac system. No aneurysm, dissection stenosis or occlusion. Outflow: The common femoral and profunda femoral arteries are widely patent. The superficial femoral artery demonstrates scattered mild  stenoses in the upper and mid thigh. There is a more moderate focal stenosis within Hunter's canal. The popliteal artery is patent in the P1 and P2 segments but then occludes in the P3 segment and remains occluded throughout the trifurcation. Runoff: Popliteal distal reconstitution of the anterior tibial and possibly posterior tibial arteries. Veins: No focal venous abnormality. Review of the MIP images confirms the above findings. NON-VASCULAR Hepatobiliary: Diffuse low attenuation of the hepatic parenchyma consistent with hepatic steatosis. Multiple circumscribed water attenuation lesions scattered throughout the liver consistent with cysts. No definite enhancing lesion. Gallbladder is unremarkable. No intra or extrahepatic biliary ductal dilatation. Pancreas: Unremarkable. No pancreatic ductal dilatation or surrounding inflammatory changes. Partially involved in the upward migration into the thorax due to the presence of the hiatal hernia. Spleen: Normal in size without focal abnormality. Adrenals/Urinary Tract: Normal adrenal glands. No evidence of hydronephrosis, nephrolithiasis or enhancing renal mass. Small circumscribed water attenuation lesions bilaterally most consistent with simple cysts. No imaging follow-up recommended. Ureters and bladder are unremarkable. Stomach/Bowel: Colonic diverticular disease without CT evidence of active inflammation. No focal bowel wall thickening or evidence of obstruction. Unremarkable appendix. Lymphatic: No suspicious lymphadenopathy. Reproductive: Uterus and bilateral adnexa are unremarkable. Other: No abdominal wall hernia or abnormality. No abdominopelvic ascites. Musculoskeletal: No acute fracture or aggressive appearing lytic or blastic osseous lesion. Multilevel degenerative disc disease and bilateral facet arthropathy. IMPRESSION: CTA CHEST 1. Marked cardiomegaly with biatrial enlargement, small bilateral pleural effusions and interlobular septal thickening  consistent with pulmonary edema. Overall findings suggest CHF. 2. Incomplete opacification of the left atrial appendage raises concern for thrombus within the left atrial appendage. Echocardiography could further evaluate if clinically warranted. 3. No evidence of aortic dissection or aneurysm. 4. No evidence of acute pulmonary embolism. 5. Nonspecific 1.3 cm soft tissue nodule in the anterior mediastinum. Differential considerations include thymoma, metastatic adenopathy, and less likely ectopic thyroid tissue or lymphoma. 6. Large hiatal hernia with chronic elevation of the left hemidiaphragm and upward migration of the stomach and tail of the pancreas into the chest. CTA ABD/PELVIS WITH RUNOFF 1. 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 Differential considerations include embolic phenomenon (note possible left atrial appendage thrombus described above) versus thrombosis from underlying atherosclerotic plaque. 2. Occlusion right superficial femoral artery beginning several cm beyond the origin in extending into the distal thigh. Imaging appearance favors chronic atherosclerotic occlusive disease rather than embolization. 3. High origin of the right posterior tibial artery noted incidentally. 4. Extensive irregular and ulcerated atherosclerotic plaque throughout the entire abdominal aorta. 5. Moderate stenosis of the proximal SMA. 6. Moderate stenosis of the origin of the right renal artery. 7. Mild stenosis of the proximal left renal artery. 8. 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 9. Hepatic steatosis. 10. Colonic diverticular disease without CT evidence of active inflammation. 11. Additional ancillary findings as above. Electronically Signed   By: Malachy Moan M.D.   On: 03/13/2023 14:13   DG Chest Portable 1 View  Result Date: 03/13/2023 CLINICAL DATA:  chest pain EXAM: PORTABLE CHEST - 1 VIEW COMPARISON:   03/08/2019 FINDINGS:  Bilateral interstitial edema or infiltrates, increased since previous. Chronic elevation of left diaphragmatic leaflet. Heart size upper limits normal. Aortic Atherosclerosis (ICD10-170.0). No effusion. Visualized bones unremarkable. IMPRESSION: Bilateral interstitial edema or infiltrates. Electronically Signed   By: Corlis Leak M.D.   On: 03/13/2023 12:02    Cardiac Studies   Echo 03/09/2019  1. The left ventricle has normal systolic function with an ejection  fraction of 60-65%. The cavity size was normal. There is mild asymmetric  left ventricular hypertrophy. Left ventricular diastolic function could  not be evaluated secondary to atrial  fibrillation.   2. The right ventricle has normal systolic function. The cavity was  normal. There is no increase in right ventricular wall thickness.   3. The aortic valve is grossly normal. Mild thickening of the aortic  valve. No stenosis of the aortic valve. Mild aortic annular calcification  noted.   4. The tricuspid valve is grossly normal.   5. The aorta is normal unless otherwise noted.   6. The aortic root and ascending aorta are normal in size and structure.   7. The atrial septum is grossly normal.   Patient Profile     88 y.o. female with PMH of DVT in 2020 (completed course of OAC), PAF noted during echo 2020 and HTN who presented with R leg pain, found to have occlusion of proximal to mid SFA and dampened flow in distal SFA. She was also in afib with RVR with HR 140s. Amiodarone gtt briefly started, however due to embolic risk, amiodarone stopped.   Assessment & Plan    Atrial fibrillation with RVR - briefly on IV amiodarone, stopped due to concerning for possible embolic etiology behind RLE arterial occlusion.   - IV heparin and IV dilitazem. Pending echocardiogram. HR high 90s to low 100s.   CHF: received a dose of 40mg  IV lasix on arrival, euvolemic on exam today. Pending echocardiogram  Occlusion of LE artery:  CTA of abdomen pelvis showed complete occlusion of left popliteal artery and R SFA. CTO of R internal iliac artery. Pending BLE arterial duples and ABI. Vascular surgery may consider intervention later today  Anterior medistinal mass: seen on CTA of chest, 1.3 cm soft tissue nodule in the anterior mediastinum. DD include thymoma, metastatic adenopathy and less likely ectopic thyroid tissue or lymphoma  H/o DVT in 2020  Hiatal hernia: CTA chest showed large hiatal hernia with chronic elevation of left hemidiaphragm  HTN  BP is labile  120s to 150s     Will add back carvedilol       For questions or updates, please contact [facility] Please consult [email or web address] for contact info under        Signed, Azalee Course, PA  03/14/2023, 9:36 AM    Patient seen and examined   I agree with findings as noted above by Harrell Lark   I have amended this note   Pt's rates good on IV diltiazem    Continue heparin  Lungs are CTA Cardiac Irreg irreg   No S3 Ext without edema  Recomm Afib   Continue rate control with dilt   Continue heparin HFrEF   Volume status is OK  Echo pending HTN  Follow with changes as noted above  Dietrich Pates MD

## 2023-03-14 NOTE — Progress Notes (Signed)
ANTICOAGULATION CONSULT NOTE  Pharmacy Consult for heparin Indication: atrial fibrillation   No Known Allergies  Patient Measurements: Height: 5\' 5"  (165.1 cm) Weight: 59.6 kg (131 lb 6.3 oz) IBW/kg (Calculated) : 57 Heparin Dosing Weight: TBW  Vital Signs: Temp: 97.8 F (36.6 C) (08/27 0754) Temp Source: Oral (08/27 0754) BP: 128/98 (08/27 0754) Pulse Rate: 98 (08/27 0754)  Labs: Recent Labs    03/13/23 1030 03/13/23 1235 03/13/23 1439 03/13/23 2226 03/14/23 1008  HGB 14.6  --   --   --  14.0  HCT 45.1  --   --   --  42.8  PLT 181  --   --   --  177  APTT  --   --  37*  --   --   LABPROT 15.1  --   --   --   --   INR 1.2  --   --   --   --   HEPARINUNFRC  --   --   --  0.93* 0.37  CREATININE 0.87  --   --   --  0.96  TROPONINIHS 55* 57*  --   --   --     Estimated Creatinine Clearance: 37.9 mL/min (by C-G formula based on SCr of 0.96 mg/dL).  Assessment: 88 y.o. female with Afib w/  possible LAA thrombus and occlusion of LE artery on heparin. Pharmacy dosing heparin  -heparin level at goal on 900 units/hr, CBC stable -possible vascular procedure today  Goal of Therapy:  Heparin level 0.3-0.7 units/ml Monitor platelets by anticoagulation protocol: Yes   Plan:  Continue heparin 900 units/hr Will follow plans post procedure  Harland German, PharmD Clinical Pharmacist **Pharmacist phone directory can now be found on [email or web address] ([email or web address]).  Listed under [facility].

## 2023-03-14 NOTE — Progress Notes (Addendum)
  Progress Note    03/14/2023 8:03 AM * No surgery found *  Subjective:  no complaints.  Denies rest pain in feet   Vitals:   03/14/23 0040 03/14/23 0754  BP: (!) 153/80 (!) 128/98  Pulse: (!) 115 98  Resp: 18 20  Temp: 98.2 F (36.8 C) 97.8 F (36.6 C)  SpO2: 92% 98%   Physical Exam: Lungs:  non labored Extremities:  motor and sensation intact both feet Neurologic: A&O  CBC    Component Value Date/Time   WBC 7.0 03/13/2023 1030   RBC 4.58 03/13/2023 1030   HGB 14.6 03/13/2023 1030   HCT 45.1 03/13/2023 1030   PLT 181 03/13/2023 1030   MCV 98.5 03/13/2023 1030   MCH 31.9 03/13/2023 1030   MCHC 32.4 03/13/2023 1030   RDW 14.1 03/13/2023 1030   LYMPHSABS 1.2 03/13/2023 1030   MONOABS 0.5 03/13/2023 1030   EOSABS 0.2 03/13/2023 1030   BASOSABS 0.1 03/13/2023 1030    BMET    Component Value Date/Time   NA 133 (L) 03/13/2023 1030   K 3.5 03/13/2023 1030   CL 93 (L) 03/13/2023 1030   CO2 27 03/13/2023 1030   GLUCOSE 140 (H) 03/13/2023 1030   BUN 14 03/13/2023 1030   CREATININE 0.87 03/13/2023 1030   CALCIUM 9.1 03/13/2023 1030   GFRNONAA >60 03/13/2023 1030   GFRAA >60 03/11/2019 0535    INR    Component Value Date/Time   INR 1.2 03/13/2023 1030    No intake or output data in the 24 hours ending 03/14/23 0803   Assessment/Plan:  88 y.o. female with acute occlusion of R SFA and L popliteal artery by CTA  Continues to be asymptomatic; motor and sensation intact both feet Plan is to obtain BLE arterial duplex and ABIs this morning Dr. Randie Heinz will review imaging studies and possible surgical intervention later today   Emilie Rutter, PA-C [facility] [phone] 03/14/2023 8:03 AM   I have independently interviewed and examined patient and agree with PA assessment and plan above.  Pain continues to be improved from admission but she is receiving pain medication and currently on heparin.  ABI from today 0 on the right and 0.5  range on the left.  Concern is for acute on chronic issue.  Plan will be for angiography from left common femoral approach with Dr. Karin Lieu tomorrow in the Cath Lab.  We have discussed the risk benefits alternatives and she demonstrates good understanding.  Darrold Bezek C. Randie Heinz, MD [facility] [place] Office: [phone] Pager: [phone]

## 2023-03-14 NOTE — Assessment & Plan Note (Signed)
Mild, asymptomatic, with leukopenia. 

## 2023-03-14 NOTE — Hospital Course (Addendum)
Brief Narrative:  Sarah Wolf is a 88 y.o. female with past medical history significant for HTN, paroxysmal atrial fibrillation, history of DVT no longer on anticoagulation who presents to [facility] ED on 8/26 with complaint of bilateral lower extremity pain, greatest on right.  Onset morning of admission, pain all the way down her right leg severe nature.  Review of records note patient with left lower extremity DVT 2020 as well as echo noting A-fib at that time.  She was on Xarelto temporarily after being found to have a LLE DVT following a fall with femur fracture 03/08/2019.    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   Significant Hospital events: 8/26 admitted to [facility] 8/28 thrombolysis of RLE with SFA stent, worsening encephalopathy and possible aspiration event leading to worsening hypoxia; pccm consulted and required intubation; Tx to ICU.  8/30: Extubated 9/1: transferred back to hospitalist service; pending SNF placement 9/6: coffee ground emesis, GI consulted  9/7: EGD deferred, dx PNA and started on cefepime  9/8: EGD found large hiatal hernia, severe circumferential esophagitis with ulceration without bleeding.  Suspected long segment of Barrett's esophagus, gastritis, nonbleeding duodenal ulcers.  Has multiple sites of possible bleeding, most likely from esophageal ulceration, duodenal ulcers in setting of anticoagulation.  Continue medical therapy with PPI.  Resume anticoagulation with heparin drip     Assessment & Plan:   Principal Problem:   Critical limb ischemia of right lower extremity (HCC) Active Problems:   Atrial fibrillation with RVR (HCC)   Thrombus of right atrial appendage    Chronic HFpEF   Myocardial injury due to Afib   History of DVT (deep vein thrombosis)   Lesion of mediastinum   Hyponatremia   Acute respiratory failure with hypoxia (HCC)   Goals of care, counseling/discussion   Aspiration into airway   Upper GI bleed   Anticoagulated   Acute esophagitis   Gastritis and gastroduodenitis   Duodenal ulcer     Critical limb ischemia right lower extremity likely secondary to cardioembolic event CT angiogram notable for acute occlusion right SFA and left popliteal artery.  Vascular surgery performed angiogram/aortogram with peripheral thrombectomy with SFA stent.   -Follow-up 4-6 weeks vascular surgery with RLE duplex/ABIs -Aspirin and Eliquis are on hold due to GI bleed --> started IV heparin and will monitor GIB   Upper GI bleed secondary to esophageal and duodenal ulcers Status post endoscopy 9/7 by GI noting esophageal and duodenal ulcers in the setting of anticoagulation.  Now on heparin drip, PPI, Carafate.   Sepsis, not POA, secondary to HCAP Concerns of pneumonia.  Currently on cefepime.  Will check procalcitonin.   Acute respiratory failure with hypoxia Suspect aspiration event Previously completed Unasyn followed by Rocephin.  Now there is concerns of repeat pneumonia?  And started on cefepime.  Bronchodilators, I-S/flutter valve.  Supplemental oxygen.  Procalcitonin pending.  Elevated BNP, IV Lasix ordered   Acute encephalopathy: Resolved EEG, CT head and ammonia levels are negative   Paroxysmal atrial fibrillation with RVR Currently patient is on metoprolol, amiodarone and heparin drip with plans to start Eliquis   Acute on chronic systolic congestive heart failure EF is 45%.  On metoprolol.  One-time IV Lasix   Hx  DVT Currently on heparin   Type 2 diabetes mellitus Hemoglobin A1c 6.6.  Diet controlled.   Urinary retention Resolved   Lesion of mediastinum Noted incidental finding on CT chest of nodule anterior  mediastinum Outpatient CT surgery follow-up   Weakness/debility/deconditioning: PT/OT recommending SNF placement.     Large diaphragmatic hernia Stable follow-up outpatient   Poor perfusion of her upper extremities Vascular Dopplers did not show any obvious obstruction   DVT prophylaxis: Eliquis on hold --> started IV heparin and will monitor GIB   Code Status: DNR, no CPR if pulseless, DNI Family Communication: Nephew at bedside Disposition Plan: SNF, [name]  Status is: Inpatient Remains inpatient appropriate because: Not medically ready for discharge, remains on IV heparin.  If hemoglobin remains stable another day, could consider transition to Eliquis 9/11.  Heart rate is better on amiodarone.  Likely discharge to SNF 1 to 2 days.

## 2023-03-14 NOTE — Assessment & Plan Note (Addendum)
Appears euvolemic, not on diuretics at home.  - Follow echo

## 2023-03-14 NOTE — Assessment & Plan Note (Signed)
Patient has likely embolism in the setting of severe chronic atherosclerosis - Continue heparin gtt - Consult Vascular surgery - Follow up duplex US today  - Plan for angiography tomorrow

## 2023-03-14 NOTE — Progress Notes (Signed)
Dr. Antionette Char paged at [phone]. Patient has no palpable pulse on R foot and it is cold to touch. Unable to find a pulse with doppler. Pt has a known occlusion. Full sensation and movement to extremity.

## 2023-03-14 NOTE — Progress Notes (Signed)
  Progress Note   PatientMurlee Wolf PIR:[id number] DOB: 1934-10-04 DOA: 03/13/2023     1 DOS: the patient was seen and examined on 03/14/2023 at 9:27AM      Brief hospital course: Sarah Wolf is an 88 y.o. F with pAF and DVT not on [facility] who presented with right leg pain for 1 day.  In the ER, found to have Afib with RVR, CTA chest showed possible atrial appendage thrombus and CTA of the right leg showed acute occlusion.  Cardiology and Vascular surgery consulted.  Given her stability and minimal symptoms, Vascular and patient agreed on conservative mgmt for now.  Started on heparin and admitted to hospitalists.     Assessment and Plan: * Critical limb ischemia of right lower extremity (HCC) Patient has likely embolism in the setting of severe chronic atherosclerosis - Continue heparin gtt - Consult Vascular surgery - Follow up duplex US today  - Plan for angiography tomorrow    Thrombus of right atrial appendage    Atrial fibrillation with RVR (HCC) Recurrent.  Had had brief Afib before, was not on AC.  Given the new embolic event, anticoagulation is indicated - Continue heparin drip - Plan for DOAC prior to d/c - Continue diltiazem drip  - Continue Coreg - Consult Cardiology, appreciate expertise - Cardiology want to avoid cardioversion in setting of possible RAA thrombus    Chronic HFpEF Appears euvolemic, not on diuretics at home.  - Follow echo  Myocardial injury due to Afib    Hyponatremia Mild, asymptomatic  Lesion of mediastinum Incidental finding on CT chest.  Nodule in anterior mediastinum - CT surgery referral after discharge  History of DVT (deep vein thrombosis)            Subjective: Patient's leg pain is improved, she now just has pain in the foot which is still cold and pulseless.  She has no fever, no chest pain, no feeling of palpitations, no dyspnea, no swelling.  No orthopnea.  No nursing concerns.     Physical Exam: BP (!)  144/127 (BP Location: Right Arm)   Pulse 94   Temp 97.9 F (36.6 C) (Oral)   Resp 16   Ht 5\' 5"  (1.651 m)   Wt 59.6 kg   SpO2 92%   BMI 21.87 kg/m   Elderly adult female, lying in bed, interactive and appropriate Tachycardic irregular, no murmurs, no peripheral edema, the right foot is cold and pulseless and pale Respiratory rate normal, lungs clear without rales or wheezes Abdomen soft no tenderness palpation Attention normal, affect pleasant, judgment insight appear mildly impaired but at baseline, face symmetric, speech fluent    Data Reviewed: Discussed with vascular surgery Basic metabolic panel shows sodium 133, creatinine normal LFTs unremarkable CBC normal TSH normal LDL 98    Family Communication: Nephew at the bedside    Disposition: Status is: Inpatient The patient was admitted with new onset A-fib RVR, and presumably embolic critical ischemia of the right leg  Cardiology consulted and are managing her atrial fibrillation  Vascular surgery consulted and are managing her ischemia of the leg, likely angiography tomorrow        Author: Alberteen Sam, MD 03/14/2023 2:41 PM  For on call review [email or web address].

## 2023-03-14 NOTE — TOC Initial Note (Signed)
Transition of Care [facility]) - Initial/Assessment Note    Patient Details  Name: Sarah Wolf MRN: [id number] Date of Birth: 22-May-1935  Transition of [facility]) CM/SW Contact:    Gala Lewandowsky, RN Phone Number: 03/14/2023, 11:28 AM  Clinical Narrative:  Patient presented for leg pain- critical limb ischemia. PTA patient was from home alone. Nephew was in the room at the time of the visit and states patient manages in the home as well as she can. He lives close by around the corner and states he takes her to get groceries. Nephew states it is hard to get the patient to any provider appointments. Patient has DME cane in the home. Patient will benefit from PT/OT consult for disposition planning. Case Manager received a consult for Lovenox-benefits check submitted for cost. Case Manager will continue to follow for transition of care needs as the patient progresses.                Expected Discharge Plan: Home w Home Health Services  Patient Goals and CMS Choice Patient states their goals for this hospitalization and ongoing recovery are:: Patient wants to return home   Expected Discharge Plan and Services In-house Referral: NA Discharge Planning Services: CM Consult   Living arrangements for the past 2 months: Single Family Home                   DME Agency: NA  Prior Living Arrangements/Services Living arrangements for the past 2 months: Single Family Home Lives with:: Self (Has support of nephew and his daughter.) Patient language and need for interpreter reviewed:: Yes Do you feel safe going back to the place where you live?: Yes      Need for Family Participation in Patient Care: Yes (Comment) Care giver support system in place?: Yes (comment)   Criminal Activity/Legal Involvement Pertinent to Current Situation/Hospitalization: No - Comment as needed  Activities of Daily Living Home Assistive Devices/Equipment: Cane (specify quad or straight), Walker (specify type) ADL  Screening (condition at time of admission) Patient's cognitive ability adequate to safely complete daily activities?: Yes Is the patient deaf or have difficulty hearing?: No Does the patient have difficulty seeing, even when wearing glasses/contacts?: No Does the patient have difficulty concentrating, remembering, or making decisions?: Yes Patient able to express need for assistance with ADLs?: Yes Does the patient have difficulty dressing or bathing?: Yes Independently performs ADLs?: No Communication: Independent Dressing (OT): Needs assistance Is this a change from baseline?: Change from baseline, expected to last <3days Grooming: Independent Feeding: Independent Bathing: Independent Toileting: Needs assistance Is this a change from baseline?: Change from baseline, expected to last <3 days In/Out Bed: Needs assistance Is this a change from baseline?: Change from baseline, expected to last <3 days Walks in Home: Independent with device (comment) (cane) Does the patient have difficulty walking or climbing stairs?: No Weakness of Legs: Right Weakness of Arms/Hands: None  Permission Sought/Granted Permission sought to share information with : Family Supports, Case Manager   Emotional Assessment Appearance:: Appears stated age Attitude/Demeanor/Rapport: Unable to Assess Affect (typically observed): Unable to Assess   Alcohol / Substance Use: Not Applicable Psych Involvement: No (comment)  Admission diagnosis:  Popliteal artery occlusion, 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] Atrial fibrillation with RVR (HCC) [I48.91] Superficial femoral artery occlusion (HCC) [I70.209] Atrial fibrillation, unspecified type [facility]) [I48.91] Patient Active Problem List   Diagnosis Date Noted   Atrial fibrillation with RVR (HCC) 03/13/2023   Critical limb ischemia of right lower extremity (HCC) 03/13/2023  Thrombus of left atrial appendage 03/13/2023   Myocardial injury due to Afib 03/13/2023   Chronic HFpEF  03/13/2023   History of DVT (deep vein thrombosis) 03/13/2023   Lesion of mediastinum 03/13/2023   Hyponatremia 03/13/2023   Pain due to onychomycosis of toenails of both feet 01/06/2023   Hip fracture (HCC) 03/08/2019   Closed comminuted intra-articular fracture of distal femur, right, initial encounter (HCC)    Closed displaced supracondylar fracture with intracondylar extension of lower end of left femur (HCC)    PCP:  Tally Joe, MD Pharmacy:   [facility] [address] ([address]), [place] - [address] [place] ([place]) [place] [address] Phone: [phone] Fax: [phone]  Social Determinants of Health (SDOH) Social History: SDOH Screenings   Food Insecurity: No Food Insecurity (03/13/2023)  Housing: Medium Risk (03/13/2023)  Transportation Needs: No Transportation Needs (03/13/2023)  Utilities: Not At Risk (03/13/2023)  Tobacco Use: Low Risk  (03/14/2023)   Readmission Risk Interventions     No data to display

## 2023-03-14 NOTE — Assessment & Plan Note (Signed)
Recurrent.  Had had brief Afib before, was not on AC.  Given the new embolic event, anticoagulation is indicated - Continue heparin drip - Plan for DOAC prior to d/c - Continue diltiazem drip  - Continue Coreg - Consult Cardiology, appreciate expertise - Cardiology want to avoid cardioversion in setting of possible RAA thrombus

## 2023-03-14 NOTE — Assessment & Plan Note (Signed)
Incidental finding on CT chest.  Nodule in anterior mediastinum - CT surgery referral after discharge

## 2023-03-14 NOTE — Progress Notes (Signed)
Nutrition Brief Note  Patient identified on the Malnutrition Screening Tool (MST) Report  Wt Readings from Last 15 Encounters:  03/14/23 59.6 kg  03/08/19 63.5 kg    Body mass index is 21.87 kg/m. Patient meets criteria for normal based on current BMI.   Current diet order is heart healthy, patient is consuming unknown percentage % of meals due to lack of documentation. She reports she is eating well and her appetite is adequate. Labs and medications reviewed.   No nutrition interventions warranted at this time. If nutrition issues arise, please consult RD.   Leodis Rains, RDN, LDN  Clinical Nutrition

## 2023-03-14 NOTE — Progress Notes (Signed)
ANTICOAGULATION CONSULT NOTE  Pharmacy Consult for heparin Indication: atrial fibrillation Brief A/P: Heparin level supratherapeutic Decrease Heparin rate  No Known Allergies  Patient Measurements: Height: 5\' 5"  (165.1 cm) Weight: 63.5 kg (140 lb) IBW/kg (Calculated) : 57 Heparin Dosing Weight: TBW  Vital Signs: Temp: 97.6 F (36.4 C) (08/26 2015) Temp Source: Oral (08/26 2015) BP: 132/88 (08/26 2015) Pulse Rate: 98 (08/26 2015)  Labs: Recent Labs    03/13/23 1030 03/13/23 1235 03/13/23 1439 03/13/23 2226  HGB 14.6  --   --   --   HCT 45.1  --   --   --   PLT 181  --   --   --   APTT  --   --  37*  --   LABPROT 15.1  --   --   --   INR 1.2  --   --   --   HEPARINUNFRC  --   --   --  0.93*  CREATININE 0.87  --   --   --   TROPONINIHS 55* 57*  --   --     Estimated Creatinine Clearance: 41.8 mL/min (by C-G formula based on SCr of 0.87 mg/dL).  Assessment: 88 y.o. female with Afib and LAA thrombus for heparin  Goal of Therapy:  Heparin level 0.3-0.7 units/ml Monitor platelets by anticoagulation protocol: Yes   Plan:  Decrease Heparin 900 units/hr Check heparin level in 8 hours.  Geannie Risen, PharmD, BCPS  03/14/2023 12:46 AM

## 2023-03-15 ENCOUNTER — Inpatient Hospital Stay (HOSPITAL_COMMUNITY): Payer: Medicare Other

## 2023-03-15 ENCOUNTER — Encounter (HOSPITAL_COMMUNITY)
Admission: EM | Disposition: A | Discharge status: Skilled Nursing Facility | Payer: Self-pay | Source: Home / Self Care | Attending: Internal Medicine

## 2023-03-15 ENCOUNTER — Other Ambulatory Visit (HOSPITAL_COMMUNITY): Payer: Self-pay

## 2023-03-15 DIAGNOSIS — I82491 Acute embolism and thrombosis of other specified deep vein of right lower extremity: Secondary | ICD-10-CM

## 2023-03-15 DIAGNOSIS — J9601 Acute respiratory failure with hypoxia: Secondary | ICD-10-CM

## 2023-03-15 DIAGNOSIS — Z7189 Other specified counseling: Secondary | ICD-10-CM

## 2023-03-15 DIAGNOSIS — I70221 Atherosclerosis of native arteries of extremities with rest pain, right leg: Secondary | ICD-10-CM | POA: Diagnosis not present

## 2023-03-15 HISTORY — PX: ABDOMINAL AORTOGRAM W/LOWER EXTREMITY: CATH118223

## 2023-03-15 LAB — POCT I-STAT 7, (LYTES, BLD GAS, ICA,H+H)
Acid-Base Excess: 1 mmol/L (ref 0.0–2.0)
Bicarbonate: 27.8 mmol/L (ref 20.0–28.0)
Calcium, Ion: 1.18 mmol/L (ref 1.15–1.40)
HCT: 39 % (ref 36.0–46.0)
Hemoglobin: 13.3 g/dL (ref 12.0–15.0)
O2 Saturation: 100 %
Patient temperature: 36
Potassium: 3.7 mmol/L (ref 3.5–5.1)
Sodium: 136 mmol/L (ref 135–145)
TCO2: 29 mmol/L (ref 22–32)
pCO2 arterial: 49.5 mmHg — ABNORMAL HIGH (ref 32–48)
pH, Arterial: 7.354 (ref 7.35–7.45)
pO2, Arterial: 308 mmHg — ABNORMAL HIGH (ref 83–108)

## 2023-03-15 LAB — CBC
HCT: 37.7 % (ref 36.0–46.0)
HCT: 39.5 % (ref 36.0–46.0)
Hemoglobin: 12.1 g/dL (ref 12.0–15.0)
Hemoglobin: 12.7 g/dL (ref 12.0–15.0)
MCH: 30.9 pg (ref 26.0–34.0)
MCH: 31.8 pg (ref 26.0–34.0)
MCHC: 32.1 g/dL (ref 30.0–36.0)
MCHC: 32.2 g/dL (ref 30.0–36.0)
MCV: 96.2 fL (ref 80.0–100.0)
MCV: 99 fL (ref 80.0–100.0)
Platelets: 148 10*3/uL — ABNORMAL LOW (ref 150–400)
Platelets: 153 10*3/uL (ref 150–400)
RBC: 3.92 MIL/uL (ref 3.87–5.11)
RBC: 3.99 MIL/uL (ref 3.87–5.11)
RDW: 14 % (ref 11.5–15.5)
RDW: 14.1 % (ref 11.5–15.5)
WBC: 10.8 10*3/uL — ABNORMAL HIGH (ref 4.0–10.5)
WBC: 13 10*3/uL — ABNORMAL HIGH (ref 4.0–10.5)
nRBC: 0 % (ref 0.0–0.2)
nRBC: 0 % (ref 0.0–0.2)

## 2023-03-15 LAB — COMPREHENSIVE METABOLIC PANEL
ALT: 22 U/L (ref 0–44)
AST: 27 U/L (ref 15–41)
Albumin: 2.7 g/dL — ABNORMAL LOW (ref 3.5–5.0)
Alkaline Phosphatase: 44 U/L (ref 38–126)
Anion gap: 9 (ref 5–15)
BUN: 16 mg/dL (ref 8–23)
CO2: 28 mmol/L (ref 22–32)
Calcium: 8.4 mg/dL — ABNORMAL LOW (ref 8.9–10.3)
Chloride: 98 mmol/L (ref 98–111)
Creatinine, Ser: 0.74 mg/dL (ref 0.44–1.00)
GFR, Estimated: >60 mL/min (ref >60)
Glucose, Bld: 102 mg/dL — ABNORMAL HIGH (ref 70–99)
Potassium: 3.2 mmol/L — ABNORMAL LOW (ref 3.5–5.1)
Sodium: 135 mmol/L (ref 135–145)
Total Bilirubin: 1.4 mg/dL — ABNORMAL HIGH (ref 0.3–1.2)
Total Protein: 5.3 g/dL — ABNORMAL LOW (ref 6.5–8.1)

## 2023-03-15 LAB — FIBRINOGEN: Fibrinogen: 306 mg/dL (ref 210–475)

## 2023-03-15 LAB — HEMOGLOBIN A1C
Hgb A1c MFr Bld: 6.6 % — ABNORMAL HIGH (ref 4.8–5.6)
Mean Plasma Glucose: 142.72 mg/dL

## 2023-03-15 LAB — HEPARIN LEVEL (UNFRACTIONATED)
Heparin Unfractionated: 0.13 [IU]/mL — ABNORMAL LOW (ref 0.30–0.70)
Heparin Unfractionated: <0.1 [IU]/mL — ABNORMAL LOW (ref 0.30–0.70)

## 2023-03-15 SURGERY — ABDOMINAL AORTOGRAM W/LOWER EXTREMITY
Anesthesia: LOCAL

## 2023-03-15 MED ORDER — KETAMINE HCL 50 MG/5ML IJ SOSY
PREFILLED_SYRINGE | INTRAMUSCULAR | Status: AC
  Filled 2023-03-15: qty 10

## 2023-03-15 MED ORDER — MIDAZOLAM HCL 2 MG/2ML IJ SOLN
INTRAMUSCULAR | Status: AC
  Filled 2023-03-15: qty 2

## 2023-03-15 MED ORDER — LIDOCAINE HCL (PF) 1 % IJ SOLN
INTRAMUSCULAR | Status: DC | PRN
  Administered 2023-03-15: 15 mL

## 2023-03-15 MED ORDER — POTASSIUM CHLORIDE CRYS ER 10 MEQ PO TBCR
30.0000 meq | EXTENDED_RELEASE_TABLET | ORAL | Status: AC
  Administered 2023-03-15 (×2): 30 meq via ORAL
  Filled 2023-03-15 (×2): qty 3

## 2023-03-15 MED ORDER — DOCUSATE SODIUM 50 MG/5ML PO LIQD
100.0000 mg | Freq: Two times a day (BID) | ORAL | Status: DC
  Administered 2023-03-16 – 2023-03-17 (×2): 100 mg
  Filled 2023-03-15 (×2): qty 10

## 2023-03-15 MED ORDER — METOPROLOL TARTRATE 25 MG PO TABS
25.0000 mg | ORAL_TABLET | Freq: Two times a day (BID) | ORAL | Status: DC
  Administered 2023-03-15 (×2): 25 mg via ORAL
  Filled 2023-03-15 (×2): qty 1

## 2023-03-15 MED ORDER — PHENYLEPHRINE 80 MCG/ML (10ML) SYRINGE FOR IV PUSH (FOR BLOOD PRESSURE SUPPORT): 40.0000 ug | PREFILLED_SYRINGE | Freq: Once | INTRAVENOUS | Status: AC | PRN

## 2023-03-15 MED ORDER — POLYETHYLENE GLYCOL 3350 17 G PO PACK: 17.0000 g | PACK | Freq: Every day | ORAL | Status: DC

## 2023-03-15 MED ORDER — PHENYLEPHRINE 80 MCG/ML (10ML) SYRINGE FOR IV PUSH (FOR BLOOD PRESSURE SUPPORT)
PREFILLED_SYRINGE | INTRAVENOUS | Status: AC
  Administered 2023-03-15: 40 ug via INTRAVENOUS
  Filled 2023-03-15: qty 10

## 2023-03-15 MED ORDER — PHENYLEPHRINE HCL-NACL 20-0.9 MG/250ML-% IV SOLN
INTRAVENOUS | Status: AC
  Filled 2023-03-15: qty 250

## 2023-03-15 MED ORDER — LABETALOL HCL 5 MG/ML IV SOLN
10.0000 mg | INTRAVENOUS | Status: DC | PRN
  Administered 2023-03-18 (×3): 10 mg via INTRAVENOUS
  Filled 2023-03-15 (×2): qty 4

## 2023-03-15 MED ORDER — ORAL CARE MOUTH RINSE: 15.0000 mL | OROMUCOSAL | Status: DC | PRN

## 2023-03-15 MED ORDER — POTASSIUM CHLORIDE CRYS ER 20 MEQ PO TBCR: 30.0000 meq | EXTENDED_RELEASE_TABLET | ORAL | Status: DC

## 2023-03-15 MED ORDER — MIDAZOLAM HCL 2 MG/2ML IJ SOLN
INTRAMUSCULAR | Status: DC | PRN
  Administered 2023-03-15: .5 mg via INTRAVENOUS

## 2023-03-15 MED ORDER — LIDOCAINE HCL (PF) 1 % IJ SOLN
INTRAMUSCULAR | Status: AC
  Filled 2023-03-15: qty 30

## 2023-03-15 MED ORDER — ONDANSETRON HCL 4 MG/2ML IJ SOLN
4.0000 mg | Freq: Four times a day (QID) | INTRAMUSCULAR | Status: DC | PRN
  Administered 2023-03-15 – 2023-03-22 (×4): 4 mg via INTRAVENOUS
  Filled 2023-03-15 (×3): qty 2

## 2023-03-15 MED ORDER — NOREPINEPHRINE 4 MG/250ML-% IV SOLN
INTRAVENOUS | Status: AC
  Administered 2023-03-15: 4 mg
  Filled 2023-03-15: qty 250

## 2023-03-15 MED ORDER — HEPARIN (PORCINE) IN NACL 1000-0.9 UT/500ML-% IV SOLN
INTRAVENOUS | Status: DC | PRN
  Administered 2023-03-15 (×2): 500 mL

## 2023-03-15 MED ORDER — HEPARIN SODIUM (PORCINE) 1000 UNIT/ML IJ SOLN
INTRAMUSCULAR | Status: AC
  Filled 2023-03-15: qty 10

## 2023-03-15 MED ORDER — INSULIN ASPART 100 UNIT/ML IJ SOLN
0.0000 [IU] | INTRAMUSCULAR | Status: DC
  Administered 2023-03-17: 1 [IU] via SUBCUTANEOUS

## 2023-03-15 MED ORDER — HEPARIN SODIUM (PORCINE) 1000 UNIT/ML IJ SOLN
INTRAMUSCULAR | Status: DC | PRN
  Administered 2023-03-15: 3000 [IU] via INTRAVENOUS

## 2023-03-15 MED ORDER — FENTANYL CITRATE (PF) 100 MCG/2ML IJ SOLN
INTRAMUSCULAR | Status: DC | PRN
  Administered 2023-03-15: 25 ug via INTRAVENOUS

## 2023-03-15 MED ORDER — MORPHINE SULFATE (PF) 2 MG/ML IV SOLN
5.0000 mg | INTRAVENOUS | Status: DC | PRN
  Administered 2023-03-15 (×2): 5 mg via INTRAVENOUS
  Filled 2023-03-15 (×2): qty 3

## 2023-03-15 MED ORDER — FENTANYL CITRATE PF 50 MCG/ML IJ SOSY: 25.0000 ug | PREFILLED_SYRINGE | INTRAMUSCULAR | Status: DC | PRN

## 2023-03-15 MED ORDER — PANTOPRAZOLE SODIUM 40 MG IV SOLR
40.0000 mg | Freq: Every day | INTRAVENOUS | Status: DC
  Administered 2023-03-16: 40 mg via INTRAVENOUS
  Filled 2023-03-15: qty 10

## 2023-03-15 MED ORDER — CHLORHEXIDINE GLUCONATE CLOTH 2 % EX PADS
6.0000 | MEDICATED_PAD | Freq: Every day | CUTANEOUS | Status: DC
  Administered 2023-03-15 – 2023-03-30 (×14): 6 via TOPICAL

## 2023-03-15 MED ORDER — ETOMIDATE 2 MG/ML IV SOLN: 20.0000 mg | Freq: Once | INTRAVENOUS | Status: AC

## 2023-03-15 MED ORDER — HEPARIN (PORCINE) 25000 UT/250ML-% IV SOLN
1050.0000 [IU]/h | INTRAVENOUS | Status: DC
  Administered 2023-03-15: 800 [IU]/h via INTRAVENOUS
  Administered 2023-03-16: 1000 [IU]/h via INTRAVENOUS
  Filled 2023-03-15 (×2): qty 250

## 2023-03-15 MED ORDER — HYDRALAZINE HCL 20 MG/ML IJ SOLN
5.0000 mg | INTRAMUSCULAR | Status: DC | PRN
  Administered 2023-03-24: 5 mg via INTRAVENOUS
  Filled 2023-03-15: qty 1

## 2023-03-15 MED ORDER — IODIXANOL 320 MG/ML IV SOLN
INTRAVENOUS | Status: DC | PRN
  Administered 2023-03-15: 60 mL

## 2023-03-15 MED ORDER — NOREPINEPHRINE 4 MG/250ML-% IV SOLN: 2.0000 ug/min | INTRAVENOUS | Status: DC

## 2023-03-15 MED ORDER — SUCCINYLCHOLINE CHLORIDE 200 MG/10ML IV SOSY
PREFILLED_SYRINGE | INTRAVENOUS | Status: AC
  Filled 2023-03-15: qty 10

## 2023-03-15 MED ORDER — PNEUMOCOCCAL 20-VAL CONJ VACC 0.5 ML IM SUSY
0.5000 mL | PREFILLED_SYRINGE | INTRAMUSCULAR | Status: DC
  Filled 2023-03-15: qty 0.5

## 2023-03-15 MED ORDER — SODIUM CHLORIDE 0.9 % IV SOLN
250.0000 mL | INTRAVENOUS | Status: DC
  Administered 2023-03-16: 250 mL via INTRAVENOUS

## 2023-03-15 MED ORDER — MIDAZOLAM HCL 2 MG/2ML IJ SOLN
1.0000 mg | INTRAMUSCULAR | Status: DC | PRN
  Administered 2023-03-15: 1 mg via INTRAVENOUS
  Filled 2023-03-15: qty 2

## 2023-03-15 MED ORDER — FAMOTIDINE 20 MG PO TABS: 20.0000 mg | ORAL_TABLET | Freq: Two times a day (BID) | ORAL | Status: DC

## 2023-03-15 MED ORDER — LOSARTAN POTASSIUM 25 MG PO TABS: 25.0000 mg | ORAL_TABLET | Freq: Every day | ORAL | Status: DC

## 2023-03-15 MED ORDER — PHENYLEPHRINE 80 MCG/ML (10ML) SYRINGE FOR IV PUSH (FOR BLOOD PRESSURE SUPPORT)
40.0000 ug | PREFILLED_SYRINGE | Freq: Once | INTRAVENOUS | Status: AC | PRN
  Administered 2023-03-15: 40 ug via INTRAVENOUS

## 2023-03-15 MED ORDER — SODIUM CHLORIDE 0.9% FLUSH: 3.0000 mL | INTRAVENOUS | Status: DC | PRN

## 2023-03-15 MED ORDER — SODIUM CHLORIDE 0.9 % IV SOLN
1.0000 mg/h | INTRAVENOUS | Status: DC
  Administered 2023-03-15 – 2023-03-16 (×3): 1 mg/h
  Filled 2023-03-15 (×7): qty 10

## 2023-03-15 MED ORDER — ROCURONIUM BROMIDE 10 MG/ML (PF) SYRINGE
PREFILLED_SYRINGE | INTRAVENOUS | Status: AC
  Filled 2023-03-15: qty 10

## 2023-03-15 MED ORDER — SODIUM CHLORIDE 0.9 % IV SOLN: 250.0000 mL | INTRAVENOUS | Status: DC | PRN

## 2023-03-15 MED ORDER — SODIUM CHLORIDE 0.9 % IV SOLN
3.0000 g | Freq: Four times a day (QID) | INTRAVENOUS | Status: DC
  Administered 2023-03-16 – 2023-03-18 (×9): 3 g via INTRAVENOUS
  Filled 2023-03-15 (×9): qty 8

## 2023-03-15 MED ORDER — FENTANYL CITRATE PF 50 MCG/ML IJ SOSY: 100.0000 ug | PREFILLED_SYRINGE | Freq: Once | INTRAMUSCULAR | Status: AC

## 2023-03-15 MED ORDER — FENTANYL CITRATE (PF) 100 MCG/2ML IJ SOLN
INTRAMUSCULAR | Status: AC
  Filled 2023-03-15: qty 2

## 2023-03-15 MED ORDER — ETOMIDATE 2 MG/ML IV SOLN
INTRAVENOUS | Status: AC
  Administered 2023-03-15: 20 mg via INTRAVENOUS
  Filled 2023-03-15: qty 20

## 2023-03-15 MED ORDER — FENTANYL CITRATE PF 50 MCG/ML IJ SOSY
25.0000 ug | PREFILLED_SYRINGE | INTRAMUSCULAR | Status: DC | PRN
  Administered 2023-03-16: 100 ug via INTRAVENOUS
  Administered 2023-03-16: 50 ug via INTRAVENOUS
  Administered 2023-03-16 – 2023-03-17 (×2): 25 ug via INTRAVENOUS
  Filled 2023-03-15: qty 1
  Filled 2023-03-15: qty 2
  Filled 2023-03-15 (×4): qty 1
  Filled 2023-03-15: qty 2

## 2023-03-15 MED ORDER — FENTANYL CITRATE PF 50 MCG/ML IJ SOSY
PREFILLED_SYRINGE | INTRAMUSCULAR | Status: AC
  Administered 2023-03-15: 100 ug via INTRAVENOUS
  Filled 2023-03-15: qty 2

## 2023-03-15 MED ORDER — SODIUM CHLORIDE 0.9% FLUSH
3.0000 mL | Freq: Two times a day (BID) | INTRAVENOUS | Status: DC
  Administered 2023-03-16 – 2023-03-19 (×8): 3 mL via INTRAVENOUS

## 2023-03-15 MED ORDER — SODIUM CHLORIDE 0.9 % IV SOLN: INTRAVENOUS | Status: DC

## 2023-03-15 SURGICAL SUPPLY — 9 items
CATH INFUS 135CMX50CM (CATHETERS) IMPLANT
CATH OMNI FLUSH 5F 65CM (CATHETERS) IMPLANT
GLIDEWIRE ADV .035X260CM (WIRE) IMPLANT
KIT MICROPUNCTURE NIT STIFF (SHEATH) IMPLANT
SET ATX-X65L (MISCELLANEOUS) IMPLANT
SHEATH CATAPULT 6FR 45 (SHEATH) IMPLANT
SHEATH PINNACLE 5F 10CM (SHEATH) IMPLANT
TRAY PV CATH (CUSTOM PROCEDURE TRAY) ×1 IMPLANT
WIRE BENTSON .035X145CM (WIRE) IMPLANT

## 2023-03-15 NOTE — Progress Notes (Signed)
eLink Physician-Brief Progress Note Patient Name: Allanna Arroliga DOB: December 24, 1934 MRN: [id number]   Date of Service  03/15/2023  HPI/Events of Note  88 year old female with a history of atrial fibrillation that initially presented with cardioembolic critical limb ischemia of the right lower extremity.  Underwent thrombolysis.  Tonight, she developed progressive respiratory distress and hypoxemia and ultimately became quite agitated.  Received 2 mg of Versed and had persistent hypoxemia into the 80s.  eICU Interventions  Alerted the ground team for further evaluation.  Spoke with patient's nephew Valrie Hart and alerted him that events were escalating him that she may need to be placed on a ventilator.  They were amenable with proceeding.  Preoxygenated with nonrebreather and salter cannula  Ground team at bedside to evaluate.   0004 -follow-up radiograph reviewed with endotracheal tube in appropriate position.  Follow-up ABG with appropriate oxygenation and ventilation.  FiO2 titrated to 40%.  Patient is starting to wake up with only as needed fentanyl available.  Getting ready to go down for CT head.  Will add on Precedex infusion and as needed Versed to maintain current RASS goal  0220 -CT head reviewed, no acute changes.  Intervention Category Major Interventions: Respiratory failure - evaluation and management  Mohanad Carsten 03/15/2023, 9:50 PM

## 2023-03-15 NOTE — Op Note (Signed)
Patient name: Sarah Wolf MRN: [id number] DOB: [birth year] Sex: female  03/15/2023 Pre-operative Diagnosis: [place] 1 acute limb ischemia in the right leg Post-operative diagnosis:  Same Surgeon:  Victorino Sparrow, MD Procedure Performed: 1.  Ultrasound-guided micropuncture access of the left common femoral artery 2.  Aortogram 3.  Second-order cannulation, right lower extremity angiogram 4.  Third cannulation, right lower extremity angiogram 5.  Initiation of thrombolysis therapy through a 50 cm UniFuse catheter spanning from the common femoral artery through the popliteal artery. 6.  Moderate sedation time 33 minutes, contrast volume 60 mL   Indications: Patient is an 88 year old female with recent diagnosis of atrial fibrillation who presented with bilateral lower extremity pain which abated with heparinization.  CTA demonstrated occluded superficial femoral artery in the right lower extremity with significant atherosclerotic disease.  ABI demonstrated no toe pressure in the right lower extremity.  Right foot pain returned.  Discussed right lower extremity angiogram with possible intervention in an effort to improve distal perfusion to alleviate rest pain.  Findings:  Aortogram: Bilateral renal arteries patent.  No flow-limiting stenosis in the aortoiliac segments bilaterally On the right: Widely patent common femoral artery, widely patent profunda with a branch occluded from thrombus.  Superficial femoral artery is patent for 4 cm prior to occlusion.  There is reconstitution of the mid SFA continuing into the P1 segment of the popliteal artery.  The P2 P3 segment of the popliteal artery are occluded.  The ostia of the anterior tibial artery is occluded with reconstitution of collaterals at the mid tibia.  No outflow into the dorsalis pedis.  The peroneal artery appears to reconstitute via collaterals with no outflow, and the posterior tibial artery appears to continue to the foot with  minimal outflow in the plantar arteries.  The plantar arteries appear to be trashed from embolic debris.    Procedure:  The patient was identified in the holding area and taken to room 8.  The patient was then placed supine on the table and prepped and draped in the usual sterile fashion.  A time out was called.  Ultrasound was used to evaluate the left common femoral artery.  It was patent .  A digital ultrasound image was acquired.  A micropuncture needle was used to access the right common femoral artery under ultrasound guidance.  An 018 wire was advanced without resistance and a micropuncture sheath was placed.  The 018 wire was removed and a benson wire was placed.  The micropuncture sheath was exchanged for a 5 french sheath.  An omniflush catheter was advanced over the wire to the level of L-1.  An abdominal angiogram was obtained.  Next, using the omniflush catheter and a benson wire, the aortic bifurcation was crossed and the catheter was placed into theright external iliac artery and right runoff was obtained.  See results above.  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  Being that this cardioembolic event was recent, I felt Crista would be best treated with thrombolytic therapy.  A 50 cm UniFuse catheter was brought onto the field and parked in the distal portion of the P3 segment of the popliteal artery.  Angiography followed from the distal aspect demonstrating single-vessel posterior tibial outflow to the level of the ankle with no flow in the foot.  I elected to proceed with initiation of thrombolysis therapy.   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  This  pathology has a high rate of limb loss.  Being that this cardioembolic event was recent, and [name] has no history of recent fall, bleed, surgery, brain malignancy, [name] would be best treated with thrombolytic therapy.  This was  initiated at 1 mg/h    Fara Olden, MD [facility]: [phone]

## 2023-03-15 NOTE — Progress Notes (Addendum)
Rounding Note    Patient Name: Sarah Wolf Date of Encounter: 03/15/2023  [facility] Cardiologist: Dietrich Pates, MD   Subjective   No acute overnight events. No chest pain or shortness of breath. She denies any palpitations and is not aware of being in atrial fibrillation. However, family does states she has been more fatigued for the last couple of months so wonder if that could be due to atrial fibrillation. Her main complaints this morning are right leg pain (due to her critical limb ischemia) and a hoarse voice/ cough. She is scheduled to have PV angiography later today.  Inpatient Medications    Scheduled Meds:  carvedilol  3.125 mg Oral BID WC   feeding supplement  237 mL Oral BID BM   sodium chloride flush  3 mL Intravenous Q12H   Continuous Infusions:  sodium chloride 100 mL/hr at 03/15/23 0617   diltiazem (CARDIZEM) infusion 7.5 mg/hr (03/14/23 2320)   heparin 900 Units/hr (03/14/23 1340)   PRN Meds: acetaminophen **OR** acetaminophen, albuterol, HYDROcodone-acetaminophen   Vital Signs    Vitals:   03/14/23 1730 03/14/23 1954 03/15/23 0500 03/15/23 0622  BP: (!) 144/87 138/77  118/80  Pulse: 98 (!) 102 94 (!) 105  Resp:  17 11 20   Temp:  98.5 F (36.9 C)  97.8 F (36.6 C)  TempSrc:    Oral  SpO2:  91% (!) 88% 94%  Weight:    57.5 kg  Height:        Intake/Output Summary (Last 24 hours) at 03/15/2023 0724 Last data filed at 03/14/2023 1150 Gross per 24 hour  Intake 443.17 ml  Output 400 ml  Net 43.17 ml      03/15/2023    6:22 AM 03/14/2023    5:00 AM 03/13/2023   10:24 AM  Last 3 Weights  Weight (lbs) 126 lb 12.8 oz 131 lb 6.3 oz 140 lb  Weight (kg) 57.516 kg 59.6 kg 63.504 kg      Telemetry    Atrial fibrillation with rates ranging from the 80s to 110s (currently in the 90s to low 100s). Occasional PVCs. - Personally Reviewed  ECG    No new ECG tracing today. - Personally Reviewed  Physical Exam   GEN: Thin Caucasian female  resting comfortably in no acute distress.   Neck: No JVD. Cardiac: Irregularly irregular rhythm with normal rate. No murmurs, rubs, or gallops.  Respiratory: Clear to auscultation bilaterally. No wheezes, rhonchi, or rales.  MS: No lower extremity edema. Right lower extremity cool to the touch. Neuro:  No focal deficits. Psych: Normal affect. Responds appropriately.  Labs    High Sensitivity Troponin:   Recent Labs  Lab 03/13/23 1030 03/13/23 1235  TROPONINIHS 55* 57*     Chemistry Recent Labs  Lab 03/13/23 1030 03/13/23 1559 03/14/23 1008  NA 133*  --  133*  K 3.5  --  3.9  CL 93*  --  94*  CO2 27  --  26  GLUCOSE 140*  --  125*  BUN 14  --  22  CREATININE 0.87  --  0.96  CALCIUM 9.1  --  8.8*  MG  --  2.2  --   PROT 7.0  --   --   ALBUMIN 3.6  --   --   AST 28  --   --   ALT 19  --   --   ALKPHOS 60  --   --   BILITOT 1.3*  --   --  GFRNONAA >60  --  58*  ANIONGAP 13  --  13    Lipids  Recent Labs  Lab 03/13/23 1559  CHOL 153  TRIG 40  HDL 47  LDLCALC 98  CHOLHDL 3.3    Hematology Recent Labs  Lab 03/13/23 1030 03/14/23 1008  WBC 7.0 10.1  RBC 4.58 4.50  HGB 14.6 14.0  HCT 45.1 42.8  MCV 98.5 95.1  MCH 31.9 31.1  MCHC 32.4 32.7  RDW 14.1 14.1  PLT 181 177   Thyroid  Recent Labs  Lab 03/13/23 1030  TSH 2.940    BNP Recent Labs  Lab 03/13/23 1030  BNP 799.9*    DDimer No results for input(s): "DDIMER" in the last 168 hours.   Radiology    VAS [place] ABI WITH/WO TBI  Result Date: 03/14/2023  LOWER EXTREMITY DOPPLER STUDY Patient Name:  Sarah Wolf  Date of Exam:   03/14/2023 Medical Rec #: [id number]   Accession #:    [id number] Date of Birth: 18-Feb-1935   Patient Gender: F Patient Age:   88 years Exam Location:  [facility] Procedure:      VAS [place] ABI WITH/WO TBI Referring Phys: Lemar Livings --------------------------------------------------------------------------------  Indications: SFA occlusion found incidentally on venous  examination for right              leg pain yesterday. Other Factors: Atrial fibrillation.  Vascular Interventions: No prior interventions. Limited prior history. Provoked                         right peroneal DVT s/p surgery in 2020. Limitations: Today's exam was limited due to involuntary patient movement. Comparison Study: CTA aorta bifemoral performed yesterday for finding of SFA                   occlusion on venous ultrasound.                   "Occlusion right superficial femoral artery beginning several                   cm beyond the origin in extending into the distal thigh.                   Complete occlusion of the left popliteal artery beginning at                   the P3 segment and extending through the trifurcation." Performing Technologist: Jean Rosenthal RDMS RVT  Examination Guidelines: A complete evaluation includes at minimum, Doppler waveform signals and systolic blood pressure reading at the level of bilateral brachial, anterior tibial, and posterior tibial arteries, when vessel segments are accessible. Bilateral testing is considered an integral part of a complete examination. Photoelectric Plethysmograph (PPG) waveforms and toe systolic pressure readings are included as required and additional duplex testing as needed. Limited examinations for reoccurring indications may be performed as noted.  ABI Findings: +---------+------------------+-----+---------+--------+ Right    Rt Pressure (mmHg)IndexWaveform Comment  +---------+------------------+-----+---------+--------+ Brachial 137                    triphasic         +---------+------------------+-----+---------+--------+ DP       0                 0.00 absent            +---------+------------------+-----+---------+--------+ Oda Cogan  0.00 Absent            +---------+------------------+-----+---------+--------+ +---------+------------------+-----+----------+-------+ Left     Lt Pressure  (mmHg)IndexWaveform  Comment +---------+------------------+-----+----------+-------+ Brachial 133                    triphasic         +---------+------------------+-----+----------+-------+ PTA      77                0.56 monophasic        +---------+------------------+-----+----------+-------+ DP       69                0.50 monophasic        +---------+------------------+-----+----------+-------+ Great Toe21                0.15 Abnormal          +---------+------------------+-----+----------+-------+  Summary: Right: Resting right ankle-brachial index indicates critical limb ischemia. Left: Resting left ankle-brachial index indicates moderate left lower extremity arterial disease. The left toe-brachial index is abnormal. *See table(s) above for measurements and observations.  Electronically signed by Waverly Ferrari MD on 03/14/2023 at 3:05:09 PM.    Final    VAS [place] LOWER EXTREMITY ARTERIAL DUPLEX  Result Date: 03/14/2023 LOWER EXTREMITY ARTERIAL DUPLEX STUDY Patient Name:  Sarah Wolf  Date of Exam:   03/14/2023 Medical Rec #: [id number]   Accession #:    [id number] Date of Birth: July 12, 1935   Patient Gender: F Patient Age:   56 years Exam Location:  [facility] Procedure:      VAS [place] LOWER EXTREMITY ARTERIAL DUPLEX Referring Phys: Lemar Livings --------------------------------------------------------------------------------  Indications: SFA occlusion found incidentally on venous examination for right              leg pain yesterday. Other Factors: Atrial fibrillation.  Vascular Interventions: No prior interventions. Limited prior history. Provoked                         right peroneal DVT s/p surgery in 2020. Current ABI:            RT 0.0/0.0, LT 0.56/0.15 Limitations: Involuntary patient movement. Comparison Study: CTA aorta bifemoral performed yesterday for finding of SFA                   occlusion.                   "Occlusion right superficial femoral artery  beginning several                   cm beyond the origin in extending into the distal thigh.                   Complete occlusion of the left popliteal artery beginning at                   the P3 segment and extending through the trifurcation." Performing Technologist: Jean Rosenthal RDMS, RVT  Examination Guidelines: A complete evaluation includes B-mode imaging, spectral Doppler, color Doppler, and power Doppler as needed of all accessible portions of each vessel. Bilateral testing is considered an integral part of a complete examination. Limited examinations for reoccurring indications may be performed as noted.  +-----------+--------+-----+--------+-------------------+----------------------+ RIGHT      PSV cm/sRatioStenosisWaveform           Comments               +-----------+--------+-----+--------+-------------------+----------------------+  CFA Prox   78                   triphasic                                 +-----------+--------+-----+--------+-------------------+----------------------+ CFA Mid    70                   triphasic                                 +-----------+--------+-----+--------+-------------------+----------------------+ CFA Distal 29                   biphasic                                  +-----------+--------+-----+--------+-------------------+----------------------+ DFA        102                  triphasic                                 +-----------+--------+-----+--------+-------------------+----------------------+ SFA Prox   13                   monophasic         pre-occlusive          +-----------+--------+-----+--------+-------------------+----------------------+ SFA Mid                 occluded                                          +-----------+--------+-----+--------+-------------------+----------------------+ SFA Distal 24                   monophasic         reconstituted via                                                          collateral             +-----------+--------+-----+--------+-------------------+----------------------+ POP Prox                                           Unable to obtain                                                          signal- venous  interference and                                                          patient movement       +-----------+--------+-----+--------+-------------------+----------------------+ POP Mid    14                   monophasic                                +-----------+--------+-----+--------+-------------------+----------------------+ POP Distal 7                    dampened monophasic                       +-----------+--------+-----+--------+-------------------+----------------------+ ATA Prox   8                    dampened monophasic                       +-----------+--------+-----+--------+-------------------+----------------------+ ATA Mid    6                    dampened monophasic                       +-----------+--------+-----+--------+-------------------+----------------------+ ATA Distal                                         Unable to obtain                                                          signal                 +-----------+--------+-----+--------+-------------------+----------------------+ PTA Prox   17                   dampened monophasic                       +-----------+--------+-----+--------+-------------------+----------------------+ PTA Mid    6                    dampened monophasic                       +-----------+--------+-----+--------+-------------------+----------------------+ PTA Distal 4                    dampened monophasic                       +-----------+--------+-----+--------+-------------------+----------------------+ PERO Prox                                           Unable to obtain  signal                 +-----------+--------+-----+--------+-------------------+----------------------+ PERO Mid                                           Unable to obtain                                                          signal                 +-----------+--------+-----+--------+-------------------+----------------------+ PERO Distal                                        Unable to obtain                                                          signal                 +-----------+--------+-----+--------+-------------------+----------------------+ DP                                                 Unable to obtain                                                          signal                 +-----------+--------+-----+--------+-------------------+----------------------+  +-----------+--------+-----+--------+-------------------+----------------------+ LEFT       PSV cm/sRatioStenosisWaveform           Comments               +-----------+--------+-----+--------+-------------------+----------------------+ CFA Prox   104                  triphasic                                 +-----------+--------+-----+--------+-------------------+----------------------+ CFA Mid    82                   triphasic                                 +-----------+--------+-----+--------+-------------------+----------------------+ CFA Distal 76                   triphasic                                 +-----------+--------+-----+--------+-------------------+----------------------+ DFA  52                   triphasic                                 +-----------+--------+-----+--------+-------------------+----------------------+ SFA Prox   60                   triphasic                                  +-----------+--------+-----+--------+-------------------+----------------------+ SFA Mid    69                   triphasic                                 +-----------+--------+-----+--------+-------------------+----------------------+ SFA Distal 102                  triphasic                                 +-----------+--------+-----+--------+-------------------+----------------------+ POP Prox   71                   triphasic                                 +-----------+--------+-----+--------+-------------------+----------------------+ POP Mid    8                    monophasic         Pre-occlusive          +-----------+--------+-----+--------+-------------------+----------------------+ POP Distal              occluded                                          +-----------+--------+-----+--------+-------------------+----------------------+ ATA Prox   15                   monophasic                                +-----------+--------+-----+--------+-------------------+----------------------+ ATA Mid    9                    monophasic                                +-----------+--------+-----+--------+-------------------+----------------------+ ATA Distal 12                   monophasic                                +-----------+--------+-----+--------+-------------------+----------------------+ PTA Prox                occluded                   Reconstituted via  collateral             +-----------+--------+-----+--------+-------------------+----------------------+ PTA Mid    21                   monophasic                                +-----------+--------+-----+--------+-------------------+----------------------+ PTA Distal 19                   monophasic                                +-----------+--------+-----+--------+-------------------+----------------------+ PERO  Prox                                          Unable to obtain                                                          signal                 +-----------+--------+-----+--------+-------------------+----------------------+ PERO Mid   9                    dampened monophasic                       +-----------+--------+-----+--------+-------------------+----------------------+ PERO Distal5                    dampened monophasic                       +-----------+--------+-----+--------+-------------------+----------------------+ DP         12                   monophasic                                +-----------+--------+-----+--------+-------------------+----------------------+  Summary: Right: Total occlusion noted in the superficial femoral artery. Reconstitution of the distal SFA via collateral. Dampened flow noted in the posterior tibial and anterior tibial ateries. Unable to detect flow in the peroneal artery. Left: Total occlusion noted in the popliteal artery. Reconstitution of the proximal PTA via collateral. Monophasic flow noted throughout the anterior tibial and peroneal arteries.  See table(s) above for measurements and observations. Electronically signed by Waverly Ferrari MD on 03/14/2023 at 3:04:15 PM.    Final    ECHOCARDIOGRAM COMPLETE  Result Date: 03/14/2023    ECHOCARDIOGRAM REPORT   Patient Name:   Sarah Wolf Date of Exam: 03/14/2023 Medical Rec #:  [id number]  Height:       65.0 in Accession #:    [id number] Weight:       131.4 lb Date of Birth:  1934-08-08  BSA:          1.655 m Patient Age:    88 years   BP:           153/80 mmHg Patient Gender: F          HR:  100 bpm. Exam Location:  Inpatient Procedure: 2D Echo, Cardiac Doppler and Color Doppler Indications:    Arhythmia  History:        Patient has prior history of Echocardiogram examinations, most                 recent 03/09/2019. Arrythmias:Atrial Fibrillation.  Sonographer:    Darlys Gales Referring Phys: [id number] RONDELL A SMITH IMPRESSIONS  1. Left ventricular ejection fraction, by estimation, is 40 to 45%. The left ventricle has mildly decreased function. Left ventricular endocardial border not optimally defined to evaluate regional wall motion. Left ventricular diastolic function could not be evaluated.  2. Right ventricular systolic function was not well visualized. The right ventricular size is dilated.  3. A small pericardial effusion is present. The pericardial effusion is surrounding the apex.  4. The mitral valve is grossly normal. Trivial mitral valve regurgitation. No evidence of mitral stenosis.  5. The aortic valve was not well visualized. Aortic valve regurgitation is not visualized.  6. Cannot exclude a small PFO.  7. Technically difficult study. Comparison(s): Unable to view prior study. LVEF decreased from prior reporting. FINDINGS  Left Ventricle: Left ventricular ejection fraction, by estimation, is 40 to 45%. The left ventricle has mildly decreased function. Left ventricular endocardial border not optimally defined to evaluate regional wall motion. The left ventricular internal cavity size was normal in size. There is no left ventricular hypertrophy. Left ventricular diastolic function could not be evaluated due to atrial fibrillation. Left ventricular diastolic function could not be evaluated. Right Ventricle: The right ventricular size is dilated. No increase in right ventricular wall thickness. Right ventricular systolic function was not well visualized. Left Atrium: Left atrial size was normal in size. Right Atrium: Right atrial size was normal in size. Pericardium: A small pericardial effusion is present. The pericardial effusion is surrounding the apex. Mitral Valve: The mitral valve is grossly normal. Trivial mitral valve regurgitation. No evidence of mitral valve stenosis. Tricuspid Valve: The tricuspid valve is not well visualized. Tricuspid valve regurgitation is  not demonstrated. Aortic Valve: The aortic valve was not well visualized. Aortic valve regurgitation is not visualized. Pulmonic Valve: The pulmonic valve was not well visualized. Pulmonic valve regurgitation is not visualized. No evidence of pulmonic stenosis. Aorta: The aortic root and ascending aorta are structurally normal, with no evidence of dilitation. IAS/Shunts: Cannot exclude a small PFO.  LEFT VENTRICLE PLAX 2D LVIDd:         3.70 cm   Diastology LVIDs:         2.80 cm   LV e' medial:    3.70 cm/s LV PW:         0.90 cm   LV E/e' medial:  26.1 LV IVS:        0.70 cm   LV e' lateral:   5.55 cm/s LVOT diam:     1.90 cm   LV E/e' lateral: 17.4 LV SV:         41 LV SV Index:   25 LVOT Area:     2.84 cm  LEFT ATRIUM             Index        RIGHT ATRIUM           Index LA Vol (A2C):   44.7 ml 27.01 ml/m  RA Area:     13.40 cm LA Vol (A4C):   50.7 ml 30.64 ml/m  RA Volume:   28.80 ml  17.41  ml/m LA Biplane Vol: 49.0 ml 29.61 ml/m  AORTIC VALVE LVOT Vmax:   94.80 cm/s LVOT Vmean:  62.600 cm/s LVOT VTI:    0.145 m MITRAL VALVE               TRICUSPID VALVE MV Area (PHT): 4.60 cm    TR Peak grad:   13.7 mmHg MV Decel Time: 165 msec    TR Vmax:        185.00 cm/s MV E velocity: 96.70 cm/s                            SHUNTS                            Systemic VTI:  0.14 m                            Systemic Diam: 1.90 cm Riley Lam MD Electronically signed by Riley Lam MD Signature Date/Time: 03/14/2023/11:56:26 AM    Final    VAS [place] LOWER EXTREMITY VENOUS (DVT) (ONLY [facility] & [facility])  Result Date: 03/13/2023  Lower Venous DVT Study Patient Name:  Sarah Wolf  Date of Exam:   03/13/2023 Medical Rec #: [id number]   Accession #:    [id number] Date of Birth: [birth year]   Patient Gender: F Patient Age:   12 years Exam Location:  [facility] Procedure:      VAS [place] LOWER EXTREMITY VENOUS (DVT) Referring Phys: Jomarie Longs STEVENS  --------------------------------------------------------------------------------  Indications: Right leg pain x1 day.  Comparison Study: 03-29-2019 Prior right lower extremity venous study s/p                   surgery was negative for DVT.                    No other prior history. Performing Technologist: Jean Rosenthal RDMS, RVT  Examination Guidelines: A complete evaluation includes B-mode imaging, spectral Doppler, color Doppler, and power Doppler as needed of all accessible portions of each vessel. Bilateral testing is considered an integral part of a complete examination. Limited examinations for reoccurring indications may be performed as noted. The reflux portion of the exam is performed with the patient in reverse Trendelenburg.  +---------+---------------+---------+-----------+----------+--------------+ RIGHT    CompressibilityPhasicitySpontaneityPropertiesThrombus Aging +---------+---------------+---------+-----------+----------+--------------+ CFV      Full           Yes      Yes                                 +---------+---------------+---------+-----------+----------+--------------+ SFJ      Full                                                        +---------+---------------+---------+-----------+----------+--------------+ FV Prox  Full                                                        +---------+---------------+---------+-----------+----------+--------------+ FV Mid  Full                                                        +---------+---------------+---------+-----------+----------+--------------+ FV DistalFull                                                        +---------+---------------+---------+-----------+----------+--------------+ PFV      Full                                                        +---------+---------------+---------+-----------+----------+--------------+ POP      Full           Yes      Yes                                  +---------+---------------+---------+-----------+----------+--------------+ PTV      Full                                                        +---------+---------------+---------+-----------+----------+--------------+ PERO     Full                                                        +---------+---------------+---------+-----------+----------+--------------+   +----+---------------+---------+-----------+----------+--------------+ LEFTCompressibilityPhasicitySpontaneityPropertiesThrombus Aging +----+---------------+---------+-----------+----------+--------------+ CFV Full           Yes      Yes                                 +----+---------------+---------+-----------+----------+--------------+    Summary: RIGHT: - There is no evidence of deep vein thrombosis in the lower extremity.  - No cystic structure found in the popliteal fossa.  - INCIDENTAL: Occlusion of the proximal to mid SFA. Dampened monophasic flow noted in the distal SFA, Pop A, and PTA.  LEFT: - No evidence of common femoral vein obstruction.   *See table(s) above for measurements and observations. Electronically signed by Lemar Livings MD on 03/13/2023 at 4:09:18 PM.    Final    CT ANGIO AO+BIFEM W & OR WO CONTRAST  Result Date: 03/13/2023 CLINICAL DATA:  Chest pain, iliac artery dissection EXAM: CT ANGIOGRAPHY OF CHEST CT ANGIOGRAPHY OF ABDOMINAL AORTA WITH ILIOFEMORAL RUNOFF TECHNIQUE: Multidetector CT imaging of the chest abdomen, pelvis and lower extremities was performed using the standard protocol during bolus administration of intravenous contrast. Multiplanar CT image reconstructions and MIPs were obtained to evaluate the vascular anatomy. RADIATION DOSE REDUCTION: This exam was performed according to the departmental dose-optimization program which includes automated exposure control, adjustment of the mA and/or kV  according to patient size and/or use of iterative reconstruction technique.  CONTRAST:  OMNIPAQUE IOHEXOL 350 MG/ML SOLN COMPARISON:  None Available. FINDINGS: CTA CHEST Cardiovascular: Conventional 3 vessel arch anatomy. Elongation of the isthmus and descending thoracic aorta consistent with type 3 arch. Extensive atherosclerotic plaque. The aortic root is normal in caliber at 3 cm measured at the sinuses of Valsalva. The ascending thoracic aorta is normal in caliber at 3.2 cm. The transverse thoracic aorta is mildly aneurysmal at 3.2 cm. Flow artifact present throughout the descending thoracic aorta consistent with poor cardiac output. The main pulmonary artery is normal in caliber. No evidence of pulmonary embolus. Cardiomegaly with significant biatrial enlargement. Incomplete opacification of the left atrial appendage raising concern for left atrial appendage thrombus. Mediastinum: Normal thyroid gland. Nonspecific 1.3 cm soft tissue nodule in the anterior mediastinum (image 61 series 7). Pericardial fluid is present in the superior pericardial recesses. Lungs/Pleura: Interlobular septal thickening consistent with mild interstitial edema. Diffuse bronchial wall thickening. Elevation of the left hemidiaphragm. Small bilateral pleural effusions with associated bibasilar atelectasis. Musculoskeletal: No acute fracture or aggressive appearing lytic or blastic osseous lesion. CTA ABD/PELVIS WITH RUNOFF VASCULAR Aorta: Extensive irregular and ulcerated atherosclerotic plaque throughout the abdominal aorta. No evidence of aneurysm or dissection. Celiac: Patent without evidence of aneurysm, dissection, vasculitis or significant stenosis. SMA: Calcified plaque results in at least moderate stenosis of the proximal SMA. Conventional hepatic arterial anatomy. Renals: Single renal arteries bilaterally. Calcified plaque results in moderate stenosis of the proximal right renal artery. Calcified plaque results in mild stenosis of the left renal artery. IMA: Patent without evidence of aneurysm,  dissection, vasculitis or significant stenosis. RIGHT Lower Extremity Inflow: Calcified plaque throughout the right common iliac artery. 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 The external iliac artery is spared from disease. Outflow: The common femoral artery is widely patent. Profunda femoral branches are widely patent. Complete occlusion of the superficial femoral artery beginning in the upper thigh several cm beyond the vessel origin. The vessel then reconstitutes via collateral flow in the distal thigh. Multifocal stenoses of moderate to high-grade throughout Hunter's canal. Diffusely diseased but patent popliteal artery without significant stenosis. Runoff: High origin of the posterior tibial artery arising from the proximal P3 segment of the popliteal artery. Long tibioperoneal trunk which then bifurcates into the anterior tibial and peroneal arteries. Patent 3 vessel runoff to the ankle. LEFT Lower Extremity Inflow: Calcified plaque throughout the iliac system. No aneurysm, dissection stenosis or occlusion. Outflow: The common femoral and profunda femoral arteries are widely patent. The superficial femoral artery demonstrates scattered mild stenoses in the upper and mid thigh. There is a more moderate focal stenosis within Hunter's canal. The popliteal artery is patent in the P1 and P2 segments but then occludes in the P3 segment and remains occluded throughout the trifurcation. Runoff: Popliteal distal reconstitution of the anterior tibial and possibly posterior tibial arteries. Veins: No focal venous abnormality. Review of the MIP images confirms the above findings. NON-VASCULAR Hepatobiliary: Diffuse low attenuation of the hepatic parenchyma consistent with hepatic steatosis. Multiple circumscribed water attenuation lesions scattered throughout the liver consistent with cysts. No definite enhancing lesion. Gallbladder is unremarkable. No intra or extrahepatic biliary ductal dilatation.  Pancreas: Unremarkable. No pancreatic ductal dilatation or surrounding inflammatory changes. Partially involved in the upward migration into the thorax due to the presence of the hiatal hernia. Spleen: Normal in size without focal abnormality. Adrenals/Urinary Tract: Normal adrenal glands. No evidence of hydronephrosis, nephrolithiasis or enhancing  renal mass. Small circumscribed water attenuation lesions bilaterally most consistent with simple cysts. No imaging follow-up recommended. Ureters and bladder are unremarkable. Stomach/Bowel: Colonic diverticular disease without CT evidence of active inflammation. No focal bowel wall thickening or evidence of obstruction. Unremarkable appendix. Lymphatic: No suspicious lymphadenopathy. Reproductive: Uterus and bilateral adnexa are unremarkable. Other: No abdominal wall hernia or abnormality. No abdominopelvic ascites. Musculoskeletal: No acute fracture or aggressive appearing lytic or blastic osseous lesion. Multilevel degenerative disc disease and bilateral facet arthropathy. IMPRESSION: CTA CHEST 1. Marked cardiomegaly with biatrial enlargement, small bilateral pleural effusions and interlobular septal thickening consistent with pulmonary edema. Overall findings suggest CHF. 2. Incomplete opacification of the left atrial appendage raises concern for thrombus within the left atrial appendage. Echocardiography could further evaluate if clinically warranted. 3. No evidence of aortic dissection or aneurysm. 4. No evidence of acute pulmonary embolism. 5. Nonspecific 1.3 cm soft tissue nodule in the anterior mediastinum. Differential considerations include thymoma, metastatic adenopathy, and less likely ectopic thyroid tissue or lymphoma. 6. Large hiatal hernia with chronic elevation of the left hemidiaphragm and upward migration of the stomach and tail of the pancreas into the chest. CTA ABD/PELVIS WITH RUNOFF 1. Complete occlusion of the left popliteal artery beginning at  the P3 segment and extending through the trifurcation. Differential considerations include embolic phenomenon (note possible left atrial appendage thrombus described above) versus thrombosis from underlying atherosclerotic plaque. 2. Occlusion right superficial femoral artery beginning several cm beyond the origin in extending into the distal thigh. Imaging appearance favors chronic atherosclerotic occlusive disease rather than embolization. 3. High origin of the right posterior tibial artery noted incidentally. 4. Extensive irregular and ulcerated atherosclerotic plaque throughout the entire abdominal aorta. 5. Moderate stenosis of the proximal SMA. 6. Moderate stenosis of the origin of the right renal artery. 7. Mild stenosis of the proximal left renal artery. 8. 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 9. Hepatic steatosis. 10. Colonic diverticular disease without CT evidence of active inflammation. 11. Additional ancillary findings as above. Electronically Signed   By: Malachy Moan M.D.   On: 03/13/2023 14:13   CT ANGIO CHEST AORTA W/CM &/OR WO/CM  Result Date: 03/13/2023 CLINICAL DATA:  Chest pain, iliac artery dissection EXAM: CT ANGIOGRAPHY OF CHEST CT ANGIOGRAPHY OF ABDOMINAL AORTA WITH ILIOFEMORAL RUNOFF TECHNIQUE: Multidetector CT imaging of the chest abdomen, pelvis and lower extremities was performed using the standard protocol during bolus administration of intravenous contrast. Multiplanar CT image reconstructions and MIPs were obtained to evaluate the vascular anatomy. RADIATION DOSE REDUCTION: This exam was performed according to the departmental dose-optimization program which includes automated exposure control, adjustment of the mA and/or kV according to patient size and/or use of iterative reconstruction technique. CONTRAST:  OMNIPAQUE IOHEXOL 350 MG/ML SOLN COMPARISON:  None Available. FINDINGS: CTA CHEST Cardiovascular: Conventional 3 vessel arch anatomy. Elongation  of the isthmus and descending thoracic aorta consistent with type 3 arch. Extensive atherosclerotic plaque. The aortic root is normal in caliber at 3 cm measured at the sinuses of Valsalva. The ascending thoracic aorta is normal in caliber at 3.2 cm. The transverse thoracic aorta is mildly aneurysmal at 3.2 cm. Flow artifact present throughout the descending thoracic aorta consistent with poor cardiac output. The main pulmonary artery is normal in caliber. No evidence of pulmonary embolus. Cardiomegaly with significant biatrial enlargement. Incomplete opacification of the left atrial appendage raising concern for left atrial appendage thrombus. Mediastinum: Normal thyroid gland. Nonspecific 1.3 cm soft tissue nodule in the anterior mediastinum (image  61 series 7). Pericardial fluid is present in the superior pericardial recesses. Lungs/Pleura: Interlobular septal thickening consistent with mild interstitial edema. Diffuse bronchial wall thickening. Elevation of the left hemidiaphragm. Small bilateral pleural effusions with associated bibasilar atelectasis. Musculoskeletal: No acute fracture or aggressive appearing lytic or blastic osseous lesion. CTA ABD/PELVIS WITH RUNOFF VASCULAR Aorta: Extensive irregular and ulcerated atherosclerotic plaque throughout the abdominal aorta. No evidence of aneurysm or dissection. Celiac: Patent without evidence of aneurysm, dissection, vasculitis or significant stenosis. SMA: Calcified plaque results in at least moderate stenosis of the proximal SMA. Conventional hepatic arterial anatomy. Renals: Single renal arteries bilaterally. Calcified plaque results in moderate stenosis of the proximal right renal artery. Calcified plaque results in mild stenosis of the left renal artery. IMA: Patent without evidence of aneurysm, dissection, vasculitis or significant stenosis. RIGHT Lower Extremity Inflow: Calcified plaque throughout the right common iliac artery. Chronic occlusion of the  right internal iliac artery. The external iliac artery is spared from disease. Outflow: The common femoral artery is widely patent. Profunda femoral branches are widely patent. Complete occlusion of the superficial femoral artery beginning in the upper thigh several cm beyond the vessel origin. The vessel then reconstitutes via collateral flow in the distal thigh. Multifocal stenoses of moderate to high-grade throughout Hunter's canal. Diffusely diseased but patent popliteal artery without significant stenosis. Runoff: High origin of the posterior tibial artery arising from the proximal P3 segment of the popliteal artery. Long tibioperoneal trunk which then bifurcates into the anterior tibial and peroneal arteries. Patent 3 vessel runoff to the ankle. LEFT Lower Extremity Inflow: Calcified plaque throughout the iliac system. No aneurysm, dissection stenosis or occlusion. Outflow: The common femoral and profunda femoral arteries are widely patent. The superficial femoral artery demonstrates scattered mild stenoses in the upper and mid thigh. There is a more moderate focal stenosis within Hunter's canal. The popliteal artery is patent in the P1 and P2 segments but then occludes in the P3 segment and remains occluded throughout the trifurcation. Runoff: Popliteal distal reconstitution of the anterior tibial and possibly posterior tibial arteries. Veins: No focal venous abnormality. Review of the MIP images confirms the above findings. NON-VASCULAR Hepatobiliary: Diffuse low attenuation of the hepatic parenchyma consistent with hepatic steatosis. Multiple circumscribed water attenuation lesions scattered throughout the liver consistent with cysts. No definite enhancing lesion. Gallbladder is unremarkable. No intra or extrahepatic biliary ductal dilatation. Pancreas: Unremarkable. No pancreatic ductal dilatation or surrounding inflammatory changes. Partially involved in the upward migration into the thorax due to the  presence of the hiatal hernia. Spleen: Normal in size without focal abnormality. Adrenals/Urinary Tract: Normal adrenal glands. No evidence of hydronephrosis, nephrolithiasis or enhancing renal mass. Small circumscribed water attenuation lesions bilaterally most consistent with simple cysts. No imaging follow-up recommended. Ureters and bladder are unremarkable. Stomach/Bowel: Colonic diverticular disease without CT evidence of active inflammation. No focal bowel wall thickening or evidence of obstruction. Unremarkable appendix. Lymphatic: No suspicious lymphadenopathy. Reproductive: Uterus and bilateral adnexa are unremarkable. Other: No abdominal wall hernia or abnormality. No abdominopelvic ascites. Musculoskeletal: No acute fracture or aggressive appearing lytic or blastic osseous lesion. Multilevel degenerative disc disease and bilateral facet arthropathy. IMPRESSION: CTA CHEST 1. Marked cardiomegaly with biatrial enlargement, small bilateral pleural effusions and interlobular septal thickening consistent with pulmonary edema. Overall findings suggest CHF. 2. Incomplete opacification of the left atrial appendage raises concern for thrombus within the left atrial appendage. Echocardiography could further evaluate if clinically warranted. 3. No evidence of aortic dissection or aneurysm. 4. No evidence of  acute pulmonary embolism. 5. Nonspecific 1.3 cm soft tissue nodule in the anterior mediastinum. Differential considerations include thymoma, metastatic adenopathy, and less likely ectopic thyroid tissue or lymphoma. 6. Large hiatal hernia with chronic elevation of the left hemidiaphragm and upward migration of the stomach and tail of the pancreas into the chest. CTA ABD/PELVIS WITH RUNOFF 1. 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 Differential considerations include embolic phenomenon (note possible left atrial appendage thrombus  described above) versus thrombosis from underlying atherosclerotic plaque. 2. Occlusion right superficial femoral artery beginning several cm beyond the origin in extending into the distal thigh. Imaging appearance favors chronic atherosclerotic occlusive disease rather than embolization. 3. High origin of the right posterior tibial artery noted incidentally. 4. Extensive irregular and ulcerated atherosclerotic plaque throughout the entire abdominal aorta. 5. Moderate stenosis of the proximal SMA. 6. Moderate stenosis of the origin of the right renal artery. 7. Mild stenosis of the proximal left renal artery. 8. 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 9. Hepatic steatosis. 10. Colonic diverticular disease without CT evidence of active inflammation. 11. Additional ancillary findings as above. Electronically Signed   By: Malachy Moan M.D.   On: 03/13/2023 14:13   DG Chest Portable 1 View  Result Date: 03/13/2023 CLINICAL DATA:  chest pain EXAM: PORTABLE CHEST - 1 VIEW COMPARISON:  03/08/2019 FINDINGS: Bilateral interstitial edema or infiltrates, increased since previous. Chronic elevation of left diaphragmatic leaflet. Heart size upper limits normal. Aortic Atherosclerosis (ICD10-170.0). No effusion. Visualized bones unremarkable. IMPRESSION: Bilateral interstitial edema or infiltrates. Electronically Signed   By: Corlis Leak M.D.   On: 03/13/2023 12:02    Cardiac Studies   Echocardiogram 03/14/2023: Impressions: 1. Left ventricular ejection fraction, by estimation, is 40 to 45%. The  left ventricle has mildly decreased function. Left ventricular endocardial  border not optimally defined to evaluate regional wall motion. Left  ventricular diastolic function could  not be evaluated.   2. Right ventricular systolic function was not well visualized. The right  ventricular size is dilated.   3. A small pericardial effusion is present. The pericardial effusion is  surrounding the apex.    4. The mitral valve is grossly normal. Trivial mitral valve  regurgitation. No evidence of mitral stenosis.   5. The aortic valve was not well visualized. Aortic valve regurgitation  is not visualized.   6. Cannot exclude a small PFO.   7. Technically difficult study.   Comparison(s): Unable to view prior study. LVEF decreased from prior  reporting.  _______________  Lower Extremity Arterial Ultrasound 03/14/2023: Summary:  Right: Total occlusion noted in the superficial femoral artery.  Reconstitution of the distal SFA via collateral. Dampened flow noted in  the posterior tibial and anterior tibial ateries. Unable to detect flow in  the peroneal artery.   Left: Total occlusion noted in the popliteal artery. Reconstitution of the  proximal PTA via collateral. Monophasic flow noted throughout the anterior  tibial and peroneal arteries.   ABI Summary: Right: Resting right ankle-brachial index indicates critical limb  ischemia.   Left: Resting left ankle-brachial index indicates moderate left lower  extremity arterial disease. The left toe-brachial index is abnormal.    Patient Profile     88 y.o. female with a history of paroxysmal atrial fibrillation noted on Echo in 2020, hypertension, prior DVT in 2020 after femur fracture (completed course of anticoagulation), and multiple falls who was admitted on 03/13/2023 for critical limb ischemia  after presenting with leg pain. CTA showed complete occlusion of the left popliteal artery and right SFA (possible embolic phenomenon). Also noted to be in rapid atrial fibrillation and acute CHF for which Cardiology was consulted.  Assessment & Plan    Paroxysmal Atrial Fibrillation with RVR Paroxysmal atrial fibrillation was noted on an Echo in 2020 but she was never seen by Cardiology. She presented with leg pain and was found to have critical limb ischemia but was also noted to be in atrial fibrillation with RVR. Echo showed LVEF of 40-45%.  Electrolytes and TSH okay. She was initially started on IV Amiodarone but this was stopped due to concern for embolic event. Therefore, she was started on IV Diltiazem instead. - Rate currently in the 90s to low 100s. - Continue IV Diltiazem for now. Will switch to Lopressor as below and then try to wean off the Diltiazem given reduced EF. - Currently on Coreg but will switch to Lopressor 25mg  twice daily as this is sometimes better at providing rate control. Can then transition to Toprol-XL prior to discharge. - Continue IV Heparin. Will need to start DOAC after procedures are completed.  - Would ultimately benefit from restoration of sinus rhythm. However, given concern that lower extremity arterial occlusion was embolic in nature, this may need to be done as an outpatient after adequate anticoagulation. Will wait to see what PV angiogram shows today.  Acute on Chronic HFrEF Echo showed LVEF of 40-45% but endocardial border was not optimally defined to evaluate for regional wall motion abnormalities. She received 1 dose of IV Lasix in the ED. - Euvolemic on exam. - Currently on Coreg but will switch to Lopressor 25mg  twice daily as above for better rate control. Can then transition to Toprol-XL prior to discharge. - Will planned to start Losartan after we get rate controlled. Can also consider adding Spironolactone or SGLT2 inhibitor prior to discharge. - Etiology unclear. She does have extensive atherosclerotic plaque mention on CTA on admission but no specific mention of coronary artery calcifications although cannot rule out ischemic cardiomyopathy. Possibly tachy-mediated in nature - family reports more fatigue over the last couple of months and wonder if this could be due to atrial fibrillation. Would try to restore sinus rhythm and then repeat Echo. If EF still down at that time, could consider ischemic evaluation.  Hypertension  BP mildly elevated at times.  - Continue medications for CHF as  above.  Occlusion of Bilateral Lower Extremity Arteries CTA showed completed occlusion of left popliteal artery and right SFA as well as CTO of right internal iliac artery. Lower extremity arterial ultrasounds showed also showed total occlusion of left popliteal artery and right SFA. ABIs inidicative of critical limb ischemia on the right and moderate disease on the left. - Plan is for PV angiogram today. Management per Vascular Surgery.  Otherwise, per primary team: - Anterior medistinal mass - Hiatal hernia - History of DVT  For questions or updates, please contact [facility] Please consult [email or web address] for contact info under        Signed, Corrin Parker, PA-C  03/15/2023, 7:24 AM    Pt seen and examined   I agree with findings as noted above by C Goodrich Pt appears comfortable in bed Lungs CTA  Cardiac Irreg irreg  Ext withotu edema  Tele  Afib 90s   Echo yesterday LVEF 40 to 45%     Plan as noted    Afib  Keep on IV dilt for  now but would transition to po lopressor when taking po Continue heparin     HFrEF    Volume status appears OK Transition to metoprolol     Echo will need to be repeated in time to reeval LVEF   Will continue to follow

## 2023-03-15 NOTE — Significant Event (Signed)
TRH Night Coverage note:  Paged by RN that pt had "aspiration event" with worsening hypoxia, gurgling respirations, respiratory distress.  Pt seen at bedside.  Satting mid 80s now on NRB (up from 4L prior), gurgling respirations, having respiratory distress.  This was soon followed by decreased mentation.  HFNC applied temporarily for oxygenation while awaiting PCCM who also were called to bedside.  O2 sat improved to 90 on the HFNC.  Pt remained obtunded.  PCCM saw pt at bedside, pt still Full code.  PCCM made decision to intubate patient and performed intubation.  PCCM assuming care of patient post intubation.

## 2023-03-15 NOTE — Progress Notes (Signed)
OT Cancellation Note  Patient Details Name: Kathrin Sandrock MRN: [id number] DOB: [birth year]   Cancelled Treatment:    Reason Eval/Treat Not Completed: Patient at procedure or test/ unavailable (Pt currently off unit at Cath Lab with scheduled abdominal aortogram via Left CFA with BLE runoff planned for today. OT to reattempt to see pt for skilled OT eval at a later time as appropriate/available.)  Kamariya Blevens "Ronaldo Miyamoto" M., OTR/L, MA Acute Rehab [phone]   Lendon Colonel 03/15/2023, 11:07 AM

## 2023-03-15 NOTE — Progress Notes (Signed)
   Heart Failure Stewardship Pharmacist Progress Note   PCP: Tally Joe, MD PCP-Cardiologist: Dietrich Pates, MD    HPI:  88 yo F with PMH of HTN, afib, and DVT.   Presented to the ED on 8/26 with acute onset of pain in her right leg. When EMS arrived at her home, she was found to be in afib RVR. Denied chest pain, palpitations, shortness of breath, or edema. She does not routinely go to her doctor. BNP 799.9 and hs trop 55>57. CXR with bilateral interstitial edema. Doppler ultrasound of LE did not show any signs of DVT but did note occlusion of proximal to mid SFA. 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 Vascular consulted and plan for PV angiography on 8/28. ECHO on 8/27 showed LVEF 40-45% (was 60-65% in 02/2019), small pericardial effusion, and trivial MR.   Current HF Medications: Beta Blocker: metoprolol tartrate 25 mg BID ACE/ARB/ARNI: losartan 25 mg daily  Prior to admission HF Medications: Beta blocker: carvedilol 3.125 mg BID  Pertinent Lab Values: Serum creatinine 0.96, BUN 22, Potassium 3.9, Sodium 133, BNP 799.9, Magnesium 2.2  Vital Signs: Weight: 131 lbs (admission weight: 126 lbs) Blood pressure: 120-150/80s  Heart rate: 80-110s  I/O: net -0.3L since admission  Medication Assistance / Insurance Benefits Check: Does the patient have prescription insurance?  Yes Type of insurance plan: BCBS Medicare  Outpatient Pharmacy:  Prior to admission outpatient pharmacy: [facility] Is the patient willing to use [facility] TOC pharmacy at discharge? Yes Is the patient willing to transition their outpatient pharmacy to utilize a [facility] outpatient pharmacy?   Pending    Assessment: 1. Acute on chronic systolic  CHF (LVEF 40-45%). NYHA class II symptoms. - Not volume overloaded on exam - Agree with transitioning to metoprolol tartrate 25 mg BID for better rate control - Consider adding Entresto 24/26 mg BID after PV procedure today - Consider adding spironolactone and SGLT2i prior to discharge - Will need to transition off diltiazem given EF 40%  Plan: 1) Medication changes recommended at this time: - Add Entresto 24/26 mg BID after PV procedure today  2) Patient assistance: Sherryll Burger copay $45 - Farxiga/Jardiance copay $45  3)  Education  - To be completed prior to discharge  Sharen Hones, PharmD, BCPS Heart Failure Engineer, building services Phone [phone]

## 2023-03-15 NOTE — Progress Notes (Signed)
Pt found at end of stretcher, sitting, by Judeth Cornfield, RN, placed back in flat, supine position by Alveta Heimlich, and [place], RN's, pt informed not to get up and risk involved, Purewick placed and peri care given by Judeth Cornfield, RN, safety maintained, call bell present  Judeth Cornfield, RN called pt's nephew per pt request with update

## 2023-03-15 NOTE — Progress Notes (Signed)
RN in patients room to reposition patient and troubleshoot SPO2 alarm not reading appropriately at 7pm. Patient Is restless and fidgety and is also being monitored with telesitter.   705 PM RN responded to another RN saying the patient is bleeding and came to patients room immediately. Upon assessment, this RN noted the Heparin line to be disconnected from the L fem sheath and blood was exiting the sheath onto the bed as well as the patient complaining of 10/10 pain. This RN along with another RN cleaned the sheath site and heparin line with chlorhexidine and paged Dr. Chestine Spore, Vascular surgery MD. Dr. Chestine Spore MD notified of the occurrence and verbally ordered for a CBC to be drawn, phlebotomy contacted by RN. Dr. Chestine Spore MD ordered for patient to have a safety sitter. Pain meds given per MD order, see MAR. Patient was cleaned up and no further complications related to the sheath occurred.   CBC obtained, Hemoglobin and hematocrit stable, see results.  Safety sitter at the bedside with patient. Handoff given to night shift RN, Marletta Lor.

## 2023-03-15 NOTE — Progress Notes (Signed)
ANTICOAGULATION CONSULT NOTE  Pharmacy Consult for heparin Indication: atrial fibrillation   No Known Allergies  Patient Measurements: Height: 5\' 5"  (165.1 cm) Weight: 57.5 kg (126 lb 12.8 oz) IBW/kg (Calculated) : 57 Heparin Dosing Weight: TBW  Vital Signs: Temp: 98 F (36.7 C) (08/28 1600) Temp Source: Oral (08/28 1600) BP: 136/124 (08/28 1900) Pulse Rate: 74 (08/28 1900)  Labs: Recent Labs    03/13/23 1030 03/13/23 1235 03/13/23 1439 03/13/23 2226 03/14/23 1008 03/15/23 1027 03/15/23 1932  HGB 14.6  --   --   --  14.0 12.1 12.7  HCT 45.1  --   --   --  42.8 37.7 39.5  PLT 181  --   --   --  177 148* 153  APTT  --   --  37*  --   --   --   --   LABPROT 15.1  --   --   --   --   --   --   INR 1.2  --   --   --   --   --   --   HEPARINUNFRC  --   --   --    < > 0.37 0.13* <0.10*  CREATININE 0.87  --   --   --  0.96 0.74  --   TROPONINIHS 55* 57*  --   --   --   --   --    < > = values in this interval not displayed.    Estimated Creatinine Clearance: 45.4 mL/min (by C-G formula based on SCr of 0.74 mg/dL).  Assessment: 88 y.o. female with Afib w/  possible LAA thrombus and occlusion of LE artery on heparin. Pharmacy dosing heparin  S/p Aortogram w/tPA. Heparin restarted post procedure with tPA infusion. Level came back undetectable this PM. No further bleeding. Will increase rate and check in 6 hr.   Goal of Therapy:  Heparin level: 0.2-0.5 Monitor platelets by anticoagulation protocol: Yes   Plan:  Increase heparin to 950 units/hr Check 6 hr heparin level and fibrinogen   Ulyses Southward, PharmD, [place], AAHIVP, CPP Infectious Disease Pharmacist 03/15/2023 8:20 PM

## 2023-03-15 NOTE — Progress Notes (Addendum)
  Progress Note    03/15/2023 8:23 AM * No surgery date entered *  Subjective:  no complaints this morning   Vitals:   03/15/23 0622 03/15/23 0805  BP: 118/80 134/79  Pulse: (!) 105 (!) 52  Resp: 20 (!) 23  Temp: 97.8 F (36.6 C) 97.6 F (36.4 C)  SpO2: 94% 91%   Physical Exam: Lungs:  non labored Incisions:  none Extremities:  feet warm with motor and sensation intact Neurologic: A&O  CBC    Component Value Date/Time   WBC 10.1 03/14/2023 1008   RBC 4.50 03/14/2023 1008   HGB 14.0 03/14/2023 1008   HCT 42.8 03/14/2023 1008   PLT 177 03/14/2023 1008   MCV 95.1 03/14/2023 1008   MCH 31.1 03/14/2023 1008   MCHC 32.7 03/14/2023 1008   RDW 14.1 03/14/2023 1008   LYMPHSABS 1.2 03/13/2023 1030   MONOABS 0.5 03/13/2023 1030   EOSABS 0.2 03/13/2023 1030   BASOSABS 0.1 03/13/2023 1030    BMET    Component Value Date/Time   NA 133 (L) 03/14/2023 1008   K 3.9 03/14/2023 1008   CL 94 (L) 03/14/2023 1008   CO2 26 03/14/2023 1008   GLUCOSE 125 (H) 03/14/2023 1008   BUN 22 03/14/2023 1008   CREATININE 0.96 03/14/2023 1008   CALCIUM 8.8 (L) 03/14/2023 1008   GFRNONAA 58 (L) 03/14/2023 1008   GFRAA >60 03/11/2019 0535    INR    Component Value Date/Time   INR 1.2 03/13/2023 1030     Intake/Output Summary (Last 24 hours) at 03/15/2023 0823 Last data filed at 03/15/2023 2706 Gross per 24 hour  Intake 443.17 ml  Output 700 ml  Net -256.83 ml     Assessment/Plan:  88 y.o. female  with acute occlusion of R SFA and L popliteal artery by CTA   Continues to be without rest pain.  Motor and sensation intact both feet Plan is for aortogram via L CFA with BLE runoff with Dr. Karin Lieu today Procedure was reviewed in detail with the patient; she is agreeable to proceed Continue npo   Emilie Rutter, PA-C [facility] [phone] 03/15/2023 8:23 AM  VASCULAR STAFF ADDENDUM: I have independently interviewed and examined the patient. I agree  with the above.  On exam, pain in the right foot before the procedure.  ALI with rest pain.  After discussing the risks and benefits of RLE angiogram with possible intervention, Kyri elected to proceed.    Fara Olden, MD [facility] Phone Number: [phone] 03/15/2023 11:52 AM

## 2023-03-15 NOTE — Procedures (Addendum)
Intubation Procedure Note  Sarah Wolf  [id number]  12-14-1934  Date:03/15/23  Time:10:05 PM   Provider Performing:Teara Duerksen V. Nevaeha Finerty    Procedure: Intubation (31500)  Indication(s) Respiratory Failure  Consent Unable to obtain consent due to emergent nature of procedure. Son informed   Anesthesia Etomidate and Fentanyl   Time Out Verified patient identification, verified procedure, site/side was marked, verified correct patient position, special equipment/implants available, medications/allergies/relevant history reviewed, required imaging and test results available.   Sterile Technique Usual hand hygeine, masks, and gloves were used   Procedure Description Patient positioned in bed supine.  Sedation given as noted above.  Patient was intubated with endotracheal tube using Glidescope.  View was Grade 1 full glottis .  LT vocal cord had hematoma. Number of attempts was 1.  Colorimetric CO2 detector was consistent with tracheal placement.   Complications/Tolerance None; patient tolerated the procedure well. Chest X-ray is ordered to verify placement.   EBL Minimal   Specimen(s) None   Procedure performed by Patsy Lager PA under my direct supervision  Comer Locket. Vassie Loll MD

## 2023-03-15 NOTE — Progress Notes (Signed)
ANTICOAGULATION CONSULT NOTE  Pharmacy Consult for heparin Indication: atrial fibrillation   No Known Allergies  Patient Measurements: Height: 5\' 5"  (165.1 cm) Weight: 57.5 kg (126 lb 12.8 oz) IBW/kg (Calculated) : 57 Heparin Dosing Weight: TBW  Vital Signs: Temp: 97.6 F (36.4 C) (08/28 0805) Temp Source: Oral (08/28 0805) BP: 114/60 (08/28 1600) Pulse Rate: 66 (08/28 1600)  Labs: Recent Labs    03/13/23 1030 03/13/23 1235 03/13/23 1439 03/13/23 2226 03/14/23 1008 03/15/23 1027  HGB 14.6  --   --   --  14.0 12.1  HCT 45.1  --   --   --  42.8 37.7  PLT 181  --   --   --  177 148*  APTT  --   --  37*  --   --   --   LABPROT 15.1  --   --   --   --   --   INR 1.2  --   --   --   --   --   HEPARINUNFRC  --   --   --  0.93* 0.37 0.13*  CREATININE 0.87  --   --   --  0.96 0.74  TROPONINIHS 55* 57*  --   --   --   --     Estimated Creatinine Clearance: 45.4 mL/min (by C-G formula based on SCr of 0.74 mg/dL).  Assessment: 88 y.o. female with Afib w/  possible LAA thrombus and occlusion of LE artery on heparin. Pharmacy dosing heparin  S/p Aortogram w/tPA. Heparin restarted post procedure with tPA infusion.   Goal of Therapy:  Heparin level: 0.2-0.5 Monitor platelets by anticoagulation protocol: Yes   Plan:  Continue heparin 800 units/hr Check 6 hr heparin level and fibrinogen   Ulyses Southward, PharmD, [place], AAHIVP, CPP Infectious Disease Pharmacist 03/15/2023 4:18 PM

## 2023-03-15 NOTE — Consult Note (Signed)
NAMEBrennley Wolf, MRN:  [id number], DOB:  [birth year], LOS: 2 ADMISSION DATE:  03/13/2023, CONSULTATION DATE:  8/28 REFERRING MD:  hospitalist, CHIEF COMPLAINT:  Acute respiratory failure w/ hypoxia   History of Present Illness:  Patient is a 88 yo F w/ pertinent PMH PAF, HTN, DVT 2020 no longer on DOAC presents to [facility] on 8/26 w/ critical limb ischemia RLE.  Patient complaining of worsening BLE pain worse on right. Patient noted to be in afib RVR rates 140s. SBP 160s. BNP 799. DVT US no signs of dvt but occlusion of proximal mid SFA. 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 Vasc surgery and cards consulted. Started on heparin drip and given metoprolol. Started on diltiazem drip. TRH admitted to floor.   On 8/28 patient underwent thrombolysis of RLE. That evening patient with worsening encephalopathy. Had aspiration event and became more hypoxic and worsening AMS. PCCM consulted.  Pertinent  Medical History  PAF DVT 2020 no longer on [facility] HTN  Significant Hospital Events: Including procedures, antibiotic start and stop dates in addition to other pertinent events   8/26 admitted to [facility] 8/28 worsening encephalopathy and possible aspiration event leading to worsening hypoxia; pccm consulted and required intubation.   Interim History / Subjective:  Patient obtunded w/ no cough/gag On HFNC 15 L sats 91% Labored breathing  Objective   Blood pressure (!) 136/124, pulse 90, temperature 98 F (36.7 C), temperature source Oral, resp. rate 18, height 5\' 5"  (1.651 m), weight 57.5 kg, SpO2 90%.        Intake/Output Summary (Last 24 hours) at 03/15/2023 2151 Last data filed at 03/15/2023 6962 Gross per 24 hour  Intake --  Output 300 ml  Net -300 ml   Filed Weights   03/13/23 1024  03/14/23 0500 03/15/23 0622  Weight: 63.5 kg 59.6 kg 57.5 kg    Examination: General:  critically ill appearing in resp distress HEENT: MM pink/moist; ETT in place Neuro: lethargic; unresponsive to pain; no cough/gag CV: s1s2, afib rate 60s, no m/r/g PULM:  dim rhonchi BS bilaterally; on 15 l HFNC sats 91% GI: soft, bsx4 active  Extremities: cool/dry, LLE Skin: no rashes or lesions    Resolved Hospital Problem list     Assessment & Plan:   Acute respiratory failure w/ hypoxia Possible aspiration Plan: -will intubate -CXR to confirm ett position -LTVV strategy with tidal volumes of 6-8 cc/kg ideal body weight -check ABG and adjust settings accordingly  -Wean PEEP/FiO2 for SpO2 >92% -VAP bundle in place -Daily SAT and SBT -PAD protocol in place -wean sedation for RASS goal 0 to -1 -trach aspirate -unasyn for asp ppx  Acute encephalopathy: patient with worsening delirium and agitation today; also hypoxic Plan: -consider ct head when stable -limit sedation  -abg and adjust vent for normoxia and normocapnia -delirium precautions  Hypotension post procedure Plan: -given some iv fluids -levo for map goal >65 -cardene and metoprolol dc'd  Critical limb ischemia right lower extremity  -likely a cardioembolic event Plan: -vascular surgery following -cont heparin and tpa  PAF RVR Hypokalemia Plan: -cards following -tele monitoring -heparin -hold metoprolol and cardene w/ hypotension -trend bmp and mag and replete electrolytes as needed  Systolic CHF HTN -echo lvef 40-45% Plan: -HF following -currently hypotensive so GDMT on hold for now -daily weights; strict I/o's  DMT2 -A1c this admission 6.6 Plan: -ssi and cbg monitoring  Hx of dvt Plan: -heparin  Lesion of mediastinum -incidental finding on CT chest of nodule on anterior mediastinum Plan: -f/u outpt    Best Practice (right click and "Reselect all SmartList Selections" daily)    Diet/type: NPO DVT prophylaxis: systemic heparin GI prophylaxis: PPI Lines: N/A Foley:  N/A Code Status:  full code Last date of multidisciplinary goals of care discussion [8/28 family updated by Dr. Delia Chimes that patient is requiring intubation and is okay with [place] to proceed]  Labs   CBC: Recent Labs  Lab 03/13/23 1030 03/14/23 1008 03/15/23 1027 03/15/23 1932  WBC 7.0 10.1 10.8* 13.0*  NEUTROABS 5.1  --   --   --   HGB 14.6 14.0 12.1 12.7  HCT 45.1 42.8 37.7 39.5  MCV 98.5 95.1 96.2 99.0  PLT 181 177 148* 153    Basic Metabolic Panel: Recent Labs  Lab 03/13/23 1030 03/13/23 1559 03/14/23 1008 03/15/23 1027  NA 133*  --  133* 135  K 3.5  --  3.9 3.2*  CL 93*  --  94* 98  CO2 27  --  26 28  GLUCOSE 140*  --  125* 102*  BUN 14  --  22 16  CREATININE 0.87  --  0.96 0.74  CALCIUM 9.1  --  8.8* 8.4*  MG  --  2.2  --   --    GFR: Estimated Creatinine Clearance: 45.4 mL/min (by C-G formula based on SCr of 0.74 mg/dL). Recent Labs  Lab 03/13/23 1030 03/14/23 1008 03/15/23 1027 03/15/23 1932  WBC 7.0 10.1 10.8* 13.0*    Liver Function Tests: Recent Labs  Lab 03/13/23 1030 03/15/23 1027  AST 28 27  ALT 19 22  ALKPHOS 60 44  BILITOT 1.3* 1.4*  PROT 7.0 5.3*  ALBUMIN 3.6 2.7*   No results for input(s): "LIPASE", "AMYLASE" in the last 168 hours. No results for input(s): "AMMONIA" in the last 168 hours.  ABG No results found for: "PHART", "PCO2ART", "PO2ART", "HCO3", "TCO2", "ACIDBASEDEF", "O2SAT"   Coagulation Profile: Recent Labs  Lab 03/13/23 1030  INR 1.2    Cardiac Enzymes: No results for input(s): "CKTOTAL", "CKMB", "CKMBINDEX", "TROPONINI" in the last 168 hours.  HbA1C: Hgb A1c MFr Bld  Date/Time Value Ref Range Status  03/15/2023 10:27 AM 6.6 (H) 4.8 - 5.6 % Final    Comment:    (NOTE) Pre diabetes:          5.7%-6.4%  Diabetes:              >6.4%  Glycemic control for   <7.0% adults with diabetes     CBG: Recent Labs  Lab  03/13/23 1734 03/13/23 2033 03/14/23 0753  GLUCAP 181* 166* 139*    Review of Systems:   Patient is encephalopathic; therefore, history has been obtained from chart review.    Past Medical History:  She,  has no past medical history on file.   Surgical History:   Past Surgical History:  Procedure Laterality Date   ORIF FEMUR FRACTURE Right 03/08/2019   Procedure: OPEN REDUCTION INTERNAL FIXATION (ORIF) DISTAL FEMUR FRACTURE;  Surgeon: Kathryne Hitch, MD;  Location: [facility] ORS;  Service: Orthopedics;  Laterality: Right;     Social History:   reports that she has never smoked. She has never used smokeless tobacco.   Family History:  Her family history is not on file.   Allergies No Known Allergies   Home  Medications  Prior to Admission medications   Medication Sig Start Date End Date Taking? Authorizing Provider  carvedilol (COREG) 3.125 MG tablet Take 1 tablet (3.125 mg total) by mouth 2 (two) times daily. 03/12/19 04/11/19  Marzetta Board, MD     Critical care time: 45 minutes     JD Anselm Lis [facility] 03/15/2023, 10:10 PM  Please see [email or web address] for pager details.  From 7A-7P if no response, please call [phone]. After hours, please call ELink [phone].

## 2023-03-15 NOTE — Progress Notes (Signed)
PT Cancellation Note  Patient Details Name: Sarah Wolf MRN: [id number] DOB: 08/02/1934   Cancelled Treatment:    Reason Eval/Treat Not Completed: Patient at procedure or test/unavailable.   Pt off floor in a revascularization procedure.  Will see pt 8/29 as able. 03/15/2023  Jacinto Halim., PT Acute Rehabilitation Services [phone]  (office)   Eliseo Gum Domenique Quest 03/15/2023, 4:14 PM

## 2023-03-15 NOTE — Progress Notes (Signed)
Pharmacy Antibiotic Note  Sarah Wolf is a 88 y.o. female admitted on 03/13/2023 with aspiration pneumonia.  Pharmacy has been consulted for Unasyn dosing. Patient developed worsening respiratory distress and hypoxemia and was intubated. WBC 10.8>13, afebrile, and sCr 0.74 (bl~0.6-0.8)  Plan: Unasyn 3g IV every 6 hours Monitor renal function Follow up signs of clinical improvement, LOT  Height: 5\' 5"  (165.1 cm) Weight: 57.5 kg (126 lb 12.8 oz) IBW/kg (Calculated) : 57  Temp (24hrs), Avg:97.8 F (36.6 C), Min:97.6 F (36.4 C), Max:98 F (36.7 C)  Recent Labs  Lab 03/13/23 1030 03/14/23 1008 03/15/23 1027 03/15/23 1932  WBC 7.0 10.1 10.8* 13.0*  CREATININE 0.87 0.96 0.74  --     Estimated Creatinine Clearance: 45.4 mL/min (by C-G formula based on SCr of 0.74 mg/dL).    No Known Allergies  Antimicrobials this admission: Unasyn 8/28 >>   Microbiology results: 8/28 Sputum:   8/28 MRSA PCR:   Thank you for allowing pharmacy to be a part of this patient's care.  Arabella Merles, PharmD. Clinical Pharmacist 03/15/2023 10:39 PM

## 2023-03-15 NOTE — Progress Notes (Signed)
PROGRESS NOTE    Sarah Wolf  BJY:[id number] DOB: 1934-11-10 DOA: 03/13/2023 PCP: Tally Joe, MD    Brief Narrative:   Sarah Wolf is a 88 y.o. female with past medical history significant for HTN, paroxysmal atrial fibrillation, history of DVT no longer on anticoagulation who presents to [facility] ED on 8/26 with complaint of bilateral lower extremity pain, greatest on right.  Onset morning of admission, pain all the way down her right leg severe nature.  Review of records note patient with left lower extremity DVT 2020 as well as echo noting A-fib at that time.  She was on Xarelto temporarily after being found to have a LLE DVT following a fall with femur fracture 03/08/2019.  At baseline lives alone, ambulatory with a cane.  Patient denied chest pain, no palpitations, no shortness of breath, no cough, no nausea/vomiting/diarrhea, no lower extremity edema.  And route with EMS patient was noted to be in A-fib with RVR.  In the emergency department patient was noted to be afebrile with pulse elevated up to 145 and atrial fibrillation, systolic blood pressures noted to be elevated into the 160s., and O2 saturations currently maintained on 1.5 L nasal cannula oxygen.  Labs noted sodium 133, potassium 3.5, chloride 93, TSH 2.94 , BNP 799.9, and high-sensitivity troponins 55->57.  Doppler ultrasounds of the lower extremity did not note any signs of a DVT, but did note occlusion of the proximal to mid SFA.  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  As a surgery have been formally consulted.  Patient had been started on heparin drip and given metoprolol IV intermittently.  Cardiology and vascular surgery were consulted.  TRH  consulted for admission for further evaluation and management of A-fib with RVR and acute occlusion of right SFA and left popliteal artery.  Assessment & Plan:   Critical limb ischemia right lower extremity likely secondary to cardioembolic event Pain of pain to lower legs, acutely.  CT angiogram notable for acute occlusion right SFA and left popliteal artery.  Vascular surgery was consulted and patient underwent right lower extremity angiogram/aortogram with initiation of thrombolysis. -- Vascular surgery following, appreciate assistance -- Heparin drip  Paroxysmal atrial fibrillation with RVR Previously noted to be in A-fib in 2020, but not on anticoagulation.  Given new embolic event, now will likely need to be on lifelong anticoagulation. -- Cardiology following, appreciate assistance -- Metoprolol tartrate 25 mg p.o. twice daily -- Diltiazem drip -- Heparin drip; plan for DOAC prior to discharge -- Need to avoid cardioversion in the setting of possible RAA thrombus -- Continue to monitor on telemetry  Systolic congestive heart failure TTE with LVEF 40-45%, endocardial border not optimally defined to evaluate for regional wall motion abnormalities. -- Weaning diltiazem drip, started on metoprolol tartrate 25 mg p.o. twice daily  Hx DVT -- Heparin drip  Lesion of mediastinum Noted incidental finding on CT chest of nodule anterior mediastinum --Outpatient follow-up with CT surgery   DVT prophylaxis: Heparin drip    Code Status: Full Code Family Communication: Updated family members present at bedside this morning  Disposition Plan:  Level of care: Telemetry Cardiac Status is: Inpatient Remains inpatient appropriate because: Pending aortogram, heparin/diltiazem drip    Consultants:  Cardiology Vascular surgery  Procedures:  Angiogram/aortogram, Dr. Sherral Hammers; vascular surgery 8/28  Antimicrobials:  None   Subjective: Patient seen examined bedside, resting  comfortably.  Lying in bed.  No pain to lower extremities at rest.  Pending angio/aortogram later this morning.  Patient reporting her voice is "hoarse".  No other specific questions or concerns at this time.  Denies headache, no dizziness, no chest pain, no palpitations, no shortness of breath, no abdominal pain, no fever/chills/night sweats, no nausea/vomiting/diarrhea, no focal weakness, no fatigue, no paresthesias.  No acute events overnight per nursing staff.  Objective: Vitals:   03/15/23 1207 03/15/23 1212 03/15/23 1217 03/15/23 1222  BP: 114/68 114/68    Pulse: 65 (!) 0 (!) 0 (!) 0  Resp: 14     Temp:      TempSrc:      SpO2:      Weight:      Height:        Intake/Output Summary (Last 24 hours) at 03/15/2023 1507 Last data filed at 03/15/2023 2458 Gross per 24 hour  Intake --  Output 300 ml  Net -300 ml   Filed Weights   03/13/23 1024 03/14/23 0500 03/15/23 0622  Weight: 63.5 kg 59.6 kg 57.5 kg    Examination:  Physical Exam: GEN: NAD, alert and oriented x 3, wd/wn HEENT: NCAT, PERRL, EOMI, sclera clear, MMM PULM: CTAB w/o wheezes/crackles, normal respiratory effort, on room air CV: irregularly irregular rhythm, normal rate, w/o M/G/R GI: abd soft, NTND, NABS, no R/G/M MSK: no peripheral edema, moves all extremities independently, right lower extremity slightly cool to touch, sensation to light touch intact NEURO: CN II-XII intact, no focal deficits, sensation to light touch intact PSYCH: normal mood/affect Integumentary: dry/intact, no rashes or wounds    Data Reviewed: I have personally reviewed following labs and imaging studies  CBC: Recent Labs  Lab 03/13/23 1030 03/14/23 1008 03/15/23 1027  WBC 7.0 10.1 10.8*  NEUTROABS 5.1  --   --   HGB 14.6 14.0 12.1  HCT 45.1 42.8 37.7  MCV 98.5 95.1 96.2  PLT 181 177 148*   Basic Metabolic Panel: Recent Labs  Lab 03/13/23 1030 03/13/23 1559 03/14/23 1008 03/15/23 1027  NA 133*  --  133* 135  K 3.5  --   3.9 3.2*  CL 93*  --  94* 98  CO2 27  --  26 28  GLUCOSE 140*  --  125* 102*  BUN 14  --  22 16  CREATININE 0.87  --  0.96 0.74  CALCIUM 9.1  --  8.8* 8.4*  MG  --  2.2  --   --    GFR: Estimated Creatinine Clearance: 45.4 mL/min (by C-G formula based on SCr of 0.74 mg/dL). Liver Function Tests: Recent Labs  Lab 03/13/23 1030 03/15/23 1027  AST 28 27  ALT 19 22  ALKPHOS 60 44  BILITOT 1.3* 1.4*  PROT 7.0 5.3*  ALBUMIN 3.6 2.7*   No results for input(s): "LIPASE", "AMYLASE" in the last 168 hours. No results for input(s): "AMMONIA" in the last 168 hours. Coagulation Profile: Recent Labs  Lab 03/13/23 1030  INR 1.2   Cardiac Enzymes: No results for input(s): "CKTOTAL", "CKMB", "CKMBINDEX", "TROPONINI" in the last 168 hours. BNP (last 3 results) No results for input(s): "PROBNP" in the last 8760 hours. HbA1C: Recent Labs    03/15/23 1027  HGBA1C 6.6*   CBG: Recent Labs  Lab 03/13/23 1734 03/13/23 2033 03/14/23 0753  GLUCAP 181* 166* 139*  Lipid Profile: Recent Labs    03/13/23 1559  CHOL 153  HDL 47  LDLCALC 98  TRIG 40  CHOLHDL 3.3   Thyroid Function Tests: Recent Labs    03/13/23 1030  TSH 2.940   Anemia Panel: No results for input(s): "VITAMINB12", "FOLATE", "FERRITIN", "TIBC", "IRON", "RETICCTPCT" in the last 72 hours. Sepsis Labs: No results for input(s): "PROCALCITON", "LATICACIDVEN" in the last 168 hours.  Recent Results (from the past 240 hour(s))  SARS Coronavirus 2 by RT PCR (hospital order, performed in [facility] hospital lab) *cepheid single result test* Anterior Nasal Swab     Status: None   Collection Time: 03/14/23  5:40 PM   Specimen: Anterior Nasal Swab  Result Value Ref Range Status   SARS Coronavirus 2 by RT PCR NEGATIVE NEGATIVE Final    Comment: Performed at [facility], [address], [place], [place] [address]         Radiology Studies: PERIPHERAL VASCULAR CATHETERIZATION  Result Date:  03/15/2023 Images from the original result were not included. Patient name: Shamira Chrisco MRN: [id number] DOB: [birth year] Sex: female 03/15/2023 Pre-operative Diagnosis: [place] 1 acute limb ischemia in the right leg Post-operative diagnosis:  Same Surgeon:  Victorino Sparrow, MD Procedure Performed: 1.  Ultrasound-guided micropuncture access of the left common femoral artery 2.  Aortogram 3.  Second-order cannulation, right lower extremity angiogram 4.  Third cannulation, right lower extremity angiogram 5.  Initiation of thrombolysis therapy through a 50 cm UniFuse catheter spanning from the common femoral artery through the popliteal artery. 6.  Moderate sedation time 33 minutes, contrast volume 60 mL Indications: Patient is an 88 year old female with recent diagnosis of atrial fibrillation who presented with bilateral lower extremity pain which abated with heparinization.  CTA demonstrated occluded superficial femoral artery in the right lower extremity with significant atherosclerotic disease.  ABI demonstrated no toe pressure in the right lower extremity.  Right foot pain returned.  Discussed right lower extremity angiogram with possible intervention in an effort to improve distal perfusion to alleviate rest pain. Findings: Aortogram: Bilateral renal arteries patent.  No flow-limiting stenosis in the aortoiliac segments bilaterally On the right: Widely patent common femoral artery, widely patent profunda with a branch occluded from thrombus.  Superficial femoral artery is patent for 4 cm prior to occlusion.  There is reconstitution of the mid SFA continuing into the P1 segment of the popliteal artery.  The P2 P3 segment of the popliteal artery are occluded.  The ostia of the anterior tibial artery is occluded with reconstitution of collaterals at the mid tibia.  No outflow into the dorsalis pedis.  The peroneal artery appears to reconstitute via collaterals with no outflow, and the posterior tibial artery appears to  continue to the foot with minimal outflow in the plantar arteries.  The plantar arteries appear to be trashed from embolic debris.  Procedure:  The patient was identified in the holding area and taken to room 8.  The patient was then placed supine on the table and prepped and draped in the usual sterile fashion.  A time out was called.  Ultrasound was used to evaluate the left common femoral artery.  It was patent .  A digital ultrasound image was acquired.  A micropuncture needle was used to access the right common femoral artery under ultrasound guidance.  An 018 wire was advanced without resistance and a micropuncture sheath was placed.  The 018 wire was removed and a benson wire was placed.  The  micropuncture sheath was exchanged for a 5 french sheath.  An omniflush catheter was advanced over the wire to the level of L-1.  An abdominal angiogram was obtained.  Next, using the omniflush catheter and a benson wire, the aortic bifurcation was crossed and the catheter was placed into theright external iliac artery and right runoff was obtained.  See results above. 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  Being that this cardioembolic event was recent, I felt Chastidy would be best treated with thrombolytic therapy.  A 50 cm UniFuse catheter was brought onto the field and parked in the distal portion of the P3 segment of the popliteal artery.  Angiography followed from the distal aspect demonstrating single-vessel posterior tibial outflow to the level of the ankle with no flow in the foot.  I elected to proceed with initiation of thrombolysis therapy. 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  This pathology has a high rate of limb loss.  Being that this cardioembolic event was recent, and Lylla has no history of recent fall, bleed, surgery, brain malignancy, Evanna would be best treated with thrombolytic  therapy.  This was initiated at 1 mg/h Fara Olden, MD [facility] Office: [phone]   VAS [place] ABI WITH/WO TBI  Result Date: 03/14/2023  LOWER EXTREMITY DOPPLER STUDY Patient Name:  CARIYAH FERDINAND  Date of Exam:   03/14/2023 Medical Rec #: [id number]   Accession #:    [id number] Date of Birth: [birth year]   Patient Gender: F Patient Age:   21 years Exam Location:  [facility] Procedure:      VAS [place] ABI WITH/WO TBI Referring Phys: Lemar Livings --------------------------------------------------------------------------------  Indications: SFA occlusion found incidentally on venous examination for right              leg pain yesterday. Other Factors: Atrial fibrillation.  Vascular Interventions: No prior interventions. Limited prior history. Provoked                         right peroneal DVT s/p surgery in 2020. Limitations: Today's exam was limited due to involuntary patient movement. Comparison Study: CTA aorta bifemoral performed yesterday for finding of SFA                   occlusion on venous ultrasound.                   "Occlusion right superficial femoral artery beginning several                   cm beyond the origin in extending into the distal thigh.                   Complete occlusion of the left popliteal artery beginning at                   the P3 segment and extending through the trifurcation." Performing Technologist: Jean Rosenthal RDMS RVT  Examination Guidelines: A complete evaluation includes at minimum, Doppler waveform signals and systolic blood pressure reading at the level of bilateral brachial, anterior tibial, and posterior tibial arteries, when vessel segments are accessible. Bilateral testing is considered an integral part of a complete examination. Photoelectric Plethysmograph (PPG) waveforms and toe systolic pressure readings are included as required and additional duplex testing as needed. Limited examinations for reoccurring indications may be  performed as noted.  ABI Findings: +---------+------------------+-----+---------+--------+ Right    Rt Pressure (mmHg)IndexWaveform Comment  +---------+------------------+-----+---------+--------+ Brachial 137                    triphasic         +---------+------------------+-----+---------+--------+ DP       0                 0.00 absent            +---------+------------------+-----+---------+--------+ Great Toe0                 0.00 Absent            +---------+------------------+-----+---------+--------+ +---------+------------------+-----+----------+-------+ Left     Lt Pressure (mmHg)IndexWaveform  Comment +---------+------------------+-----+----------+-------+ Brachial 133                    triphasic         +---------+------------------+-----+----------+-------+ PTA      77                0.56 monophasic        +---------+------------------+-----+----------+-------+ DP       69                0.50 monophasic        +---------+------------------+-----+----------+-------+ Great Toe21                0.15 Abnormal          +---------+------------------+-----+----------+-------+  Summary: Right: Resting right ankle-brachial index indicates critical limb ischemia. Left: Resting left ankle-brachial index indicates moderate left lower extremity arterial disease. The left toe-brachial index is abnormal. *See table(s) above for measurements and observations.  Electronically signed by Waverly Ferrari MD on 03/14/2023 at 3:05:09 PM.    Final    VAS [place] LOWER EXTREMITY ARTERIAL DUPLEX  Result Date: 03/14/2023 LOWER EXTREMITY ARTERIAL DUPLEX STUDY Patient Name:  TENITA ORTH  Date of Exam:   03/14/2023 Medical Rec #: [id number]   Accession #:    [id number] Date of Birth: Jul 03, 1935   Patient Gender: F Patient Age:   64 years Exam [facility] Procedure:      VAS [place] LOWER EXTREMITY ARTERIAL DUPLEX Referring Phys: Lemar Livings  --------------------------------------------------------------------------------  Indications: SFA occlusion found incidentally on venous examination for right              leg pain yesterday. Other Factors: Atrial fibrillation.  Vascular Interventions: No prior interventions. Limited prior history. Provoked                         right peroneal DVT s/p surgery in 2020. Current ABI:            RT 0.0/0.0, LT 0.56/0.15 Limitations: Involuntary patient movement. Comparison Study: CTA aorta bifemoral performed yesterday for finding of SFA                   occlusion.                   "Occlusion right superficial femoral artery beginning several                   cm beyond the origin in extending into the distal thigh.                   Complete occlusion of the left popliteal artery beginning at  the P3 segment and extending through the trifurcation." Performing Technologist: Jean Rosenthal RDMS, RVT  Examination Guidelines: A complete evaluation includes B-mode imaging, spectral Doppler, color Doppler, and power Doppler as needed of all accessible portions of each vessel. Bilateral testing is considered an integral part of a complete examination. Limited examinations for reoccurring indications may be performed as noted.  +-----------+--------+-----+--------+-------------------+----------------------+ RIGHT      PSV cm/sRatioStenosisWaveform           Comments               +-----------+--------+-----+--------+-------------------+----------------------+ CFA Prox   78                   triphasic                                 +-----------+--------+-----+--------+-------------------+----------------------+ CFA Mid    70                   triphasic                                 +-----------+--------+-----+--------+-------------------+----------------------+ CFA Distal 29                   biphasic                                   +-----------+--------+-----+--------+-------------------+----------------------+ DFA        102                  triphasic                                 +-----------+--------+-----+--------+-------------------+----------------------+ SFA Prox   13                   monophasic         pre-occlusive          +-----------+--------+-----+--------+-------------------+----------------------+ SFA Mid                 occluded                                          +-----------+--------+-----+--------+-------------------+----------------------+ SFA Distal 24                   monophasic         reconstituted via                                                         collateral             +-----------+--------+-----+--------+-------------------+----------------------+ POP Prox                                           Unable to obtain  signal- venous                                                            interference and                                                          patient movement       +-----------+--------+-----+--------+-------------------+----------------------+ POP Mid    14                   monophasic                                +-----------+--------+-----+--------+-------------------+----------------------+ POP Distal 7                    dampened monophasic                       +-----------+--------+-----+--------+-------------------+----------------------+ ATA Prox   8                    dampened monophasic                       +-----------+--------+-----+--------+-------------------+----------------------+ ATA Mid    6                    dampened monophasic                       +-----------+--------+-----+--------+-------------------+----------------------+ ATA Distal                                         Unable to obtain                                                           signal                 +-----------+--------+-----+--------+-------------------+----------------------+ PTA Prox   17                   dampened monophasic                       +-----------+--------+-----+--------+-------------------+----------------------+ PTA Mid    6                    dampened monophasic                       +-----------+--------+-----+--------+-------------------+----------------------+ PTA Distal 4                    dampened monophasic                       +-----------+--------+-----+--------+-------------------+----------------------+ PERO Prox  Unable to obtain                                                          signal                 +-----------+--------+-----+--------+-------------------+----------------------+ PERO Mid                                           Unable to obtain                                                          signal                 +-----------+--------+-----+--------+-------------------+----------------------+ PERO Distal                                        Unable to obtain                                                          signal                 +-----------+--------+-----+--------+-------------------+----------------------+ DP                                                 Unable to obtain                                                          signal                 +-----------+--------+-----+--------+-------------------+----------------------+  +-----------+--------+-----+--------+-------------------+----------------------+ LEFT       PSV cm/sRatioStenosisWaveform           Comments               +-----------+--------+-----+--------+-------------------+----------------------+ CFA Prox   104                  triphasic                                  +-----------+--------+-----+--------+-------------------+----------------------+ CFA Mid    82                   triphasic                                 +-----------+--------+-----+--------+-------------------+----------------------+ CFA Distal  76                   triphasic                                 +-----------+--------+-----+--------+-------------------+----------------------+ DFA        52                   triphasic                                 +-----------+--------+-----+--------+-------------------+----------------------+ SFA Prox   60                   triphasic                                 +-----------+--------+-----+--------+-------------------+----------------------+ SFA Mid    69                   triphasic                                 +-----------+--------+-----+--------+-------------------+----------------------+ SFA Distal 102                  triphasic                                 +-----------+--------+-----+--------+-------------------+----------------------+ POP Prox   71                   triphasic                                 +-----------+--------+-----+--------+-------------------+----------------------+ POP Mid    8                    monophasic         Pre-occlusive          +-----------+--------+-----+--------+-------------------+----------------------+ POP Distal              occluded                                          +-----------+--------+-----+--------+-------------------+----------------------+ ATA Prox   15                   monophasic                                +-----------+--------+-----+--------+-------------------+----------------------+ ATA Mid    9                    monophasic                                +-----------+--------+-----+--------+-------------------+----------------------+ ATA Distal 12                   monophasic                                 +-----------+--------+-----+--------+-------------------+----------------------+  PTA Prox                occluded                   Reconstituted via                                                         collateral             +-----------+--------+-----+--------+-------------------+----------------------+ PTA Mid    21                   monophasic                                +-----------+--------+-----+--------+-------------------+----------------------+ PTA Distal 19                   monophasic                                +-----------+--------+-----+--------+-------------------+----------------------+ PERO Prox                                          Unable to obtain                                                          signal                 +-----------+--------+-----+--------+-------------------+----------------------+ PERO Mid   9                    dampened monophasic                       +-----------+--------+-----+--------+-------------------+----------------------+ PERO Distal5                    dampened monophasic                       +-----------+--------+-----+--------+-------------------+----------------------+ DP         12                   monophasic                                +-----------+--------+-----+--------+-------------------+----------------------+  Summary: Right: Total occlusion noted in the superficial femoral artery. Reconstitution of the distal SFA via collateral. Dampened flow noted in the posterior tibial and anterior tibial ateries. Unable to detect flow in the peroneal artery. Left: Total occlusion noted in the popliteal artery. Reconstitution of the proximal PTA via collateral. Monophasic flow noted throughout the anterior tibial and peroneal arteries.  See table(s) above for measurements and observations. Electronically signed by Waverly Ferrari MD on 03/14/2023 at 3:04:15 PM.    Final     ECHOCARDIOGRAM COMPLETE  Result Date: 03/14/2023    ECHOCARDIOGRAM REPORT   Patient Name:  Gardenia Adolph Date of Exam: 03/14/2023 Medical Rec #:  [id number]  Height:       65.0 in Accession #:    [id number] Weight:       131.4 lb Date of Birth:  [birth year]  BSA:          1.655 m Patient Age:    88 years   BP:           153/80 mmHg Patient Gender: F          HR:           100 bpm. Exam Location:  Inpatient Procedure: 2D Echo, Cardiac Doppler and Color Doppler Indications:    Arhythmia  History:        Patient has prior history of Echocardiogram examinations, most                 recent 03/09/2019. Arrythmias:Atrial Fibrillation.  Sonographer:    Darlys Gales Referring Phys: [id number] RONDELL A SMITH IMPRESSIONS  1. Left ventricular ejection fraction, by estimation, is 40 to 45%. The left ventricle has mildly decreased function. Left ventricular endocardial border not optimally defined to evaluate regional wall motion. Left ventricular diastolic function could not be evaluated.  2. Right ventricular systolic function was not well visualized. The right ventricular size is dilated.  3. A small pericardial effusion is present. The pericardial effusion is surrounding the apex.  4. The mitral valve is grossly normal. Trivial mitral valve regurgitation. No evidence of mitral stenosis.  5. The aortic valve was not well visualized. Aortic valve regurgitation is not visualized.  6. Cannot exclude a small PFO.  7. Technically difficult study. Comparison(s): Unable to view prior study. LVEF decreased from prior reporting. FINDINGS  Left Ventricle: Left ventricular ejection fraction, by estimation, is 40 to 45%. The left ventricle has mildly decreased function. Left ventricular endocardial border not optimally defined to evaluate regional wall motion. The left ventricular internal cavity size was normal in size. There is no left ventricular hypertrophy. Left ventricular diastolic function could not be evaluated due to atrial  fibrillation. Left ventricular diastolic function could not be evaluated. Right Ventricle: The right ventricular size is dilated. No increase in right ventricular wall thickness. Right ventricular systolic function was not well visualized. Left Atrium: Left atrial size was normal in size. Right Atrium: Right atrial size was normal in size. Pericardium: A small pericardial effusion is present. The pericardial effusion is surrounding the apex. Mitral Valve: The mitral valve is grossly normal. Trivial mitral valve regurgitation. No evidence of mitral valve stenosis. Tricuspid Valve: The tricuspid valve is not well visualized. Tricuspid valve regurgitation is not demonstrated. Aortic Valve: The aortic valve was not well visualized. Aortic valve regurgitation is not visualized. Pulmonic Valve: The pulmonic valve was not well visualized. Pulmonic valve regurgitation is not visualized. No evidence of pulmonic stenosis. Aorta: The aortic root and ascending aorta are structurally normal, with no evidence of dilitation. IAS/Shunts: Cannot exclude a small PFO.  LEFT VENTRICLE PLAX 2D LVIDd:         3.70 cm   Diastology LVIDs:         2.80 cm   LV e' medial:    3.70 cm/s LV PW:         0.90 cm   LV E/e' medial:  26.1 LV IVS:        0.70 cm   LV e' lateral:   5.55 cm/s LVOT diam:     1.90 cm   LV E/e' lateral:  17.4 LV SV:         41 LV SV Index:   25 LVOT Area:     2.84 cm  LEFT ATRIUM             Index        RIGHT ATRIUM           Index LA Vol (A2C):   44.7 ml 27.01 ml/m  RA Area:     13.40 cm LA Vol (A4C):   50.7 ml 30.64 ml/m  RA Volume:   28.80 ml  17.41 ml/m LA Biplane Vol: 49.0 ml 29.61 ml/m  AORTIC VALVE LVOT Vmax:   94.80 cm/s LVOT Vmean:  62.600 cm/s LVOT VTI:    0.145 m MITRAL VALVE               TRICUSPID VALVE MV Area (PHT): 4.60 cm    TR Peak grad:   13.7 mmHg MV Decel Time: 165 msec    TR Vmax:        185.00 cm/s MV E velocity: 96.70 cm/s                            SHUNTS                            Systemic  VTI:  0.14 m                            Systemic Diam: 1.90 cm Riley Lam MD Electronically signed by Riley Lam MD Signature Date/Time: 03/14/2023/11:56:26 AM    Final         Scheduled Meds:  [MAR Hold] feeding supplement  237 mL Oral BID BM   [MAR Hold] metoprolol tartrate  25 mg Oral BID   [MAR Hold] pneumococcal 20-valent conjugate vaccine  0.5 mL Intramuscular Tomorrow-1000   [MAR Hold] sodium chloride flush  3 mL Intravenous Q12H   sodium chloride flush  3 mL Intravenous Q12H   Continuous Infusions:  sodium chloride 50 mL/hr at 03/15/23 2725   sodium chloride     alteplase (LIMB ISCHEMIA) 10 mg in normal saline (0.02 mg/mL) infusion 1 mg/hr (03/15/23 1223)   [MAR Hold] diltiazem (CARDIZEM) infusion 7.5 mg/hr (03/15/23 1025)   heparin Stopped (03/15/23 1048)   heparin 800 Units/hr (03/15/23 1224)     LOS: 2 days    Time spent: 52 minutes spent on chart review, discussion with nursing staff, consultants, updating family and interview/physical exam; more than 50% of that time was spent in counseling and/or coordination of care.    Alvira Philips [place], DO [facility] Available via Epic secure chat 7am-7pm After these hours, please refer to coverage provider listed on [email or web address] 03/15/2023, 3:07 PM

## 2023-03-15 NOTE — IPAL (Signed)
  Interdisciplinary Goals of Care Family Meeting   Date carried out: 03/15/2023  Location of the meeting: Bedside  Member's involved: Bedside Registered Nurse, Family Member or next of kin, and Other: PA-C  Durable Power of [employer] or acting medical decision maker: Nephew Bud    Discussion: We discussed goals of care for [name]  .  Nephew Bud and his daughter Baron Hamper at bedside arrived shortly after patient Sarah Wolf got intubated. Given overall prognosis of patient and her age, family would like to make patient DNR w/ no compressions. They are okay for now with continuing supportive care w/ ventilator and pressors. They are okay with shocks if necessary. DNR order placed.  Code status:   Code Status: Do not attempt resuscitation (DNR) PRE-ARREST INTERVENTIONS DESIRED   Disposition: Continue current acute care  Time spent for the meeting: 35 minutes    Lidia Collum, PA-C  03/15/2023, 11:24 PM

## 2023-03-16 ENCOUNTER — Encounter (HOSPITAL_COMMUNITY): Payer: Self-pay | Admitting: Vascular Surgery

## 2023-03-16 ENCOUNTER — Inpatient Hospital Stay (HOSPITAL_COMMUNITY): Payer: Medicare Other

## 2023-03-16 ENCOUNTER — Inpatient Hospital Stay (HOSPITAL_COMMUNITY)
Admission: EM | Disposition: A | Discharge status: Skilled Nursing Facility | Payer: Self-pay | Source: Home / Self Care | Attending: Internal Medicine

## 2023-03-16 ENCOUNTER — Encounter (HOSPITAL_COMMUNITY)
Admission: EM | Disposition: A | Discharge status: Skilled Nursing Facility | Payer: Self-pay | Source: Home / Self Care | Attending: Internal Medicine

## 2023-03-16 DIAGNOSIS — I4891 Unspecified atrial fibrillation: Secondary | ICD-10-CM | POA: Diagnosis not present

## 2023-03-16 DIAGNOSIS — I82491 Acute embolism and thrombosis of other specified deep vein of right lower extremity: Secondary | ICD-10-CM | POA: Diagnosis not present

## 2023-03-16 DIAGNOSIS — I70221 Atherosclerosis of native arteries of extremities with rest pain, right leg: Secondary | ICD-10-CM | POA: Diagnosis not present

## 2023-03-16 DIAGNOSIS — J9601 Acute respiratory failure with hypoxia: Secondary | ICD-10-CM | POA: Diagnosis not present

## 2023-03-16 HISTORY — PX: PERIPHERAL VASCULAR INTERVENTION: CATH118257

## 2023-03-16 HISTORY — PX: PERIPHERAL VASCULAR THROMBECTOMY: CATH118306

## 2023-03-16 LAB — CBC
HCT: 41.5 % (ref 36.0–46.0)
HCT: 35.2 % — ABNORMAL LOW (ref 36.0–46.0)
Hemoglobin: 13.2 g/dL (ref 12.0–15.0)
Hemoglobin: 11.3 g/dL — ABNORMAL LOW (ref 12.0–15.0)
MCH: 31.8 pg (ref 26.0–34.0)
MCH: 32.1 pg (ref 26.0–34.0)
MCHC: 31.8 g/dL (ref 30.0–36.0)
MCHC: 32.1 g/dL (ref 30.0–36.0)
MCV: 100 fL (ref 80.0–100.0)
MCV: 100 fL (ref 80.0–100.0)
Platelets: 135 10*3/uL — ABNORMAL LOW (ref 150–400)
Platelets: 118 K/uL — ABNORMAL LOW (ref 150–400)
RBC: 4.15 MIL/uL (ref 3.87–5.11)
RBC: 3.52 MIL/uL — ABNORMAL LOW (ref 3.87–5.11)
RDW: 14.1 % (ref 11.5–15.5)
RDW: 14.2 % (ref 11.5–15.5)
WBC: 9.6 10*3/uL (ref 4.0–10.5)
WBC: 9.3 K/uL (ref 4.0–10.5)
nRBC: 0 % (ref 0.0–0.2)
nRBC: 0 % (ref 0.0–0.2)

## 2023-03-16 LAB — COMPREHENSIVE METABOLIC PANEL
ALT: 19 U/L (ref 0–44)
AST: 24 U/L (ref 15–41)
Albumin: 2.3 g/dL — ABNORMAL LOW (ref 3.5–5.0)
Alkaline Phosphatase: 37 U/L — ABNORMAL LOW (ref 38–126)
Anion gap: 13 (ref 5–15)
BUN: 21 mg/dL (ref 8–23)
CO2: 22 mmol/L (ref 22–32)
Calcium: 8.1 mg/dL — ABNORMAL LOW (ref 8.9–10.3)
Chloride: 101 mmol/L (ref 98–111)
Creatinine, Ser: 0.99 mg/dL (ref 0.44–1.00)
GFR, Estimated: 56 mL/min — ABNORMAL LOW (ref >60)
Glucose, Bld: 118 mg/dL — ABNORMAL HIGH (ref 70–99)
Potassium: 3.5 mmol/L (ref 3.5–5.1)
Sodium: 136 mmol/L (ref 135–145)
Total Bilirubin: 2.4 mg/dL — ABNORMAL HIGH (ref 0.3–1.2)
Total Protein: 4.9 g/dL — ABNORMAL LOW (ref 6.5–8.1)

## 2023-03-16 LAB — GLUCOSE, CAPILLARY
Glucose-Capillary: 100 mg/dL — ABNORMAL HIGH (ref 70–99)
Glucose-Capillary: 122 mg/dL — ABNORMAL HIGH (ref 70–99)
Glucose-Capillary: 124 mg/dL — ABNORMAL HIGH (ref 70–99)
Glucose-Capillary: 126 mg/dL — ABNORMAL HIGH (ref 70–99)
Glucose-Capillary: 131 mg/dL — ABNORMAL HIGH (ref 70–99)
Glucose-Capillary: 137 mg/dL — ABNORMAL HIGH (ref 70–99)
Glucose-Capillary: 165 mg/dL — ABNORMAL HIGH (ref 70–99)

## 2023-03-16 LAB — FIBRINOGEN
Fibrinogen: 570 mg/dL — ABNORMAL HIGH (ref 210–475)
Fibrinogen: 344 mg/dL (ref 210–475)

## 2023-03-16 LAB — POCT ACTIVATED CLOTTING TIME
Activated Clotting Time: 189 seconds
Activated Clotting Time: 238 seconds

## 2023-03-16 LAB — BASIC METABOLIC PANEL
Anion gap: 17 — ABNORMAL HIGH (ref 5–15)
BUN: 20 mg/dL (ref 8–23)
CO2: 21 mmol/L — ABNORMAL LOW (ref 22–32)
Calcium: 8.4 mg/dL — ABNORMAL LOW (ref 8.9–10.3)
Chloride: 100 mmol/L (ref 98–111)
Creatinine, Ser: 0.87 mg/dL (ref 0.44–1.00)
GFR, Estimated: >60 mL/min (ref >60)
Glucose, Bld: 130 mg/dL — ABNORMAL HIGH (ref 70–99)
Potassium: 3.8 mmol/L (ref 3.5–5.1)
Sodium: 138 mmol/L (ref 135–145)

## 2023-03-16 LAB — HEPARIN LEVEL (UNFRACTIONATED)
Heparin Unfractionated: 0.2 [IU]/mL — ABNORMAL LOW (ref 0.30–0.70)
Heparin Unfractionated: 0.18 [IU]/mL — ABNORMAL LOW (ref 0.30–0.70)

## 2023-03-16 LAB — AMMONIA: Ammonia: 42 umol/L — ABNORMAL HIGH (ref 9–35)

## 2023-03-16 LAB — MRSA NEXT GEN BY PCR, NASAL: MRSA by PCR Next Gen: NOT DETECTED

## 2023-03-16 LAB — MAGNESIUM
Magnesium: 1.7 mg/dL (ref 1.7–2.4)
Magnesium: 1.7 mg/dL (ref 1.7–2.4)

## 2023-03-16 LAB — PHOSPHORUS: Phosphorus: 2.4 mg/dL — ABNORMAL LOW (ref 2.5–4.6)

## 2023-03-16 SURGERY — PERIPHERAL VASCULAR THROMBECTOMY
Anesthesia: LOCAL | Laterality: Right

## 2023-03-16 SURGERY — INSERTION, CATHETER, FOR THROMBOLYSIS
Anesthesia: Choice

## 2023-03-16 MED ORDER — ONDANSETRON HCL 4 MG/2ML IJ SOLN
4.0000 mg | Freq: Four times a day (QID) | INTRAMUSCULAR | Status: DC | PRN
  Administered 2023-03-24: 4 mg via INTRAVENOUS
  Filled 2023-03-16 (×2): qty 2

## 2023-03-16 MED ORDER — HEPARIN (PORCINE) IN NACL 1000-0.9 UT/500ML-% IV SOLN
INTRAVENOUS | Status: DC | PRN
  Administered 2023-03-16: 500 mL

## 2023-03-16 MED ORDER — SODIUM CHLORIDE 0.9% FLUSH
3.0000 mL | Freq: Two times a day (BID) | INTRAVENOUS | Status: DC
  Administered 2023-03-16 – 2023-03-30 (×23): 3 mL via INTRAVENOUS

## 2023-03-16 MED ORDER — FENTANYL CITRATE (PF) 100 MCG/2ML IJ SOLN
INTRAMUSCULAR | Status: DC | PRN
  Administered 2023-03-16 (×2): 12.5 ug

## 2023-03-16 MED ORDER — ACETAMINOPHEN 325 MG PO TABS: 650.0000 mg | ORAL_TABLET | ORAL | Status: DC | PRN

## 2023-03-16 MED ORDER — SODIUM CHLORIDE 0.9% FLUSH
3.0000 mL | INTRAVENOUS | Status: DC | PRN
  Administered 2023-03-22 – 2023-03-27 (×2): 3 mL via INTRAVENOUS

## 2023-03-16 MED ORDER — SODIUM CHLORIDE 0.9 % IV SOLN: INTRAVENOUS | Status: AC

## 2023-03-16 MED ORDER — SODIUM CHLORIDE 0.9 % IV SOLN: INTRAVENOUS | Status: DC

## 2023-03-16 MED ORDER — ORAL CARE MOUTH RINSE: 15.0000 mL | OROMUCOSAL | Status: DC | PRN

## 2023-03-16 MED ORDER — MIDAZOLAM HCL 2 MG/2ML IJ SOLN: 1.0000 mg | INTRAMUSCULAR | Status: DC | PRN

## 2023-03-16 MED ORDER — FENTANYL CITRATE (PF) 100 MCG/2ML IJ SOLN
INTRAMUSCULAR | Status: AC
  Filled 2023-03-16: qty 2

## 2023-03-16 MED ORDER — ORAL CARE MOUTH RINSE
15.0000 mL | OROMUCOSAL | Status: DC
  Administered 2023-03-16 – 2023-03-17 (×6): 15 mL via OROMUCOSAL

## 2023-03-16 MED ORDER — SODIUM CHLORIDE 0.9 % IV SOLN: 250.0000 mL | INTRAVENOUS | Status: DC | PRN

## 2023-03-16 MED ORDER — HYDRALAZINE HCL 20 MG/ML IJ SOLN: 5.0000 mg | INTRAMUSCULAR | Status: DC | PRN

## 2023-03-16 MED ORDER — DEXMEDETOMIDINE HCL IN NACL 400 MCG/100ML IV SOLN
0.0000 ug/kg/h | INTRAVENOUS | Status: DC
  Administered 2023-03-16: 1 ug/kg/h via INTRAVENOUS
  Administered 2023-03-16: 0.8 ug/kg/h via INTRAVENOUS
  Administered 2023-03-16: 0.4 ug/kg/h via INTRAVENOUS
  Administered 2023-03-17: 0.8 ug/kg/h via INTRAVENOUS
  Filled 2023-03-16 (×4): qty 100

## 2023-03-16 MED ORDER — HEPARIN (PORCINE) 25000 UT/250ML-% IV SOLN
1250.0000 [IU]/h | INTRAVENOUS | Status: DC
  Administered 2023-03-16: 1050 [IU]/h via INTRAVENOUS
  Administered 2023-03-17: 1250 [IU]/h via INTRAVENOUS
  Filled 2023-03-16 (×2): qty 250

## 2023-03-16 MED ORDER — HEPARIN SODIUM (PORCINE) 1000 UNIT/ML IJ SOLN
INTRAMUSCULAR | Status: DC | PRN
  Administered 2023-03-16: 2000 [IU] via INTRAVENOUS
  Administered 2023-03-16: 3000 [IU] via INTRAVENOUS

## 2023-03-16 MED ORDER — HEPARIN SODIUM (PORCINE) 1000 UNIT/ML IJ SOLN
INTRAMUSCULAR | Status: AC
  Filled 2023-03-16: qty 10

## 2023-03-16 MED ORDER — POTASSIUM CHLORIDE 20 MEQ PO PACK
40.0000 meq | PACK | Freq: Once | ORAL | Status: AC
  Administered 2023-03-16: 40 meq
  Filled 2023-03-16: qty 2

## 2023-03-16 MED ORDER — MAGNESIUM SULFATE 2 GM/50ML IV SOLN
2.0000 g | Freq: Once | INTRAVENOUS | Status: AC
  Administered 2023-03-16: 2 g via INTRAVENOUS
  Filled 2023-03-16: qty 50

## 2023-03-16 MED ORDER — IODIXANOL 320 MG/ML IV SOLN
INTRAVENOUS | Status: DC | PRN
  Administered 2023-03-16: 60 mL

## 2023-03-16 MED ORDER — LIDOCAINE HCL (PF) 1 % IJ SOLN
INTRAMUSCULAR | Status: AC
  Filled 2023-03-16: qty 30

## 2023-03-16 MED ORDER — LABETALOL HCL 5 MG/ML IV SOLN
10.0000 mg | INTRAVENOUS | Status: DC | PRN
  Filled 2023-03-16: qty 4

## 2023-03-16 MED ORDER — ASPIRIN 81 MG PO CHEW
81.0000 mg | CHEWABLE_TABLET | Freq: Every day | ORAL | Status: DC
  Administered 2023-03-16 – 2023-03-19 (×3): 81 mg
  Filled 2023-03-16 (×3): qty 1

## 2023-03-16 MED ORDER — ATORVASTATIN CALCIUM 40 MG PO TABS
40.0000 mg | ORAL_TABLET | Freq: Every day | ORAL | Status: DC
  Administered 2023-03-16 – 2023-03-19 (×3): 40 mg
  Filled 2023-03-16 (×3): qty 1

## 2023-03-16 SURGICAL SUPPLY — 10 items
BALLN STERLING OTW 5X40X135 (BALLOONS) ×2
BALLOON STERLING OTW 5X40X135 (BALLOONS) IMPLANT
CLOSURE PERCLOSE PROSTYLE (VASCULAR PRODUCTS) IMPLANT
COVER DOME SNAP 22 D (MISCELLANEOUS) IMPLANT
KIT ENCORE 26 ADVANTAGE (KITS) IMPLANT
STENT ELUVIA 6X40X130 (Permanent Stent) IMPLANT
STENT VIABAHN 6X50X120 (Permanent Stent) IMPLANT
TRAY PV CATH (CUSTOM PROCEDURE TRAY) IMPLANT
WIRE BENTSON .035X145CM (WIRE) IMPLANT
WIRE G V18X300CM (WIRE) IMPLANT

## 2023-03-16 NOTE — Progress Notes (Signed)
ANTICOAGULATION CONSULT NOTE- Follow Up Pharmacy Consult for heparin Indication: atrial fibrillation   No Known Allergies  Patient Measurements: Height: 5\' 5"  (165.1 cm) Weight: 49.7 kg (109 lb 9.1 oz) IBW/kg (Calculated) : 57 Heparin Dosing Weight: TBW  Vital Signs: Temp: 98.4 F (36.9 C) (08/29 1125) Temp Source: Axillary (08/29 1125) BP: 110/73 (08/29 1430) Pulse Rate: 94 (08/29 1430)  Labs: Recent Labs    03/15/23 1027 03/15/23 1932 03/15/23 2344 03/16/23 0224 03/16/23 0809 03/16/23 1055  HGB 12.1 12.7 13.3 13.2 11.3*  --   HCT 37.7 39.5 39.0 41.5 35.2*  --   PLT 148* 153  --  135* 118*  --   HEPARINUNFRC 0.13* <0.10*  --  0.20*  --  0.18*  CREATININE 0.74  --   --   --  0.87 0.99    Estimated Creatinine Clearance: 32 mL/min (by C-G formula based on SCr of 0.99 mg/dL).  Assessment: 88 y.o. female with Afib w/ possible LAA thrombus and occlusion of LE artery on heparin. Now s/p catheter thrombolysis, R SFA stent x2, RLE arteriogram 8/29. Pharmacy to restart heparin 6hr after sheath pull.   Sheath pulled at 16:47. Patient received 5000 units of heparin in procedure and is only 50 kg.  Now off alteplase, return to normal heparin goal.   Goal of Therapy:  Heparin level: 0.3 -0.7 units/ml Monitor platelets by anticoagulation protocol: Yes   Plan:  Restart heparin infusion 1050 units/hr at 2300 Monitor daily heparin level, CBC, signs/symptoms of bleeding  F/u long term anticoagulation plan   Thank you for allowing pharmacy to participate in this patient's care,  Alphia Moh, PharmD, BCPS, [facility] Clinical Pharmacist  Please check AMION for all [facility] Pharmacy phone numbers After 10:00 PM, call Main Pharmacy [phone]

## 2023-03-16 NOTE — Progress Notes (Signed)
OT Cancellation Note  Patient Details Name: Sarah Wolf MRN: [id number] DOB: [birth year]   Cancelled Treatment:    Reason Eval/Treat Not Completed: Active bedrest order.  RN advised to wait until patient is extubated.  Will continue efforts as appropriate.    [name] [name] 03/16/2023, 9:32 AM 03/16/2023  RP, OTR/L  Acute Rehabilitation Services  Office:  [phone]

## 2023-03-16 NOTE — Progress Notes (Signed)
2120 pt became agitated and 02 sats dropped. Difficulty getting clear sat waveform. Called RT and Elink. Paged hospitalist. Started nonrebreather. Bilateral crackles and rhonchi. Chest xray ordered. 2135 pt answered all orientation questions. Intermittent agitation 2137 worsening agitation, pt tried to pull mask off, pt could not clear airway. Attempted oral suction. Versed given 2145 hospitalist and CCM ground team at bedside 2202 pt intubated for airway protection

## 2023-03-16 NOTE — Progress Notes (Signed)
NAMEChania Wolf, MRN:  [id number], DOB:  11-30-1934, LOS: 3 ADMISSION DATE:  03/13/2023, CONSULTATION DATE:  8/28 REFERRING MD:  hospitalist, CHIEF COMPLAINT:  Acute respiratory failure w/ hypoxia   History of Present Illness:  Patient is a 88 yo F w/ pertinent PMH PAF, HTN, DVT 2020 no longer on DOAC presents to [facility] on 8/26 w/ critical limb ischemia RLE.  Patient complaining of worsening BLE pain worse on right. Patient noted to be in afib RVR rates 140s. SBP 160s. BNP 799. DVT US no signs of dvt but occlusion of proximal mid SFA. 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 Vasc surgery and cards consulted. Started on heparin drip and given metoprolol. Started on diltiazem drip. TRH admitted to floor.   On 8/28 patient underwent thrombolysis of RLE. That evening patient with worsening encephalopathy. Had aspiration event and became more hypoxic and worsening AMS. PCCM consulted.  Pertinent  Medical History  PAF DVT 2020 no longer on [facility] HTN  Significant Hospital Events: Including procedures, antibiotic start and stop dates in addition to other pertinent events   8/26 admitted to [facility] 8/28 worsening encephalopathy and possible aspiration event leading to worsening hypoxia; pccm consulted and required intubation.   Interim History / Subjective:  Intubated overnight.  Waking up intermittently.   Objective   Blood pressure (!) 140/105, pulse 91, temperature 97.8 F (36.6 C), temperature source Axillary, resp. rate 20, height 5\' 5"  (1.651 m), weight 49.7 kg, SpO2 93%.    Vent Mode: PRVC FiO2 (%):  [40 %-100 %] 40 % Set Rate:  [20 bmp] 20 bmp Vt Set:  [460 mL] 460 mL PEEP:  [5 cmH20] 5 cmH20 Plateau Pressure:  [18 cmH20-21 cmH20] 20 cmH20   Intake/Output Summary (Last 24  hours) at 03/16/2023 0727 Last data filed at 03/16/2023 0600 Gross per 24 hour  Intake 1941.4 ml  Output 300 ml  Net 1641.4 ml   Filed Weights   03/14/23 0500 03/15/23 0622 03/16/23 0500  Weight: 59.6 kg 57.5 kg 49.7 kg    Examination:  General:  Critically ill appearing elderly female HEENT: [place]/AT, PERRL, ETT Neuro: RASS -2 CV: IRIR rate controlled, no MRG PULM:  Bibasilar coarse crackles.  GI: Soft, NT, ND Extremities: Bilateral lower extremities cool, pale Skin: no rashes or lesions   Labs, imaging 8/26 CTA chest, bifem: Mediastinal nodule, cardiomegaly, biatrial enlargement. Concern for LAA thrombus. Complete occlusion of the L popliteal artery. Occlusion right SFA. Multiple areas of less critical arterial stenosis.  8/28 CT head: chronic changes.   WBC 9.6, Hgb 13, Plt 135, Fibrinogen 570,    Resolved Hospital Problem list     Assessment & Plan:   Acute respiratory failure w/ hypoxia Probable aspiration: trach aspirate growing few GPC > MRSA PCR negative - Full vent support -Wean PEEP/FiO2 for SpO2 >92% -VAP bundle in place -Daily SAT and SBT -PAD protocol in place. Precedex and PRN fentanyl for RASS goal -1 to -2.  -trach aspirate pending -unasyn for asp ppx  Acute encephalopathy: CT head negative. Hypoxia, hypercarbia likely contributing factors -delirium precautions - Daily WUA - Sedation as above - repeat chemistry - check ammonia  Hypotension post procedure Plan: -levo for map goal >65 -cardene and metoprolol dc'd  Critical limb ischemia right lower extremity -vascular surgery following -cont heparin and tpa -Back  to OR today for re-exploration.   PAF RVR Hypokalemia Plan: -cards following -tele monitoring -heparin -hold metoprolol and cardene w/ hypotension  Systolic CHF chronic HTN -echo lvef 40-45% Plan: -HF following -currently hypotensive so GDMT on hold for now -daily weights; strict I/o's  DMT2 -A1c this admission  6.6 Plan: -ssi and cbg monitoring  Hx of dvt Plan: -heparin  Lesion of mediastinum -incidental finding on CT chest of nodule on anterior mediastinum Plan: -f/u outpt   Family updated at bedside.   Best Practice (right click and "Reselect all SmartList Selections" daily)   Diet/type: NPO DVT prophylaxis: systemic heparin GI prophylaxis: PPI Lines: N/A Foley:  N/A Code Status:  full code Last date of multidisciplinary goals of care discussion [8/28 family updated by Dr. Delia Chimes that patient is requiring intubation and is okay with [place] to proceed]   Critical care time: 42 minutes      Joneen Roach, AGACNP-BC [facility]  See Amion for personal pager PCCM on call pager [phone] until 7pm. Please call Elink 7p-7a. [phone]  03/16/2023 7:52 AM

## 2023-03-16 NOTE — Progress Notes (Signed)
ANTICOAGULATION CONSULT NOTE- Follow Up Pharmacy Consult for heparin Indication: atrial fibrillation   No Known Allergies  Patient Measurements: Height: 5\' 5"  (165.1 cm) Weight: 57.5 kg (126 lb 12.8 oz) IBW/kg (Calculated) : 57 Heparin Dosing Weight: TBW  Vital Signs: Temp: 98 F (36.7 C) (08/28 1600) Temp Source: Oral (08/28 1600) BP: 94/69 (08/29 0215) Pulse Rate: 83 (08/29 0230)  Labs: Recent Labs    03/13/23 1030 03/13/23 1235 03/13/23 1439 03/13/23 2226 03/14/23 1008 03/15/23 1027 03/15/23 1932 03/15/23 2344 03/16/23 0224  HGB 14.6  --   --   --  14.0 12.1 12.7 13.3 13.2  HCT 45.1  --   --   --  42.8 37.7 39.5 39.0 41.5  PLT 181  --   --   --  177 148* 153  --  135*  APTT  --   --  37*  --   --   --   --   --   --   LABPROT 15.1  --   --   --   --   --   --   --   --   INR 1.2  --   --   --   --   --   --   --   --   HEPARINUNFRC  --   --   --    < > 0.37 0.13* <0.10*  --  0.20*  CREATININE 0.87  --   --   --  0.96 0.74  --   --   --   TROPONINIHS 55* 57*  --   --   --   --   --   --   --    < > = values in this interval not displayed.    Estimated Creatinine Clearance: 45.4 mL/min (by C-G formula based on SCr of 0.74 mg/dL).  Assessment: 88 y.o. female with Afib w/  possible LAA thrombus and occlusion of LE artery on heparin. Pharmacy dosing heparin  S/p Aortogram w/tPA. Heparin restarted post procedure with tPA infusion. Level came back undetectable this PM. No further bleeding. Will increase rate and check in 6 hr.   8/29 AM: heparin level returned at 0.2 on 950 units/hr (therapeutic). Per RN, no signs/symptoms of bleeding or issues with the heparin infusion running continuously. Hgb stable, plts 153>135.  Goal of Therapy:  Heparin level: 0.2-0.5 Monitor platelets by anticoagulation protocol: Yes   Plan:  Increase heparin to 1000 units/hr Check 6 hr heparin level and fibrinogen  Monitor for signs/symptoms of bleeding  Arabella Merles,  PharmD. Clinical Pharmacist 03/16/2023 3:20 AM

## 2023-03-16 NOTE — Progress Notes (Signed)
Pt. Transported to CT 1 and back to 2H19 with transport, RN and RT at bedside. VS stable throughout.

## 2023-03-16 NOTE — Op Note (Signed)
    Patient name: Sarah Wolf MRN: [id number] DOB: 1935/03/06 Sex: female  03/16/2023 Pre-operative Diagnosis: Acute on chronic limb ischemia right lower extremity Post-operative diagnosis:  Same Surgeon:  Cephus Shelling, MD Assistant: Carolynn Sayers, MD Procedure Performed: 1.  Thrombolysis catheter check right lower extremity 2.  Right SFA stenting with angioplasty x 2 (6 mm x 5 cm Viabahn and 6 mm x 40 drug-coated Eluvia) 3.  Right lower extremity arteriogram 4.  Perclose closure of left common femoral artery  Indications: 88 year old female presented with likely acute on chronic limb ischemia of the right lower extremity on Monday night.  She underwent placement of a thrombolytics catheter yesterday.  She now presents for thrombolytics catheter check of the right lower extremity after risks and benefits discussed with family.  Findings:   A V18 wire was placed down the right leg UniFuse catheter.  The common femoral and profunda are now widely patent.  The SFA is patent and there were two lesions in the SFA.  One was a focal high-grade stenosis over 90% in the proximal SFA with some associated acute thrombus that was treated with a 6 mm x 5 cm Viabahn.  Distally there was a second 50% stenosis in the SFA treated with a 6 mm x 40 mm drug-coated Eluvia.  She now had inline flow into the foot through the posterior tibial with filling of the anterior tibial although this is occluded proximally.   Procedure:  The patient was taken to room 8 from the ICU intubated.  The patient was then placed supine on the table and prepped and draped in the usual sterile fashion.  A time out was called.  A V18 wire was used to place down the UniFuse catheter in the right leg and the UniFuse catheter was removed.  We gave additional 2000 units of IV heparin.  ACT was checked to maintain goal >250 and additional heparin was given during the case.  We then got hand-injection of the right lower extremity from the  sheath in the left groin over the aortic bifurcation to include lower extremity runoff.  Ultimately she now had inline flow through the posterior tibial.  There were multiple lesions identified in the SFA as noted above.  The first SFA lesion that was high grade >90% was treated with a 6 mm x 5 cm Viabahn postdilated with a 5 mm balloon.  There was a second lesion more distal in the SFA and this was treated with a 6 mm x 40 mm drug-coated Eluvia postdilated with a 5 mm balloon for a >50% stenosis.  She now has inline flow down the posterior tibial.  Wires and catheters were removed.  A Perclose device was placed in the left groin.  Taken back to the ICU intubated.    Plan: Resume heparin in 6 hours after sheath removal.   Cephus Shelling, MD [facility] Office: [phone]

## 2023-03-16 NOTE — Progress Notes (Signed)
Patient transported from 2H19 to Cath Lab and back to 2H19 with no complications.

## 2023-03-16 NOTE — Progress Notes (Signed)
Rounding Note    Patient Name: Sarah Wolf Date of Encounter: 03/16/2023  [facility] Cardiologist: Dietrich Pates, MD   Subjective   PT intubated    Inpatient Medications    Scheduled Meds:  Chlorhexidine Gluconate Cloth  6 each Topical Daily   docusate  100 mg Per Tube BID   feeding supplement  237 mL Oral BID BM   insulin aspart  0-6 Units Subcutaneous Q4H   ketamine HCl       midazolam       pantoprazole (PROTONIX) IV  40 mg Intravenous Daily   pneumococcal 20-valent conjugate vaccine  0.5 mL Intramuscular Tomorrow-1000   polyethylene glycol  17 g Per Tube Daily   rocuronium bromide       sodium chloride flush  3 mL Intravenous Q12H   sodium chloride flush  3 mL Intravenous Q12H   succinylcholine       Continuous Infusions:  sodium chloride 50 mL/hr at 03/15/23 0828   sodium chloride     sodium chloride 10 mL/hr at 03/16/23 0600   sodium chloride     alteplase (LIMB ISCHEMIA) 10 mg in normal saline (0.02 mg/mL) infusion 1 mg/hr (03/16/23 0600)   ampicillin-sulbactam (UNASYN) IV Stopped (03/16/23 0351)   dexmedetomidine (PRECEDEX) IV infusion Stopped (03/16/23 0232)   heparin 1,000 Units/hr (03/16/23 0600)   norepinephrine (LEVOPHED) Adult infusion 2 mcg/min (03/15/23 2200)   PRN Meds: sodium chloride, acetaminophen **OR** acetaminophen, albuterol, fentaNYL (SUBLIMAZE) injection, fentaNYL (SUBLIMAZE) injection, hydrALAZINE, HYDROcodone-acetaminophen, ketamine HCl, labetalol, midazolam, midazolam, ondansetron (ZOFRAN) IV, mouth rinse, rocuronium bromide, sodium chloride flush, succinylcholine   Vital Signs    Vitals:   03/16/23 0615 03/16/23 0630 03/16/23 0645 03/16/23 0728  BP: (!) 159/92 124/74 (!) 140/105   Pulse: 99 95 91 88  Resp: 20 (!) 21 20 20   Temp:      TempSrc:      SpO2: 98% 93% 93% 94%  Weight:      Height:        Intake/Output Summary (Last 24 hours) at 03/16/2023 0731 Last data filed at 03/16/2023 0600 Gross per 24 hour  Intake  1941.4 ml  Output 300 ml  Net 1641.4 ml      03/16/2023    5:00 AM 03/15/2023    6:22 AM 03/14/2023    5:00 AM  Last 3 Weights  Weight (lbs) 109 lb 9.1 oz 126 lb 12.8 oz 131 lb 6.3 oz  Weight (kg) 49.7 kg 57.516 kg 59.6 kg      Telemetry    Atrial fibrillation 80s   . - Personally Reviewed  ECG    No new ECG tracing . - Personally Reviewed  Physical Exam   GEN: Pt intubated, sedated     Neck: No JVD. Cardiac: Irregularly irregular rhythm with normal rate. No murmurs, Respiratory: Clear anteriorly  Decreased BS R base   Ext   No lower extremity edema.    Labs    High Sensitivity Troponin:   Recent Labs  Lab 03/13/23 1030 03/13/23 1235  TROPONINIHS 55* 57*     Chemistry Recent Labs  Lab 03/13/23 1030 03/13/23 1559 03/14/23 1008 03/15/23 1027 03/15/23 2344  NA 133*  --  133* 135 136  K 3.5  --  3.9 3.2* 3.7  CL 93*  --  94* 98  --   CO2 27  --  26 28  --   GLUCOSE 140*  --  125* 102*  --   BUN 14  --  22 16  --   CREATININE 0.87  --  0.96 0.74  --   CALCIUM 9.1  --  8.8* 8.4*  --   MG  --  2.2  --   --   --   PROT 7.0  --   --  5.3*  --   ALBUMIN 3.6  --   --  2.7*  --   AST 28  --   --  27  --   ALT 19  --   --  22  --   ALKPHOS 60  --   --  44  --   BILITOT 1.3*  --   --  1.4*  --   GFRNONAA >60  --  58* >60  --   ANIONGAP 13  --  13 9  --     Lipids  Recent Labs  Lab 03/13/23 1559  CHOL 153  TRIG 40  HDL 47  LDLCALC 98  CHOLHDL 3.3    Hematology Recent Labs  Lab 03/15/23 1027 03/15/23 1932 03/15/23 2344 03/16/23 0224  WBC 10.8* 13.0*  --  9.6  RBC 3.92 3.99  --  4.15  HGB 12.1 12.7 13.3 13.2  HCT 37.7 39.5 39.0 41.5  MCV 96.2 99.0  --  100.0  MCH 30.9 31.8  --  31.8  MCHC 32.1 32.2  --  31.8  RDW 14.0 14.1  --  14.1  PLT 148* 153  --  135*   Thyroid  Recent Labs  Lab 03/13/23 1030  TSH 2.940    BNP Recent Labs  Lab 03/13/23 1030  BNP 799.9*    DDimer No results for input(s): "DDIMER" in the last 168 hours.    Radiology    CT HEAD WO CONTRAST ( )  Result Date: 03/16/2023 CLINICAL DATA:  Altered mental status EXAM: CT HEAD WITHOUT CONTRAST TECHNIQUE: Contiguous axial images were obtained from the base of the skull through the vertex without intravenous contrast. RADIATION DOSE REDUCTION: This exam was performed according to the departmental dose-optimization program which includes automated exposure control, adjustment of the mA and/or kV according to patient size and/or use of iterative reconstruction technique. COMPARISON:  None Available. FINDINGS: Brain: There is no mass, hemorrhage or extra-axial collection. The size and configuration of the ventricles and extra-axial CSF spaces are normal. There is hypoattenuation of the white matter, most commonly indicating chronic small vessel disease. Vascular: No abnormal hyperdensity of the major intracranial arteries or dural venous sinuses. No intracranial atherosclerosis. Skull: The visualized skull base, calvarium and extracranial soft tissues are normal. Sinuses/Orbits: No fluid levels or advanced mucosal thickening of the visualized paranasal sinuses. No mastoid or middle ear effusion. The orbits are normal. IMPRESSION: Chronic small vessel disease without acute intracranial abnormality. Electronically Signed   By: Deatra Robinson M.D.   On: 03/16/2023 00:53   Portable Chest x-ray  Result Date: 03/15/2023 CLINICAL DATA:  Check endotracheal tube placement EXAM: PORTABLE CHEST 1 VIEW COMPARISON:  Film from earlier in the same day. FINDINGS: Cardiac shadow is within normal limits. Aortic calcifications are noted. Endotracheal tube is noted in satisfactory position. Gastric catheter is seen in the stomach beneath an elevated left hemidiaphragm. Mild vascular congestion remains. IMPRESSION: Stable vascular congestion. Tubes and lines as described. Electronically Signed   By: Alcide Clever M.D.   On: 03/15/2023 22:37   DG CHEST PORT 1 VIEW  Result Date:  03/15/2023 CLINICAL DATA:  Respiratory distress EXAM: PORTABLE CHEST 1 VIEW COMPARISON:  03/13/2023 FINDINGS: Cardiac  shadow is enlarged. Aortic calcifications are noted. Ingested tablets are noted within the stomach. Central vascular congestion is noted without significant interstitial edema. No sizable effusion is noted. No bony abnormality is noted. IMPRESSION: Mild CHF. Electronically Signed   By: Alcide Clever M.D.   On: 03/15/2023 21:45   PERIPHERAL VASCULAR CATHETERIZATION  Result Date: 03/15/2023 Images from the original result were not included. Patient name: Noemy Pellerin MRN: [id number] DOB: [birth year] Sex: female 03/15/2023 Pre-operative Diagnosis: [place] 1 acute limb ischemia in the right leg Post-operative diagnosis:  Same Surgeon:  Victorino Sparrow, MD Procedure Performed: 1.  Ultrasound-guided micropuncture access of the left common femoral artery 2.  Aortogram 3.  Second-order cannulation, right lower extremity angiogram 4.  Third cannulation, right lower extremity angiogram 5.  Initiation of thrombolysis therapy through a 50 cm UniFuse catheter spanning from the common femoral artery through the popliteal artery. 6.  Moderate sedation time 33 minutes, contrast volume 60 mL Indications: Patient is an 88 year old female with recent diagnosis of atrial fibrillation who presented with bilateral lower extremity pain which abated with heparinization.  CTA demonstrated occluded superficial femoral artery in the right lower extremity with significant atherosclerotic disease.  ABI demonstrated no toe pressure in the right lower extremity.  Right foot pain returned.  Discussed right lower extremity angiogram with possible intervention in an effort to improve distal perfusion to alleviate rest pain. Findings: Aortogram: Bilateral renal arteries patent.  No flow-limiting stenosis in the aortoiliac segments bilaterally On the right: Widely patent common femoral artery, widely patent profunda with a branch  occluded from thrombus.  Superficial femoral artery is patent for 4 cm prior to occlusion.  There is reconstitution of the mid SFA continuing into the P1 segment of the popliteal artery.  The P2 P3 segment of the popliteal artery are occluded.  The ostia of the anterior tibial artery is occluded with reconstitution of collaterals at the mid tibia.  No outflow into the dorsalis pedis.  The peroneal artery appears to reconstitute via collaterals with no outflow, and the posterior tibial artery appears to continue to the foot with minimal outflow in the plantar arteries.  The plantar arteries appear to be trashed from embolic debris.  Procedure:  The patient was identified in the holding area and taken to room 8.  The patient was then placed supine on the table and prepped and draped in the usual sterile fashion.  A time out was called.  Ultrasound was used to evaluate the left common femoral artery.  It was patent .  A digital ultrasound image was acquired.  A micropuncture needle was used to access the right common femoral artery under ultrasound guidance.  An 018 wire was advanced without resistance and a micropuncture sheath was placed.  The 018 wire was removed and a benson wire was placed.  The micropuncture sheath was exchanged for a 5 french sheath.  An omniflush catheter was advanced over the wire to the level of L-1.  An abdominal angiogram was obtained.  Next, using the omniflush catheter and a benson wire, the aortic bifurcation was crossed and the catheter was placed into theright external iliac artery and right runoff was obtained.  See results above. 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  Being that this cardioembolic event was recent, I felt [place] would be best treated with thrombolytic therapy.  A 50 cm UniFuse catheter was brought onto the field and parked in the distal portion of the  P3 segment of the popliteal artery.  Angiography followed  from the distal aspect demonstrating single-vessel posterior tibial outflow to the level of the ankle with no flow in the foot.  I elected to proceed with initiation of thrombolysis therapy. 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  This pathology has a high rate of limb loss.  Being that this cardioembolic event was recent, and Grizel has no history of recent fall, bleed, surgery, brain malignancy, [place] would be best treated with thrombolytic therapy.  This was initiated at 1 mg/h Fara Olden, MD [facility] Office: [phone]   VAS [place] ABI WITH/WO TBI  Result Date: 03/14/2023  LOWER EXTREMITY DOPPLER STUDY Patient Name:  SIMIYA MUSIC  Date of Exam:   03/14/2023 Medical Rec #: [id number]   Accession #:    [id number] Date of Birth: 03/18/1935   Patient Gender: F Patient Age:   81 years Exam Location:  [facility] Procedure:      VAS [place] ABI WITH/WO TBI Referring Phys: Lemar Livings --------------------------------------------------------------------------------  Indications: SFA occlusion found incidentally on venous examination for right              leg pain yesterday. Other Factors: Atrial fibrillation.  Vascular Interventions: No prior interventions. Limited prior history. Provoked                         right peroneal DVT s/p surgery in 2020. Limitations: Today's exam was limited due to involuntary patient movement. Comparison Study: CTA aorta bifemoral performed yesterday for finding of SFA                   occlusion on venous ultrasound.                   "Occlusion right superficial femoral artery beginning several                   cm beyond the origin in extending into the distal thigh.                   Complete occlusion of the left popliteal artery beginning at                   the P3 segment and extending through the trifurcation." Performing Technologist: Jean Rosenthal RDMS RVT  Examination Guidelines: A  complete evaluation includes at minimum, Doppler waveform signals and systolic blood pressure reading at the level of bilateral brachial, anterior tibial, and posterior tibial arteries, when vessel segments are accessible. Bilateral testing is considered an integral part of a complete examination. Photoelectric Plethysmograph (PPG) waveforms and toe systolic pressure readings are included as required and additional duplex testing as needed. Limited examinations for reoccurring indications may be performed as noted.  ABI Findings: +---------+------------------+-----+---------+--------+ Right    Rt Pressure (mmHg)IndexWaveform Comment  +---------+------------------+-----+---------+--------+ Brachial 137                    triphasic         +---------+------------------+-----+---------+--------+ DP       0                 0.00 absent            +---------+------------------+-----+---------+--------+ Great Toe0                 0.00 Absent            +---------+------------------+-----+---------+--------+ +---------+------------------+-----+----------+-------+  Left     Lt Pressure (mmHg)IndexWaveform  Comment +---------+------------------+-----+----------+-------+ Brachial 133                    triphasic         +---------+------------------+-----+----------+-------+ PTA      77                0.56 monophasic        +---------+------------------+-----+----------+-------+ DP       69                0.50 monophasic        +---------+------------------+-----+----------+-------+ Great Toe21                0.15 Abnormal          +---------+------------------+-----+----------+-------+  Summary: Right: Resting right ankle-brachial index indicates critical limb ischemia. Left: Resting left ankle-brachial index indicates moderate left lower extremity arterial disease. The left toe-brachial index is abnormal. *See table(s) above for measurements and observations.   Electronically signed by Waverly Ferrari MD on 03/14/2023 at 3:05:09 PM.    Final    VAS [place] LOWER EXTREMITY ARTERIAL DUPLEX  Result Date: 03/14/2023 LOWER EXTREMITY ARTERIAL DUPLEX STUDY Patient Name:  CAILIE QUINTON  Date of Exam:   03/14/2023 Medical Rec #: [id number]   Accession #:    [id number] Date of Birth: 23-Jan-1935   Patient Gender: F Patient Age:   23 years Exam Location:  [facility] Procedure:      VAS [place] LOWER EXTREMITY ARTERIAL DUPLEX Referring Phys: Lemar Livings --------------------------------------------------------------------------------  Indications: SFA occlusion found incidentally on venous examination for right              leg pain yesterday. Other Factors: Atrial fibrillation.  Vascular Interventions: No prior interventions. Limited prior history. Provoked                         right peroneal DVT s/p surgery in 2020. Current ABI:            RT 0.0/0.0, LT 0.56/0.15 Limitations: Involuntary patient movement. Comparison Study: CTA aorta bifemoral performed yesterday for finding of SFA                   occlusion.                   "Occlusion right superficial femoral artery beginning several                   cm beyond the origin in extending into the distal thigh.                   Complete occlusion of the left popliteal artery beginning at                   the P3 segment and extending through the trifurcation." Performing Technologist: Jean Rosenthal RDMS, RVT  Examination Guidelines: A complete evaluation includes B-mode imaging, spectral Doppler, color Doppler, and power Doppler as needed of all accessible portions of each vessel. Bilateral testing is considered an integral part of a complete examination. Limited examinations for reoccurring indications may be performed as noted.  +-----------+--------+-----+--------+-------------------+----------------------+ RIGHT      PSV cm/sRatioStenosisWaveform           Comments                +-----------+--------+-----+--------+-------------------+----------------------+ CFA Prox   78  triphasic                                 +-----------+--------+-----+--------+-------------------+----------------------+ CFA Mid    70                   triphasic                                 +-----------+--------+-----+--------+-------------------+----------------------+ CFA Distal 29                   biphasic                                  +-----------+--------+-----+--------+-------------------+----------------------+ DFA        102                  triphasic                                 +-----------+--------+-----+--------+-------------------+----------------------+ SFA Prox   13                   monophasic         pre-occlusive          +-----------+--------+-----+--------+-------------------+----------------------+ SFA Mid                 occluded                                          +-----------+--------+-----+--------+-------------------+----------------------+ SFA Distal 24                   monophasic         reconstituted via                                                         collateral             +-----------+--------+-----+--------+-------------------+----------------------+ POP Prox                                           Unable to obtain                                                          signal- venous                                                            interference and  patient movement       +-----------+--------+-----+--------+-------------------+----------------------+ POP Mid    14                   monophasic                                +-----------+--------+-----+--------+-------------------+----------------------+ POP Distal 7                    dampened monophasic                        +-----------+--------+-----+--------+-------------------+----------------------+ ATA Prox   8                    dampened monophasic                       +-----------+--------+-----+--------+-------------------+----------------------+ ATA Mid    6                    dampened monophasic                       +-----------+--------+-----+--------+-------------------+----------------------+ ATA Distal                                         Unable to obtain                                                          signal                 +-----------+--------+-----+--------+-------------------+----------------------+ PTA Prox   17                   dampened monophasic                       +-----------+--------+-----+--------+-------------------+----------------------+ PTA Mid    6                    dampened monophasic                       +-----------+--------+-----+--------+-------------------+----------------------+ PTA Distal 4                    dampened monophasic                       +-----------+--------+-----+--------+-------------------+----------------------+ PERO Prox                                          Unable to obtain                                                          signal                 +-----------+--------+-----+--------+-------------------+----------------------+ PERO  Mid                                           Unable to obtain                                                          signal                 +-----------+--------+-----+--------+-------------------+----------------------+ PERO Distal                                        Unable to obtain                                                          signal                 +-----------+--------+-----+--------+-------------------+----------------------+ DP                                                 Unable to obtain                                                           signal                 +-----------+--------+-----+--------+-------------------+----------------------+  +-----------+--------+-----+--------+-------------------+----------------------+ LEFT       PSV cm/sRatioStenosisWaveform           Comments               +-----------+--------+-----+--------+-------------------+----------------------+ CFA Prox   104                  triphasic                                 +-----------+--------+-----+--------+-------------------+----------------------+ CFA Mid    82                   triphasic                                 +-----------+--------+-----+--------+-------------------+----------------------+ CFA Distal 76                   triphasic                                 +-----------+--------+-----+--------+-------------------+----------------------+ DFA        52  triphasic                                 +-----------+--------+-----+--------+-------------------+----------------------+ SFA Prox   60                   triphasic                                 +-----------+--------+-----+--------+-------------------+----------------------+ SFA Mid    69                   triphasic                                 +-----------+--------+-----+--------+-------------------+----------------------+ SFA Distal 102                  triphasic                                 +-----------+--------+-----+--------+-------------------+----------------------+ POP Prox   71                   triphasic                                 +-----------+--------+-----+--------+-------------------+----------------------+ POP Mid    8                    monophasic         Pre-occlusive          +-----------+--------+-----+--------+-------------------+----------------------+ POP Distal              occluded                                           +-----------+--------+-----+--------+-------------------+----------------------+ ATA Prox   15                   monophasic                                +-----------+--------+-----+--------+-------------------+----------------------+ ATA Mid    9                    monophasic                                +-----------+--------+-----+--------+-------------------+----------------------+ ATA Distal 12                   monophasic                                +-----------+--------+-----+--------+-------------------+----------------------+ PTA Prox                occluded                   Reconstituted via  collateral             +-----------+--------+-----+--------+-------------------+----------------------+ PTA Mid    21                   monophasic                                +-----------+--------+-----+--------+-------------------+----------------------+ PTA Distal 19                   monophasic                                +-----------+--------+-----+--------+-------------------+----------------------+ PERO Prox                                          Unable to obtain                                                          signal                 +-----------+--------+-----+--------+-------------------+----------------------+ PERO Mid   9                    dampened monophasic                       +-----------+--------+-----+--------+-------------------+----------------------+ PERO Distal5                    dampened monophasic                       +-----------+--------+-----+--------+-------------------+----------------------+ DP         12                   monophasic                                +-----------+--------+-----+--------+-------------------+----------------------+  Summary: Right: Total occlusion noted in the superficial femoral artery.  Reconstitution of the distal SFA via collateral. Dampened flow noted in the posterior tibial and anterior tibial ateries. Unable to detect flow in the peroneal artery. Left: Total occlusion noted in the popliteal artery. Reconstitution of the proximal PTA via collateral. Monophasic flow noted throughout the anterior tibial and peroneal arteries.  See table(s) above for measurements and observations. Electronically signed by Waverly Ferrari MD on 03/14/2023 at 3:04:15 PM.    Final    ECHOCARDIOGRAM COMPLETE  Result Date: 03/14/2023    ECHOCARDIOGRAM REPORT   Patient Name:   TORRYN HUNTING Date of Exam: 03/14/2023 Medical Rec #:  [id number]  Height:       65.0 in Accession #:    [id number] Weight:       131.4 lb Date of Birth:  1935-04-22  BSA:          1.655 m Patient Age:    88 years   BP:           153/80 mmHg Patient Gender: F          HR:  100 bpm. Exam Location:  Inpatient Procedure: 2D Echo, Cardiac Doppler and Color Doppler Indications:    Arhythmia  History:        Patient has prior history of Echocardiogram examinations, most                 recent 03/09/2019. Arrythmias:Atrial Fibrillation.  Sonographer:    Darlys Gales Referring Phys: [id number] RONDELL A SMITH IMPRESSIONS  1. Left ventricular ejection fraction, by estimation, is 40 to 45%. The left ventricle has mildly decreased function. Left ventricular endocardial border not optimally defined to evaluate regional wall motion. Left ventricular diastolic function could not be evaluated.  2. Right ventricular systolic function was not well visualized. The right ventricular size is dilated.  3. A small pericardial effusion is present. The pericardial effusion is surrounding the apex.  4. The mitral valve is grossly normal. Trivial mitral valve regurgitation. No evidence of mitral stenosis.  5. The aortic valve was not well visualized. Aortic valve regurgitation is not visualized.  6. Cannot exclude a small PFO.  7. Technically difficult study.  Comparison(s): Unable to view prior study. LVEF decreased from prior reporting. FINDINGS  Left Ventricle: Left ventricular ejection fraction, by estimation, is 40 to 45%. The left ventricle has mildly decreased function. Left ventricular endocardial border not optimally defined to evaluate regional wall motion. The left ventricular internal cavity size was normal in size. There is no left ventricular hypertrophy. Left ventricular diastolic function could not be evaluated due to atrial fibrillation. Left ventricular diastolic function could not be evaluated. Right Ventricle: The right ventricular size is dilated. No increase in right ventricular wall thickness. Right ventricular systolic function was not well visualized. Left Atrium: Left atrial size was normal in size. Right Atrium: Right atrial size was normal in size. Pericardium: A small pericardial effusion is present. The pericardial effusion is surrounding the apex. Mitral Valve: The mitral valve is grossly normal. Trivial mitral valve regurgitation. No evidence of mitral valve stenosis. Tricuspid Valve: The tricuspid valve is not well visualized. Tricuspid valve regurgitation is not demonstrated. Aortic Valve: The aortic valve was not well visualized. Aortic valve regurgitation is not visualized. Pulmonic Valve: The pulmonic valve was not well visualized. Pulmonic valve regurgitation is not visualized. No evidence of pulmonic stenosis. Aorta: The aortic root and ascending aorta are structurally normal, with no evidence of dilitation. IAS/Shunts: Cannot exclude a small PFO.  LEFT VENTRICLE PLAX 2D LVIDd:         3.70 cm   Diastology LVIDs:         2.80 cm   LV e' medial:    3.70 cm/s LV PW:         0.90 cm   LV E/e' medial:  26.1 LV IVS:        0.70 cm   LV e' lateral:   5.55 cm/s LVOT diam:     1.90 cm   LV E/e' lateral: 17.4 LV SV:         41 LV SV Index:   25 LVOT Area:     2.84 cm  LEFT ATRIUM             Index        RIGHT ATRIUM           Index LA Vol  (A2C):   44.7 ml 27.01 ml/m  RA Area:     13.40 cm LA Vol (A4C):   50.7 ml 30.64 ml/m  RA Volume:   28.80 ml  17.41  ml/m LA Biplane Vol: 49.0 ml 29.61 ml/m  AORTIC VALVE LVOT Vmax:   94.80 cm/s LVOT Vmean:  62.600 cm/s LVOT VTI:    0.145 m MITRAL VALVE               TRICUSPID VALVE MV Area (PHT): 4.60 cm    TR Peak grad:   13.7 mmHg MV Decel Time: 165 msec    TR Vmax:        185.00 cm/s MV E velocity: 96.70 cm/s                            SHUNTS                            Systemic VTI:  0.14 m                            Systemic Diam: 1.90 cm Riley Lam MD Electronically signed by Riley Lam MD Signature Date/Time: 03/14/2023/11:56:26 AM    Final     Cardiac Studies   Echocardiogram 03/14/2023: Impressions: 1. Left ventricular ejection fraction, by estimation, is 40 to 45%. The  left ventricle has mildly decreased function. Left ventricular endocardial  border not optimally defined to evaluate regional wall motion. Left  ventricular diastolic function could  not be evaluated.   2. Right ventricular systolic function was not well visualized. The right  ventricular size is dilated.   3. A small pericardial effusion is present. The pericardial effusion is  surrounding the apex.   4. The mitral valve is grossly normal. Trivial mitral valve  regurgitation. No evidence of mitral stenosis.   5. The aortic valve was not well visualized. Aortic valve regurgitation  is not visualized.   6. Cannot exclude a small PFO.   7. Technically difficult study.   Comparison(s): Unable to view prior study. LVEF decreased from prior  reporting.  _______________  Lower Extremity Arterial Ultrasound 03/14/2023: Summary:  Right: Total occlusion noted in the superficial femoral artery.  Reconstitution of the distal SFA via collateral. Dampened flow noted in  the posterior tibial and anterior tibial ateries. Unable to detect flow in  the peroneal artery.   Left: Total occlusion noted in  the popliteal artery. Reconstitution of the  proximal PTA via collateral. Monophasic flow noted throughout the anterior  tibial and peroneal arteries.   ABI Summary: Right: Resting right ankle-brachial index indicates critical limb  ischemia.   Left: Resting left ankle-brachial index indicates moderate left lower  extremity arterial disease. The left toe-brachial index is abnormal.    Patient Profile     88 y.o. female with a history of paroxysmal atrial fibrillation noted on Echo in 2020, hypertension, prior DVT in 2020 after femur fracture (completed course of anticoagulation), and multiple falls who was admitted on 03/13/2023 for critical limb ischemia after presenting with leg pain. CTA showed complete occlusion of the left popliteal artery and right SFA (possible embolic phenomenon). Also noted to be in rapid atrial fibrillation and acute CHF for which Cardiology was consulted.  Assessment & Plan    Paroxysmal Atrial Fibrillation   Paroxysmal atrial fibrillation was noted on an Echo in 2020 but she was never seen by Cardiology.  Presented at this hospitalization with Afib with RVR   Initially on IV amiodarone   Stopped due to concern for cardioversion and possible embolic event  IF dilt started    Switched to b blocker      Acute on Chronic HFrEF Echo showed LVEF of 40-45% but endocardial border was not optimally defined to evaluate for regional wall motion abnormalities.  Volume appears   \\  Pulmonary   Pt appears to have aspirated secretions last night   Developed resp distress   No intubated      Sats are good on 40% FiO2 CCM following     PAD CTA showed completed occlusion of left popliteal artery and right SFA as well as CTO of right internal iliac artery. Lower extremity arterial ultrasounds showed also showed total occlusion of left popliteal artery and right SFA. ABIs inidicative of critical limb ischemia on the right and moderate disease on the left. Pt underwent cath  with thrombolysis starting yesterday Continues on alteplase     For questions or updates, please contact [facility] Please consult [email or web address] for contact info under        Signed, Dietrich Pates, MD  03/16/2023, 7:31 AM

## 2023-03-16 NOTE — Progress Notes (Signed)
PT Cancellation Note  Patient Details Name: Tanija Kerst MRN: [id number] DOB: 1934/12/07   Cancelled Treatment:    Reason Eval/Treat Not Completed: Medical issues which prohibited therapy; Patient intubated and not following commands.  Will continue attempts.    Elray Mcgregor 03/16/2023, 9:48 AM Sheran Lawless, PT Acute Rehabilitation Services Office:[phone] 03/16/2023

## 2023-03-16 NOTE — Progress Notes (Signed)
ANTICOAGULATION CONSULT NOTE- Follow Up Pharmacy Consult for heparin Indication: atrial fibrillation   No Known Allergies  Patient Measurements: Height: 5\' 5"  (165.1 cm) Weight: 49.7 kg (109 lb 9.1 oz) IBW/kg (Calculated) : 57 Heparin Dosing Weight: TBW  Vital Signs: Temp: 98.4 F (36.9 C) (08/29 1125) Temp Source: Axillary (08/29 1125) BP: 100/62 (08/29 1215) Pulse Rate: 87 (08/29 1215)  Labs: Recent Labs    03/13/23 1439 03/13/23 2226 03/15/23 1027 03/15/23 1932 03/15/23 2344 03/16/23 0224 03/16/23 0809 03/16/23 1055  HGB  --    < > 12.1 12.7 13.3 13.2 11.3*  --   HCT  --    < > 37.7 39.5 39.0 41.5 35.2*  --   PLT  --    < > 148* 153  --  135* 118*  --   APTT 37*  --   --   --   --   --   --   --   HEPARINUNFRC  --    < > 0.13* <0.10*  --  0.20*  --  0.18*  CREATININE  --    < > 0.74  --   --   --  0.87 0.99   < > = values in this interval not displayed.    Estimated Creatinine Clearance: 32 mL/min (by C-G formula based on SCr of 0.99 mg/dL).  Assessment: 88 y.o. female with Afib w/  possible LAA thrombus and occlusion of LE artery on heparin. Pharmacy dosing heparin  S/p Aortogram w/ tPA. Heparin level is subtherapeutic at 0.18, on heparin infusion at 1000 units/hr. Hgb 11s, plt low at 118. Fibrinogen 344. No s/sx of bleeding or infusion issues.  Goal of Therapy:  Heparin level: 0.2-0.5 Monitor platelets by anticoagulation protocol: Yes   Plan:  Increase heparin infusion to 1050 units/hr Check 6 hr heparin level and fibrinogen  Monitor for signs/symptoms of bleeding  Thank you for allowing pharmacy to participate in this patient's care,  Sherron Monday, PharmD, BCCCP Clinical Pharmacist  Phone: [phone] 03/16/2023 1:36 PM  Please check AMION for all [facility] Pharmacy phone numbers After 10:00 PM, call Main Pharmacy [phone]

## 2023-03-16 NOTE — Progress Notes (Signed)
PCCM INTERVAL PROGRESS NOTE   Evaluated in ICU post operatively. Now has dopplerable PT pulses on the right. Foot is warm following stent placement. She remains on minimal vent settings with adequate O2 sats and is hemodynamically stable on one mcg NE. Family updated at bedside.    Joneen Roach, AGACNP-BC [facility]  See Amion for personal pager PCCM on call pager [phone] until 7pm. Please call Elink 7p-7a. [phone]  03/16/2023 5:12 PM

## 2023-03-16 NOTE — Progress Notes (Addendum)
[facility]  Subjective  - Intubated, moving and opening eyes   Objective (!) 140/105 91 97.8 F (36.6 C) (Axillary) 20 93%  Intake/Output Summary (Last 24 hours) at 03/16/2023 0700 Last data filed at 03/16/2023 0600 Gross per 24 hour  Intake 1941.4 ml  Output 300 ml  Net 1641.4 ml    Lysis catheter in place No doppler signals right LE lower diatal half of right leg is cool to touch without motor Left weak AT doppler signal, foot warm to touch, motor intact   Assessment/Planning: POD #1 Aortogram   Initiation of thrombolysis therapy through a 50 cm UniFuse catheter spanning from the common femoral artery through the popliteal artery.   Dr. 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  This pathology has a high rate of limb loss.  Being that this cardioembolic event was recent, and Sarah Wolf has no history of recent fall, bleed, surgery, brain malignancy, Sarah Wolf would be best treated with thrombolytic therapy.  This was initiated at 1 mg/h   Plan to return for Lysis recheck today with DR. Clark She is at high risk of right LE limb loss.  "aspiration event" with worsening hypoxia, gurgling respirations, respiratory distress.   Intubated for airway protection  NPO now  DNR  Sarah Wolf 03/16/2023 7:00 AM --  VASCULAR STAFF ADDENDUM: I have independently interviewed and examined the patient. I agree with the above.  Intubated overnight likely due to overmedication with versed.  Concern for aspiration. Off pressor.  Leg continues to be without signal.  I had a long conversation with her  niece, who is an ED nurse at [employer]. We discussed options including continued lytic therapy, lytic recheck, open surgery.  I do not think open surgery at this time is Advil best interest due to significant amount of native atherosclerotic disease.  I think she be best served with continued  thrombolysis.  Catheter is usually checked every 24 hours.  Being that she has improved, and is off postmedication, think it safe to pursue.  The goal would hopefully be to stop lysis today to work towards sitting her up and work toward extubation. Family do not think that we are would want to live with an above-knee amputation.  My plan is to perform the procedure today with possible continued lysis for another 24 hours.  At that point, lysis would be terminated.  Appreciate continued antibiotics for possible aspiration event. Appears fluid overloaded.  DNR.   Victorino Sparrow MD [facility] Phone Number: [phone] 03/16/2023 12:24 PM     Laboratory Lab Results: Recent Labs    03/15/23 1932 03/15/23 2344 03/16/23 0224  WBC 13.0*  --  9.6  HGB 12.7 13.3 13.2  HCT 39.5 39.0 41.5  PLT 153  --  135*   BMET Recent Labs    03/14/23 1008 03/15/23 1027 03/15/23 2344  NA 133* 135 136  K 3.9 3.2* 3.7  CL 94* 98  --   CO2 26 28  --   GLUCOSE 125* 102*  --   BUN 22 16  --   CREATININE 0.96 0.74  --   CALCIUM 8.8* 8.4*  --     COAG Lab Results  Component Value Date   INR 1.2 03/13/2023   INR 1.0 03/08/2019   No results found for: "PTT"

## 2023-03-17 ENCOUNTER — Inpatient Hospital Stay (HOSPITAL_COMMUNITY): Payer: Medicare Other

## 2023-03-17 ENCOUNTER — Encounter (HOSPITAL_COMMUNITY): Payer: Self-pay | Admitting: Vascular Surgery

## 2023-03-17 DIAGNOSIS — R569 Unspecified convulsions: Secondary | ICD-10-CM

## 2023-03-17 DIAGNOSIS — R4182 Altered mental status, unspecified: Secondary | ICD-10-CM

## 2023-03-17 DIAGNOSIS — T17908A Unspecified foreign body in respiratory tract, part unspecified causing other injury, initial encounter: Secondary | ICD-10-CM

## 2023-03-17 DIAGNOSIS — I70221 Atherosclerosis of native arteries of extremities with rest pain, right leg: Secondary | ICD-10-CM | POA: Diagnosis not present

## 2023-03-17 LAB — BASIC METABOLIC PANEL
Anion gap: 11 (ref 5–15)
BUN: 24 mg/dL — ABNORMAL HIGH (ref 8–23)
CO2: 22 mmol/L (ref 22–32)
Calcium: 8.5 mg/dL — ABNORMAL LOW (ref 8.9–10.3)
Chloride: 106 mmol/L (ref 98–111)
Creatinine, Ser: 0.98 mg/dL (ref 0.44–1.00)
GFR, Estimated: 56 mL/min — ABNORMAL LOW (ref >60)
Glucose, Bld: 143 mg/dL — ABNORMAL HIGH (ref 70–99)
Potassium: 3.6 mmol/L (ref 3.5–5.1)
Sodium: 139 mmol/L (ref 135–145)

## 2023-03-17 LAB — CBC
HCT: 35.4 % — ABNORMAL LOW (ref 36.0–46.0)
Hemoglobin: 11.3 g/dL — ABNORMAL LOW (ref 12.0–15.0)
MCH: 31.3 pg (ref 26.0–34.0)
MCHC: 31.9 g/dL (ref 30.0–36.0)
MCV: 98.1 fL (ref 80.0–100.0)
Platelets: 134 10*3/uL — ABNORMAL LOW (ref 150–400)
RBC: 3.61 MIL/uL — ABNORMAL LOW (ref 3.87–5.11)
RDW: 14.6 % (ref 11.5–15.5)
WBC: 9.4 10*3/uL (ref 4.0–10.5)
nRBC: 0 % (ref 0.0–0.2)

## 2023-03-17 LAB — GLUCOSE, CAPILLARY
Glucose-Capillary: 142 mg/dL — ABNORMAL HIGH (ref 70–99)
Glucose-Capillary: 135 mg/dL — ABNORMAL HIGH (ref 70–99)
Glucose-Capillary: 92 mg/dL (ref 70–99)
Glucose-Capillary: 91 mg/dL (ref 70–99)
Glucose-Capillary: 109 mg/dL — ABNORMAL HIGH (ref 70–99)

## 2023-03-17 LAB — HEPARIN LEVEL (UNFRACTIONATED)
Heparin Unfractionated: <0.1 [IU]/mL — ABNORMAL LOW (ref 0.30–0.70)
Heparin Unfractionated: 0.55 [IU]/mL (ref 0.30–0.70)
Heparin Unfractionated: 0.36 [IU]/mL (ref 0.30–0.70)

## 2023-03-17 LAB — PHOSPHORUS: Phosphorus: 2.4 mg/dL — ABNORMAL LOW (ref 2.5–4.6)

## 2023-03-17 LAB — MAGNESIUM: Magnesium: 2.4 mg/dL (ref 1.7–2.4)

## 2023-03-17 LAB — AMMONIA: Ammonia: 25 umol/L (ref 9–35)

## 2023-03-17 MED ORDER — K PHOS MONO-SOD PHOS DI & MONO 155-852-130 MG PO TABS
500.0000 mg | ORAL_TABLET | Freq: Once | ORAL | Status: DC
  Filled 2023-03-17: qty 2

## 2023-03-17 MED ORDER — METOPROLOL TARTRATE 12.5 MG HALF TABLET
12.5000 mg | ORAL_TABLET | Freq: Two times a day (BID) | ORAL | Status: DC
  Administered 2023-03-17 (×2): 12.5 mg via ORAL
  Filled 2023-03-17 (×2): qty 1

## 2023-03-17 MED ORDER — PHENOL 1.4 % MT LIQD
1.0000 | OROMUCOSAL | Status: DC | PRN
  Administered 2023-03-17 – 2023-03-18 (×2): 1 via OROMUCOSAL
  Filled 2023-03-17: qty 177

## 2023-03-17 MED ORDER — DIGOXIN 0.25 MG/ML IJ SOLN
0.1250 mg | Freq: Once | INTRAMUSCULAR | Status: AC
  Administered 2023-03-17: 0.125 mg via INTRAVENOUS
  Filled 2023-03-17: qty 2

## 2023-03-17 MED ORDER — POTASSIUM CHLORIDE 20 MEQ PO PACK: 40.0000 meq | PACK | Freq: Once | ORAL | Status: DC

## 2023-03-17 MED ORDER — POTASSIUM CHLORIDE 20 MEQ PO PACK: 40.0000 meq | PACK | Freq: Once | ORAL | Status: AC

## 2023-03-17 MED ORDER — GUAIFENESIN-DM 100-10 MG/5ML PO SYRP
5.0000 mL | ORAL_SOLUTION | ORAL | Status: DC | PRN
  Administered 2023-03-18 – 2023-03-28 (×5): 5 mL via ORAL
  Filled 2023-03-17 (×5): qty 5

## 2023-03-17 MED ORDER — ORAL CARE MOUTH RINSE: 15.0000 mL | OROMUCOSAL | Status: DC | PRN

## 2023-03-17 MED ORDER — POTASSIUM CHLORIDE 20 MEQ PO PACK
40.0000 meq | PACK | Freq: Once | ORAL | Status: AC
  Administered 2023-03-17: 40 meq
  Filled 2023-03-17: qty 2

## 2023-03-17 MED FILL — Lidocaine HCl Local Preservative Free (PF) Inj 1%: INTRAMUSCULAR | Qty: 30 | Status: AC

## 2023-03-17 NOTE — Progress Notes (Signed)
ANTICOAGULATION CONSULT NOTE- Follow Up Pharmacy Consult for heparin Indication: atrial fibrillation  No Known Allergies  Patient Measurements: Height: 5\' 2"  (157.5 cm) Weight: 58.3 kg (128 lb 8.5 oz) IBW/kg (Calculated) : 50.1 Heparin Dosing Weight: TBW  Vital Signs: Temp: 97.7 F (36.5 C) (08/30 1630) Temp Source: Oral (08/30 1630) BP: 121/84 (08/30 2119) Pulse Rate: 112 (08/30 2119)  Labs: Recent Labs    03/16/23 0224 03/16/23 0809 03/16/23 1055 03/17/23 0244 03/17/23 1236 03/17/23 1955  HGB 13.2 11.3*  --  11.3*  --   --   HCT 41.5 35.2*  --  35.4*  --   --   PLT 135* 118*  --  134*  --   --   HEPARINUNFRC 0.20*  --  0.18* <0.10* 0.55 0.36  CREATININE  --  0.87 0.99 0.98  --   --     Estimated Creatinine Clearance: 32.6 mL/min (by C-G formula based on SCr of 0.98 mg/dL).  Assessment: 88 y.o. female with Afib w/ possible LAA thrombus and occlusion of LE artery s/p Catheter-directed thrombolysis 8/29. Patient is now off alteplase. No anticoagulation prior to admission. Pharmacy consulted for heparin.    Heparin level 0.36 is therapeutic on 1200 units/hr but unexpectedly down-trended.  No issues with infusion or bleeding per RN.   Goal of Therapy:  Heparin level: 0.3 -0.7 units/ml Monitor platelets by anticoagulation protocol: Yes   Plan:  Increase heparin infusion slightly to 1250 units/hr   Monitor daily heparin level, CBC, signs/symptoms of bleeding  F/u long term anticoagulation plan   Thank you for allowing pharmacy to participate in this patient's care,  Alphia Moh, PharmD, BCPS, BCCP Clinical Pharmacist  Please check AMION for all [facility] Pharmacy phone numbers After 10:00 PM, call Main Pharmacy [phone]

## 2023-03-17 NOTE — Progress Notes (Addendum)
[facility] [place]  Subjective  - intubated with plans to extubate possibly today   Objective 113/84 90 97.6 F (36.4 C) (Axillary) 20 96%  Intake/Output Summary (Last 24 hours) at 03/17/2023 0711 Last data filed at 03/17/2023 0630 Gross per 24 hour  Intake 2240.26 ml  Output 1465 ml  Net 775.26 ml    Significant improved DP/PT right LE doppler signals with warm to touch skin, maintained left AT doppler signal Left groin soft Heart RRR Lungs intubated supported  Pending chest x ray  Assessment/Planning: POD # 1  Procedure Performed: 1.  Thrombolysis catheter check right lower extremity 2.  Right SFA stenting with angioplasty x 2 (6 mm x 5 cm Viabahn and 6 mm x 40 drug-coated Eluvia) 3.  Right lower extremity arteriogram 4.  Perclose closure of left common femoral artery  She now had inline flow into the foot through the posterior tibial with filling of the anterior tibial although this is occluded proximally.   Right PT/DP, left AT no ischemic skin changes B LE Pending chest x ray and extubation Pending mobility  Mosetta Pigeon 03/17/2023 7:11 AM --  VASCULAR STAFF ADDENDUM: I have independently interviewed and examined the patient. I agree with the above.  Signals bilaterally.  Can sit up and work toward extubation. Continue heparin and transition to DOAC.  Victorino Sparrow MD [facility] Phone Number: [phone] 03/17/2023 8:25 AM    Laboratory Lab Results: Recent Labs    03/16/23 0809 03/17/23 0244  WBC 9.3 9.4  HGB 11.3* 11.3*  HCT 35.2* 35.4*  PLT 118* 134*   BMET Recent Labs    03/16/23 1055 03/17/23 0244  NA 136 139  K 3.5 3.6  CL 101 106  CO2 22 22  GLUCOSE 118* 143*  BUN 21 24*  CREATININE 0.99 0.98  CALCIUM 8.1* 8.5*    COAG Lab Results  Component Value Date   INR 1.2 03/13/2023   INR 1.0 03/08/2019   No results found for: "PTT"

## 2023-03-17 NOTE — Progress Notes (Signed)
ANTICOAGULATION CONSULT NOTE- Follow Up Pharmacy Consult for heparin Indication: atrial fibrillation  No Known Allergies  Patient Measurements: Height: 5\' 2"  (157.5 cm) Weight: 58.3 kg (128 lb 8.5 oz) IBW/kg (Calculated) : 50.1 Heparin Dosing Weight: TBW  Vital Signs: Temp: 97.6 F (36.4 C) (08/30 0400) Temp Source: Axillary (08/30 0400) BP: 111/74 (08/30 1400) Pulse Rate: 132 (08/30 1400)  Labs: Recent Labs    03/16/23 0224 03/16/23 0809 03/16/23 1055 03/17/23 0244 03/17/23 1236  HGB 13.2 11.3*  --  11.3*  --   HCT 41.5 35.2*  --  35.4*  --   PLT 135* 118*  --  134*  --   HEPARINUNFRC 0.20*  --  0.18* <0.10* 0.55  CREATININE  --  0.87 0.99 0.98  --     Estimated Creatinine Clearance: 32.6 mL/min (by C-G formula based on SCr of 0.98 mg/dL).  Assessment: 88 y.o. female with Afib w/ possible LAA thrombus and occlusion of LE artery on heparin. Catheter-directed thrombolysis now off alteplase, return to normal heparin goal.   Heparin level is therapeutic at 0.55 on UFH infusion at 1200 units/hour. Patient has no s/sx of bleeding.  Goal of Therapy:  Heparin level: 0.3 -0.7 units/ml Monitor platelets by anticoagulation protocol: Yes   Plan:  Continue heparin infusion at 1200 units/hr  Follow up level in 8 hours Monitor daily heparin level, CBC, signs/symptoms of bleeding  F/u long term anticoagulation plan   Thank you for allowing pharmacy to participate in this patient's care,  Wilmer Floor, PharmD PGY2 Cardiology Pharmacy Resident  03/17/2023 3:09 PM

## 2023-03-17 NOTE — Progress Notes (Addendum)
Rounding Note    Patient Name: Sarah Wolf Date of Encounter: 03/17/2023  [facility] Cardiologist: Dietrich Pates, MD   Subjective   Pt just extubated  Tired     Inpatient Medications    Scheduled Meds:  aspirin  81 mg Per Tube Daily   atorvastatin  40 mg Per Tube Daily   Chlorhexidine Gluconate Cloth  6 each Topical Daily   docusate  100 mg Per Tube BID   feeding supplement  237 mL Oral BID BM   insulin aspart  0-6 Units Subcutaneous Q4H   mouth rinse  15 mL Mouth Rinse Q2H   pantoprazole (PROTONIX) IV  40 mg Intravenous Daily   pneumococcal 20-valent conjugate vaccine  0.5 mL Intramuscular Tomorrow-1000   polyethylene glycol  17 g Per Tube Daily   potassium chloride  40 mEq Per Tube Once   sodium chloride flush  3 mL Intravenous Q12H   sodium chloride flush  3 mL Intravenous Q12H   sodium chloride flush  3 mL Intravenous Q12H   Continuous Infusions:  sodium chloride 50 mL/hr at 03/17/23 0600   sodium chloride     sodium chloride 10 mL/hr at 03/17/23 0600   sodium chloride 10 mL/hr at 03/16/23 1400   sodium chloride     ampicillin-sulbactam (UNASYN) IV Stopped (03/17/23 0439)   dexmedetomidine (PRECEDEX) IV infusion 0.4 mcg/kg/hr (03/17/23 0600)   heparin 1,200 Units/hr (03/17/23 0600)   norepinephrine (LEVOPHED) Adult infusion Stopped (03/17/23 0445)   PRN Meds: sodium chloride, sodium chloride, acetaminophen **OR** acetaminophen, acetaminophen, albuterol, fentaNYL (SUBLIMAZE) injection, fentaNYL (SUBLIMAZE) injection, hydrALAZINE, hydrALAZINE, HYDROcodone-acetaminophen, labetalol, labetalol, midazolam, ondansetron (ZOFRAN) IV, ondansetron (ZOFRAN) IV, mouth rinse, mouth rinse, sodium chloride flush, sodium chloride flush   Vital Signs    Vitals:   03/17/23 0615 03/17/23 0630 03/17/23 0645 03/17/23 0700  BP: (!) 117/104 117/80 119/78 113/84  Pulse: 90 75 88 90  Resp: 20 20 20 20   Temp:      TempSrc:      SpO2: 96% 95% 96% 96%  Weight:      Height:         Intake/Output Summary (Last 24 hours) at 03/17/2023 0735 Last data filed at 03/17/2023 0630 Gross per 24 hour  Intake 2240.26 ml  Output 1465 ml  Net 775.26 ml      03/17/2023    4:14 AM 03/16/2023    5:00 AM 03/15/2023    6:22 AM  Last 3 Weights  Weight (lbs) 98 lb 15.8 oz 109 lb 9.1 oz 126 lb 12.8 oz  Weight (kg) 44.9 kg 49.7 kg 57.516 kg      Telemetry    Atrial fibrillation 80s to 90s   . - Personally Reviewed  ECG    No new ECG tracing . - Personally Reviewed  Physical Exam   GEN: pt extubated   Neck: No JVD. Cardiac: Irregularly irregular rhythm  No murmrus  Respiratory:  mild rhonchi   Ext   No LE edema     Labs    High Sensitivity Troponin:   Recent Labs  Lab 03/13/23 1030 03/13/23 1235  TROPONINIHS 55* 57*     Chemistry Recent Labs  Lab 03/13/23 1030 03/13/23 1559 03/15/23 1027 03/15/23 2344 03/16/23 0809 03/16/23 1055 03/17/23 0244  NA 133*   < > 135   < > 138 136 139  K 3.5   < > 3.2*   < > 3.8 3.5 3.6  CL 93*   < > 98  --  100 101 106  CO2 27   < > 28  --  21* 22 22  GLUCOSE 140*   < > 102*  --  130* 118* 143*  BUN 14   < > 16  --  20 21 24*  CREATININE 0.87   < > 0.74  --  0.87 0.99 0.98  CALCIUM 9.1   < > 8.4*  --  8.4* 8.1* 8.5*  MG  --    < >  --   --  1.7 1.7 2.4  PROT 7.0  --  5.3*  --   --  4.9*  --   ALBUMIN 3.6  --  2.7*  --   --  2.3*  --   AST 28  --  27  --   --  24  --   ALT 19  --  22  --   --  19  --   ALKPHOS 60  --  44  --   --  37*  --   BILITOT 1.3*  --  1.4*  --   --  2.4*  --   GFRNONAA >60   < > >60  --  >60 56* 56*  ANIONGAP 13   < > 9  --  17* 13 11   < > = values in this interval not displayed.    Lipids  Recent Labs  Lab 03/13/23 1559  CHOL 153  TRIG 40  HDL 47  LDLCALC 98  CHOLHDL 3.3    Hematology Recent Labs  Lab 03/16/23 0224 03/16/23 0809 03/17/23 0244  WBC 9.6 9.3 9.4  RBC 4.15 3.52* 3.61*  HGB 13.2 11.3* 11.3*  HCT 41.5 35.2* 35.4*  MCV 100.0 100.0 98.1  MCH 31.8 32.1 31.3   MCHC 31.8 32.1 31.9  RDW 14.1 14.2 14.6  PLT 135* 118* 134*   Thyroid  Recent Labs  Lab 03/13/23 1030  TSH 2.940    BNP Recent Labs  Lab 03/13/23 1030  BNP 799.9*    DDimer No results for input(s): "DDIMER" in the last 168 hours.   Radiology    PERIPHERAL VASCULAR CATHETERIZATION  Result Date: 03/16/2023 Images from the original result were not included.   Patient name: Sarah Wolf         MRN: [id number]        DOB: Nov 22, 1934          Sex: female  03/16/2023 Pre-operative Diagnosis: Acute on chronic limb ischemia right lower extremity Post-operative diagnosis:  Same Surgeon:  Cephus Shelling, MD Assistant: Carolynn Sayers, MD Procedure Performed: 1.  Thrombolysis catheter check right lower extremity 2.  Right SFA stenting with angioplasty x 2 (6 mm x 5 cm Viabahn and 6 mm x 40 drug-coated Eluvia) 3.  Right lower extremity arteriogram 4.  Perclose closure of left common femoral artery  Indications: 88 year old female presented with likely acute on chronic limb ischemia of the right lower extremity on Monday night.  She underwent placement of a thrombolytics catheter yesterday.  She now presents for thrombolytics catheter check of the right lower extremity after risks and benefits discussed with family.  Findings:  A V18 wire was placed down the right leg UniFuse catheter.  The common femoral and profunda are now widely patent.  The SFA is patent and there were two lesions in the SFA.  One was a focal high-grade stenosis over 90% in the proximal SFA with some associated acute thrombus that was treated with a 6 mm x  5 cm Viabahn.  Distally there was a second 50% stenosis in the SFA treated with a 6 mm x 40 mm drug-coated Eluvia.  She now had inline flow into the foot through the posterior tibial with filling of the anterior tibial although this is occluded proximally.             Procedure:  The patient was taken to room 8 from the ICU intubated.  The patient was then placed supine on the table  and prepped and draped in the usual sterile fashion.  A time out was called.  A V18 wire was used to place down the UniFuse catheter in the right leg and the UniFuse catheter was removed.  We gave additional 2000 units of IV heparin.  ACT was checked to maintain goal >250 and additional heparin was given during the case.  We then got hand-injection of the right lower extremity from the sheath in the left groin over the aortic bifurcation to include lower extremity runoff.  Ultimately she now had inline flow through the posterior tibial.  There were multiple lesions identified in the SFA as noted above.  The first SFA lesion that was high grade >90% was treated with a 6 mm x 5 cm Viabahn postdilated with a 5 mm balloon.  There was a second lesion more distal in the SFA and this was treated with a 6 mm x 40 mm drug-coated Eluvia postdilated with a 5 mm balloon for a >50% stenosis.  She now has inline flow down the posterior tibial.  Wires and catheters were removed.  A Perclose device was placed in the left groin.  Taken back to the ICU intubated.   Plan: Resume heparin in 6 hours after sheath removal.   Cephus Shelling, MD [facility] Office: [phone]   DG CHEST PORT 1 VIEW  Result Date: 03/16/2023 CLINICAL DATA:  Acute congestive heart failure. EXAM: PORTABLE CHEST 1 VIEW COMPARISON:  March 15, 2023. FINDINGS: Mild cardiomegaly is noted. Endotracheal and nasogastric tubes are unchanged in position. Increased bilateral perihilar and basilar opacities are noted concerning for worsening edema or atelectasis with increased associated pleural effusions. Bony thorax is unremarkable. IMPRESSION: Stable support apparatus. Increased bilateral edema or atelectasis with associated pleural effusions. Electronically Signed   By: Lupita Raider M.D.   On: 03/16/2023 10:59   CT HEAD WO CONTRAST ( )  Result Date: 03/16/2023 CLINICAL DATA:  Altered mental status EXAM: CT HEAD  WITHOUT CONTRAST TECHNIQUE: Contiguous axial images were obtained from the base of the skull through the vertex without intravenous contrast. RADIATION DOSE REDUCTION: This exam was performed according to the departmental dose-optimization program which includes automated exposure control, adjustment of the mA and/or kV according to patient size and/or use of iterative reconstruction technique. COMPARISON:  None Available. FINDINGS: Brain: There is no mass, hemorrhage or extra-axial collection. The size and configuration of the ventricles and extra-axial CSF spaces are normal. There is hypoattenuation of the white matter, most commonly indicating chronic small vessel disease. Vascular: No abnormal hyperdensity of the major intracranial arteries or dural venous sinuses. No intracranial atherosclerosis. Skull: The visualized skull base, calvarium and extracranial soft tissues are normal. Sinuses/Orbits: No fluid levels or advanced mucosal thickening of the visualized paranasal sinuses. No mastoid or middle ear effusion. The orbits are normal. IMPRESSION: Chronic small vessel disease without acute intracranial abnormality. Electronically Signed   By: Deatra Robinson M.D.   On: 03/16/2023 00:53   Portable Chest x-ray  Result Date: 03/15/2023 CLINICAL DATA:  Check endotracheal tube placement EXAM: PORTABLE CHEST 1 VIEW COMPARISON:  Film from earlier in the same day. FINDINGS: Cardiac shadow is within normal limits. Aortic calcifications are noted. Endotracheal tube is noted in satisfactory position. Gastric catheter is seen in the stomach beneath an elevated left hemidiaphragm. Mild vascular congestion remains. IMPRESSION: Stable vascular congestion. Tubes and lines as described. Electronically Signed   By: Alcide Clever M.D.   On: 03/15/2023 22:37   DG CHEST PORT 1 VIEW  Result Date: 03/15/2023 CLINICAL DATA:  Respiratory distress EXAM: PORTABLE CHEST 1 VIEW COMPARISON:  03/13/2023 FINDINGS: Cardiac shadow is  enlarged. Aortic calcifications are noted. Ingested tablets are noted within the stomach. Central vascular congestion is noted without significant interstitial edema. No sizable effusion is noted. No bony abnormality is noted. IMPRESSION: Mild CHF. Electronically Signed   By: Alcide Clever M.D.   On: 03/15/2023 21:45   PERIPHERAL VASCULAR CATHETERIZATION  Result Date: 03/15/2023 Images from the original result were not included. Patient name: Sarah Wolf MRN: [id number] DOB: Dec 16, 1934 Sex: female 03/15/2023 Pre-operative Diagnosis: [place] 1 acute limb ischemia in the right leg Post-operative diagnosis:  Same Surgeon:  Victorino Sparrow, MD Procedure Performed: 1.  Ultrasound-guided micropuncture access of the left common femoral artery 2.  Aortogram 3.  Second-order cannulation, right lower extremity angiogram 4.  Third cannulation, right lower extremity angiogram 5.  Initiation of thrombolysis therapy through a 50 cm UniFuse catheter spanning from the common femoral artery through the popliteal artery. 6.  Moderate sedation time 33 minutes, contrast volume 60 mL Indications: Patient is an 88 year old female with recent diagnosis of atrial fibrillation who presented with bilateral lower extremity pain which abated with heparinization.  CTA demonstrated occluded superficial femoral artery in the right lower extremity with significant atherosclerotic disease.  ABI demonstrated no toe pressure in the right lower extremity.  Right foot pain returned.  Discussed right lower extremity angiogram with possible intervention in an effort to improve distal perfusion to alleviate rest pain. Findings: Aortogram: Bilateral renal arteries patent.  No flow-limiting stenosis in the aortoiliac segments bilaterally On the right: Widely patent common femoral artery, widely patent profunda with a branch occluded from thrombus.  Superficial femoral artery is patent for 4 cm prior to occlusion.  There is reconstitution of the mid SFA  continuing into the P1 segment of the popliteal artery.  The P2 P3 segment of the popliteal artery are occluded.  The ostia of the anterior tibial artery is occluded with reconstitution of collaterals at the mid tibia.  No outflow into the dorsalis pedis.  The peroneal artery appears to reconstitute via collaterals with no outflow, and the posterior tibial artery appears to continue to the foot with minimal outflow in the plantar arteries.  The plantar arteries appear to be trashed from embolic debris.  Procedure:  The patient was identified in the holding area and taken to room 8.  The patient was then placed supine on the table and prepped and draped in the usual sterile fashion.  A time out was called.  Ultrasound was used to evaluate the left common femoral artery.  It was patent .  A digital ultrasound image was acquired.  A micropuncture needle was used to access the right common femoral artery under ultrasound guidance.  An 018 wire was advanced without resistance and a micropuncture sheath was placed.  The 018 wire was removed and a benson wire was placed.  The micropuncture sheath was exchanged for a  5 french sheath.  An omniflush catheter was advanced over the wire to the level of L-1.  An abdominal angiogram was obtained.  Next, using the omniflush catheter and a benson wire, the aortic bifurcation was crossed and the catheter was placed into theright external iliac artery and right runoff was obtained.  See results above. 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  Being that this cardioembolic event was recent, I felt Monik would be best treated with thrombolytic therapy.  A 50 cm UniFuse catheter was brought onto the field and parked in the distal portion of the P3 segment of the popliteal artery.  Angiography followed from the distal aspect demonstrating single-vessel posterior tibial outflow to the level of the ankle with no flow in the foot.  I  elected to proceed with initiation of thrombolysis therapy. 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  This pathology has a high rate of limb loss.  Being that this cardioembolic event was recent, and Lilymae has no history of recent fall, bleed, surgery, brain malignancy, Caleb would be best treated with thrombolytic therapy.  This was initiated at 1 mg/h Fara Olden, MD [facility] Office: [phone]    Cardiac Studies   Echocardiogram 03/14/2023: Impressions: 1. Left ventricular ejection fraction, by estimation, is 40 to 45%. The  left ventricle has mildly decreased function. Left ventricular endocardial  border not optimally defined to evaluate regional wall motion. Left  ventricular diastolic function could  not be evaluated.   2. Right ventricular systolic function was not well visualized. The right  ventricular size is dilated.   3. A small pericardial effusion is present. The pericardial effusion is  surrounding the apex.   4. The mitral valve is grossly normal. Trivial mitral valve  regurgitation. No evidence of mitral stenosis.   5. The aortic valve was not well visualized. Aortic valve regurgitation  is not visualized.   6. Cannot exclude a small PFO.   7. Technically difficult study.   Comparison(s): Unable to view prior study. LVEF decreased from prior  reporting.  _______________  Lower Extremity Arterial Ultrasound 03/14/2023: Summary:  Right: Total occlusion noted in the superficial femoral artery.  Reconstitution of the distal SFA via collateral. Dampened flow noted in  the posterior tibial and anterior tibial ateries. Unable to detect flow in  the peroneal artery.   Left: Total occlusion noted in the popliteal artery. Reconstitution of the  proximal PTA via collateral. Monophasic flow noted throughout the anterior  tibial and peroneal arteries.   ABI Summary: Right: Resting  right ankle-brachial index indicates critical limb  ischemia.   Left: Resting left ankle-brachial index indicates moderate left lower  extremity arterial disease. The left toe-brachial index is abnormal.    Patient Profile     88 y.o. female with a history of paroxysmal atrial fibrillation noted on Echo in 2020, hypertension, prior DVT in 2020 after femur fracture (completed course of anticoagulation), and multiple falls who was admitted on 03/13/2023 for critical limb ischemia after presenting with leg pain. CTA showed complete occlusion of the left popliteal artery and right SFA (possible embolic phenomenon). Also noted to be in rapid atrial fibrillation and acute CHF for which Cardiology was consulted.  Assessment & Plan    Atrial Fibrillation   Paroxysmal atrial fibrillation was noted on an Echo in 2020 but she was never seen by Cardiology.  Presented at this hospitalization  with Afib with RVR   Initially on IV amiodarone   Stopped due to concern for possible cardioversion on amio (CTA of chest-- LA appendage did not fill therefore  could not rule out thrombus)  Pt was switched to IV diltiazem.Now off of this      Rates this am off of meds were good   They have since crept up to 100s / 110s    SBP 100s/  Will try digoxin for now    As bp allows could add b blocker    FOllow  Continue heparin.   Acute on Chronic HFrEF Echo showed LVEF of 40-45% but endocardial border was not optimally defined to evaluate for regional wall motion abnormalities.  I have reviewed  LVEF 45%  Basal inferior wall appears hypokinetic RV function  normal to mildly decreased    Volume status overall not bad    Pulmonary   Pt appears to have aspirated secretions after vascular procedure     Developed resp distress   Intubated    She is now  extubated  Appears comfortable  CCM following  Needs swallow evaluation.   PAD CTA showed completed occlusion of left popliteal artery and right SFA as well as CTO of  right internal iliac artery. Lower extremity arterial ultrasounds showed also showed total occlusion of left popliteal artery and right SFA. ABIs inidicative of critical limb ischemia on the right and moderate disease on the left. Pt underwent cath with thrombolysis Now on heparin   Plan to transition to NOAC    HL   Pt is on atorvastatin 40 mg     For questions or updates, please contact [facility] Please consult [email or web address] for contact info under        Signed, Dietrich Pates, MD  03/17/2023, 7:35 AM

## 2023-03-17 NOTE — Progress Notes (Signed)
EEG complete - results pending 

## 2023-03-17 NOTE — Progress Notes (Signed)
ANTICOAGULATION CONSULT NOTE- Follow Up Pharmacy Consult for heparin Indication: atrial fibrillation   No Known Allergies  Patient Measurements: Height: 5\' 5"  (165.1 cm) Weight: 49.7 kg (109 lb 9.1 oz) IBW/kg (Calculated) : 57 Heparin Dosing Weight: TBW  Vital Signs: Temp: 97.4 F (36.3 C) (08/30 0000) Temp Source: Axillary (08/30 0000) BP: 135/98 (08/30 0000) Pulse Rate: 97 (08/30 0000)  Labs: Recent Labs    03/16/23 0224 03/16/23 0809 03/16/23 1055 03/17/23 0244  HGB 13.2 11.3*  --  11.3*  HCT 41.5 35.2*  --  35.4*  PLT 135* 118*  --  134*  HEPARINUNFRC 0.20*  --  0.18* <0.10*  CREATININE  --  0.87 0.99 0.98    Estimated Creatinine Clearance: 32.3 mL/min (by C-G formula based on SCr of 0.98 mg/dL).  Assessment: 88 y.o. female with Afib w/ possible LAA thrombus and occlusion of LE artery on heparin. Now s/p catheter thrombolysis, R SFA stent x2, RLE arteriogram 8/29. Pharmacy to restart heparin 6hr after sheath pull.   Sheath pulled at 16:47. Patient received 5000 units of heparin in procedure and is only 50 kg.  Now off alteplase, return to normal heparin goal.   8/30 AM: heparin level returned undetectable (<0.1) on 1050 units/hr (subtherapeutic). Heparin restarted at 2300 and lab drawn 0244 (~4h). Per RN, heparin was drawn from same line, but was paused for about 5 minutes and flushed and no issues with it running continuously or pauses. No signs symptoms of bleeding and Hgb low, stable at 11.3 and plts 118 > 134.   Level was not drawn at steady state, but will increase slightly since still undetectable.  Goal of Therapy:  Heparin level: 0.3 -0.7 units/ml Monitor platelets by anticoagulation protocol: Yes   Plan:  Increase heparin infusion to 1200 units/hr  Follow up level in 8h Monitor daily heparin level, CBC, signs/symptoms of bleeding  F/u long term anticoagulation plan   Thank you for allowing pharmacy to participate in this patient's care,  Arabella Merles, PharmD. Clinical Pharmacist 03/17/2023 3:55 AM

## 2023-03-17 NOTE — Progress Notes (Signed)
NAMELailey Zurlo, MRN:  [id number], DOB:  March 03, 1935, LOS: 4 ADMISSION DATE:  03/13/2023, CONSULTATION DATE:  8/28 REFERRING MD:  hospitalist, CHIEF COMPLAINT:  Acute respiratory failure w/ hypoxia   History of Present Illness:  Patient is a 88 yo F w/ pertinent PMH PAF, HTN, DVT 2020 no longer on DOAC presents to [facility] on 8/26 w/ critical limb ischemia RLE.  Patient complaining of worsening BLE pain worse on right. Patient noted to be in afib RVR rates 140s. SBP 160s. BNP 799. DVT US no signs of dvt but occlusion of proximal mid SFA. 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 Vasc surgery and cards consulted. Started on heparin drip and given metoprolol. Started on diltiazem drip. TRH admitted to floor.   On 8/28 patient underwent thrombolysis of RLE. That evening patient with worsening encephalopathy. Had aspiration event and became more hypoxic and worsening AMS. PCCM consulted.  Pertinent  Medical History  PAF DVT 2020 no longer on [facility] HTN  Significant Hospital Events: Including procedures, antibiotic start and stop dates in addition to other pertinent events   8/26 admitted to [facility] 8/28 worsening encephalopathy and possible aspiration event leading to worsening hypoxia; pccm consulted and required intubation.   Interim History / Subjective:  Remains intubated Off pressors Dopplerable pulses both feet 1.5L out yesterday Failed wean due to somnolence this morning.   Objective   Blood pressure 95/73, pulse 86, temperature 97.6 F (36.4 C), temperature source Axillary, resp. rate 18, height 5\' 5"  (1.651 m), weight 44.9 kg, SpO2 96%.    Vent Mode: PRVC FiO2 (%):  [40 %] 40 % Set Rate:  [20 bmp] 20 bmp Vt Set:  [460 mL] 460 mL PEEP:  [5 cmH20] 5 cmH20 Plateau Pressure:   [16 cmH20-18 cmH20] 16 cmH20   Intake/Output Summary (Last 24 hours) at 03/17/2023 0815 Last data filed at 03/17/2023 0724 Gross per 24 hour  Intake 2045.49 ml  Output 1490 ml  Net 555.49 ml   Filed Weights   03/15/23 0622 03/16/23 0500 03/17/23 0414  Weight: 57.5 kg 49.7 kg 44.9 kg    Examination:  General:  Eldelry appearing female in NAD on vent HEENT: [place]/AT, pERRL, no JVD Neuro: RASS -2 CV: IRIR rate controlled PULM:  Diminished bases GI: Soft, NT ,ND Extremities: Dopplerable pulses in bilateral lowe extremities.  Skin: no rashes or lesions   Labs, imaging 8/26 CTA chest, bifem: Mediastinal nodule, cardiomegaly, biatrial enlargement. Concern for LAA thrombus. Complete occlusion of the L popliteal artery. Occlusion right SFA. Multiple areas of less critical arterial stenosis.  8/28 CT head: chronic changes.   K 3.6, creat 0.98, phos 2.4, WBC 9.4, plt 134   Resolved Hospital Problem list     Assessment & Plan:   Critical limb ischemia right lower extremity -vascular surgery following -cont heparin -Stent placed R SFA yesterday.  -Vascular checks  Acute respiratory failure w/ hypoxia Probable aspiration: trach aspirate growing few GPC > MRSA PCR negative -VAP bundle in place -Daily SAT and SBT -PAD protocol in place. Precedex and PRN fentanyl for RASS goal -1 to -2.  -trach aspirate pending -unasyn for asp ppx - stop date placed for 5 days total. -hoping to extubate this morning.   Acute encephalopathy: CT head negative. Hypoxia, hypercarbia likely contributing factors. Ammonia 42 - delirium precautions - Daily  WUA - Sedation as above  Hypotension post procedure HTN baseline Plan: -cardene and metoprolol dc'd - levo off this morning  PAF RVR Hypokalemia Plan: -cards following -tele monitoring -heparin -hold metoprolol hypotension  Systolic CHF chronic HTN -echo lvef 40-45% Plan: -GDMT on hold  -daily weights; strict I/o's  DMT2 -A1c this  admission 6.6 Plan: -ssi and cbg monitoring  Hx of dvt Plan: -heparin  Lesion of mediastinum -incidental finding on CT chest of nodule on anterior mediastinum Plan: -f/u outpt   Family updated at bedside.   Best Practice (right click and "Reselect all SmartList Selections" daily)   Diet/type: NPO DVT prophylaxis: systemic heparin GI prophylaxis: PPI Lines: N/A Foley:  N/A Code Status:  full code Last date of multidisciplinary goals of care discussion [8/28 family updated by Dr. Delia Chimes that patient is requiring intubation and is okay with [place] to proceed]   Critical care time: 41 minutes      Joneen Roach, AGACNP-BC [facility]  See Amion for personal pager PCCM on call pager [phone] until 7pm. Please call Elink 7p-7a. [phone]  03/17/2023 8:15 AM

## 2023-03-17 NOTE — Progress Notes (Signed)
[facility] ADULT ICU REPLACEMENT PROTOCOL   The patient does apply for the [facility] Adult ICU Electrolyte Replacment Protocol based on the criteria listed below:   1.Exclusion criteria: TCTS, ECMO, Dialysis, and Myasthenia Gravis patients 2. Is GFR >/= 30 ml/min? Yes.    Patient's GFR today is 56 3. Is SCr </= 2? Yes.   Patient's SCr is 0.98 mg/dL 4. Did SCr increase >/= 0.5 in 24 hours? No. 5.Pt's weight >40kg  Yes.   6. Abnormal electrolyte(s): k 3.6  7. Electrolytes replaced per protocol 8.  Call MD STAT for K+ </= 2.5, Phos </= 1, or Mag </= 1 Physician:    Markus Daft A 03/17/2023 4:20 AM

## 2023-03-17 NOTE — Evaluation (Signed)
Physical Therapy Evaluation Patient Details Name: Sarah Wolf MRN: [id number] DOB: [birth year] Today's Date: 03/17/2023  History of Present Illness  Pt is 88 year old who presents to [facility] on 8/26 with complaint of bilateral lower extremity pain, greatest on right. Pt with critical limb ischemia RLE.  8/28 patient underwent thrombolysis of RLE. Worsening encephalopathy post procedure.Aspiration event leading to hypoxia and worsening AMS.  Intubated 8/28- 8/30 PMH - HTN, paroxysmal atrial fibrillation, history of DVT no longer on anticoagulation  Clinical Impression  Pt admitted with above diagnosis and presents to PT with functional limitations due to deficits listed below (See PT problem list). Pt needs skilled PT to maximize independence and safety. Patient will benefit from continued inpatient follow up therapy, <3 hours/day           If plan is discharge home, recommend the following: A lot of help with walking and/or transfers;A lot of help with bathing/dressing/bathroom;Assistance with cooking/housework;Assist for transportation;Help with stairs or ramp for entrance   Can travel by private vehicle   No    Equipment Recommendations Rolling walker (2 wheels)  Recommendations for Other Services       Functional Status Assessment Patient has had a recent decline in their functional status and demonstrates the ability to make significant improvements in function in a reasonable and predictable amount of time.     Precautions / Restrictions Precautions Precautions: Fall Precaution Comments: Active humeral fx to R arm Restrictions Weight Bearing Restrictions: No      Mobility  Bed Mobility Overal bed mobility: Needs Assistance Bed Mobility: Supine to Sit     Supine to sit: Mod assist     General bed mobility comments: Assist to bring legs off of bed, elevate trunk into sitting, and bring hips to EOB    Transfers Overall transfer level: Needs assistance Equipment used:  Ambulation equipment used Transfers: Sit to/from Stand, Bed to chair/wheelchair/BSC Sit to Stand: +2 physical assistance, Min assist           General transfer comment: Assist to bring hips up and for balance Transfer via Lift Equipment: Stedy  Ambulation/Gait                  Stairs            Wheelchair Mobility     Tilt Bed    Modified Rankin (Stroke Patients Only)       Balance Overall balance assessment: Needs assistance Sitting-balance support: No upper extremity supported, Feet supported Sitting balance-Leahy Scale: Fair     Standing balance support: Single extremity supported Standing balance-Leahy Scale: Poor Standing balance comment: Stedy and min assist for static standing                             Pertinent Vitals/Pain Pain Assessment Pain Assessment: Faces Faces Pain Scale: Hurts little more Pain Location: RUE Pain Descriptors / Indicators: Grimacing Pain Intervention(s): Monitored during session, Repositioned    Home Living Family/patient expects to be discharged to:: Private residence Living Arrangements: Alone Available Help at Discharge: Family;Available PRN/intermittently Type of Home: House Home Access: Ramped entrance       Home Layout: One level Home Equipment: Cane - single point      Prior Function Prior Level of Function : Independent/Modified Independent;Driving             Mobility Comments: Using cane at home       Extremity/Trunk Assessment   Upper  Extremity Assessment Upper Extremity Assessment: Defer to OT evaluation    Lower Extremity Assessment Lower Extremity Assessment: Generalized weakness       Communication   Communication Communication: Difficulty communicating thoughts/reduced clarity of speech (low volume)  Cognition Arousal: Alert Behavior During Therapy: Anxious Overall Cognitive Status: No family/caregiver present to determine baseline cognitive functioning                                  General Comments: Following 1 step commands.        General Comments General comments (skin integrity, edema, etc.): VSS on O2    Exercises     Assessment/Plan    PT Assessment Patient needs continued PT services  PT Problem List Decreased strength;Decreased activity tolerance;Decreased balance;Decreased mobility       PT Treatment Interventions DME instruction;Gait training;Functional mobility training;Therapeutic activities;Therapeutic exercise;Balance training;Patient/family education    PT Goals (Current goals can be found in the Care Plan section)  Acute Rehab PT Goals Patient Stated Goal: not stated PT Goal Formulation: With patient Time For Goal Achievement: 03/31/23 Potential to Achieve Goals: Good    Frequency Min 1X/week     Co-evaluation               AM-PAC PT "6 Clicks" Mobility  Outcome Measure Help needed turning from your back to your side while in a flat bed without using bedrails?: A Lot Help needed moving from lying on your back to sitting on the side of a flat bed without using bedrails?: A Lot Help needed moving to and from a bed to a chair (including a wheelchair)?: Total Help needed standing up from a chair using your arms (e.g., wheelchair or bedside chair)?: Total Help needed to walk in hospital room?: Total Help needed climbing 3-5 steps with a railing? : Total 6 Click Score: 8    End of Session Equipment Utilized During Treatment: Oxygen Activity Tolerance: Patient tolerated treatment well Patient left: in chair;with call bell/phone within reach;with chair alarm set Nurse Communication: Mobility status;Need for lift equipment (Nurse assisted) PT Visit Diagnosis: Unsteadiness on feet (R26.81);Other abnormalities of gait and mobility (R26.89);Muscle weakness (generalized) (M62.81)    Time: 2536-6440 PT Time Calculation (min) (ACUTE ONLY): 28 min   Charges:   PT Evaluation $PT Eval Moderate  Complexity: 1 Mod PT Treatments $Gait Training: 8-22 mins PT General Charges $$ ACUTE PT VISIT: 1 Visit         [facility] PT Acute Rehabilitation Services Office [phone]   Angelina Ok [facility] 03/17/2023, 5:10 PM

## 2023-03-17 NOTE — Evaluation (Signed)
Occupational Therapy Evaluation Patient Details Name: Sarah Wolf MRN: [id number] DOB: 03/25/1935 Today's Date: 03/17/2023   History of Present Illness 88 y.o. female with past medical history significant for HTN, paroxysmal atrial fibrillation, history of DVT no longer on anticoagulation who presents to [facility] ED on 8/26 with complaint of bilateral lower extremity pain, greatest on right.  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  8/28 patient underwent thrombolysis of RLE. That evening patient with worsening encephalopathy.  Had aspiration event and became more hypoxic and worsening AMS.  Extubated 8/30   Clinical Impression   Patient admitted for the diagnosis above.  PTA, per nephew, patient lived alone, Ind with sponge baths, walks with a SPC, and stated she still drives.  Patient fractured her upper arm 3 weeks ago, and was being worked up for possible surgical intervention.  Currently she is experiencing a lot of pain, and was able to sit at the EOB with up to Mod A of 2.  OT will follow in the acute setting to address deficits, and Patient will benefit from continued inpatient follow up therapy, <3 hours/day        If plan is discharge home, recommend the following: Two people to help with walking and/or transfers;A lot of help with bathing/dressing/bathroom;Assist for transportation;Assistance with cooking/housework    Functional Status Assessment  Patient has had a recent decline in their functional status and demonstrates the ability to make significant improvements in function in a reasonable and predictable amount of time.  Equipment Recommendations  None recommended by OT    Recommendations for  Other Services       Precautions / Restrictions Precautions Precautions: Fall Precaution Comments: Active humeral fx to R arm Restrictions Weight Bearing Restrictions: No      Mobility Bed Mobility Overal bed mobility: Needs Assistance Bed Mobility: Supine to Sit, Sit to Supine     Supine to sit: Max assist Sit to supine: Max assist, +2 for physical assistance     Patient Response: Cooperative  Transfers                   General transfer comment: unable to stand      Balance Overall balance assessment: Needs assistance Sitting-balance support: Feet supported Sitting balance-Leahy Scale: Poor   Postural control: Posterior lean                                 ADL either performed or assessed with clinical judgement   ADL Overall ADL's : Needs assistance/impaired Eating/Feeding: NPO   Grooming: Wash/dry hands;Wash/dry face;Minimal assistance;Bed level   Upper Body Bathing: Moderate assistance;Bed level   Lower Body Bathing: Total assistance;Bed level   Upper Body Dressing : Moderate assistance;Bed level   Lower Body Dressing: Total assistance;Bed level                       Vision   Vision Assessment?: No apparent visual deficits     Perception Perception: Not tested       Praxis Praxis: Not tested       Pertinent Vitals/Pain Pain Assessment Pain Assessment: Faces Faces Pain Scale: Hurts whole lot Pain Location: R  arm and R leg Pain Descriptors / Indicators: Crying Pain Intervention(s): Monitored during session     Extremity/Trunk Assessment Upper Extremity Assessment Upper Extremity Assessment: Right hand dominant;RUE deficits/detail RUE Deficits / Details: Humeral fx being worked up for possible sx by PCP RUE Coordination: decreased gross motor   Lower Extremity Assessment Lower Extremity Assessment: Defer to PT evaluation   Cervical / Trunk Assessment Cervical / Trunk Assessment: Kyphotic    Communication Communication Communication: Difficulty communicating thoughts/reduced clarity of speech   Cognition Arousal: Alert Behavior During Therapy: Restless, Agitated, Anxious Overall Cognitive Status: Difficult to assess                                       General Comments   Watch BP    Exercises     Shoulder Instructions      Home Living Family/patient expects to be discharged to:: Private residence Living Arrangements: Alone Available Help at Discharge: Family;Available PRN/intermittently Type of Home: House Home Access: Ramped entrance     Home Layout: One level     Bathroom Shower/Tub: Chief Strategy Officer: Standard Bathroom Accessibility: Yes   Home Equipment: Cane - single point          Prior Functioning/Environment Prior Level of Function : Independent/Modified Independent;Driving                        OT Problem List: Decreased strength;Decreased range of motion;Impaired balance (sitting and/or standing);Pain;Decreased safety awareness      OT Treatment/Interventions: Self-care/ADL training;Therapeutic activities;Balance training;DME and/or AE instruction;Patient/family education    OT Goals(Current goals can be found in the care plan section) Acute Rehab OT Goals Patient Stated Goal: Return home OT Goal Formulation: With patient Time For Goal Achievement: 03/31/23 Potential to Achieve Goals: Fair ADL Goals Pt Will Perform Grooming: with set-up;sitting Pt Will Perform Upper Body Bathing: with set-up;sitting Pt Will Perform Upper Body Dressing: with set-up;sitting Pt Will Transfer to Toilet: with mod assist;stand pivot transfer;bedside commode  OT Frequency: Min 1X/week    Co-evaluation              AM-PAC OT "6 Clicks" Daily Activity     Outcome Measure Help from another person eating meals?: Total Help from another person taking care of personal grooming?: A Little Help from another  person toileting, which includes using toliet, bedpan, or urinal?: Total Help from another person bathing (including washing, rinsing, drying)?: A Lot Help from another person to put on and taking off regular upper body clothing?: A Lot Help from another person to put on and taking off regular lower body clothing?: Total 6 Click Score: 10   End of Session Nurse Communication: Mobility status  Activity Tolerance: Patient limited by pain Patient left: in bed;with call bell/phone within reach;with family/visitor present  OT Visit Diagnosis: Unsteadiness on feet (R26.81);Muscle weakness (generalized) (M62.81);Pain Pain - Right/Left: Right Pain - part of body: Shoulder                Time: 1610-9604 OT Time Calculation (min): 25 min Charges:  OT General Charges $OT Visit: 1 Visit OT Evaluation $OT Eval Moderate Complexity: 1 Mod OT Treatments $Therapeutic Activity: 8-22 mins  03/17/2023  RP, OTR/L  Acute Rehabilitation Services  Office:  [phone]   Suzanna Obey 03/17/2023, 10:00 AM

## 2023-03-17 NOTE — Procedures (Signed)
Patient Name: Twila Job  MRN: [id number]  Epilepsy Attending: Charlsie Quest  Referring Physician/Provider: Duayne Cal, NP  Date: 03/17/2023 Duration: 22.36 mins  Patient history: 88yo F with ams getting eeg to evaluate for seizure  Level of alertness: Awake  AEDs during EEG study: None  Technical aspects: This EEG study was done with scalp electrodes positioned according to the 10-20 International system of electrode placement. Electrical activity was reviewed with band pass filter of 1-70Hz , sensitivity of 7 uV/mm, display speed of 31mm/sec with a 60Hz  notched filter applied as appropriate. EEG data were recorded continuously and digitally stored.  Video monitoring was available and reviewed as appropriate.  Description: EEG showed continuous generalized predominantly 5 to 6 Hz theta admixed with 2-3Hz  delta slowing. Hyperventilation and photic stimulation were not performed.     ABNORMALITY - Continuous slow, generalized  IMPRESSION: This study is suggestive of moderate diffuse encephalopathy. No seizures or epileptiform discharges were seen throughout the recording.  Gemini Beaumier Annabelle Harman

## 2023-03-17 NOTE — Evaluation (Signed)
Clinical/Bedside Swallow Evaluation Patient Details  Name: Sybrina Stoops MRN: [id number] Date of Birth: [birth year]  Today's Date: 03/17/2023 Time: SLP Start Time (ACUTE ONLY): 1410 SLP Stop Time (ACUTE ONLY): 1430 SLP Time Calculation (min) (ACUTE ONLY): 20 min  Past Medical History: History reviewed. No pertinent past medical history. Past Surgical History:  Past Surgical History:  Procedure Laterality Date   ABDOMINAL AORTOGRAM W/LOWER EXTREMITY N/A 03/15/2023   Procedure: ABDOMINAL AORTOGRAM W/LOWER EXTREMITY;  Surgeon: Victorino Sparrow, MD;  Location: [facility] INVASIVE CV LAB;  Service: Cardiovascular;  Laterality: N/A;   ORIF FEMUR FRACTURE Right 03/08/2019   Procedure: OPEN REDUCTION INTERNAL FIXATION (ORIF) DISTAL FEMUR FRACTURE;  Surgeon: Kathryne Hitch, MD;  Location: [facility] ORS;  Service: Orthopedics;  Laterality: Right;   PERIPHERAL VASCULAR INTERVENTION Right 03/16/2023   Procedure: PERIPHERAL VASCULAR INTERVENTION;  Surgeon: Cephus Shelling, MD;  Location: [facility] INVASIVE CV LAB;  Service: Cardiovascular;  Laterality: Right;  SFA   PERIPHERAL VASCULAR THROMBECTOMY N/A 03/16/2023   Procedure: LYSIS RECHECK;  Surgeon: Cephus Shelling, MD;  Location: [facility] INVASIVE CV LAB;  Service: Cardiovascular;  Laterality: N/A;   HPI:  Patient is a 88 yo F w/ pertinent PMH PAF, HTN, DVT 2020 no longer on DOAC presents to [facility] on 8/26 w/ critical limb ischemia RLE.     Patient complaining of worsening BLE pain worse on right. Patient noted to be in afib RVR rates 140s. SBP 160s. BNP 799. DVT US no signs of dvt but occlusion of proximal mid SFA. 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 Vasc surgery and cards consulted. Started on heparin drip and given metoprolol. Started on diltiazem drip.  TRH admitted to floor.      On 8/28 patient underwent thrombolysis of RLE. That evening patient with worsening encephalopathy. Had aspiration event (unsure if related to PO intake or vomiting or secretions) and became more hypoxic and worsening AMS. intubated 8/28-8/30.    Assessment / Plan / Recommendation  Clinical Impression  Pt demonstrates aphonia. She is a terrible historian often contradicting herself and unable to participate in reasoning about health and precautions.  She is attentive and alert and able to feed herself. Family are at bedside. Pt has a strong cough prior to PO and coughed once during assessment after a sip of water. Subsequently she passed the [place] 3 oz water swallow and consumed a cup of puree without difficulty. Will recommend initiating a dys 2 (finely chopped) diet with thin liquids with close staff supervision. Make pt NPO if coughing increases. SLP will f/u for tolerance and advancement. SLP Visit Diagnosis: Dysphagia, unspecified (R13.10)    Aspiration Risk  Mild aspiration risk    Diet Recommendation Dysphagia 2 (Fine chop);Thin liquid    Liquid Administration via: Straw;Cup Medication Administration: Whole meds with puree Supervision: Patient able to self feed Compensations: Slow rate;Small sips/bites Postural Changes: Seated upright at 90 degrees    Other  Recommendations      Recommendations for follow up therapy are one component of a multi-disciplinary discharge planning process, led by the attending physician.  Recommendations may be updated based on patient status, additional functional criteria and insurance authorization.  Follow up Recommendations Skilled nursing-short term rehab (<3 hours/day)      Assistance Recommended at Discharge    Functional Status Assessment Patient has had a recent  decline in their functional status and demonstrates the ability to make significant improvements in function in a reasonable and predictable amount of time.   Frequency and Duration min 2x/week  2 weeks       Prognosis Prognosis for improved oropharyngeal function: Good      Swallow Study   General HPI: Patient is a 88 yo F w/ pertinent PMH PAF, HTN, DVT 2020 no longer on DOAC presents to [facility] on 8/26 w/ critical limb ischemia RLE.     Patient complaining of worsening BLE pain worse on right. Patient noted to be in afib RVR rates 140s. SBP 160s. BNP 799. DVT US no signs of dvt but occlusion of proximal mid SFA. 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 Vasc surgery and cards consulted. Started on heparin drip and given metoprolol. Started on diltiazem drip. TRH admitted to floor.      On 8/28 patient underwent thrombolysis of RLE. That evening patient with worsening encephalopathy. Had aspiration event (unsure if related to PO intake or vomiting or secretions) and became more hypoxic and worsening AMS. intubated 8/28-8/30. Type of Study: Bedside Swallow Evaluation Diet Prior to this Study: NPO Temperature Spikes Noted: No Respiratory Status: Nasal cannula History of Recent Intubation: Yes Total duration of intubation (days): 3 days Behavior/Cognition: Alert;Cooperative;Pleasant mood Oral Cavity Assessment: Within Functional Limits Oral Care Completed by SLP: No Oral Cavity - Dentition: Edentulous Vision: Functional for self-feeding Self-Feeding Abilities: Able to feed self Patient Positioning: Upright in bed Baseline Vocal Quality: Aphonic Volitional Cough: Strong Volitional Swallow: Able to elicit    Oral/Motor/Sensory Function Overall Oral Motor/Sensory Function: Within functional limits   Ice Chips     Thin Liquid Thin Liquid: Within functional limits Presentation: Straw;Self Fed    Nectar Thick Nectar Thick Liquid: Not  tested   Honey Thick Honey Thick Liquid: Not tested   Puree Puree: Within functional limits Presentation: Self Fed   Solid     Solid: Not tested      Damarion Mendizabal, Riley Nearing 03/17/2023,2:56 PM

## 2023-03-17 NOTE — Procedures (Signed)
Extubation Procedure Note  Patient Details:   Name: Sarah Wolf DOB: 1934-08-19 MRN: [id number]   Airway Documentation:    Vent end date: 03/17/23 Vent end time: 0840   Evaluation  O2 sats: stable throughout Complications: No apparent complications Patient did tolerate procedure well. Bilateral Breath Sounds: Clear, Diminished   Yes  Patient extubated per NP order. Positive cuff leak. No stridor noted. Vitals are stable on 2L [place]. RN and NP at bedside.  Harmon Dun Evolette Pendell 03/17/2023, 8:46 AM

## 2023-03-18 DIAGNOSIS — I70221 Atherosclerosis of native arteries of extremities with rest pain, right leg: Secondary | ICD-10-CM | POA: Diagnosis not present

## 2023-03-18 DIAGNOSIS — I5023 Acute on chronic systolic (congestive) heart failure: Secondary | ICD-10-CM

## 2023-03-18 DIAGNOSIS — I4891 Unspecified atrial fibrillation: Secondary | ICD-10-CM | POA: Diagnosis not present

## 2023-03-18 LAB — PHOSPHORUS: Phosphorus: 2.1 mg/dL — ABNORMAL LOW (ref 2.5–4.6)

## 2023-03-18 LAB — CBC
HCT: 33.2 % — ABNORMAL LOW (ref 36.0–46.0)
Hemoglobin: 10.8 g/dL — ABNORMAL LOW (ref 12.0–15.0)
MCH: 31.8 pg (ref 26.0–34.0)
MCHC: 32.5 g/dL (ref 30.0–36.0)
MCV: 97.6 fL (ref 80.0–100.0)
Platelets: 134 10*3/uL — ABNORMAL LOW (ref 150–400)
RBC: 3.4 MIL/uL — ABNORMAL LOW (ref 3.87–5.11)
RDW: 15.1 % (ref 11.5–15.5)
WBC: 10.9 10*3/uL — ABNORMAL HIGH (ref 4.0–10.5)
nRBC: 0 % (ref 0.0–0.2)

## 2023-03-18 LAB — GLUCOSE, CAPILLARY
Glucose-Capillary: 108 mg/dL — ABNORMAL HIGH (ref 70–99)
Glucose-Capillary: 109 mg/dL — ABNORMAL HIGH (ref 70–99)
Glucose-Capillary: 116 mg/dL — ABNORMAL HIGH (ref 70–99)
Glucose-Capillary: 128 mg/dL — ABNORMAL HIGH (ref 70–99)

## 2023-03-18 LAB — COMPREHENSIVE METABOLIC PANEL
ALT: 24 U/L (ref 0–44)
AST: 32 U/L (ref 15–41)
Albumin: 2.5 g/dL — ABNORMAL LOW (ref 3.5–5.0)
Alkaline Phosphatase: 39 U/L (ref 38–126)
Anion gap: 10 (ref 5–15)
BUN: 30 mg/dL — ABNORMAL HIGH (ref 8–23)
CO2: 22 mmol/L (ref 22–32)
Calcium: 8.7 mg/dL — ABNORMAL LOW (ref 8.9–10.3)
Chloride: 105 mmol/L (ref 98–111)
Creatinine, Ser: 0.96 mg/dL (ref 0.44–1.00)
GFR, Estimated: 58 mL/min — ABNORMAL LOW (ref >60)
Glucose, Bld: 111 mg/dL — ABNORMAL HIGH (ref 70–99)
Potassium: 4.3 mmol/L (ref 3.5–5.1)
Sodium: 137 mmol/L (ref 135–145)
Total Bilirubin: 1.2 mg/dL (ref 0.3–1.2)
Total Protein: 5.4 g/dL — ABNORMAL LOW (ref 6.5–8.1)

## 2023-03-18 LAB — HEPARIN LEVEL (UNFRACTIONATED): Heparin Unfractionated: 0.61 [IU]/mL (ref 0.30–0.70)

## 2023-03-18 LAB — MAGNESIUM: Magnesium: 2.3 mg/dL (ref 1.7–2.4)

## 2023-03-18 MED ORDER — SODIUM PHOSPHATES 45 MMOLE/15ML IV SOLN
15.0000 mmol | Freq: Once | INTRAVENOUS | Status: AC
  Administered 2023-03-18: 15 mmol via INTRAVENOUS
  Filled 2023-03-18: qty 5

## 2023-03-18 MED ORDER — CARVEDILOL 6.25 MG PO TABS
6.2500 mg | ORAL_TABLET | Freq: Two times a day (BID) | ORAL | Status: DC
  Administered 2023-03-18: 6.25 mg via ORAL
  Filled 2023-03-18: qty 1

## 2023-03-18 MED ORDER — METOPROLOL TARTRATE 25 MG PO TABS
25.0000 mg | ORAL_TABLET | Freq: Four times a day (QID) | ORAL | Status: DC
  Administered 2023-03-18 – 2023-03-19 (×5): 25 mg via ORAL
  Filled 2023-03-18 (×5): qty 1

## 2023-03-18 MED ORDER — APIXABAN 5 MG PO TABS
5.0000 mg | ORAL_TABLET | Freq: Two times a day (BID) | ORAL | Status: DC
  Administered 2023-03-18 – 2023-03-23 (×12): 5 mg via ORAL
  Filled 2023-03-18 (×12): qty 1

## 2023-03-18 MED ORDER — SODIUM CHLORIDE 0.9 % IV SOLN
2.0000 g | INTRAVENOUS | Status: AC
  Administered 2023-03-18 – 2023-03-21 (×4): 2 g via INTRAVENOUS
  Filled 2023-03-18 (×4): qty 20

## 2023-03-18 NOTE — Progress Notes (Signed)
NAMEAishat Wolf, MRN:  [id number], DOB:  July 25, 1934, LOS: 5 ADMISSION DATE:  03/13/2023, CONSULTATION DATE:  8/28 REFERRING MD:  hospitalist, CHIEF COMPLAINT:  Acute respiratory failure w/ hypoxia   History of Present Illness:  Patient is a 88 yo F w/ pertinent PMH PAF, HTN, DVT 2020 no longer on DOAC presents to [facility] on 8/26 w/ critical limb ischemia RLE.  Patient complaining of worsening BLE pain worse on right. Patient noted to be in afib RVR rates 140s. SBP 160s. BNP 799. DVT US no signs of dvt but occlusion of proximal mid SFA. 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 Vasc surgery and cards consulted. Started on heparin drip and given metoprolol. Started on diltiazem drip. TRH admitted to floor.   On 8/28 patient underwent thrombolysis of RLE. That evening patient with worsening encephalopathy. Had aspiration event and became more hypoxic and worsening AMS. PCCM consulted.  Pertinent  Medical History  PAF DVT 2020 no longer on [facility] HTN  Significant Hospital Events: Including procedures, antibiotic start and stop dates in addition to other pertinent events   8/26 admitted to [facility] 8/28 worsening encephalopathy and possible aspiration event leading to worsening hypoxia; pccm consulted and required intubation.   Interim History / Subjective:  Off vent in good spirits. In afib with variable RVR BP also creeping up  Objective   Blood pressure (!) 148/125, pulse (!) 129, temperature 97.9 F (36.6 C), temperature source Oral, resp. rate 18, height 5\' 2"  (1.575 m), weight 52.9 kg, SpO2 99%.    Vent Mode: PRVC FiO2 (%):  [40 %] 40 % Set Rate:  [20 bmp] 20 bmp Vt Set:  [460 mL] 460 mL PEEP:  [5 cmH20] 5 cmH20 Plateau Pressure:  [16 cmH20] 16 cmH20   Intake/Output Summary  (Last 24 hours) at 03/18/2023 0727 Last data filed at 03/18/2023 0600 Gross per 24 hour  Intake 1697.57 ml  Output 510 ml  Net 1187.57 ml   Filed Weights   03/17/23 0414 03/17/23 1100 03/18/23 0500  Weight: 44.9 kg 58.3 kg 52.9 kg    Examination:  No distress Moves to command Aox3 Eating breakfast Globally weak Ext warm, pulses good Access sites look okay  DC finersticks Cr ok Phos mildly down   Resolved Hospital Problem list     Assessment & Plan:   Critical limb ischemia right lower extremity -vascular surgery following -switch heparin to [facility] -Stent placed R SFA 8/29.  -Vascular checks  Acute respiratory failure w/ hypoxia Probable aspiration: trach aspirate growing few GPC > MRSA PCR negative - Ceftriaxone x 4 more days, still coughing up some bloody/green material; trach asp normal flora - Encourage mobility and IS - Wean O2 for sats > 90%  Acute encephalopathy: CT head negative. Hypoxia, hypercarbia likely contributing factors.  Query absence seizures- 8/30, seem to have resolved, EEG neg; ammonia WNL -All improved, encourage day/night cycles, re orient PRN  PAF RVR Hypotension post procedure HTN baseline Plan: - Resume coreg - PRN labetalol - Will probably need to resume dilt as well depending on HR response to coreg  Systolic CHF chronic HTN -echo lvef 40-45% Plan: - GDMT per cardiology team  DMT2 -A1c this admission 6.6 Plan: Fingersticks have been normal, DC for now  Hx of dvt Plan: -DoAC  Lesion of mediastinum -incidental finding on CT  chest of nodule on anterior mediastinum Plan: -f/u outpt   Urinary retention - Take out foley, bladder scans  Patient updated at bedside.  Stable to transfer to progressive, appreciate TRH taking over starting 03/19/23.  Remaining issues are O2 wean, PT/OT recs, Afib management, and any cardiology plans   Myrla Halsted MD [facility]  See Amion for personal pager PCCM on call  pager [phone] until 7pm. Please call Elink 7p-7a. [phone]  03/18/2023 7:27 AM

## 2023-03-18 NOTE — Progress Notes (Signed)
ANTICOAGULATION CONSULT NOTE- Follow Up Pharmacy Consult for heparin > Eliquis Indication: atrial fibrillation  No Known Allergies  Patient Measurements: Height: 5\' 2"  (157.5 cm) Weight: 52.9 kg (116 lb 10 oz) IBW/kg (Calculated) : 50.1 Heparin Dosing Weight: TBW  Vital Signs: Temp: 97.9 F (36.6 C) (08/31 0300) Temp Source: Oral (08/31 0300) BP: 148/125 (08/31 0700) Pulse Rate: 129 (08/31 0700)  Labs: Recent Labs    03/16/23 0809 03/16/23 1055 03/17/23 0244 03/17/23 1236 03/17/23 1955 03/18/23 0209  HGB 11.3*  --  11.3*  --   --  10.8*  HCT 35.2*  --  35.4*  --   --  33.2*  PLT 118*  --  134*  --   --  134*  HEPARINUNFRC  --  0.18* <0.10* 0.55 0.36 0.61  CREATININE 0.87 0.99 0.98  --   --  0.96    Estimated Creatinine Clearance: 33.3 mL/min (by C-G formula based on SCr of 0.96 mg/dL).  Assessment: 88 y.o. female with Afib w/ possible LAA thrombus and occlusion of LE artery on heparin s/p catheter-directed thrombolysis now plan to switch to Eliquis after passing swallow study.  Goal of Therapy:  Heparin level: 0.3 -0.7 units/ml Monitor platelets by anticoagulation protocol: Yes   Plan:  Stop heparin infusion Start Eliquis 5 mg BID (copay = $47)  Trixie Rude, PharmD Clinical Pharmacist 03/18/2023  7:38 AM  Please check AMION for all [facility] phone numbers After 10:00 PM, call Main Pharmacy [phone]

## 2023-03-18 NOTE — Progress Notes (Signed)
Patient arrived at the unit,CHG bath given, vitals taken,CCMD notified,pt is in Afib with heart rate in 120s, bilateral dorsalis pulse doppler,pt oriented to the unit

## 2023-03-18 NOTE — Discharge Instructions (Signed)

## 2023-03-18 NOTE — Progress Notes (Signed)
Rounding Note    Patient Name: Sarah Wolf Date of Encounter: 03/18/2023  [facility] Cardiologist: Dietrich Pates, MD   Subjective   She feels ok this AM    Inpatient Medications    Scheduled Meds:  apixaban  5 mg Oral BID   aspirin  81 mg Per Tube Daily   atorvastatin  40 mg Per Tube Daily   carvedilol  6.25 mg Oral BID WC   Chlorhexidine Gluconate Cloth  6 each Topical Daily   feeding supplement  237 mL Oral BID BM   pneumococcal 20-valent conjugate vaccine  0.5 mL Intramuscular Tomorrow-1000   sodium chloride flush  3 mL Intravenous Q12H   sodium chloride flush  3 mL Intravenous Q12H   sodium chloride flush  3 mL Intravenous Q12H   Continuous Infusions:  sodium chloride     sodium chloride 10 mL/hr at 03/18/23 0700   sodium chloride 10 mL/hr at 03/16/23 1400   sodium chloride     cefTRIAXone (ROCEPHIN)  IV 2 g (03/18/23 0917)   sodium phosphate 15 mmol in dextrose 5 % 250 mL infusion 15 mmol (03/18/23 0926)   PRN Meds: sodium chloride, sodium chloride, acetaminophen **OR** acetaminophen, acetaminophen, albuterol, guaiFENesin-dextromethorphan, hydrALAZINE, hydrALAZINE, HYDROcodone-acetaminophen, labetalol, labetalol, ondansetron (ZOFRAN) IV, ondansetron (ZOFRAN) IV, mouth rinse, phenol, sodium chloride flush, sodium chloride flush   Vital Signs    Vitals:   03/18/23 0700 03/18/23 0800 03/18/23 0811 03/18/23 0913  BP: (!) 148/125 (!) 145/113  (!) 156/112  Pulse: (!) 129 (!) 128  (!) 112  Resp: 18 (!) 23    Temp:   98.1 F (36.7 C)   TempSrc:   Oral   SpO2: 99% 96%    Weight:      Height:        Intake/Output Summary (Last 24 hours) at 03/18/2023 0936 Last data filed at 03/18/2023 0724 Gross per 24 hour  Intake 1572.15 ml  Output 685 ml  Net 887.15 ml      03/18/2023    5:00 AM 03/17/2023   11:00 AM 03/17/2023    4:14 AM  Last 3 Weights  Weight (lbs) 116 lb 10 oz 128 lb 8.5 oz 98 lb 15.8 oz  Weight (kg) 52.9 kg 58.3 kg 44.9 kg      Telemetry     Atrial fibrillation up to 150; mostly 120 bpm or less   . - Personally Reviewed  ECG    No new ECG tracing . - Personally Reviewed  Physical Exam   GEN: on O2 Pulm: no wob, decreased BS in the bases  Neck: No JVD. Cardiac: Irregularly irregular rhythm  No murmrus  Respiratory:  mild rhonchi   Ext   No LE edema   Skin: warm and well perfused.  Vasc: pulses challenging to palpate DP, but skin is warm and pink   Labs    High Sensitivity Troponin:   Recent Labs  Lab 03/13/23 1030 03/13/23 1235  TROPONINIHS 55* 57*     Chemistry Recent Labs  Lab 03/15/23 1027 03/15/23 2344 03/16/23 1055 03/17/23 0244 03/18/23 0209  NA 135   < > 136 139 137  K 3.2*   < > 3.5 3.6 4.3  CL 98   < > 101 106 105  CO2 28   < > 22 22 22   GLUCOSE 102*   < > 118* 143* 111*  BUN 16   < > 21 24* 30*  CREATININE 0.74   < > 0.99 0.98  0.96  CALCIUM 8.4*   < > 8.1* 8.5* 8.7*  MG  --    < > 1.7 2.4 2.3  PROT 5.3*  --  4.9*  --  5.4*  ALBUMIN 2.7*  --  2.3*  --  2.5*  AST 27  --  24  --  32  ALT 22  --  19  --  24  ALKPHOS 44  --  37*  --  39  BILITOT 1.4*  --  2.4*  --  1.2  GFRNONAA >60   < > 56* 56* 58*  ANIONGAP 9   < > 13 11 10    < > = values in this interval not displayed.    Lipids  Recent Labs  Lab 03/13/23 1559  CHOL 153  TRIG 40  HDL 47  LDLCALC 98  CHOLHDL 3.3    Hematology Recent Labs  Lab 03/16/23 0809 03/17/23 0244 03/18/23 0209  WBC 9.3 9.4 10.9*  RBC 3.52* 3.61* 3.40*  HGB 11.3* 11.3* 10.8*  HCT 35.2* 35.4* 33.2*  MCV 100.0 98.1 97.6  MCH 32.1 31.3 31.8  MCHC 32.1 31.9 32.5  RDW 14.2 14.6 15.1  PLT 118* 134* 134*   Thyroid  Recent Labs  Lab 03/13/23 1030  TSH 2.940    BNP Recent Labs  Lab 03/13/23 1030  BNP 799.9*    DDimer No results for input(s): "DDIMER" in the last 168 hours.   Radiology    EEG adult  Result Date: 03/17/2023 Charlsie Quest, MD     03/17/2023 11:02 PM Patient Name: Sarah Wolf MRN: [id number] Epilepsy Attending:  Charlsie Quest Referring Physician/Provider: Duayne Cal, NP Date: 03/17/2023 Duration: 22.36 mins Patient history: 88yo F with ams getting eeg to evaluate for seizure Level of alertness: Awake AEDs during EEG study: None Technical aspects: This EEG study was done with scalp electrodes positioned according to the 10-20 International system of electrode placement. Electrical activity was reviewed with band pass filter of 1-70Hz , sensitivity of 7 uV/mm, display speed of 36mm/sec with a 60Hz  notched filter applied as appropriate. EEG data were recorded continuously and digitally stored.  Video monitoring was available and reviewed as appropriate. Description: EEG showed continuous generalized predominantly 5 to 6 Hz theta admixed with 2-3Hz  delta slowing. Hyperventilation and photic stimulation were not performed.   ABNORMALITY - Continuous slow, generalized IMPRESSION: This study is suggestive of moderate diffuse encephalopathy. No seizures or epileptiform discharges were seen throughout the recording. Charlsie Quest   DG CHEST PORT 1 VIEW  Result Date: 03/17/2023 CLINICAL DATA:  Aspiration EXAM: PORTABLE CHEST 1 VIEW COMPARISON:  03/16/2023 FINDINGS: Unchanged AP portable chest radiograph. Endotracheal tube positioned over the midtrachea. Esophagogastric tube with tip and side port positioned within a large left-sided paraesophageal hernia. Cardiomegaly with unchanged layering pleural effusions and mild diffuse interstitial opacity. No focal airspace opacity. Osseous structures unremarkable. IMPRESSION: 1. Esophagogastric tube with tip and side port positioned within a large left-sided paraesophageal hernia. 2. Endotracheal tube positioned over the midtrachea. 3. Cardiomegaly with unchanged layering pleural effusions and mild diffuse interstitial opacity, consistent with edema. No focal airspace opacity. Electronically Signed   By: Jearld Lesch M.D.   On: 03/17/2023 08:47   PERIPHERAL VASCULAR  CATHETERIZATION  Result Date: 03/16/2023 Images from the original result were not included.   Patient name: Sarah Wolf         MRN: [id number]        DOB: 12/02/1934  Sex: female  03/16/2023 Pre-operative Diagnosis: Acute on chronic limb ischemia right lower extremity Post-operative diagnosis:  Same Surgeon:  Cephus Shelling, MD Assistant: Carolynn Sayers, MD Procedure Performed: 1.  Thrombolysis catheter check right lower extremity 2.  Right SFA stenting with angioplasty x 2 (6 mm x 5 cm Viabahn and 6 mm x 40 drug-coated Eluvia) 3.  Right lower extremity arteriogram 4.  Perclose closure of left common femoral artery  Indications: 88 year old female presented with likely acute on chronic limb ischemia of the right lower extremity on Monday night.  She underwent placement of a thrombolytics catheter yesterday.  She now presents for thrombolytics catheter check of the right lower extremity after risks and benefits discussed with family.  Findings:  A V18 wire was placed down the right leg UniFuse catheter.  The common femoral and profunda are now widely patent.  The SFA is patent and there were two lesions in the SFA.  One was a focal high-grade stenosis over 90% in the proximal SFA with some associated acute thrombus that was treated with a 6 mm x 5 cm Viabahn.  Distally there was a second 50% stenosis in the SFA treated with a 6 mm x 40 mm drug-coated Eluvia.  She now had inline flow into the foot through the posterior tibial with filling of the anterior tibial although this is occluded proximally.             Procedure:  The patient was taken to room 8 from the ICU intubated.  The patient was then placed supine on the table and prepped and draped in the usual sterile fashion.  A time out was called.  A V18 wire was used to place down the UniFuse catheter in the right leg and the UniFuse catheter was removed.  We gave additional 2000 units of IV heparin.  ACT was checked to maintain goal >250 and  additional heparin was given during the case.  We then got hand-injection of the right lower extremity from the sheath in the left groin over the aortic bifurcation to include lower extremity runoff.  Ultimately she now had inline flow through the posterior tibial.  There were multiple lesions identified in the SFA as noted above.  The first SFA lesion that was high grade >90% was treated with a 6 mm x 5 cm Viabahn postdilated with a 5 mm balloon.  There was a second lesion more distal in the SFA and this was treated with a 6 mm x 40 mm drug-coated Eluvia postdilated with a 5 mm balloon for a >50% stenosis.  She now has inline flow down the posterior tibial.  Wires and catheters were removed.  A Perclose device was placed in the left groin.  Taken back to the ICU intubated.   Plan: Resume heparin in 6 hours after sheath removal.   Cephus Shelling, MD [facility] Office: [phone]   DG CHEST PORT 1 VIEW  Result Date: 03/16/2023 CLINICAL DATA:  Acute congestive heart failure. EXAM: PORTABLE CHEST 1 VIEW COMPARISON:  March 15, 2023. FINDINGS: Mild cardiomegaly is noted. Endotracheal and nasogastric tubes are unchanged in position. Increased bilateral perihilar and basilar opacities are noted concerning for worsening edema or atelectasis with increased associated pleural effusions. Bony thorax is unremarkable. IMPRESSION: Stable support apparatus. Increased bilateral edema or atelectasis with associated pleural effusions. Electronically Signed   By: Lupita Raider M.D.   On: 03/16/2023 10:59    Cardiac Studies   Echocardiogram  03/14/2023: Impressions: 1. Left ventricular ejection fraction, by estimation, is 40 to 45%. The  left ventricle has mildly decreased function. Left ventricular endocardial  border not optimally defined to evaluate regional wall motion. Left  ventricular diastolic function could  not be evaluated.   2. Right ventricular systolic function was  not well visualized. The right  ventricular size is dilated.   3. A small pericardial effusion is present. The pericardial effusion is  surrounding the apex.   4. The mitral valve is grossly normal. Trivial mitral valve  regurgitation. No evidence of mitral stenosis.   5. The aortic valve was not well visualized. Aortic valve regurgitation  is not visualized.   6. Cannot exclude a small PFO.   7. Technically difficult study.   Comparison(s): Unable to view prior study. LVEF decreased from prior  reporting.  _______________  Lower Extremity Arterial Ultrasound 03/14/2023: Summary:  Right: Total occlusion noted in the superficial femoral artery.  Reconstitution of the distal SFA via collateral. Dampened flow noted in  the posterior tibial and anterior tibial ateries. Unable to detect flow in  the peroneal artery.   Left: Total occlusion noted in the popliteal artery. Reconstitution of the  proximal PTA via collateral. Monophasic flow noted throughout the anterior  tibial and peroneal arteries.   ABI Summary: Right: Resting right ankle-brachial index indicates critical limb  ischemia.   Left: Resting left ankle-brachial index indicates moderate left lower  extremity arterial disease. The left toe-brachial index is abnormal.    Peripheral Thrombectomy 03/16/2023 The SFA is patent and there were two lesions in the SFA. One was a focal high-grade stenosis over 90% in the proximal SFA with some associated acute thrombus that was treated with a 6 mm x 5 cm Viabahn. Distally there was a second 50% stenosis in the SFA treated with a 6 mm x 40 mm drug-coated Eluvia. She now had inline flow into the foot through the posterior tibial with filling of the anterior tibial although this is occluded proximally.    Patient Profile     88 y.o. female with a history of paroxysmal atrial fibrillation noted on Echo in 2020, hypertension, prior DVT in 2020 after femur fracture (completed course of  anticoagulation), and multiple falls who was admitted on 03/13/2023 for critical limb ischemia after presenting with leg pain. CTA showed complete occlusion of the left popliteal artery and right SFA (possible embolic phenomenon). Also noted to be in rapid atrial fibrillation and acute CHF for which Cardiology was consulted.  Assessment & Plan    Atrial Fibrillation  -->rates in  ok control Paroxysmal atrial fibrillation was noted on an Echo in 2020 but she was never seen by Cardiology.  Presented at this hospitalization with Afib with RVR   Initially on IV amiodarone   Stopped due to concern for possible cardioversion on amio (CTA of chest-- LA appendage did not fill therefore  could not rule out thrombus)  Pt was switched to IV diltiazem.Now off of this      Rates this am off of meds were good   They have since crept up to 100s / 110s    SBP 100s/ - s/p IV digoxin 0.125  - started metop 25 mg q6H, hold coreg Continue heparin.   Acute on Chronic HFrEF Echo showed LVEF of 40-45% but endocardial border was not optimally defined to evaluate for regional wall motion abnormalities.  I have reviewed  LVEF 45%  Basal inferior wall appears hypokinetic RV function  normal  to mildly decreased    Volume status overall not bad    Pulmonary   Pt appears to have aspirated secretions after vascular procedure     Developed resp distress   Intubated    She is now  extubated  Appears comfortable  CCM following    PAD CTA showed completed occlusion of left popliteal artery and right SFA as well as CTO of right internal iliac artery. Lower extremity arterial ultrasounds showed also showed total occlusion of left popliteal artery and right SFA. ABIs inidicative of critical limb ischemia on the right and moderate disease on the left. Pt underwent cath with thrombolysis Now on heparin   Plan to transition to NOAC    HL   Pt is on atorvastatin 40 mg     For questions or updates, please contact [facility] Please consult [email or web address] for contact info under        Signed, Maisie Fus, MD  03/18/2023, 9:36 AM

## 2023-03-18 NOTE — Progress Notes (Signed)
  Progress Note    03/18/2023 1:07 PM 2 Days Post-Op  Subjective: Without lower extremity complaints  Vitals:   03/18/23 1133 03/18/23 1152  BP: (!) 160/89 109/81  Pulse: (!) 114 (!) 106  Resp: 20 (!) 23  Temp: 97.8 F (36.6 C)   SpO2: 98% 99%    Physical Exam: Awake alert and oriented Nonlabored respirations Left groin is soft without hematoma Strong right posterior tibial and monophasic left dorsalis pedis signal Both feet are warm and sensory and motor intact  CBC    Component Value Date/Time   WBC 10.9 (H) 03/18/2023 0209   RBC 3.40 (L) 03/18/2023 0209   HGB 10.8 (L) 03/18/2023 0209   HCT 33.2 (L) 03/18/2023 0209   PLT 134 (L) 03/18/2023 0209   MCV 97.6 03/18/2023 0209   MCH 31.8 03/18/2023 0209   MCHC 32.5 03/18/2023 0209   RDW 15.1 03/18/2023 0209   LYMPHSABS 1.2 03/13/2023 1030   MONOABS 0.5 03/13/2023 1030   EOSABS 0.2 03/13/2023 1030   BASOSABS 0.1 03/13/2023 1030    BMET    Component Value Date/Time   NA 137 03/18/2023 0209   K 4.3 03/18/2023 0209   CL 105 03/18/2023 0209   CO2 22 03/18/2023 0209   GLUCOSE 111 (H) 03/18/2023 0209   BUN 30 (H) 03/18/2023 0209   CREATININE 0.96 03/18/2023 0209   CALCIUM 8.7 (L) 03/18/2023 0209   GFRNONAA 58 (L) 03/18/2023 0209   GFRAA >60 03/11/2019 0535    INR    Component Value Date/Time   INR 1.2 03/13/2023 1030     Intake/Output Summary (Last 24 hours) at 03/18/2023 1307 Last data filed at 03/18/2023 1001 Gross per 24 hour  Intake 1328.7 ml  Output 675 ml  Net 653.7 ml     Assessment/plan:  88 y.o. female is s/p right lower extremity revascularization for acute on chronic ischemia.  She is stable from a vascular standpoint and will be discharged on a DOAC for new onset A-fib as well as aspirin.  I will arrange follow-up in 4 to 6 weeks in our office with right lower extremity duplex and ABIs.    Jud Fanguy C. Randie Heinz, MD [facility] Office: [phone] Pager:  [phone]  03/18/2023 1:07 PM

## 2023-03-19 DIAGNOSIS — I5023 Acute on chronic systolic (congestive) heart failure: Secondary | ICD-10-CM | POA: Diagnosis not present

## 2023-03-19 DIAGNOSIS — I70221 Atherosclerosis of native arteries of extremities with rest pain, right leg: Secondary | ICD-10-CM | POA: Diagnosis not present

## 2023-03-19 DIAGNOSIS — I4891 Unspecified atrial fibrillation: Secondary | ICD-10-CM | POA: Diagnosis not present

## 2023-03-19 LAB — CBC
HCT: 31.3 % — ABNORMAL LOW (ref 36.0–46.0)
Hemoglobin: 10.2 g/dL — ABNORMAL LOW (ref 12.0–15.0)
MCH: 32.2 pg (ref 26.0–34.0)
MCHC: 32.6 g/dL (ref 30.0–36.0)
MCV: 98.7 fL (ref 80.0–100.0)
Platelets: 138 10*3/uL — ABNORMAL LOW (ref 150–400)
RBC: 3.17 MIL/uL — ABNORMAL LOW (ref 3.87–5.11)
RDW: 15 % (ref 11.5–15.5)
WBC: 9 10*3/uL (ref 4.0–10.5)
nRBC: 0.3 % — ABNORMAL HIGH (ref 0.0–0.2)

## 2023-03-19 LAB — CULTURE, RESPIRATORY W GRAM STAIN: Culture: NORMAL

## 2023-03-19 MED ORDER — ALPRAZOLAM 0.25 MG PO TABS
0.2500 mg | ORAL_TABLET | Freq: Once | ORAL | Status: AC
  Administered 2023-03-19: 0.25 mg via ORAL
  Filled 2023-03-19: qty 1

## 2023-03-19 MED ORDER — ASPIRIN 81 MG PO CHEW
81.0000 mg | CHEWABLE_TABLET | Freq: Every day | ORAL | Status: DC
  Administered 2023-03-20 – 2023-03-23 (×4): 81 mg via ORAL
  Filled 2023-03-19 (×4): qty 1

## 2023-03-19 MED ORDER — METOPROLOL SUCCINATE ER 50 MG PO TB24: 50.0000 mg | ORAL_TABLET | Freq: Two times a day (BID) | ORAL | Status: DC

## 2023-03-19 MED ORDER — METOPROLOL TARTRATE 50 MG PO TABS
50.0000 mg | ORAL_TABLET | Freq: Two times a day (BID) | ORAL | Status: DC
  Administered 2023-03-19 – 2023-03-25 (×13): 50 mg via ORAL
  Filled 2023-03-19 (×12): qty 1

## 2023-03-19 MED ORDER — ATORVASTATIN CALCIUM 40 MG PO TABS
40.0000 mg | ORAL_TABLET | Freq: Every day | ORAL | Status: DC
  Administered 2023-03-20 – 2023-03-30 (×10): 40 mg via ORAL
  Filled 2023-03-19 (×10): qty 1

## 2023-03-19 MED ORDER — ACETAMINOPHEN 325 MG PO TABS: 650.0000 mg | ORAL_TABLET | ORAL | Status: DC | PRN

## 2023-03-19 NOTE — Progress Notes (Signed)
  Progress Note    03/19/2023 2:38 PM 3 Days Post-Op  Subjective: No new issues, resting comfortably at the time of my evaluation  Vitals:   03/19/23 0746 03/19/23 1250  BP:  (!) 121/93  Pulse:  98  Resp:    Temp: 97.6 F (36.4 C) 98.2 F (36.8 C)  SpO2:  99%    Physical Exam: Awake alert and oriented Left groin is soft without hematoma Both feet are warm and well-perfused and sensation and motor intact  CBC    Component Value Date/Time   WBC 9.0 03/19/2023 0410   RBC 3.17 (L) 03/19/2023 0410   HGB 10.2 (L) 03/19/2023 0410   HCT 31.3 (L) 03/19/2023 0410   PLT 138 (L) 03/19/2023 0410   MCV 98.7 03/19/2023 0410   MCH 32.2 03/19/2023 0410   MCHC 32.6 03/19/2023 0410   RDW 15.0 03/19/2023 0410   LYMPHSABS 1.2 03/13/2023 1030   MONOABS 0.5 03/13/2023 1030   EOSABS 0.2 03/13/2023 1030   BASOSABS 0.1 03/13/2023 1030    BMET    Component Value Date/Time   NA 137 03/18/2023 0209   K 4.3 03/18/2023 0209   CL 105 03/18/2023 0209   CO2 22 03/18/2023 0209   GLUCOSE 111 (H) 03/18/2023 0209   BUN 30 (H) 03/18/2023 0209   CREATININE 0.96 03/18/2023 0209   CALCIUM 8.7 (L) 03/18/2023 0209   GFRNONAA 58 (L) 03/18/2023 0209   GFRAA >60 03/11/2019 0535    INR    Component Value Date/Time   INR 1.2 03/13/2023 1030     Intake/Output Summary (Last 24 hours) at 03/19/2023 1438 Last data filed at 03/19/2023 1250 Gross per 24 hour  Intake 578.69 ml  Output 201 ml  Net 377.69 ml     Assessment/plan:  88 y.o. female is s/p right lower extremity revascularization for acute on chronic ischemia.  She has been transition to DOAC for atrial fibrillation and is also on aspirin.  She will follow-up in our office in 4 to 6 weeks with right lower extremity arterial duplex and ABIs.   Marry Kusch C. Randie Heinz, MD [facility] Office: [phone] Pager: [phone]  03/19/2023 2:38 PM

## 2023-03-19 NOTE — Progress Notes (Signed)
Dr. Carolan Clines requested 4-6 week f/u with APP or Dr. Tenny Craw. Msg sent to office to call pt with appt info.

## 2023-03-19 NOTE — Progress Notes (Signed)
eLink Physician-Brief Progress Note Patient Name: Sarah Wolf DOB: 1935-05-11 MRN: [id number]   Date of Service  03/19/2023  HPI/Events of Note  In afib RVR with HR up to 160s. No associated hypotension  Pt does not have fever/chills. Denies pain Says she feels anxious and restless, wanting to stand up and walk around the room.   eICU Interventions  One time dose of xanax 0.25mg  ordered.  Will follow response.  If persistently tachycardic, would consider initiating cardizem for rate control.         Melburn Treiber M DELA CRUZ 03/19/2023, 1:07 AM

## 2023-03-19 NOTE — Progress Notes (Signed)
RT called to room to assess patient due to audible wheezing. Patient was in afib upon arrival to the room.  Crackles on bilateral auscultation and upper airway wheeze when auscultating the neck; patient recently extubated and transferred to ICU. RN at bedside and will call attending.

## 2023-03-19 NOTE — Progress Notes (Signed)
PROGRESS NOTE    Sarah Wolf  UUV:[id number] DOB: 1935/01/06 DOA: 03/13/2023 PCP: Tally Joe, MD    Brief Narrative:   Sarah Wolf is a 88 y.o. female with past medical history significant for HTN, paroxysmal atrial fibrillation, history of DVT no longer on anticoagulation who presents to [facility] ED on 8/26 with complaint of bilateral lower extremity pain, greatest on right.  Onset morning of admission, pain all the way down her right leg severe nature.  Review of records note patient with left lower extremity DVT 2020 as well as echo noting A-fib at that time.  She was on Xarelto temporarily after being found to have a LLE DVT following a fall with femur fracture 03/08/2019.  At baseline lives alone, ambulatory with a cane.  Patient denied chest pain, no palpitations, no shortness of breath, no cough, no nausea/vomiting/diarrhea, no lower extremity edema.  And route with EMS patient was noted to be in A-fib with RVR.  In the emergency department patient was noted to be afebrile with pulse elevated up to 145 and atrial fibrillation, systolic blood pressures noted to be elevated into the 160s., and O2 saturations currently maintained on 1.5 L nasal cannula oxygen.  Labs noted sodium 133, potassium 3.5, chloride 93, TSH 2.94 , BNP 799.9, and high-sensitivity troponins 55->57.  Doppler ultrasounds of the lower extremity did not note any signs of a DVT, but did note occlusion of the proximal to mid SFA.  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  As a surgery have been formally consulted.  Patient had been started on heparin drip and given metoprolol IV intermittently.  Cardiology and vascular surgery were consulted.  TRH  consulted for admission for further evaluation and management of A-fib with RVR and acute occlusion of right SFA and left popliteal artery.  Significant Hospital events: 8/26 admitted to [facility] 8/28 thrombolysis of RLE with SFA stent, worsening encephalopathy and possible aspiration event leading to worsening hypoxia; pccm consulted and required intubation; Tx to ICU.  8/30: Extubated 9/1 transferred back to hospitalist service; pending SNF placement  Assessment & Plan:   Critical limb ischemia right lower extremity likely secondary to cardioembolic event Pain of pain to lower legs, acutely.  CT angiogram notable for acute occlusion right SFA and left popliteal artery.  Vascular surgery was consulted and patient underwent right lower extremity angiogram/aortogram with peripheral thrombectomy with SFA stent.   -- Vascular surgery following, appreciate assistance -- Aspirin 81 mg p.o. daily -- Eliquis 5 mg p.o. twice daily --Atorvastatin 40 mg p.o. daily -- Follow-up 4-6 weeks vascular surgery with RLE duplex/ABIs  Acute respiratory failure with hypoxia Suspect aspiration event Following aortogram with thrombectomy, patient has suspected aspiration event and developed acute respiratory failure with hypoxia.  Patient required intubation and transferred to the intensive care unit.  Patient was successfully extubated on 03/17/2023. -- Tracheal aspirate: Normal respiratory flora -- MRSA PCR negative -- Ceftriaxone 2 g IV every 24 hours -- Incentive spirometry -- Continue supplemental oxygen, maintain SpO2 greater than 90%  Acute encephalopathy: Resolved Etiology likely secondary to respiratory failure with hypoxia/hypercarbia after aspiration event as above.  CT head negative.  EEG with no seizure or epileptiform discharges noted.  Ammonia level within normal limits. -- Supportive care  Paroxysmal atrial fibrillation with RVR Previously noted to be in A-fib in 2020, but not on anticoagulation.   Given new embolic event, now will likely need to be on lifelong anticoagulation.  Cardiology avoiding cardioversion in the setting of possible RAA thrombus. -- Cardiology following, appreciate assistance -- Metoprolol tartrate 25 mg p.o. q6h -- Eliquis 5 mg p.o. twice daily -- Continue to monitor on telemetry  Systolic congestive heart failure TTE with LVEF 40-45%, endocardial border not optimally defined to evaluate for regional wall motion abnormalities. -- Cardiology following -- Metoprolol tartrate 25 mg p.o. every 6 hours  Hx DVT -- Eliquis 5 mg p.o. twice daily  Hypophosphatemia Repleted. -- Repeat electrolytes in a.m.  Type 2 diabetes mellitus Hemoglobin A1c 6.6.  Diet controlled.  Urinary retention Required Foley catheter, now resolved. -- Monitor urine output  Lesion of mediastinum Noted incidental finding on CT chest of nodule anterior mediastinum -- Outpatient follow-up with CT surgery  Weakness/debility/deconditioning: PT/OT recommending SNF placement. --TOC consulted   DVT prophylaxis: Heparin drip apixaban (ELIQUIS) tablet 5 mg    Code Status: Do not attempt resuscitation (DNR) PRE-ARREST INTERVENTIONS DESIRED Family Communication: Updated family members present at bedside this morning  Disposition Plan:  Level of care: Progressive Status is: Inpatient Remains inpatient appropriate because: Pending aortogram, heparin/diltiazem drip    Consultants:  Cardiology Vascular surgery  Procedures:  Angiogram/aortogram with thrombectomy and SFA stent, Dr. Sherral Hammers; vascular surgery 8/28 Intubation 8/28 EEG 8/30 Extubation 8/30  Antimicrobials:  None   Subjective: Patient seen examined bedside, resting comfortably.  Sleeping but arousable.  No specific complaints this morning.  Denies pain.  Overnight recurrent A-fib with RVR, given Xanax by PCCM coverage for "anxiety".   No questions or concerns this morning. Denies headache, no dizziness, no chest pain,  no palpitations, no shortness of breath, no abdominal pain, no fever/chills/night sweats, no nausea/vomiting/diarrhea, no focal weakness, no fatigue, no paresthesias.  No acute events overnight per nursing staff.  Objective: Vitals:   03/19/23 0018 03/19/23 0304 03/19/23 0500 03/19/23 0746  BP:  (!) 146/119    Pulse: 80 (!) 106    Resp: 20 19    Temp: 98.2 F (36.8 C) 98.4 F (36.9 C)  97.6 F (36.4 C)  TempSrc: Oral Oral  Oral  SpO2: 98%     Weight:   54.2 kg   Height:        Intake/Output Summary (Last 24 hours) at 03/19/2023 1021 Last data filed at 03/18/2023 2347 Gross per 24 hour  Intake 698.69 ml  Output 201 ml  Net 497.69 ml   Filed Weights   03/17/23 1100 03/18/23 0500 03/19/23 0500  Weight: 58.3 kg 52.9 kg 54.2 kg    Examination:  Physical Exam: GEN: NAD, alert and oriented x 3, wd/wn HEENT: NCAT, PERRL, EOMI, sclera clear, MMM PULM: CTAB w/o wheezes/crackles, normal respiratory effort, on 2 L nasal cannula with SpO2 98% at rest CV: irregularly irregular rhythm, normal rate, w/o M/G/R GI: abd soft, NTND, NABS, no R/G/M MSK: no peripheral edema, moves all extremities independently, noted dressing to right groin in place, clean/dry/intact NEURO: CN II-XII intact, no focal deficits, sensation to light touch intact PSYCH: normal mood/affect Integumentary: dry/intact, no rashes or wounds    Data Reviewed: I have personally reviewed following labs and imaging studies  CBC: Recent Labs  Lab 03/13/23 1030 03/14/23 1008 03/16/23 0224 03/16/23 0809 03/17/23 0244 03/18/23 0209 03/19/23 0410  WBC 7.0   < > 9.6 9.3 9.4 10.9* 9.0  NEUTROABS  5.1  --   --   --   --   --   --   HGB 14.6   < > 13.2 11.3* 11.3* 10.8* 10.2*  HCT 45.1   < > 41.5 35.2* 35.4* 33.2* 31.3*  MCV 98.5   < > 100.0 100.0 98.1 97.6 98.7  PLT 181   < > 135* 118* 134* 134* 138*   < > = values in this interval not displayed.   Basic Metabolic Panel: Recent Labs  Lab 03/13/23 1559  03/14/23 1008 03/15/23 1027 03/15/23 2344 03/16/23 0809 03/16/23 1055 03/17/23 0244 03/18/23 0209  NA  --    < > 135 136 138 136 139 137  K  --    < > 3.2* 3.7 3.8 3.5 3.6 4.3  CL  --    < > 98  --  100 101 106 105  CO2  --    < > 28  --  21* 22 22 22   GLUCOSE  --    < > 102*  --  130* 118* 143* 111*  BUN  --    < > 16  --  20 21 24* 30*  CREATININE  --    < > 0.74  --  0.87 0.99 0.98 0.96  CALCIUM  --    < > 8.4*  --  8.4* 8.1* 8.5* 8.7*  MG 2.2  --   --   --  1.7 1.7 2.4 2.3  PHOS  --   --   --   --   --  2.4* 2.4* 2.1*   < > = values in this interval not displayed.   GFR: Estimated Creatinine Clearance: 33.3 mL/min (by C-G formula based on SCr of 0.96 mg/dL). Liver Function Tests: Recent Labs  Lab 03/13/23 1030 03/15/23 1027 03/16/23 1055 03/18/23 0209  AST 28 27 24  32  ALT 19 22 19 24   ALKPHOS 60 44 37* 39  BILITOT 1.3* 1.4* 2.4* 1.2  PROT 7.0 5.3* 4.9* 5.4*  ALBUMIN 3.6 2.7* 2.3* 2.5*   No results for input(s): "LIPASE", "AMYLASE" in the last 168 hours. Recent Labs  Lab 03/16/23 1055 03/17/23 1637  AMMONIA 42* 25   Coagulation Profile: Recent Labs  Lab 03/13/23 1030  INR 1.2   Cardiac Enzymes: No results for input(s): "CKTOTAL", "CKMB", "CKMBINDEX", "TROPONINI" in the last 168 hours. BNP (last 3 results) No results for input(s): "PROBNP" in the last 8760 hours. HbA1C: No results for input(s): "HGBA1C" in the last 72 hours.  CBG: Recent Labs  Lab 03/17/23 2046 03/18/23 0002 03/18/23 0327 03/18/23 0809 03/18/23 1227  GLUCAP 109* 128* 108* 109* 116*   Lipid Profile: No results for input(s): "CHOL", "HDL", "LDLCALC", "TRIG", "CHOLHDL", "LDLDIRECT" in the last 72 hours.  Thyroid Function Tests: No results for input(s): "TSH", "T4TOTAL", "FREET4", "T3FREE", "THYROIDAB" in the last 72 hours.  Anemia Panel: No results for input(s): "VITAMINB12", "FOLATE", "FERRITIN", "TIBC", "IRON", "RETICCTPCT" in the last 72 hours. Sepsis Labs: No results for  input(s): "PROCALCITON", "LATICACIDVEN" in the last 168 hours.  Recent Results (from the past 240 hour(s))  SARS Coronavirus 2 by RT PCR (hospital order, performed in [facility] hospital lab) *cepheid single result test* Anterior Nasal Swab     Status: None   Collection Time: 03/14/23  5:40 PM   Specimen: Anterior Nasal Swab  Result Value Ref Range Status   SARS Coronavirus 2 by RT PCR NEGATIVE NEGATIVE Final    Comment: Performed at [facility],  [address], [place], [place] [address]  MRSA Next Gen by PCR, Nasal     Status: None   Collection Time: 03/16/23  5:09 AM   Specimen: Nasal Mucosa; Nasal Swab  Result Value Ref Range Status   MRSA by PCR Next Gen NOT DETECTED NOT DETECTED Final    Comment: (NOTE) The GeneXpert MRSA Assay (FDA approved for NASAL specimens only), is one component of a comprehensive MRSA colonization surveillance program. It is not intended to diagnose MRSA infection nor to guide or monitor treatment for MRSA infections. Test performance is not FDA approved in patients less than 69 years old. Performed at [facility], [address] [place], [place] [address]   Culture, Respiratory w Gram Stain     Status: None (Preliminary result)   Collection Time: 03/16/23  5:09 AM   Specimen: Tracheal Aspirate; Respiratory  Result Value Ref Range Status   Specimen Description TRACHEAL ASPIRATE  Final   Special Requests NONE  Final   Gram Stain   Final    MODERATE WBC PRESENT,BOTH PMN AND MONONUCLEAR FEW GRAM POSITIVE COCCI IN PAIRS AND CHAINS    Culture   Final    Normal respiratory flora-no Staph aureus or Pseudomonas seen Performed at [facility], [address] [place], [place] [address]    Report Status PENDING  Incomplete         Radiology Studies: EEG adult  Result Date: 04/13/23 Charlsie Quest, MD     04/13/23 11:02 PM Patient Name: Merlee Kellman MRN: [id number] Epilepsy Attending: Charlsie Quest Referring  Physician/Provider: Duayne Cal, NP Date: 04/13/23 Duration: 22.36 mins Patient history: 88yo F with ams getting eeg to evaluate for seizure Level of alertness: Awake AEDs during EEG study: None Technical aspects: This EEG study was done with scalp electrodes positioned according to the 10-20 International system of electrode placement. Electrical activity was reviewed with band pass filter of 1-70Hz , sensitivity of 7 uV/mm, display speed of 103mm/sec with a 60Hz  notched filter applied as appropriate. EEG data were recorded continuously and digitally stored.  Video monitoring was available and reviewed as appropriate. Description: EEG showed continuous generalized predominantly 5 to 6 Hz theta admixed with 2-3Hz  delta slowing. Hyperventilation and photic stimulation were not performed.   ABNORMALITY - Continuous slow, generalized IMPRESSION: This study is suggestive of moderate diffuse encephalopathy. No seizures or epileptiform discharges were seen throughout the recording. Priyanka Annabelle Harman        Scheduled Meds:  apixaban  5 mg Oral BID   [START ON 03/20/2023] aspirin  81 mg Oral Daily   [START ON 03/20/2023] atorvastatin  40 mg Oral Daily   Chlorhexidine Gluconate Cloth  6 each Topical Daily   feeding supplement  237 mL Oral BID BM   metoprolol tartrate  25 mg Oral Q6H   pneumococcal 20-valent conjugate vaccine  0.5 mL Intramuscular Tomorrow-1000   sodium chloride flush  3 mL Intravenous Q12H   sodium chloride flush  3 mL Intravenous Q12H   sodium chloride flush  3 mL Intravenous Q12H   Continuous Infusions:  sodium chloride     sodium chloride 10 mL/hr at 03/18/23 1100   sodium chloride 10 mL/hr at 03/16/23 1400   sodium chloride     cefTRIAXone (ROCEPHIN)  IV 2 g (03/19/23 0904)     LOS: 6 days    Time spent: 52 minutes spent on chart review, discussion with nursing staff, consultants, updating family and interview/physical exam; more  than 50% of that time was spent in counseling  and/or coordination of care.    Alvira Philips [place], DO [facility] Available via Epic secure chat 7am-7pm After these hours, please refer to coverage provider listed on [email or web address] 03/19/2023, 10:21 AM

## 2023-03-19 NOTE — Plan of Care (Signed)
  Problem: Health Behavior/Discharge Planning: Goal: Ability to safely manage health-related needs after discharge will improve Outcome: Not Progressing   

## 2023-03-19 NOTE — Progress Notes (Signed)
Rounding Note    Patient Name: Sarah Wolf Date of Encounter: 03/19/2023  [facility] Cardiologist: Dietrich Pates, MD   Subjective   She feels well this AM .  Inpatient Medications    Scheduled Meds:  apixaban  5 mg Oral BID   [START ON 03/20/2023] aspirin  81 mg Oral Daily   [START ON 03/20/2023] atorvastatin  40 mg Oral Daily   Chlorhexidine Gluconate Cloth  6 each Topical Daily   feeding supplement  237 mL Oral BID BM   metoprolol tartrate  25 mg Oral Q6H   pneumococcal 20-valent conjugate vaccine  0.5 mL Intramuscular Tomorrow-1000   sodium chloride flush  3 mL Intravenous Q12H   sodium chloride flush  3 mL Intravenous Q12H   sodium chloride flush  3 mL Intravenous Q12H   Continuous Infusions:  sodium chloride     sodium chloride 10 mL/hr at 03/18/23 1100   sodium chloride 10 mL/hr at 03/16/23 1400   sodium chloride     cefTRIAXone (ROCEPHIN)  IV 2 g (03/19/23 0904)   PRN Meds: sodium chloride, sodium chloride, acetaminophen **OR** acetaminophen, acetaminophen, albuterol, guaiFENesin-dextromethorphan, hydrALAZINE, hydrALAZINE, HYDROcodone-acetaminophen, labetalol, labetalol, ondansetron (ZOFRAN) IV, ondansetron (ZOFRAN) IV, mouth rinse, phenol, sodium chloride flush, sodium chloride flush   Vital Signs    Vitals:   03/19/23 0018 03/19/23 0304 03/19/23 0500 03/19/23 0746  BP:  (!) 146/119    Pulse: 80 (!) 106    Resp: 20 19    Temp: 98.2 F (36.8 C) 98.4 F (36.9 C)  97.6 F (36.4 C)  TempSrc: Oral Oral  Oral  SpO2: 98%     Weight:   54.2 kg   Height:        Intake/Output Summary (Last 24 hours) at 03/19/2023 1040 Last data filed at 03/18/2023 2347 Gross per 24 hour  Intake 698.69 ml  Output 201 ml  Net 497.69 ml      03/19/2023    5:00 AM 03/18/2023    5:00 AM 03/17/2023   11:00 AM  Last 3 Weights  Weight (lbs) 119 lb 7.8 oz 116 lb 10 oz 128 lb 8.5 oz  Weight (kg) 54.2 kg 52.9 kg 58.3 kg      Telemetry    Atrial fibrillation rates low 100s  mostly  . - Personally Reviewed  ECG    No new ECG tracing . - Personally Reviewed  Physical Exam   GEN: on O2 Pulm: no wob, good airmovement in the bases, no rhonchi Neck: No JVD. Cardiac: Irregularly irregular rhythm  No murmurs Ext   No LE edema   Skin: warm and well perfused.  Vasc: pulses challenging to palpate DP, but skin is warm and pink   Labs    High Sensitivity Troponin:   Recent Labs  Lab 03/13/23 1030 03/13/23 1235  TROPONINIHS 55* 57*     Chemistry Recent Labs  Lab 03/15/23 1027 03/15/23 2344 03/16/23 1055 03/17/23 0244 03/18/23 0209  NA 135   < > 136 139 137  K 3.2*   < > 3.5 3.6 4.3  CL 98   < > 101 106 105  CO2 28   < > 22 22 22   GLUCOSE 102*   < > 118* 143* 111*  BUN 16   < > 21 24* 30*  CREATININE 0.74   < > 0.99 0.98 0.96  CALCIUM 8.4*   < > 8.1* 8.5* 8.7*  MG  --    < > 1.7 2.4 2.3  PROT 5.3*  --  4.9*  --  5.4*  ALBUMIN 2.7*  --  2.3*  --  2.5*  AST 27  --  24  --  32  ALT 22  --  19  --  24  ALKPHOS 44  --  37*  --  39  BILITOT 1.4*  --  2.4*  --  1.2  GFRNONAA >60   < > 56* 56* 58*  ANIONGAP 9   < > 13 11 10    < > = values in this interval not displayed.    Lipids  Recent Labs  Lab 03/13/23 1559  CHOL 153  TRIG 40  HDL 47  LDLCALC 98  CHOLHDL 3.3    Hematology Recent Labs  Lab 03/17/23 0244 03/18/23 0209 03/19/23 0410  WBC 9.4 10.9* 9.0  RBC 3.61* 3.40* 3.17*  HGB 11.3* 10.8* 10.2*  HCT 35.4* 33.2* 31.3*  MCV 98.1 97.6 98.7  MCH 31.3 31.8 32.2  MCHC 31.9 32.5 32.6  RDW 14.6 15.1 15.0  PLT 134* 134* 138*   Thyroid  Recent Labs  Lab 03/13/23 1030  TSH 2.940    BNP Recent Labs  Lab 03/13/23 1030  BNP 799.9*    DDimer No results for input(s): "DDIMER" in the last 168 hours.   Radiology    EEG adult  Result Date: 03/17/2023 Charlsie Quest, MD     03/17/2023 11:02 PM Patient Name: Sarah Wolf MRN: [id number] Epilepsy Attending: Charlsie Quest Referring Physician/Provider: Duayne Cal, NP Date:  03/17/2023 Duration: 22.36 mins Patient history: 88yo F with ams getting eeg to evaluate for seizure Level of alertness: Awake AEDs during EEG study: None Technical aspects: This EEG study was done with scalp electrodes positioned according to the 10-20 International system of electrode placement. Electrical activity was reviewed with band pass filter of 1-70Hz , sensitivity of 7 uV/mm, display speed of 58mm/sec with a 60Hz  notched filter applied as appropriate. EEG data were recorded continuously and digitally stored.  Video monitoring was available and reviewed as appropriate. Description: EEG showed continuous generalized predominantly 5 to 6 Hz theta admixed with 2-3Hz  delta slowing. Hyperventilation and photic stimulation were not performed.   ABNORMALITY - Continuous slow, generalized IMPRESSION: This study is suggestive of moderate diffuse encephalopathy. No seizures or epileptiform discharges were seen throughout the recording. Charlsie Quest    Cardiac Studies   Echocardiogram 03/14/2023: Impressions: 1. Left ventricular ejection fraction, by estimation, is 40 to 45%. The  left ventricle has mildly decreased function. Left ventricular endocardial  border not optimally defined to evaluate regional wall motion. Left  ventricular diastolic function could  not be evaluated.   2. Right ventricular systolic function was not well visualized. The right  ventricular size is dilated.   3. A small pericardial effusion is present. The pericardial effusion is  surrounding the apex.   4. The mitral valve is grossly normal. Trivial mitral valve  regurgitation. No evidence of mitral stenosis.   5. The aortic valve was not well visualized. Aortic valve regurgitation  is not visualized.   6. Cannot exclude a small PFO.   7. Technically difficult study.   Comparison(s): Unable to view prior study. LVEF decreased from prior  reporting.  _______________  Lower Extremity Arterial Ultrasound  03/14/2023: Summary:  Right: Total occlusion noted in the superficial femoral artery.  Reconstitution of the distal SFA via collateral. Dampened flow noted in  the posterior tibial and anterior tibial ateries. Unable to detect flow in  the peroneal artery.   Left: Total occlusion noted in the popliteal artery. Reconstitution of the  proximal PTA via collateral. Monophasic flow noted throughout the anterior  tibial and peroneal arteries.   ABI Summary: Right: Resting right ankle-brachial index indicates critical limb  ischemia.   Left: Resting left ankle-brachial index indicates moderate left lower  extremity arterial disease. The left toe-brachial index is abnormal.    Peripheral Thrombectomy 03/16/2023 The SFA is patent and there were two lesions in the SFA. One was a focal high-grade stenosis over 90% in the proximal SFA with some associated acute thrombus that was treated with a 6 mm x 5 cm Viabahn. Distally there was a second 50% stenosis in the SFA treated with a 6 mm x 40 mm drug-coated Eluvia. She now had inline flow into the foot through the posterior tibial with filling of the anterior tibial although this is occluded proximally.    Patient Profile     88 y.o. female with a history of paroxysmal atrial fibrillation noted on Echo in 2020, hypertension, prior DVT in 2020 after femur fracture (completed course of anticoagulation), and multiple falls who was admitted on 03/13/2023 for critical limb ischemia after presenting with leg pain. CTA showed complete occlusion of the left popliteal artery and right SFA (possible embolic phenomenon). Also noted to be in rapid atrial fibrillation and acute CHF for which Cardiology was consulted.  Assessment & Plan    Atrial Fibrillation  -->rates in  ok control Paroxysmal atrial fibrillation was noted on an Echo in 2020 but she was never seen by Cardiology.  Presented at this hospitalization with Afib with RVR   Initially on IV amiodarone    Stopped due to concern for possible cardioversion on amio (CTA of chest-- LA appendage did not fill therefore  could not rule out thrombus)  Pt was switched to IV diltiazem.Now off of this      Rates this am off of meds were good   They have since crept up to 100s / 110s    SBP 100s/ - s/p IV digoxin 0.125  - consolidated metop 25 mg q6H, stopped coreg; to metop 50 mg BID tartrate Continue eliquis   Acute on Chronic HFrEF Echo showed LVEF of 40-45% but endocardial border was not optimally defined to evaluate for regional wall motion abnormalities.  I have reviewed  LVEF 45%  Basal inferior wall appears hypokinetic RV function  normal to mildly decreased    Volume status overall not bad    Pulmonary   Pt appears to have aspirated secretions after vascular procedure     Developed resp distress   Intubated    She is now  extubated  Appears comfortable  CCM following    PAD CTA showed completed occlusion of left popliteal artery and right SFA as well as CTO of right internal iliac artery. Lower extremity arterial ultrasounds showed also showed total occlusion of left popliteal artery and right SFA. ABIs inidicative of critical limb ischemia on the right and moderate disease on the left. Pt underwent cath with thrombolysis   On NOAC  and asa  HL   Pt is on atorvastatin 40 mg     She is asymptomatic from her rhythm and doing well. Consolidate her BB frequency. Otherwise, she can DC from a cardiology perspective. Will work on [employer]  For questions or updates, please contact [facility] Please consult [email or web address] for contact info under        Signed, Carolan Clines  E, MD  03/19/2023, 10:40 AM

## 2023-03-20 DIAGNOSIS — I70221 Atherosclerosis of native arteries of extremities with rest pain, right leg: Secondary | ICD-10-CM | POA: Diagnosis not present

## 2023-03-20 LAB — BASIC METABOLIC PANEL
Anion gap: 11 (ref 5–15)
BUN: 18 mg/dL (ref 8–23)
CO2: 24 mmol/L (ref 22–32)
Calcium: 9 mg/dL (ref 8.9–10.3)
Chloride: 99 mmol/L (ref 98–111)
Creatinine, Ser: 0.69 mg/dL (ref 0.44–1.00)
GFR, Estimated: >60 mL/min (ref >60)
Glucose, Bld: 126 mg/dL — ABNORMAL HIGH (ref 70–99)
Potassium: 5 mmol/L (ref 3.5–5.1)
Sodium: 134 mmol/L — ABNORMAL LOW (ref 135–145)

## 2023-03-20 LAB — URINALYSIS, COMPLETE (UACMP) WITH MICROSCOPIC
Bilirubin Urine: NEGATIVE
Glucose, UA: NEGATIVE mg/dL
Ketones, ur: NEGATIVE mg/dL
Nitrite: NEGATIVE
Protein, ur: 30 mg/dL — AB
Specific Gravity, Urine: 1.019 (ref 1.005–1.030)
pH: 5 (ref 5.0–8.0)

## 2023-03-20 LAB — CBC
HCT: 36.8 % (ref 36.0–46.0)
Hemoglobin: 12 g/dL (ref 12.0–15.0)
MCH: 31.8 pg (ref 26.0–34.0)
MCHC: 32.6 g/dL (ref 30.0–36.0)
MCV: 97.6 fL (ref 80.0–100.0)
Platelets: 218 10*3/uL (ref 150–400)
RBC: 3.77 MIL/uL — ABNORMAL LOW (ref 3.87–5.11)
RDW: 15.1 % (ref 11.5–15.5)
WBC: 11.1 10*3/uL — ABNORMAL HIGH (ref 4.0–10.5)
nRBC: 0.4 % — ABNORMAL HIGH (ref 0.0–0.2)

## 2023-03-20 LAB — PHOSPHORUS: Phosphorus: 2.8 mg/dL (ref 2.5–4.6)

## 2023-03-20 LAB — MAGNESIUM: Magnesium: 2.1 mg/dL (ref 1.7–2.4)

## 2023-03-20 MED ORDER — SODIUM CHLORIDE 0.9 % IV SOLN: INTRAVENOUS | Status: DC

## 2023-03-20 NOTE — Progress Notes (Signed)
Patient has not voided all night for this RN. Patient stated that, "she didn't feel like she needed to urinate."   Patient was gotten up to bedside with 2 assist. Patient able to urinate 50 ml. UA sent at that time.  Will notify MD about the above. Will continue to monitor

## 2023-03-20 NOTE — TOC Initial Note (Addendum)
Transition of Care [facility]) - Initial/Assessment Note    Patient Details  Name: Sarah Wolf MRN: [id number] Date of Birth: May 16, 1935  Transition of Care [facility]) CM/SW Contact:    Delilah Shan, LCSWA Phone Number: 03/20/2023, 2:42 PM  Clinical Narrative:                  CSW received consult for possible SNF placement at time of discharge. CSW spoke with patient regarding PT recommendation of SNF placement at time of discharge.  Patient reports PTA she comes from home alone.Patient expressed understanding of PT recommendation and is agreeable to SNF placement at time of discharge. Patient gave CSW permission to fax out initial referral near the [facility] area for possible SNF placement.CSW discussed insurance authorization process. No further questions reported at this time. Not in [employer] portal. CSW will need to call patients insurance tomorrow to see if patient is managed by [employer].CSW to continue to follow and assist with discharge planning needs.   Expected Discharge Plan: Skilled Nursing Facility Barriers to Discharge: Continued Medical Work up   Patient Goals and CMS Choice Patient states their goals for this hospitalization and ongoing recovery are:: SNF   Choice offered to / list presented to : Patient      Expected Discharge Plan and Services In-house Referral: Clinical Social Work Discharge Planning Services: CM Consult   Living arrangements for the past 2 months: Single Family Home                   DME Agency: NA                  Prior Living Arrangements/Services Living arrangements for the past 2 months: Single Family Home Lives with:: Self Patient language and need for interpreter reviewed:: Yes Do you feel safe going back to the place where you live?: No   SNF  Need for Family Participation in Patient Care: Yes (Comment) Care giver support system in place?: Yes (comment)   Criminal Activity/Legal Involvement Pertinent to Current Situation/Hospitalization: No  - Comment as needed  Activities of Daily Living Home Assistive Devices/Equipment: Cane (specify quad or straight), Walker (specify type) ADL Screening (condition at time of admission) Patient's cognitive ability adequate to safely complete daily activities?: Yes Is the patient deaf or have difficulty hearing?: No Does the patient have difficulty seeing, even when wearing glasses/contacts?: No Does the patient have difficulty concentrating, remembering, or making decisions?: Yes Patient able to express need for assistance with ADLs?: Yes Does the patient have difficulty dressing or bathing?: Yes Independently performs ADLs?: No Communication: Independent Dressing (OT): Needs assistance Is this a change from baseline?: Change from baseline, expected to last <3days Grooming: Independent Feeding: Independent Bathing: Independent Toileting: Needs assistance Is this a change from baseline?: Change from baseline, expected to last <3 days In/Out Bed: Needs assistance Is this a change from baseline?: Change from baseline, expected to last <3 days Walks in Home: Independent with device (comment) (cane) Does the patient have difficulty walking or climbing stairs?: No Weakness of Legs: Right Weakness of Arms/Hands: None  Permission Sought/Granted Permission sought to share information with : Case Manager, Magazine features editor, Family Supports Permission granted to share information with : Yes, Verbal Permission Granted  Share Information with NAME: Bud  Permission granted to share info w AGENCY: SNF  Permission granted to share info w Relationship: nephew  Permission granted to share info w Contact Information: Bud [phone]  Emotional Assessment Appearance:: Appears stated age Attitude/Demeanor/Rapport: Gracious  Affect (typically observed): Calm Orientation: : Oriented to Situation, Oriented to Self Alcohol / Substance Use: Not Applicable Psych Involvement: No  (comment)  Admission diagnosis:  Popliteal artery occlusion, 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] Atrial fibrillation with RVR (HCC) [I48.91] Superficial femoral artery occlusion (HCC) [I70.209] Atrial fibrillation, unspecified type [facility]) [I48.91] Patient Active Problem List   Diagnosis Date Noted   Aspiration into airway 03/17/2023   Acute respiratory failure with hypoxia (HCC) 03/15/2023   Goals of care, counseling/discussion 03/15/2023   Atrial fibrillation with RVR (HCC) 03/13/2023   Critical limb ischemia of right lower extremity (HCC) 03/13/2023   Thrombus of right atrial appendage 03/13/2023   Myocardial injury due to Afib 03/13/2023   Chronic HFpEF 03/13/2023   History of DVT (deep vein thrombosis) 03/13/2023   Lesion of mediastinum 03/13/2023   Hyponatremia 03/13/2023   Pain due to onychomycosis of toenails of both feet 01/06/2023   Hip fracture (HCC) 03/08/2019   Closed comminuted intra-articular fracture of distal femur, right, initial encounter (HCC)    Closed displaced supracondylar fracture with intracondylar extension of lower end of left femur (HCC)    PCP:  Tally Joe, MD Pharmacy:   [facility] [id number]), [place] - [address] [place] ([place]) [place] [address] Phone: [phone] Fax: [phone]  [facility] [address] [place] [address] Phone: [phone] Fax: [phone]     Social Determinants of Health (SDOH) Social History: SDOH Screenings   Food Insecurity: No Food Insecurity (03/13/2023)  Housing: Medium Risk (03/13/2023)  Transportation Needs: No Transportation Needs (03/13/2023)  Utilities: Not At Risk (03/13/2023)  Tobacco Use: Low Risk  (03/14/2023)   SDOH Interventions:     Readmission Risk Interventions     No data to display

## 2023-03-20 NOTE — Significant Event (Signed)
Per RN: no UOP all night, finally voided 50 cc, only 37cc on bladder scan.  Pt doesn't feel like she needs to urinate.   BMP still pending this AM.   Will hold off on I+O cath for now, but if still no voiding later this AM may want to consider this.  UA sent on that 50cc UOP.

## 2023-03-20 NOTE — Progress Notes (Signed)
Occupational Therapy Treatment Patient Details Name: Sarah Wolf MRN: [id number] DOB: Aug 13, 1934 Today's Date: 03/20/2023   History of present illness Pt is 88 year old who presents to [facility] on 8/26 with complaint of bilateral lower extremity pain, greatest on right. Pt with critical limb ischemia RLE.  8/28 patient underwent thrombolysis of RLE. Worsening encephalopathy post procedure.Aspiration event leading to hypoxia and worsening AMS.  Intubated 8/28- 8/30 PMH - HTN, paroxysmal atrial fibrillation, history of DVT no longer on anticoagulation   OT comments  Pt with good progress toward established OT goals. Pt with continued poor mobility of RUE due to humeral fx, but with good ability to manage with use of compensatory techniques. Focus session on activity tolerance, cognition, and transfers. Pt needing min A and cues during all attempts for transfers and serial STS. Grading cues throughout session as able to optimize pt memory and safety "put hands here" vs "where do your hands go". Pt able to perform anterior and posterior pericare with CGA and doff socks seated with CGA as well. Will continue to follow. Patient will benefit from continued inpatient follow up therapy, <3 hours/day       If plan is discharge home, recommend the following:  A lot of help with bathing/dressing/bathroom;Assist for transportation;Assistance with cooking/housework;A lot of help with walking and/or transfers   Equipment Recommendations  None recommended by OT    Recommendations for Other Services      Precautions / Restrictions Precautions Precautions: Fall Precaution Comments: Active humeral fx to R arm Restrictions Weight Bearing Restrictions: No       Mobility Bed Mobility Overal bed mobility: Needs Assistance Bed Mobility: Supine to Sit     Supine to sit: Contact guard     General bed mobility comments: for safety    Transfers Overall transfer level: Needs assistance Equipment used:  Rolling walker (2 wheels) Transfers: Sit to/from Stand, Bed to chair/wheelchair/BSC Sit to Stand: Min assist     Step pivot transfers: Min assist, Contact guard assist     General transfer comment: Min A for cues for hand placemetn and light assist to rise; intermittent min A for balance during steps     Balance Overall balance assessment: Needs assistance Sitting-balance support: No upper extremity supported, Feet supported Sitting balance-Leahy Scale: Fair     Standing balance support: Single extremity supported Standing balance-Leahy Scale: Poor                             ADL either performed or assessed with clinical judgement   ADL Overall ADL's : Needs assistance/impaired Eating/Feeding: Sitting;Set up   Grooming: Wash/dry face;Contact guard assist;Standing               Lower Body Dressing: Contact guard assist;Sitting/lateral leans Lower Body Dressing Details (indicate cue type and reason): to doff socks at end of ssession Toilet Transfer: Minimal assistance;Contact guard assist;Rolling walker (2 wheels);Ambulation Toilet Transfer Details (indicate cue type and reason): Min A for initial rise, then CGA Toileting- Clothing Manipulation and Hygiene: Contact guard assist;Sit to/from stand Toileting - Clothing Manipulation Details (indicate cue type and reason): for anterior and posterior pericare. Soiled on arirval without awareness,     Functional mobility during ADLs: Contact guard assist;Rolling walker (2 wheels);Minimal assistance      Extremity/Trunk Assessment Upper Extremity Assessment Upper Extremity Assessment: Generalized weakness;Right hand dominant RUE Deficits / Details: Humeral fx being worked up for possible sx by PCP RUE Coordination: decreased  gross motor   Lower Extremity Assessment Lower Extremity Assessment: Defer to PT evaluation        Vision   Vision Assessment?: No apparent visual deficits   Perception  Perception Perception: Not tested   Praxis Praxis Praxis: Not tested    Cognition Arousal: Alert Behavior During Therapy: [facility] for tasks assessed/performed Overall Cognitive Status: No family/caregiver present to determine baseline cognitive functioning                                 General Comments: Following one step commands with increased time. Over giggly, but very pleasant. Pt needing repeated commands during serial sit to stand for hand placement        Exercises Exercises: Other exercises Other Exercises Other Exercises: serial STS x5 with rest breaks and cues for hand placement on all attempts    Shoulder Instructions       General Comments VSS on 1L supplemental O2 via [place]    Pertinent Vitals/ Pain       Pain Assessment Pain Assessment: Faces Faces Pain Scale: Hurts little more Pain Location: RUE Pain Descriptors / Indicators: Grimacing Pain Intervention(s): Limited activity within patient's tolerance, Monitored during session  Home Living                                          Prior Functioning/Environment              Frequency  Min 1X/week        Progress Toward Goals  OT Goals(current goals can now be found in the care plan section)  Progress towards OT goals: Progressing toward goals  Acute Rehab OT Goals Patient Stated Goal: get better OT Goal Formulation: With patient Time For Goal Achievement: 03/31/23 Potential to Achieve Goals: Fair ADL Goals Pt Will Perform Grooming: with set-up;sitting Pt Will Perform Upper Body Bathing: with set-up;sitting Pt Will Perform Upper Body Dressing: with set-up;sitting Pt Will Transfer to Toilet: with mod assist;stand pivot transfer;bedside commode  Plan      Co-evaluation                 AM-PAC OT "6 Clicks" Daily Activity     Outcome Measure   Help from another person eating meals?: A Little Help from another person taking care of personal grooming?:  A Little Help from another person toileting, which includes using toliet, bedpan, or urinal?: A Little Help from another person bathing (including washing, rinsing, drying)?: A Little Help from another person to put on and taking off regular upper body clothing?: A Lot Help from another person to put on and taking off regular lower body clothing?: A Little 6 Click Score: 17    End of Session Equipment Utilized During Treatment: Gait belt;Rolling walker (2 wheels)  OT Visit Diagnosis: Unsteadiness on feet (R26.81);Muscle weakness (generalized) (M62.81);Pain Pain - Right/Left: Right Pain - part of body: Shoulder   Activity Tolerance Patient tolerated treatment well   Patient Left in chair;with call bell/phone within reach;with chair alarm set;with family/visitor present   Nurse Communication Mobility status        Time: 1610-9604 OT Time Calculation (min): 29 min  Charges: OT General Charges $OT Visit: 1 Visit OT Treatments $Self Care/Home Management : 8-22 mins $Therapeutic Activity: 8-22 mins  Myrla Halsted, OTD, OTR/L [facility] Acute Rehabilitation  Office: [phone]   Myrla Halsted 03/20/2023, 4:22 PM

## 2023-03-20 NOTE — Progress Notes (Signed)
Speech Language Pathology Treatment: Dysphagia  Patient Details Name: Nylla Bubier MRN: [id number] DOB: [birth year] Today's Date: 03/20/2023 Time: 3875-6433 SLP Time Calculation (min) (ACUTE ONLY): 8 min  Assessment / Plan / Recommendation Clinical Impression  Pt seen after am meal, pt is happy with texture, eating well. No signs of aspiration with thin liquids. Vocal quality much improved though still dysphonic. Continue dys 2 diet and thin liquids through d/c. No further SLP f/u needed will sign off.   HPI HPI: Patient is a 88 yo F w/ pertinent PMH PAF, HTN, DVT 2020 no longer on DOAC presents to [facility] on 8/26 w/ critical limb ischemia RLE.     Patient complaining of worsening BLE pain worse on right. Patient noted to be in afib RVR rates 140s. SBP 160s. BNP 799. DVT US no signs of dvt but occlusion of proximal mid SFA. 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 Vasc surgery and cards consulted. Started on heparin drip and given metoprolol. Started on diltiazem drip. TRH admitted to floor.      On 8/28 patient underwent thrombolysis of RLE. That evening patient with worsening encephalopathy. Had aspiration event (unsure if related to PO intake or vomiting or secretions) and became more hypoxic and worsening AMS. intubated 8/28-8/30.      SLP Plan  All goals met      Recommendations for follow up therapy are one component of a multi-disciplinary discharge planning process, led by the attending physician.  Recommendations may be updated based on patient status, additional functional criteria and insurance authorization.    Recommendations  Diet recommendations: Dysphagia 2 (fine chop);Thin liquid Medication Administration: Whole meds with puree Supervision: Patient able to self  feed Compensations: Slow rate;Small sips/bites Postural Changes and/or Swallow Maneuvers: Seated upright 90 degrees                        Dysphagia, unspecified (R13.10)     All goals met     Ronni Osterberg, Riley Nearing  03/20/2023, 10:18 AM

## 2023-03-20 NOTE — Progress Notes (Signed)
PROGRESS NOTE    Sarah Wolf  JYN:[id number] DOB: 1934/11/30 DOA: 03/13/2023 PCP: Tally Joe, MD    Brief Narrative:   Sarah Wolf is a 88 y.o. female with past medical history significant for HTN, paroxysmal atrial fibrillation, history of DVT no longer on anticoagulation who presents to [facility] ED on 8/26 with complaint of bilateral lower extremity pain, greatest on right.  Onset morning of admission, pain all the way down her right leg severe nature.  Review of records note patient with left lower extremity DVT 2020 as well as echo noting A-fib at that time.  She was on Xarelto temporarily after being found to have a LLE DVT following a fall with femur fracture 03/08/2019.  At baseline lives alone, ambulatory with a cane.  Patient denied chest pain, no palpitations, no shortness of breath, no cough, no nausea/vomiting/diarrhea, no lower extremity edema.  And route with EMS patient was noted to be in A-fib with RVR.  In the emergency department patient was noted to be afebrile with pulse elevated up to 145 and atrial fibrillation, systolic blood pressures noted to be elevated into the 160s., and O2 saturations currently maintained on 1.5 L nasal cannula oxygen.  Labs noted sodium 133, potassium 3.5, chloride 93, TSH 2.94 , BNP 799.9, and high-sensitivity troponins 55->57.  Doppler ultrasounds of the lower extremity did not note any signs of a DVT, but did note occlusion of the proximal to mid SFA.  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  As a surgery have been formally consulted.  Patient had been started on heparin drip and given metoprolol IV intermittently.  Cardiology and vascular surgery were consulted.  TRH  consulted for admission for further evaluation and management of A-fib with RVR and acute occlusion of right SFA and left popliteal artery.  Significant Hospital events: 8/26 admitted to [facility] 8/28 thrombolysis of RLE with SFA stent, worsening encephalopathy and possible aspiration event leading to worsening hypoxia; pccm consulted and required intubation; Tx to ICU.  8/30: Extubated 9/1 transferred back to hospitalist service; pending SNF placement  Assessment & Plan:   Critical limb ischemia right lower extremity likely secondary to cardioembolic event Pain of pain to lower legs, acutely.  CT angiogram notable for acute occlusion right SFA and left popliteal artery.  Vascular surgery was consulted and patient underwent right lower extremity angiogram/aortogram with peripheral thrombectomy with SFA stent.   -- Vascular surgery following, appreciate assistance -- Aspirin 81 mg p.o. daily -- Eliquis 5 mg p.o. twice daily -- Atorvastatin 40 mg p.o. daily -- Follow-up 4-6 weeks vascular surgery with RLE duplex/ABIs  Acute respiratory failure with hypoxia Suspect aspiration event Following aortogram with thrombectomy, patient has suspected aspiration event and developed acute respiratory failure with hypoxia.  Patient required intubation and transferred to the intensive care unit.  Patient was successfully extubated on 03/17/2023. -- Tracheal aspirate: Normal respiratory flora -- MRSA PCR negative -- Ceftriaxone 2 g IV every 24 hours -- Incentive spirometry -- Continue supplemental oxygen, maintain SpO2 > 90%  Acute encephalopathy: Resolved Etiology likely secondary to respiratory failure with hypoxia/hypercarbia after aspiration event as above.  CT head negative.  EEG with no seizure or epileptiform discharges noted.  Ammonia level within normal limits. -- Supportive care  Paroxysmal atrial fibrillation with RVR Previously noted to be in A-fib in 2020, but not on anticoagulation.  Given new  embolic event, now will likely need to be on lifelong anticoagulation.  Cardiology avoiding cardioversion in the setting of possible LAA appendage thrombus. -- Cardiology following, appreciate assistance -- Metoprolol tartrate 50 mg p.o. BID -- Eliquis 5 mg p.o. twice daily -- Continue to monitor on telemetry  Systolic congestive heart failure TTE with LVEF 40-45%, endocardial border not optimally defined to evaluate for regional wall motion abnormalities. -- Cardiology following -- Metoprolol tartrate 50 mg p.o. BID  Hx DVT -- Eliquis 5 mg p.o. twice daily  Hypophosphatemia Repleted. -- Repeat electrolytes in a.m.  Type 2 diabetes mellitus Hemoglobin A1c 6.6.  Diet controlled.  Urinary retention Required Foley catheter, now resolved. -- Monitor urine output  Lesion of mediastinum Noted incidental finding on CT chest of nodule anterior mediastinum -- Outpatient follow-up with CT surgery  Weakness/debility/deconditioning: PT/OT recommending SNF placement. --TOC consulted   DVT prophylaxis: Heparin drip apixaban (ELIQUIS) tablet 5 mg    Code Status: Do not attempt resuscitation (DNR) PRE-ARREST INTERVENTIONS DESIRED Family Communication: No family present at bedside this morning  Disposition Plan:  Level of care: Progressive Status is: Inpatient Remains inpatient appropriate because: Pending placement, medically stable for discharge once bed available    Consultants:  Cardiology Vascular surgery  Procedures:  Angiogram/aortogram with thrombectomy and SFA stent, Dr. Sherral Hammers; vascular surgery 8/28 Intubation 8/28 EEG 8/30 Extubation 8/30  Antimicrobials:  None   Subjective: Patient seen examined bedside, resting comfortably. No specific complaints, concerns or questions this morning.  States "sorry you have to work today".  Denies headache, no dizziness, no chest pain, no palpitations, no shortness of breath, no abdominal pain, no fever/chills/night sweats, no  nausea/vomiting/diarrhea, no focal weakness, no fatigue, no paresthesias.  RN overnight concerned about patient's "not urinating", bladder scan unrevealing.  Otherwise no other acute events/concerns overnight per nursing staff.  Objective: Vitals:   03/19/23 2300 03/20/23 0420 03/20/23 0500 03/20/23 0815  BP: (!) 156/106 (!) 133/121  (!) 133/102  Pulse: 89   (!) 55  Resp: 18 20  20   Temp: 98.3 F (36.8 C) 98 F (36.7 C)  97.6 F (36.4 C)  TempSrc: Oral Oral  Oral  SpO2: 99% 100%  97%  Weight:   62.2 kg   Height:        Intake/Output Summary (Last 24 hours) at 03/20/2023 0914 Last data filed at 03/20/2023 0436 Gross per 24 hour  Intake 467.33 ml  Output 50 ml  Net 417.33 ml   Filed Weights   03/18/23 0500 03/19/23 0500 03/20/23 0500  Weight: 52.9 kg 64.2 kg 62.2 kg    Examination:  Physical Exam: GEN: NAD, alert and oriented x 3, elderly in appearance HEENT: NCAT, PERRL, EOMI, sclera clear, MMM PULM: CTAB w/o wheezes/crackles, normal respiratory effort, on 2 L nasal cannula with SpO2 100% at rest CV: irregularly irregular rhythm, normal rate, w/o M/G/R GI: abd soft, NTND, NABS, no R/G/M MSK: no peripheral edema, moves all extremities independently, noted dressing to right groin in place, clean/dry/intact NEURO: CN II-XII intact, no focal deficits, sensation to light touch intact PSYCH: normal mood/affect Integumentary: dry/intact, no rashes or wounds    Data Reviewed: I have personally reviewed following labs and imaging studies  CBC: Recent Labs  Lab 03/13/23 1030 03/14/23 1008 03/16/23 0809 03/17/23 0244 03/18/23 0209 03/19/23 0410 03/20/23 0402  WBC 7.0   < > 9.3 9.4 10.9* 9.0  11.1*  NEUTROABS 5.1  --   --   --   --   --   --   HGB 14.6   < > 11.3* 11.3* 10.8* 10.2* 12.0  HCT 45.1   < > 35.2* 35.4* 33.2* 31.3* 36.8  MCV 98.5   < > 100.0 98.1 97.6 98.7 97.6  PLT 181   < > 118* 134* 134* 138* 218   < > = values in this interval not displayed.   Basic  Metabolic Panel: Recent Labs  Lab 03/16/23 0809 03/16/23 1055 03/17/23 0244 03/18/23 0209 03/20/23 0402  NA 138 136 139 137 134*  K 3.8 3.5 3.6 4.3 5.0  CL 100 101 106 105 99  CO2 21* 22 22 22 24   GLUCOSE 130* 118* 143* 111* 126*  BUN 20 21 24* 30* 18  CREATININE 0.87 0.99 0.98 0.96 0.69  CALCIUM 8.4* 8.1* 8.5* 8.7* 9.0  MG 1.7 1.7 2.4 2.3 2.1  PHOS  --  2.4* 2.4* 2.1* 2.8   GFR: Estimated Creatinine Clearance: 43.7 mL/min (by C-G formula based on SCr of 0.69 mg/dL). Liver Function Tests: Recent Labs  Lab 03/13/23 1030 03/15/23 1027 03/16/23 1055 03/18/23 0209  AST 28 27 24  32  ALT 19 22 19 24   ALKPHOS 60 44 37* 39  BILITOT 1.3* 1.4* 2.4* 1.2  PROT 7.0 5.3* 4.9* 5.4*  ALBUMIN 3.6 2.7* 2.3* 2.5*   No results for input(s): "LIPASE", "AMYLASE" in the last 168 hours. Recent Labs  Lab 03/16/23 1055 03/17/23 1637  AMMONIA 42* 25   Coagulation Profile: Recent Labs  Lab 03/13/23 1030  INR 1.2   Cardiac Enzymes: No results for input(s): "CKTOTAL", "CKMB", "CKMBINDEX", "TROPONINI" in the last 168 hours. BNP (last 3 results) No results for input(s): "PROBNP" in the last 8760 hours. HbA1C: No results for input(s): "HGBA1C" in the last 72 hours.  CBG: Recent Labs  Lab 03/17/23 2046 03/18/23 0002 03/18/23 0327 03/18/23 0809 03/18/23 1227  GLUCAP 109* 128* 108* 109* 116*   Lipid Profile: No results for input(s): "CHOL", "HDL", "LDLCALC", "TRIG", "CHOLHDL", "LDLDIRECT" in the last 72 hours.  Thyroid Function Tests: No results for input(s): "TSH", "T4TOTAL", "FREET4", "T3FREE", "THYROIDAB" in the last 72 hours.  Anemia Panel: No results for input(s): "VITAMINB12", "FOLATE", "FERRITIN", "TIBC", "IRON", "RETICCTPCT" in the last 72 hours. Sepsis Labs: No results for input(s): "PROCALCITON", "LATICACIDVEN" in the last 168 hours.  Recent Results (from the past 240 hour(s))  SARS Coronavirus 2 by RT PCR (hospital order, performed in [facility] hospital lab)  *cepheid single result test* Anterior Nasal Swab     Status: None   Collection Time: 03/14/23  5:40 PM   Specimen: Anterior Nasal Swab  Result Value Ref Range Status   SARS Coronavirus 2 by RT PCR NEGATIVE NEGATIVE Final    Comment: Performed at [facility], [address] [place], [place] [address]  MRSA Next Gen by PCR, Nasal     Status: None   Collection Time: 03/16/23  5:09 AM   Specimen: Nasal Mucosa; Nasal Swab  Result Value Ref Range Status   MRSA by PCR Next Gen NOT DETECTED NOT DETECTED Final    Comment: (NOTE) The GeneXpert MRSA Assay (FDA approved for NASAL specimens only), is one component of a comprehensive MRSA colonization surveillance program. It is not intended to diagnose MRSA infection nor to guide or monitor treatment for MRSA infections. Test performance is not FDA approved in patients less than 2 years old. Performed at  [facility], [address] [place], [place] [address]   Culture, Respiratory w Gram Stain     Status: None   Collection Time: 03/16/23  5:09 AM   Specimen: Tracheal Aspirate; Respiratory  Result Value Ref Range Status   Specimen Description TRACHEAL ASPIRATE  Final   Special Requests NONE  Final   Gram Stain   Final    MODERATE WBC PRESENT,BOTH PMN AND MONONUCLEAR FEW GRAM POSITIVE COCCI IN PAIRS AND CHAINS    Culture   Final    Normal respiratory flora-no Staph aureus or Pseudomonas seen Performed at [facility], [address], [place], [place] [address]    Report Status 03/19/2023 FINAL  Final         Radiology Studies: No results found.      Scheduled Meds:  apixaban  5 mg Oral BID   aspirin  81 mg Oral Daily   atorvastatin  40 mg Oral Daily   Chlorhexidine Gluconate Cloth  6 each Topical Daily   feeding supplement  237 mL Oral BID BM   metoprolol tartrate  50 mg Oral BID   pneumococcal 20-valent conjugate vaccine  0.5 mL Intramuscular Tomorrow-1000   sodium chloride flush  3 mL Intravenous Q12H    Continuous Infusions:  sodium chloride 10 mL/hr at 03/18/23 1100   sodium chloride Stopped (03/20/23 0031)   sodium chloride     cefTRIAXone (ROCEPHIN)  IV 2 g (03/19/23 0904)     LOS: 7 days    Time spent: 48 minutes spent on chart review, discussion with nursing staff, consultants, updating family and interview/physical exam; more than 50% of that time was spent in counseling and/or coordination of care.    Alvira Philips [place], DO [facility] Available via Epic secure chat 7am-7pm After these hours, please refer to coverage provider listed on [email or web address] 03/20/2023, 9:14 AM

## 2023-03-20 NOTE — [place] FL2 (Signed)
[place] MEDICAID FL2 LEVEL OF CARE FORM     IDENTIFICATION  Patient Name: Sarah Wolf Birthdate: 18-Apr-1935 Sex: female Admission Date (Current Location): 03/13/2023  [facility] and [place] Number:  Producer, television/film/video and Address:  [facility], [address] [place], [place] [address]      Provider Number: [id number]  Attending Physician Name and Address:  [place], Eric J, DO  Relative Name and Phone Number:  Deeann Saint (nephew) [phone]    Current Level of Care: Hospital Recommended Level of Care: Skilled Nursing Facility Prior Approval Number:    Date Approved/Denied:   PASRR Number: [id number]  Discharge Plan: SNF    Current Diagnoses: Patient Active Problem List   Diagnosis Date Noted   Aspiration into airway 03/17/2023   Acute respiratory failure with hypoxia (HCC) 03/15/2023   Goals of care, counseling/discussion 03/15/2023   Atrial fibrillation with RVR (HCC) 03/13/2023   Critical limb ischemia of right lower extremity (HCC) 03/13/2023   Thrombus of right atrial appendage 03/13/2023   Myocardial injury due to Afib 03/13/2023   Chronic HFpEF 03/13/2023   History of DVT (deep vein thrombosis) 03/13/2023   Lesion of mediastinum 03/13/2023   Hyponatremia 03/13/2023   Pain due to onychomycosis of toenails of both feet 01/06/2023   Hip fracture (HCC) 03/08/2019   Closed comminuted intra-articular fracture of distal femur, right, initial encounter (HCC)    Closed displaced supracondylar fracture with intracondylar extension of lower end of left femur (HCC)     Orientation RESPIRATION BLADDER Height & Weight     Self, Situation  Normal Continent Weight: 137 lb 2 oz (62.2 kg) Height:  5\' 2"  (157.5 cm)  BEHAVIORAL SYMPTOMS/MOOD NEUROLOGICAL BOWEL NUTRITION STATUS      Continent Diet (Please see discharge summary)  AMBULATORY STATUS COMMUNICATION OF NEEDS Skin   Extensive Assist Verbally Other (Comment) (WDL, Wound/Incision LDAs)                        Personal Care Assistance Level of Assistance  Bathing, Feeding, Dressing Bathing Assistance: Maximum assistance Feeding assistance: Maximum assistance Dressing Assistance: Maximum assistance     Functional Limitations Info  Sight, Hearing, Speech Sight Info: Adequate (WDL) Hearing Info: Adequate (WDL) Speech Info: Adequate (WDL)    SPECIAL CARE FACTORS FREQUENCY  PT (By licensed PT), OT (By licensed OT)     PT Frequency: 5x min weekly OT Frequency: 5x min weekly            Contractures Contractures Info: Not present    Additional Factors Info  Code Status, Allergies Code Status Info: DNR-Interven Allergies Info: NKA           Current Medications (03/20/2023):  This is the current hospital active medication list Current Facility-Administered Medications  Medication Dose Route Frequency Provider Last Rate Last Admin   0.9 %  sodium chloride infusion  250 mL Intravenous Continuous Cephus Shelling, MD 10 mL/hr at 03/18/23 1100 Infusion Verify at 03/18/23 1100   0.9 %  sodium chloride infusion   Intravenous Continuous Cephus Shelling, MD   Stopped at 03/20/23 0031   0.9 %  sodium chloride infusion  250 mL Intravenous PRN Cephus Shelling, MD       0.9 %  sodium chloride infusion   Intravenous Continuous [place], Eric J, DO 75 mL/hr at 03/20/23 1041 New Bag at 03/20/23 1041   acetaminophen (TYLENOL) tablet 650 mg  650 mg Oral Q6H PRN Chestine Spore,  Canary Brim, MD   650 mg at 03/18/23 0201   Or   acetaminophen (TYLENOL) suppository 650 mg  650 mg Rectal Q6H PRN Cephus Shelling, MD       acetaminophen (TYLENOL) tablet 650 mg  650 mg Oral Q4H PRN [place], Eric J, DO       albuterol (PROVENTIL) (2.5 MG/3ML) 0.083% nebulizer solution 2.5 mg  2.5 mg Nebulization Q6H PRN Cephus Shelling, MD   2.5 mg at 03/18/23 0421   apixaban (ELIQUIS) tablet 5 mg  5 mg Oral BID Lorin Glass, MD   5 mg at 03/20/23 8413   aspirin chewable tablet 81 mg  81  mg Oral Daily [place], Eric J, DO   81 mg at 03/20/23 2440   atorvastatin (LIPITOR) tablet 40 mg  40 mg Oral Daily [place], Eric J, DO   40 mg at 03/20/23 1027   cefTRIAXone (ROCEPHIN) 2 g in sodium chloride 0.9 % 100 mL IVPB  2 g Intravenous Q24H Lorin Glass, MD 200 mL/hr at 03/20/23 1043 2 g at 03/20/23 1043   Chlorhexidine Gluconate Cloth 2 % PADS 6 each  6 each Topical Daily Cephus Shelling, MD   6 each at 03/18/23 0914   feeding supplement (ENSURE ENLIVE / ENSURE PLUS) liquid 237 mL  237 mL Oral BID BM Cephus Shelling, MD   237 mL at 03/20/23 0903   guaiFENesin-dextromethorphan (ROBITUSSIN DM) 100-10 MG/5ML syrup 5 mL  5 mL Oral Q4H PRN Lorin Glass, MD   5 mL at 03/18/23 0413   hydrALAZINE (APRESOLINE) injection 5 mg  5 mg Intravenous Q20 Min PRN Cephus Shelling, MD       hydrALAZINE (APRESOLINE) injection 5 mg  5 mg Intravenous Q20 Min PRN Cephus Shelling, MD       HYDROcodone-acetaminophen (NORCO/VICODIN) 5-325 MG per tablet 1 tablet  1 tablet Oral Q6H PRN Cephus Shelling, MD   1 tablet at 03/18/23 2536   labetalol (NORMODYNE) injection 10 mg  10 mg Intravenous Q10 min PRN Cephus Shelling, MD   10 mg at 03/18/23 1142   labetalol (NORMODYNE) injection 10 mg  10 mg Intravenous Q10 min PRN Cephus Shelling, MD       metoprolol tartrate (LOPRESSOR) tablet 50 mg  50 mg Oral BID Maisie Fus, MD   50 mg at 03/20/23 0903   ondansetron (ZOFRAN) injection 4 mg  4 mg Intravenous Q6H PRN Cephus Shelling, MD   4 mg at 03/18/23 1622   ondansetron (ZOFRAN) injection 4 mg  4 mg Intravenous Q6H PRN Cephus Shelling, MD       Oral care mouth rinse  15 mL Mouth Rinse PRN Lorin Glass, MD       phenol (CHLORASEPTIC) mouth spray 1 spray  1 spray Mouth/Throat PRN Lorin Glass, MD   1 spray at 03/18/23 0201   pneumococcal 20-valent conjugate vaccine (PREVNAR 20) injection 0.5 mL  0.5 mL Intramuscular Tomorrow-1000 Cephus Shelling, MD       sodium  chloride flush (NS) 0.9 % injection 3 mL  3 mL Intravenous Q12H Cephus Shelling, MD   3 mL at 03/20/23 6440   sodium chloride flush (NS) 0.9 % injection 3 mL  3 mL Intravenous PRN Cephus Shelling, MD         Discharge Medications: Please see discharge summary for a list of discharge medications.  Relevant Imaging Results:  Relevant Lab Results:  Additional Information [id number]  Delilah Shan, LCSWA

## 2023-03-20 NOTE — Care Management Important Message (Signed)
Important Message  Patient Details  Name: Sarah Wolf MRN: [id number] Date of Birth: 09-17-1934   Medicare Important Message Given:  Yes     Renie Ora 03/20/2023, 10:05 AM

## 2023-03-20 NOTE — Plan of Care (Signed)
?  Problem: Clinical Measurements: ?Goal: Will remain free from infection ?Outcome: Progressing ?  ?

## 2023-03-21 ENCOUNTER — Inpatient Hospital Stay (HOSPITAL_COMMUNITY): Payer: Medicare Other

## 2023-03-21 DIAGNOSIS — I70221 Atherosclerosis of native arteries of extremities with rest pain, right leg: Secondary | ICD-10-CM | POA: Diagnosis not present

## 2023-03-21 LAB — CBC
HCT: 36.9 % (ref 36.0–46.0)
Hemoglobin: 11.8 g/dL — ABNORMAL LOW (ref 12.0–15.0)
MCH: 32.5 pg (ref 26.0–34.0)
MCHC: 32 g/dL (ref 30.0–36.0)
MCV: 101.7 fL — ABNORMAL HIGH (ref 80.0–100.0)
Platelets: 203 10*3/uL (ref 150–400)
RBC: 3.63 MIL/uL — ABNORMAL LOW (ref 3.87–5.11)
RDW: 14.7 % (ref 11.5–15.5)
WBC: 12.1 10*3/uL — ABNORMAL HIGH (ref 4.0–10.5)
nRBC: 0.8 % — ABNORMAL HIGH (ref 0.0–0.2)

## 2023-03-21 LAB — BASIC METABOLIC PANEL
Anion gap: 11 (ref 5–15)
BUN: 20 mg/dL (ref 8–23)
CO2: 18 mmol/L — ABNORMAL LOW (ref 22–32)
Calcium: 8.4 mg/dL — ABNORMAL LOW (ref 8.9–10.3)
Chloride: 103 mmol/L (ref 98–111)
Creatinine, Ser: 0.73 mg/dL (ref 0.44–1.00)
GFR, Estimated: >60 mL/min (ref >60)
Glucose, Bld: 113 mg/dL — ABNORMAL HIGH (ref 70–99)
Potassium: 4.7 mmol/L (ref 3.5–5.1)
Sodium: 132 mmol/L — ABNORMAL LOW (ref 135–145)

## 2023-03-21 LAB — PROCALCITONIN: Procalcitonin: <0.1 ng/mL

## 2023-03-21 LAB — URINE CULTURE: Culture: <10000 — AB

## 2023-03-21 NOTE — Progress Notes (Signed)
Heart Failure Navigator Progress Note  Assessed for Heart & Vascular TOC clinic readiness.  Patient does not meet criteria has follow up with [facility] on 10/4.   Navigator will sign off at this time.    Krystian Ferrentino,RN, BSN,MSN Heart Failure Nurse Navigator. Contact by secure chat only.

## 2023-03-21 NOTE — Plan of Care (Signed)
°  Problem: Education: Goal: Knowledge of disease or condition will improve Outcome: Progressing   Problem: Activity: Goal: Ability to tolerate increased activity will improve Outcome: Progressing   Problem: Health Behavior/Discharge Planning: Goal: Ability to safely manage health-related needs after discharge will improve Outcome: Progressing

## 2023-03-21 NOTE — Progress Notes (Signed)
PROGRESS NOTE    Sarah Wolf  ZOX:[id number] DOB: 1935-05-04 DOA: 03/13/2023 PCP: Tally Joe, MD    Brief Narrative:   Sarah Wolf is a 88 y.o. female with past medical history significant for HTN, paroxysmal atrial fibrillation, history of DVT no longer on anticoagulation who presents to [facility] ED on 8/26 with complaint of bilateral lower extremity pain, greatest on right.  Onset morning of admission, pain all the way down her right leg severe nature.  Review of records note patient with left lower extremity DVT 2020 as well as echo noting A-fib at that time.  She was on Xarelto temporarily after being found to have a LLE DVT following a fall with femur fracture 03/08/2019.  At baseline lives alone, ambulatory with a cane.  Patient denied chest pain, no palpitations, no shortness of breath, no cough, no nausea/vomiting/diarrhea, no lower extremity edema.  And route with EMS patient was noted to be in A-fib with RVR.  In the emergency department patient was noted to be afebrile with pulse elevated up to 145 and atrial fibrillation, systolic blood pressures noted to be elevated into the 160s., and O2 saturations currently maintained on 1.5 L nasal cannula oxygen.  Labs noted sodium 133, potassium 3.5, chloride 93, TSH 2.94 , BNP 799.9, and high-sensitivity troponins 55->57.  Doppler ultrasounds of the lower extremity did not note any signs of a DVT, but did note occlusion of the proximal to mid SFA.  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  As a surgery have been formally consulted.  Patient had been started on heparin drip and given metoprolol IV intermittently.  Cardiology and vascular surgery were consulted.  TRH  consulted for admission for further evaluation and management of A-fib with RVR and acute occlusion of right SFA and left popliteal artery.  Significant Hospital events: 8/26 admitted to [facility] 8/28 thrombolysis of RLE with SFA stent, worsening encephalopathy and possible aspiration event leading to worsening hypoxia; pccm consulted and required intubation; Tx to ICU.  8/30: Extubated 9/1 transferred back to hospitalist service; pending SNF placement  Assessment & Plan:   Critical limb ischemia right lower extremity likely secondary to cardioembolic event Pain of pain to lower legs, acutely.  CT angiogram notable for acute occlusion right SFA and left popliteal artery.  Vascular surgery was consulted and patient underwent right lower extremity angiogram/aortogram with peripheral thrombectomy with SFA stent.   -- Aspirin 81 mg p.o. daily -- Eliquis 5 mg p.o. twice daily -- Atorvastatin 40 mg p.o. daily -- Follow-up 4-6 weeks vascular surgery with RLE duplex/ABIs  Acute respiratory failure with hypoxia Suspect aspiration event Following aortogram with thrombectomy, patient has suspected aspiration event and developed acute respiratory failure with hypoxia.  Patient required intubation and transferred to the intensive care unit.  Patient was successfully extubated on 03/17/2023.  Tracheal aspirate with normal respiratory flora, MRSA PCR negative.  Completed 7-day course of antibiotics with Unasyn followed by ceftriaxone. -- Incentive spirometry -- Continue supplemental oxygen, maintain SpO2 > 90%  Acute encephalopathy: Resolved Etiology likely secondary to respiratory failure with hypoxia/hypercarbia after aspiration event as above.  CT head negative.  EEG with no seizure or epileptiform discharges noted.  Ammonia level within normal limits. -- Supportive care  Paroxysmal atrial fibrillation  with RVR Previously noted to be in A-fib in 2020, but not on anticoagulation.  Given new embolic event,  now will likely need to be on lifelong anticoagulation.  Cardiology avoiding cardioversion in the setting of possible LAA appendage thrombus. -- Cardiology following, appreciate assistance -- Metoprolol tartrate 50 mg p.o. BID -- Eliquis 5 mg p.o. twice daily -- Continue to monitor on telemetry  Systolic congestive heart failure TTE with LVEF 40-45%, endocardial border not optimally defined to evaluate for regional wall motion abnormalities. -- Cardiology following -- Metoprolol tartrate 50 mg p.o. BID  Hx DVT -- Eliquis 5 mg p.o. twice daily  Hypophosphatemia Repleted. -- Repeat electrolytes in a.m.  Type 2 diabetes mellitus Hemoglobin A1c 6.6.  Diet controlled.  Urinary retention Required Foley catheter, now resolved. -- Monitor urine output  Lesion of mediastinum Noted incidental finding on CT chest of nodule anterior mediastinum -- Outpatient follow-up with CT surgery  Weakness/debility/deconditioning: PT/OT recommending SNF placement. -- TOC consulted   DVT prophylaxis: Heparin drip apixaban (ELIQUIS) tablet 5 mg    Code Status: Do not attempt resuscitation (DNR) PRE-ARREST INTERVENTIONS DESIRED Family Communication: No family present at bedside this morning, updated patient's nephew Sarah Wolf via telephone this morning  Disposition Plan:  Level of care: Progressive Status is: Inpatient Remains inpatient appropriate because: Pending placement, medically stable for discharge once bed available    Consultants:  Cardiology Vascular surgery  Procedures:  Angiogram/aortogram with thrombectomy and SFA stent, Dr. Sherral Hammers; vascular surgery 8/28 Intubation 8/28 EEG 8/30 Extubation 8/30  Antimicrobials:  Unasyn 8/28 - 8/30 Ceftriaxone 8/31 - 9/3   Subjective: Patient seen examined bedside, resting comfortably. No specific complaints, concerns or questions this morning.    Denies headache, no dizziness, no chest pain, no palpitations, no shortness of breath, no  abdominal pain, no fever/chills/night sweats, no nausea/vomiting/diarrhea, no focal weakness, no fatigue, no paresthesias. No acute events/concerns overnight per nursing staff.  Updated patient's nephew, but via telephone this morning.  Objective: Vitals:   03/20/23 1937 03/20/23 2304 03/21/23 0327 03/21/23 0700  BP: (!) 151/114 (!) 149/108  (!) 113/95  Pulse: (!) 113  76   Resp: 20 17 18 18   Temp: (!) 97.2 F (36.2 C) (!) 97.5 F (36.4 C) 97.9 F (36.6 C) (!) 97.5 F (36.4 C)  TempSrc: Axillary Oral Axillary Axillary  SpO2: 99% 91% 93%   Weight:   63.6 kg   Height:        Intake/Output Summary (Last 24 hours) at 03/21/2023 1045 Last data filed at 03/21/2023 0030 Gross per 24 hour  Intake 1096.39 ml  Output --  Net 1096.39 ml   Filed Weights   03/19/23 0500 03/20/23 0500 03/21/23 0327  Weight: 64.2 kg 62.2 kg 63.6 kg    Examination:  Physical Exam: GEN: NAD, alert and oriented x 3, elderly in appearance HEENT: NCAT, PERRL, EOMI, sclera clear, MMM PULM: CTAB w/o wheezes/crackles, normal respiratory effort, on 2 L nasal cannula with SpO2 100% at rest CV: irregularly irregular rhythm, normal rate, w/o M/G/R GI: abd soft, NTND, NABS, no R/G/M MSK: no peripheral edema, moves all extremities independently, noted dressing to right groin in place, clean/dry/intact NEURO: CN II-XII intact, no focal deficits, sensation to light touch intact PSYCH: normal mood/affect Integumentary: dry/intact, no rashes or wounds    Data Reviewed: I have personally reviewed following labs and imaging studies  CBC: Recent Labs  Lab 03/17/23 0244 03/18/23 0209 03/19/23 0410 03/20/23 0402 03/21/23 0312  WBC 9.4 10.9* 9.0 11.1* 12.1*  HGB 11.3* 10.8* 10.2* 12.0 11.8*  HCT 35.4* 33.2* 31.3* 36.8 36.9  MCV 98.1 97.6 98.7 97.6 101.7*  PLT 134* 134* 138* 218 203   Basic Metabolic Panel: Recent Labs  Lab 03/16/23 0809 03/16/23 1055 03/17/23 0244 03/18/23 0209 03/20/23 0402 03/21/23 0312   NA 138 136 139 137 134* 132*  K 3.8 3.5 3.6 4.3 5.0 4.7  CL 100 101 106 105 99 103  CO2 21* 22 22 22 24  18*  GLUCOSE 130* 118* 143* 111* 126* 113*  BUN 20 21 24* 30* 18 20  CREATININE 0.87 0.99 0.98 0.96 0.69 0.73  CALCIUM 8.4* 8.1* 8.5* 8.7* 9.0 8.4*  MG 1.7 1.7 2.4 2.3 2.1  --   PHOS  --  2.4* 2.4* 2.1* 2.8  --    GFR: Estimated Creatinine Clearance: 44.2 mL/min (by C-G formula based on SCr of 0.73 mg/dL). Liver Function Tests: Recent Labs  Lab 03/15/23 1027 03/16/23 1055 03/18/23 0209  AST 27 24 32  ALT 22 19 24   ALKPHOS 44 37* 39  BILITOT 1.4* 2.4* 1.2  PROT 5.3* 4.9* 5.4*  ALBUMIN 2.7* 2.3* 2.5*   No results for input(s): "LIPASE", "AMYLASE" in the last 168 hours. Recent Labs  Lab 03/16/23 1055 03/17/23 1637  AMMONIA 42* 25   Coagulation Profile: No results for input(s): "INR", "PROTIME" in the last 168 hours.  Cardiac Enzymes: No results for input(s): "CKTOTAL", "CKMB", "CKMBINDEX", "TROPONINI" in the last 168 hours. BNP (last 3 results) No results for input(s): "PROBNP" in the last 8760 hours. HbA1C: No results for input(s): "HGBA1C" in the last 72 hours.  CBG: Recent Labs  Lab 03/17/23 2046 03/18/23 0002 03/18/23 0327 03/18/23 0809 03/18/23 1227  GLUCAP 109* 128* 108* 109* 116*   Lipid Profile: No results for input(s): "CHOL", "HDL", "LDLCALC", "TRIG", "CHOLHDL", "LDLDIRECT" in the last 72 hours.  Thyroid Function Tests: No results for input(s): "TSH", "T4TOTAL", "FREET4", "T3FREE", "THYROIDAB" in the last 72 hours.  Anemia Panel: No results for input(s): "VITAMINB12", "FOLATE", "FERRITIN", "TIBC", "IRON", "RETICCTPCT" in the last 72 hours. Sepsis Labs: No results for input(s): "PROCALCITON", "LATICACIDVEN" in the last 168 hours.  Recent Results (from the past 240 hour(s))  SARS Coronavirus 2 by RT PCR (hospital order, performed in [facility] hospital lab) *cepheid single result test* Anterior Nasal Swab     Status: None   Collection Time:  03/14/23  5:40 PM   Specimen: Anterior Nasal Swab  Result Value Ref Range Status   SARS Coronavirus 2 by RT PCR NEGATIVE NEGATIVE Final    Comment: Performed at [facility] Lab, [address] [place], [place] [address]  MRSA Next Gen by PCR, Nasal     Status: None   Collection Time: 03/16/23  5:09 AM   Specimen: Nasal Mucosa; Nasal Swab  Result Value Ref Range Status   MRSA by PCR Next Gen NOT DETECTED NOT DETECTED Final    Comment: (NOTE) The GeneXpert MRSA Assay (FDA approved for NASAL specimens only), is one component of a comprehensive MRSA colonization surveillance program. It is not intended to diagnose MRSA infection nor to guide or monitor treatment for MRSA infections. Test performance is not FDA approved in patients less than 31 years old. Performed at [facility], [address], [place], [place] [address]   Culture, Respiratory w Gram Stain     Status: None   Collection Time: 03/16/23  5:09 AM   Specimen: Tracheal Aspirate; Respiratory  Result Value Ref Range Status  Specimen Description TRACHEAL ASPIRATE  Final   Special Requests NONE  Final   Gram Stain   Final    MODERATE WBC PRESENT,BOTH PMN AND MONONUCLEAR FEW GRAM POSITIVE COCCI IN PAIRS AND CHAINS    Culture   Final    Normal respiratory flora-no Staph aureus or Pseudomonas seen Performed at [facility], [address], [place], [place] [address]    Report Status 03/19/2023 FINAL  Final  Urine Culture     Status: Abnormal   Collection Time: 03/20/23  4:32 AM   Specimen: Urine, Clean Catch  Result Value Ref Range Status   Specimen Description URINE, CLEAN CATCH  Final   Special Requests NONE  Final   Culture (A)  Final    <10,000 COLONIES/mL INSIGNIFICANT GROWTH Performed at [facility], [address] [place], [place] [address]    Report Status 03/21/2023 FINAL  Final         Radiology Studies: No results found.      Scheduled Meds:  apixaban  5 mg Oral BID    aspirin  81 mg Oral Daily   atorvastatin  40 mg Oral Daily   Chlorhexidine Gluconate Cloth  6 each Topical Daily   feeding supplement  237 mL Oral BID BM   metoprolol tartrate  50 mg Oral BID   pneumococcal 20-valent conjugate vaccine  0.5 mL Intramuscular Tomorrow-1000   sodium chloride flush  3 mL Intravenous Q12H   Continuous Infusions:  sodium chloride 10 mL/hr at 03/18/23 1100   sodium chloride Stopped (03/20/23 0031)   sodium chloride       LOS: 8 days    Time spent: 48 minutes spent on chart review, discussion with nursing staff, consultants, updating family and interview/physical exam; more than 50% of that time was spent in counseling and/or coordination of care.    Alvira Philips [place], DO [facility] Available via Epic secure chat 7am-7pm After these hours, please refer to coverage provider listed on [email or web address] 03/21/2023, 10:45 AM

## 2023-03-21 NOTE — Progress Notes (Signed)
Physical Therapy Treatment Patient Details Name: Sarah Wolf MRN: [id number] DOB: Jul 21, 1934 Today's Date: 03/21/2023   History of Present Illness Pt is 88 year old who presents to [facility] on 8/26 with complaint of bilateral lower extremity pain, greatest on right. Pt with critical limb ischemia RLE.  8/28 patient underwent thrombolysis of RLE. Worsening encephalopathy post procedure.Aspiration event leading to hypoxia and worsening AMS.  Intubated 8/28- 8/30 PMH - HTN, paroxysmal atrial fibrillation, history of DVT no longer on anticoagulation    PT Comments  Pt received in supine and agreeable to session. Pt demonstrating progress towards functional mobility goals and is able to perform all mobility tasks with up to min A this session. Pt requesting to use the [facility] and is able to have BM, requiring assist with pericare. Gait distance limited by decreased activity tolerance and pt demonstrating increased instability with turns. Pt able to stand from recliner x4 with CGA and seated rest breaks due to fatigue. Pt continues to benefit from PT services to progress toward functional mobility goals.    If plan is discharge home, recommend the following: A lot of help with walking and/or transfers;A lot of help with bathing/dressing/bathroom;Assistance with cooking/housework;Assist for transportation;Help with stairs or ramp for entrance   Can travel by private vehicle     No  Equipment Recommendations  Rolling walker (2 wheels)    Recommendations for Other Services       Precautions / Restrictions Precautions Precautions: Fall Precaution Comments: Active humeral fx to R arm Restrictions Weight Bearing Restrictions: No     Mobility  Bed Mobility Overal bed mobility: Needs Assistance Bed Mobility: Supine to Sit     Supine to sit: Contact guard, Used rails     General bed mobility comments: cues for sequencing and use of rail    Transfers Overall transfer level: Needs  assistance Equipment used: Rolling walker (2 wheels) Transfers: Sit to/from Stand, Bed to chair/wheelchair/BSC Sit to Stand: Min assist, Contact guard assist   Step pivot transfers: Contact guard assist       General transfer comment: STS from EOB with min A for balance due to posterior lean progressing to CGA to stand from recliner x4. step pivot to BSC from EOB with cues for direction and RW management    Ambulation/Gait Ambulation/Gait assistance: Contact guard assist Gait Distance (Feet): 5 Feet Assistive device: Rolling walker (2 wheels) Gait Pattern/deviations: Step-through pattern, Trunk flexed, Decreased stride length       General Gait Details: Pt demonstrating short steps with mild instability, but no LOB. CGA for safety and cues for upright posture, however pt not improving.      Balance Overall balance assessment: Needs assistance Sitting-balance support: No upper extremity supported, Feet supported Sitting balance-Leahy Scale: Fair Sitting balance - Comments: sitting EOB and in recliner. tendency for posterior lean in recliner due to fatigue   Standing balance support: Bilateral upper extremity supported, During functional activity, Reliant on assistive device for balance Standing balance-Leahy Scale: Poor Standing balance comment: with RW support                            Cognition Arousal: Alert Behavior During Therapy: WFL for tasks assessed/performed Overall Cognitive Status: No family/caregiver present to determine baseline cognitive functioning  Exercises      General Comments        Pertinent Vitals/Pain Pain Assessment Faces Pain Scale: Hurts a little bit Pain Location: RUE Pain Descriptors / Indicators: Grimacing Pain Intervention(s): Limited activity within patient's tolerance, Monitored during session, Repositioned     PT Goals (current goals can now be found in the  care plan section) Acute Rehab PT Goals Patient Stated Goal: not stated PT Goal Formulation: With patient Time For Goal Achievement: 03/31/23 Potential to Achieve Goals: Good Progress towards PT goals: Progressing toward goals    Frequency    Min 1X/week      PT Plan         AM-PAC PT "6 Clicks" Mobility   Outcome Measure  Help needed turning from your back to your side while in a flat bed without using bedrails?: A Little Help needed moving from lying on your back to sitting on the side of a flat bed without using bedrails?: A Little Help needed moving to and from a bed to a chair (including a wheelchair)?: A Little Help needed standing up from a chair using your arms (e.g., wheelchair or bedside chair)?: A Little Help needed to walk in hospital room?: A Little Help needed climbing 3-5 steps with a railing? : Total 6 Click Score: 16    End of Session Equipment Utilized During Treatment: Oxygen;Gait belt Activity Tolerance: Patient tolerated treatment well;Patient limited by fatigue Patient left: in chair;with call bell/phone within reach Nurse Communication: Mobility status PT Visit Diagnosis: Unsteadiness on feet (R26.81);Other abnormalities of gait and mobility (R26.89);Muscle weakness (generalized) (M62.81)     Time: 5409-8119 PT Time Calculation (min) (ACUTE ONLY): 23 min  Charges:    $Gait Training: 8-22 mins $Therapeutic Activity: 8-22 mins PT General Charges $$ ACUTE PT VISIT: 1 Visit                     Johny Shock, PTA Acute Rehabilitation Services Secure Chat Preferred  [phone]    Johny Shock 03/21/2023, 3:55 PM

## 2023-03-21 NOTE — Plan of Care (Signed)
  Problem: Activity: Goal: Risk for activity intolerance will decrease Outcome: Progressing   

## 2023-03-22 ENCOUNTER — Inpatient Hospital Stay (HOSPITAL_COMMUNITY): Payer: Medicare Other

## 2023-03-22 DIAGNOSIS — I70221 Atherosclerosis of native arteries of extremities with rest pain, right leg: Secondary | ICD-10-CM | POA: Diagnosis not present

## 2023-03-22 LAB — CBC
HCT: 44.7 % (ref 36.0–46.0)
Hemoglobin: 15.3 g/dL — ABNORMAL HIGH (ref 12.0–15.0)
MCH: 32.7 pg (ref 26.0–34.0)
MCHC: 34.2 g/dL (ref 30.0–36.0)
MCV: 95.5 fL (ref 80.0–100.0)
Platelets: 289 10*3/uL (ref 150–400)
RBC: 4.68 MIL/uL (ref 3.87–5.11)
RDW: 15 % (ref 11.5–15.5)
WBC: 13.6 10*3/uL — ABNORMAL HIGH (ref 4.0–10.5)
nRBC: 0.5 % — ABNORMAL HIGH (ref 0.0–0.2)

## 2023-03-22 LAB — BASIC METABOLIC PANEL
Anion gap: 13 (ref 5–15)
BUN: 30 mg/dL — ABNORMAL HIGH (ref 8–23)
CO2: 20 mmol/L — ABNORMAL LOW (ref 22–32)
Calcium: 8.5 mg/dL — ABNORMAL LOW (ref 8.9–10.3)
Chloride: 102 mmol/L (ref 98–111)
Creatinine, Ser: 1.13 mg/dL — ABNORMAL HIGH (ref 0.44–1.00)
GFR, Estimated: 47 mL/min — ABNORMAL LOW (ref >60)
Glucose, Bld: 119 mg/dL — ABNORMAL HIGH (ref 70–99)
Potassium: 4.6 mmol/L (ref 3.5–5.1)
Sodium: 135 mmol/L (ref 135–145)

## 2023-03-22 MED ORDER — SODIUM CHLORIDE 0.9 % IV SOLN
12.5000 mg | Freq: Once | INTRAVENOUS | Status: AC
  Administered 2023-03-22: 12.5 mg via INTRAVENOUS
  Filled 2023-03-22: qty 12.5

## 2023-03-22 MED ORDER — MENTHOL 3 MG MT LOZG
1.0000 | LOZENGE | OROMUCOSAL | Status: DC | PRN
  Administered 2023-03-22: 3 mg via ORAL
  Filled 2023-03-22: qty 9

## 2023-03-22 NOTE — Progress Notes (Signed)
PROGRESS NOTE    Sarah Wolf  ZOX:[id number] DOB: 12-22-1934 DOA: 03/13/2023 PCP: Tally Joe, MD    Brief Narrative:   Sarah Wolf is a 88 y.o. female with past medical history significant for HTN, paroxysmal atrial fibrillation, history of DVT no longer on anticoagulation who presents to [facility] ED on 8/26 with complaint of bilateral lower extremity pain, greatest on right.  Onset morning of admission, pain all the way down her right leg severe nature.  Review of records note patient with left lower extremity DVT 2020 as well as echo noting A-fib at that time.  She was on Xarelto temporarily after being found to have a LLE DVT following a fall with femur fracture 03/08/2019.  At baseline lives alone, ambulatory with a cane.  Patient denied chest pain, no palpitations, no shortness of breath, no cough, no nausea/vomiting/diarrhea, no lower extremity edema.  And route with EMS patient was noted to be in A-fib with RVR.  In the emergency department patient was noted to be afebrile with pulse elevated up to 145 and atrial fibrillation, systolic blood pressures noted to be elevated into the 160s., and O2 saturations currently maintained on 1.5 L nasal cannula oxygen.  Labs noted sodium 133, potassium 3.5, chloride 93, TSH 2.94 , BNP 799.9, and high-sensitivity troponins 55->57.  Doppler ultrasounds of the lower extremity did not note any signs of a DVT, but did note occlusion of the proximal to mid SFA.  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  As a surgery have been formally consulted.  Patient had been started on heparin drip and given metoprolol IV intermittently.  Cardiology and vascular surgery were consulted.  TRH  consulted for admission for further evaluation and management of A-fib with RVR and acute occlusion of right SFA and left popliteal artery.  Significant Hospital events: 8/26 admitted to [facility] 8/28 thrombolysis of RLE with SFA stent, worsening encephalopathy and possible aspiration event leading to worsening hypoxia; pccm consulted and required intubation; Tx to ICU.  8/30: Extubated 9/1 transferred back to hospitalist service; pending SNF placement  Assessment & Plan:   Critical limb ischemia right lower extremity likely secondary to cardioembolic event Pain of pain to lower legs, acutely.  CT angiogram notable for acute occlusion right SFA and left popliteal artery.  Vascular surgery was consulted and patient underwent right lower extremity angiogram/aortogram with peripheral thrombectomy with SFA stent.   -- Aspirin 81 mg p.o. daily -- Eliquis 5 mg p.o. twice daily -- Atorvastatin 40 mg p.o. daily -- Follow-up 4-6 weeks vascular surgery with RLE duplex/ABIs  Acute respiratory failure with hypoxia Suspect aspiration event Following aortogram with thrombectomy, patient has suspected aspiration event and developed acute respiratory failure with hypoxia.  Patient required intubation and transferred to the intensive care unit.  Patient was successfully extubated on 03/17/2023.  Tracheal aspirate with normal respiratory flora, MRSA PCR negative.  Completed 7-day course of antibiotics with Unasyn followed by ceftriaxone. -- Incentive spirometry -- Continue supplemental oxygen, maintain SpO2 > 90%; now titrated off to room air  Acute encephalopathy: Resolved Etiology likely secondary to respiratory failure with hypoxia/hypercarbia after aspiration event as above.  CT head negative.  EEG with no seizure or epileptiform discharges noted.  Ammonia level within normal limits. --  Supportive care  Paroxysmal atrial fibrillation with RVR Previously noted to be in A-fib in 2020, but not on  anticoagulation.  Given new embolic event, now will likely need to be on lifelong anticoagulation.  Cardiology avoiding cardioversion in the setting of possible LAA appendage thrombus. -- Cardiology following, appreciate assistance -- Metoprolol tartrate 50 mg p.o. BID -- Eliquis 5 mg p.o. twice daily -- Continue to monitor on telemetry  Systolic congestive heart failure TTE with LVEF 40-45%, endocardial border not optimally defined to evaluate for regional wall motion abnormalities.  Seen by cardiology and now signed off. -- Metoprolol tartrate 50 mg p.o. BID  Hx DVT -- Eliquis 5 mg p.o. twice daily  Hypophosphatemia Repleted.  Type 2 diabetes mellitus Hemoglobin A1c 6.6.  Diet controlled.  Urinary retention Required Foley catheter, now resolved. -- Monitor urine output  Lesion of mediastinum Noted incidental finding on CT chest of nodule anterior mediastinum -- Outpatient follow-up with CT surgery  Weakness/debility/deconditioning: PT/OT recommending SNF placement. -- TOC consulted   DVT prophylaxis: Heparin drip apixaban (ELIQUIS) tablet 5 mg    Code Status: Do not attempt resuscitation (DNR) PRE-ARREST INTERVENTIONS DESIRED Family Communication: No family present at bedside this morning, updated patient's nephew Bud via telephone yesterday afternoon  Disposition Plan:  Level of care: Progressive Status is: Inpatient Remains inpatient appropriate because: Pending placement, medically stable for discharge once bed available    Consultants:  Cardiology Vascular surgery  Procedures:  Angiogram/aortogram with thrombectomy and SFA stent, Dr. Sherral Hammers; vascular surgery 8/28 Intubation 8/28 EEG 8/30 Extubation 8/30  Antimicrobials:  Unasyn 8/28 - 8/30 Ceftriaxone 8/31 - 9/3   Subjective: Patient seen examined bedside, resting comfortably.  Lying in bed.  No specific complaints, concerns or questions this morning.    Denies headache, no dizziness, no chest pain,  no palpitations, no shortness of breath, no abdominal pain, no fever/chills/night sweats, no nausea/vomiting/diarrhea, no focal weakness, no fatigue, no paresthesias. No acute events/concerns overnight per nursing staff.  Medically stable for discharge to SNF once bed/insurance authorization received.  Objective: Vitals:   03/21/23 2246 03/22/23 0432 03/22/23 0757 03/22/23 0853  BP: 135/65 (!) 133/57 91/67 98/78   Pulse:  78 80 (!) 107  Resp: 20 16 10 19   Temp: (!) 97.4 F (36.3 C) (!) 97.5 F (36.4 C) (!) 97.4 F (36.3 C) (!) 97.4 F (36.3 C)  TempSrc: Axillary Oral Oral Oral  SpO2: 98% 98% 95% 98%  Weight:  63.6 kg    Height:        Intake/Output Summary (Last 24 hours) at 03/22/2023 0957 Last data filed at 03/21/2023 2105 Gross per 24 hour  Intake 103 ml  Output --  Net 103 ml   Filed Weights   03/20/23 0500 03/21/23 0327 03/22/23 0432  Weight: 62.2 kg 63.6 kg 63.6 kg    Examination:  Physical Exam: GEN: NAD, alert and oriented x 3, elderly in appearance HEENT: NCAT, PERRL, EOMI, sclera clear, MMM PULM: CTAB w/o wheezes/crackles, normal respiratory effort, on room air CV: irregularly irregular rhythm, normal rate, w/o M/G/R GI: abd soft, NTND, NABS, no R/G/M MSK: no peripheral edema, moves all extremities independently NEURO: CN II-XII intact, no focal deficits, sensation to light touch intact PSYCH: normal mood/affect Integumentary: dry/intact, no rashes or wounds    Data Reviewed: I have personally reviewed following labs and imaging studies  CBC: Recent Labs  Lab 03/18/23 0209 03/19/23 0410 03/20/23 0402 03/21/23 0312 03/22/23 0357  WBC 10.9* 9.0 11.1* 12.1* 13.6*  HGB 10.8* 10.2*  12.0 11.8* 15.3*  HCT 33.2* 31.3* 36.8 36.9 44.7  MCV 97.6 98.7 97.6 101.7* 95.5  PLT 134* 138* 218 203 289   Basic Metabolic Panel: Recent Labs  Lab 03/16/23 0809 03/16/23 1055 03/17/23 0244 03/18/23 0209 03/20/23 0402 03/21/23 0312  NA 138 136 139 137 134* 132*  K  3.8 3.5 3.6 4.3 5.0 4.7  CL 100 101 106 105 99 103  CO2 21* 22 22 22 24  18*  GLUCOSE 130* 118* 143* 111* 126* 113*  BUN 20 21 24* 30* 18 20  CREATININE 0.87 0.99 0.98 0.96 0.69 0.73  CALCIUM 8.4* 8.1* 8.5* 8.7* 9.0 8.4*  MG 1.7 1.7 2.4 2.3 2.1  --   PHOS  --  2.4* 2.4* 2.1* 2.8  --    GFR: Estimated Creatinine Clearance: 44.2 mL/min (by C-G formula based on SCr of 0.73 mg/dL). Liver Function Tests: Recent Labs  Lab 03/15/23 1027 03/16/23 1055 03/18/23 0209  AST 27 24 32  ALT 22 19 24   ALKPHOS 44 37* 39  BILITOT 1.4* 2.4* 1.2  PROT 5.3* 4.9* 5.4*  ALBUMIN 2.7* 2.3* 2.5*   No results for input(s): "LIPASE", "AMYLASE" in the last 168 hours. Recent Labs  Lab 03/16/23 1055 03/17/23 1637  AMMONIA 42* 25   Coagulation Profile: No results for input(s): "INR", "PROTIME" in the last 168 hours.  Cardiac Enzymes: No results for input(s): "CKTOTAL", "CKMB", "CKMBINDEX", "TROPONINI" in the last 168 hours. BNP (last 3 results) No results for input(s): "PROBNP" in the last 8760 hours. HbA1C: No results for input(s): "HGBA1C" in the last 72 hours.  CBG: Recent Labs  Lab 03/17/23 2046 03/18/23 0002 03/18/23 0327 03/18/23 0809 03/18/23 1227  GLUCAP 109* 128* 108* 109* 116*   Lipid Profile: No results for input(s): "CHOL", "HDL", "LDLCALC", "TRIG", "CHOLHDL", "LDLDIRECT" in the last 72 hours.  Thyroid Function Tests: No results for input(s): "TSH", "T4TOTAL", "FREET4", "T3FREE", "THYROIDAB" in the last 72 hours.  Anemia Panel: No results for input(s): "VITAMINB12", "FOLATE", "FERRITIN", "TIBC", "IRON", "RETICCTPCT" in the last 72 hours. Sepsis Labs: Recent Labs  Lab 03/21/23 1053  PROCALCITON <0.10    Recent Results (from the past 240 hour(s))  SARS Coronavirus 2 by RT PCR (hospital order, performed in [facility] hospital lab) *cepheid single result test* Anterior Nasal Swab     Status: None   Collection Time: 03/14/23  5:40 PM   Specimen: Anterior Nasal Swab   Result Value Ref Range Status   SARS Coronavirus 2 by RT PCR NEGATIVE NEGATIVE Final    Comment: Performed at [facility], [address], [place], [place] [address]  MRSA Next Gen by PCR, Nasal     Status: None   Collection Time: 03/16/23  5:09 AM   Specimen: Nasal Mucosa; Nasal Swab  Result Value Ref Range Status   MRSA by PCR Next Gen NOT DETECTED NOT DETECTED Final    Comment: (NOTE) The GeneXpert MRSA Assay (FDA approved for NASAL specimens only), is one component of a comprehensive MRSA colonization surveillance program. It is not intended to diagnose MRSA infection nor to guide or monitor treatment for MRSA infections. Test performance is not FDA approved in patients less than 83 years old. Performed at [facility], [address], [place], [place] [address]   Culture, Respiratory w Gram Stain     Status: None   Collection Time: 03/16/23  5:09 AM   Specimen: Tracheal Aspirate; Respiratory  Result Value Ref Range Status   Specimen Description  TRACHEAL ASPIRATE  Final   Special Requests NONE  Final   Gram Stain   Final    MODERATE WBC PRESENT,BOTH PMN AND MONONUCLEAR FEW GRAM POSITIVE COCCI IN PAIRS AND CHAINS    Culture   Final    Normal respiratory flora-no Staph aureus or Pseudomonas seen Performed at [facility], [address] [place], [place] [address]    Report Status 03/19/2023 FINAL  Final  Urine Culture     Status: Abnormal   Collection Time: 03/20/23  4:32 AM   Specimen: Urine, Clean Catch  Result Value Ref Range Status   Specimen Description URINE, CLEAN CATCH  Final   Special Requests NONE  Final   Culture (A)  Final    <10,000 COLONIES/mL INSIGNIFICANT GROWTH Performed at [facility], [address] [place], [place] [address]    Report Status 03/21/2023 FINAL  Final         Radiology Studies: DG Abd 1 View  Result Date: 03/22/2023 CLINICAL DATA:  Vomiting. EXAM: ABDOMEN - 1 VIEW COMPARISON:  None Available.  FINDINGS: No abnormal bowel dilatation.  Possible left renal calculus. IMPRESSION: No abnormal bowel dilatation.  Possible left renal calculus. Aortic Atherosclerosis (ICD10-I70.0). Electronically Signed   By: Lupita Raider M.D.   On: 03/22/2023 08:58   DG CHEST PORT 1 VIEW  Result Date: 03/21/2023 CLINICAL DATA:  Short of breath EXAM: PORTABLE CHEST 1 VIEW COMPARISON:  Radiograph 03/17/2023 FINDINGS: Interval extubation. Stable large cardiac silhouette. Interval increase in LEFT pleural effusion. There is a large LEFT diaphragmatic hernia with transverse colon herniated into the LEFT lower chest. Mild atelectasis versus airspace disease in the RIGHT lower lobe similar. No pneumothorax. IMPRESSION: 1. Increase in LEFT pleural effusion. 2. Large diaphragmatic hernia with LEFT colon herniated the LEFT chest. 3. Increase in RIGHT basilar atelectasis and effusion. Electronically Signed   By: Genevive Bi M.D.   On: 03/21/2023 15:04        Scheduled Meds:  apixaban  5 mg Oral BID   aspirin  81 mg Oral Daily   atorvastatin  40 mg Oral Daily   Chlorhexidine Gluconate Cloth  6 each Topical Daily   feeding supplement  237 mL Oral BID BM   metoprolol tartrate  50 mg Oral BID   pneumococcal 20-valent conjugate vaccine  0.5 mL Intramuscular Tomorrow-1000   sodium chloride flush  3 mL Intravenous Q12H   Continuous Infusions:  sodium chloride 10 mL/hr at 03/18/23 1100   sodium chloride Stopped (03/20/23 0031)   sodium chloride       LOS: 9 days    Time spent: 48 minutes spent on chart review, discussion with nursing staff, consultants, updating family and interview/physical exam; more than 50% of that time was spent in counseling and/or coordination of care.    Alvira Philips [place], DO [facility] Available via Epic secure chat 7am-7pm After these hours, please refer to coverage provider listed on [email or web address] 03/22/2023, 9:57 AM

## 2023-03-22 NOTE — Progress Notes (Signed)
Mobility Specialist Progress Note:   03/22/23 1100  Mobility  Activity Transferred from bed to chair  Level of Assistance Minimal assist, patient does 75% or more  Assistive Device Front wheel walker  Distance Ambulated (ft) 4 ft  Activity Response Tolerated well  Mobility Referral Yes  $Mobility charge 1 Mobility  Mobility Specialist Start Time (ACUTE ONLY) 1130  Mobility Specialist Stop Time (ACUTE ONLY) 1147  Mobility Specialist Time Calculation (min) (ACUTE ONLY) 17 min    Pre Mobility: 123 HR During Mobility: 129 HR Post Mobility:  114 HR  Pt received in bed, agreeable to mobility. Found to have had BM in bed. Assisted w/ peri care. Asymptomatic throughout w/ no complaints. Pt left in chair with call bell and family present. Chair alarm on.  D'Vante Earlene Plater Mobility Specialist Please contact via Special educational needs teacher or Rehab office at [phone]

## 2023-03-23 ENCOUNTER — Inpatient Hospital Stay (HOSPITAL_COMMUNITY): Payer: Medicare Other

## 2023-03-23 DIAGNOSIS — I739 Peripheral vascular disease, unspecified: Secondary | ICD-10-CM

## 2023-03-23 DIAGNOSIS — I70221 Atherosclerosis of native arteries of extremities with rest pain, right leg: Secondary | ICD-10-CM | POA: Diagnosis not present

## 2023-03-23 LAB — CBC
HCT: 36.7 % (ref 36.0–46.0)
Hemoglobin: 12 g/dL (ref 12.0–15.0)
MCH: 31.3 pg (ref 26.0–34.0)
MCHC: 32.7 g/dL (ref 30.0–36.0)
MCV: 95.6 fL (ref 80.0–100.0)
Platelets: 307 10*3/uL (ref 150–400)
RBC: 3.84 MIL/uL — ABNORMAL LOW (ref 3.87–5.11)
RDW: 15.2 % (ref 11.5–15.5)
WBC: 14.4 10*3/uL — ABNORMAL HIGH (ref 4.0–10.5)
nRBC: 0.4 % — ABNORMAL HIGH (ref 0.0–0.2)

## 2023-03-23 LAB — BASIC METABOLIC PANEL
Anion gap: 10 (ref 5–15)
BUN: 31 mg/dL — ABNORMAL HIGH (ref 8–23)
CO2: 22 mmol/L (ref 22–32)
Calcium: 8.7 mg/dL — ABNORMAL LOW (ref 8.9–10.3)
Chloride: 101 mmol/L (ref 98–111)
Creatinine, Ser: 1.03 mg/dL — ABNORMAL HIGH (ref 0.44–1.00)
GFR, Estimated: 53 mL/min — ABNORMAL LOW (ref >60)
Glucose, Bld: 109 mg/dL — ABNORMAL HIGH (ref 70–99)
Potassium: 4.2 mmol/L (ref 3.5–5.1)
Sodium: 133 mmol/L — ABNORMAL LOW (ref 135–145)

## 2023-03-23 LAB — PHOSPHORUS: Phosphorus: 3.3 mg/dL (ref 2.5–4.6)

## 2023-03-23 LAB — MAGNESIUM: Magnesium: 2.1 mg/dL (ref 1.7–2.4)

## 2023-03-23 NOTE — Progress Notes (Signed)
Mobility Specialist Progress Note:   03/23/23 1629  Mobility  Activity Ambulated with assistance in hallway  Level of Assistance Minimal assist, patient does 75% or more  Assistive Device Front wheel walker  Distance Ambulated (ft) 95 ft  Activity Response Tolerated well  Mobility Referral Yes  $Mobility charge 1 Mobility  Mobility Specialist Start Time (ACUTE ONLY) 1605  Mobility Specialist Stop Time (ACUTE ONLY) 1629  Mobility Specialist Time Calculation (min) (ACUTE ONLY) 24 min   Pre Mobility: 104 HR  Post Mobility: 102 HR   Pt received in bed, agreeable to mobility. MinA bed mobility and to stand. CG during ambulation. Pt c/o low energy after session otherwise asymptomatic throughout. Pt returned to bed with call bell in hand and all needs met. Bed alarm on.   Leory Plowman  Mobility Specialist Please contact via SecureChat Rehab office at [phone]

## 2023-03-23 NOTE — TOC Progression Note (Signed)
Transition of Care [facility]) - Progression Note    Patient Details  Name: Samariyah Martins MRN: [id number] Date of Birth: [birth year]  Transition of [facility]) CM/SW Contact  Eduard Roux, [place] Phone Number: 03/23/2023, 12:59 PM  Clinical Narrative:     Called patient's niece,Meagan- she states she will call CSW back.  Antony Blackbird, MSW, LCSW Clinical Social Worker    Expected Discharge Plan: Skilled Nursing Facility Barriers to Discharge: Continued Medical Work up  Expected Discharge Plan and Services In-house Referral: Clinical Social Work Discharge Planning Services: CM Consult   Living arrangements for the past 2 months: Single Family Home                   DME Agency: NA                   Social Determinants of Health (SDOH) Interventions SDOH Screenings   Food Insecurity: No Food Insecurity (03/13/2023)  Housing: Medium Risk (03/13/2023)  Transportation Needs: No Transportation Needs (03/13/2023)  Utilities: Not At Risk (03/13/2023)  Tobacco Use: Low Risk  (03/14/2023)    Readmission Risk Interventions     No data to display

## 2023-03-23 NOTE — Progress Notes (Signed)
The patient's niece, her POA, wants the doctor to call her as soon as possible to see what the plan is for the patient.

## 2023-03-23 NOTE — TOC Progression Note (Signed)
Transition of Care [facility]) - Progression Note    Patient Details  Name: Sarah Wolf MRN: [id number] Date of Birth: 1935-05-20  [facility] CM/SW Contact  Eduard Roux, [place] Phone Number: 03/23/2023, 3:38 PM  Clinical Narrative:     CSW spoke with patient's niece,Meagan- informed of short term rehab bed offers. [facility] decided on [name].   Attempted to start SNF auth/portal appears to be down at this time- TOC will try again tomorrow.   TOC will continue to follow and assist with discharge planning.   Antony Blackbird, MSW, LCSW Clinical Social Worker    Expected Discharge Plan: Skilled Nursing Facility Barriers to Discharge: Continued Medical Work up  Expected Discharge Plan and Services In-house Referral: Clinical Social Work Discharge Planning Services: CM Consult   Living arrangements for the past 2 months: Single Family Home                   DME Agency: NA                   Social Determinants of Health (SDOH) Interventions SDOH Screenings   Food Insecurity: No Food Insecurity (03/13/2023)  Housing: Medium Risk (03/13/2023)  Transportation Needs: No Transportation Needs (03/13/2023)  Utilities: Not At Risk (03/13/2023)  Tobacco Use: Low Risk  (03/14/2023)    Readmission Risk Interventions     No data to display

## 2023-03-23 NOTE — Progress Notes (Signed)
PROGRESS NOTE    Sarah Wolf  ZOX:[id number] DOB: 1935/04/13 DOA: 03/13/2023 PCP: Tally Joe, MD     Brief Narrative:  Sarah Wolf is a 88 y.o. female with past medical history significant for HTN, paroxysmal atrial fibrillation, history of DVT no longer on anticoagulation who presents to [facility] ED on 8/26 with complaint of bilateral lower extremity pain, greatest on right.  Onset morning of admission, pain all the way down her right leg severe nature.  Review of records note patient with left lower extremity DVT 2020 as well as echo noting A-fib at that time.  She was on Xarelto temporarily after being found to have a LLE DVT following a fall with femur fracture 03/08/2019.  At baseline lives alone, ambulatory with a cane.  Patient denied chest pain, no palpitations, no shortness of breath, no cough, no nausea/vomiting/diarrhea, no lower extremity edema.  And route with EMS patient was noted to be in A-fib with RVR.   In the emergency department patient was noted to be afebrile with pulse elevated up to 145 and atrial fibrillation, systolic blood pressures noted to be elevated into the 160s., and O2 saturations currently maintained on 1.5 L nasal cannula oxygen.  Labs noted sodium 133, potassium 3.5, chloride 93, TSH 2.94 , BNP 799.9, and high-sensitivity troponins 55->57.  Doppler ultrasounds of the lower extremity did not note any signs of a DVT, but did note occlusion of the proximal to mid SFA.  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  As a surgery have been formally consulted.  Patient had been started on heparin drip and given metoprolol IV intermittently.  Cardiology and vascular surgery were consulted.  TRH  consulted for admission for further evaluation and management of A-fib with RVR and acute occlusion of right SFA and left popliteal artery.   Significant Hospital events: 8/26 admitted to [facility] 8/28 thrombolysis of RLE with SFA stent, worsening encephalopathy and possible aspiration event leading to worsening hypoxia; pccm consulted and required intubation; Tx to ICU.  8/30: Extubated 9/1 transferred back to hospitalist service; pending SNF placement  New events last 24 hours / Subjective: Patient seen with nephew at bedside.  Patient herself has no complaints today.  I spoke with her niece over the phone concern regarding intermittent nausea and vomiting, reported coffee-ground emesis, hernia on imaging as well as her dusky fingers.  Assessment & Plan:   Principal Problem:   Critical limb ischemia of right lower extremity (HCC) Active Problems:   Atrial fibrillation with RVR (HCC)   Thrombus of right atrial appendage   Chronic HFpEF   Myocardial injury due to Afib   History of DVT (deep vein thrombosis)   Lesion of mediastinum   Hyponatremia   Acute respiratory failure with hypoxia (HCC)   Goals of care, counseling/discussion   Aspiration into airway    Critical limb ischemia right lower extremity likely secondary to cardioembolic event Pain of pain to lower legs, acutely.  CT angiogram notable for acute occlusion right SFA and left popliteal artery.  Vascular surgery was consulted and patient underwent right lower extremity angiogram/aortogram with peripheral thrombectomy with SFA stent.   -- Aspirin 81 mg p.o. daily -- Eliquis 5 mg p.o. twice daily -- Atorvastatin 40 mg p.o. daily --  Follow-up 4-6 weeks vascular surgery with RLE duplex/ABIs   Acute respiratory failure with hypoxia Suspect aspiration event Following aortogram with thrombectomy, patient has suspected aspiration event and developed acute respiratory failure with hypoxia.  Patient required intubation and transferred  to the intensive care unit.  Patient was successfully extubated on 03/17/2023.  Tracheal aspirate with normal respiratory flora, MRSA PCR negative.  Completed 7-day course of antibiotics with Unasyn followed by ceftriaxone. -- Incentive spirometry -- Continue supplemental oxygen, maintain SpO2 > 90%; now titrated off to room air   Acute encephalopathy: Resolved Etiology likely secondary to respiratory failure with hypoxia/hypercarbia after aspiration event as above.  CT head negative.  EEG with no seizure or epileptiform discharges noted.  Ammonia level within normal limits. -- Supportive care   Paroxysmal atrial fibrillation with RVR Previously noted to be in A-fib in 2020, but not on anticoagulation.  Given new embolic event, now will likely need to be on lifelong anticoagulation.  Cardiology avoiding cardioversion in the setting of possible LAA appendage thrombus. -- Cardiology following, appreciate assistance -- Metoprolol tartrate 50 mg p.o. BID -- Eliquis 5 mg p.o. twice daily -- Continue to monitor on telemetry   Systolic congestive heart failure TTE with LVEF 40-45%, endocardial border not optimally defined to evaluate for regional wall motion abnormalities.  Seen by cardiology and now signed off. -- Metoprolol tartrate 50 mg p.o. BID   Hx DVT -- Eliquis 5 mg p.o. twice daily   Type 2 diabetes mellitus Hemoglobin A1c 6.6.  Diet controlled.   Urinary retention Required Foley catheter, now resolved. -- Monitor urine output   Lesion of mediastinum Noted incidental finding on CT chest of nodule anterior mediastinum -- Outpatient follow-up with CT surgery   Weakness/debility/deconditioning: PT/OT recommending SNF placement. -- TOC consulted  CKD stage IIIa -- Stable  Large diaphragmatic hernia -- Seen on chest x-ray 9/3 -- Could consider outpatient general surgery consultation if surgical intervention is desired once her acute illness is stable  Intermittent vomiting  with coffee-ground emesis -- Reported by niece, continue to monitor today.  I have not heard any reports of emesis today  Poor perfusion of her upper extremities --Her fingers bilaterally seem dusky on examination.  This has been ongoing since last week per niece.  Obtain vascular duplex of upper extremities today   DVT prophylaxis:  apixaban (ELIQUIS) tablet 5 mg  Code Status: DNR, no CPR if pulseless Family Communication: Niece over the phone Disposition Plan: SNF Status is: Inpatient Remains inpatient appropriate because: Not medically ready for discharge.  SNF search ongoing    Antimicrobials:  Anti-infectives (From admission, onward)    Start     Dose/Rate Route Frequency Ordered Stop   03/18/23 0830  cefTRIAXone (ROCEPHIN) 2 g in sodium chloride 0.9 % 100 mL IVPB        2 g 200 mL/hr over 30 Minutes Intravenous Every 24 hours 03/18/23 0731 03/21/23 0952   03/15/23 2330  Ampicillin-Sulbactam (UNASYN) 3 g in sodium chloride 0.9 % 100 mL IVPB  Status:  Discontinued        3 g 200 mL/hr over 30 Minutes Intravenous Every 6 hours 03/15/23 2239 03/18/23 0731        Objective: Vitals:   03/23/23 0248 03/23/23 0307 03/23/23 0909 03/23/23 1200  BP: 137/66  (!) 130/90 135/65  Pulse: (!) 101  92   Resp: 15  20 14   Temp: (!) 97.4 F (36.3 C)  97.8 F (36.6 C) (!) 97.4 F (36.3 C)  TempSrc: Oral  Oral   SpO2: 100%  94% 98%  Weight:  64.1 kg    Height:        Intake/Output Summary (Last 24 hours) at 03/23/2023 1327 Last data filed at 03/22/2023 2155 Gross per 24 hour  Intake 100 ml  Output --  Net 100 ml   Filed Weights   03/21/23 0327 03/22/23 0432 03/23/23 0307  Weight: 63.6 kg 63.6 kg 64.1 kg    Examination:  General exam: Appears calm and comfortable  Respiratory system: Clear to auscultation. Respiratory effort normal. No respiratory distress. No conversational dyspnea.  Cardiovascular system: S1 & S2 heard, RRR. No murmurs. No pedal edema. Gastrointestinal  system: Abdomen is nondistended, soft and nontender. Normal bowel sounds heard. Central nervous system: Alert and oriented. No focal neurological deficits. Speech clear.  Extremities: Symmetric in appearance  Skin: + Bilateral fingers dusky in appearance, warm to touch Psychiatry: Judgement and insight appear normal. Mood & affect appropriate.   Data Reviewed: I have personally reviewed following labs and imaging studies  CBC: Recent Labs  Lab 03/19/23 0410 03/20/23 0402 03/21/23 0312 03/22/23 0357 03/23/23 1025  WBC 9.0 11.1* 12.1* 13.6* 14.4*  HGB 10.2* 12.0 11.8* 15.3* 12.0  HCT 31.3* 36.8 36.9 44.7 36.7  MCV 98.7 97.6 101.7* 95.5 95.6  PLT 138* 218 203 289 307   Basic Metabolic Panel: Recent Labs  Lab 03/17/23 0244 03/18/23 0209 03/20/23 0402 03/21/23 0312 03/22/23 0843 03/23/23 1025  NA 139 137 134* 132* 135 133*  K 3.6 4.3 5.0 4.7 4.6 4.2  CL 106 105 99 103 102 101  CO2 22 22 24  18* 20* 22  GLUCOSE 143* 111* 126* 113* 119* 109*  BUN 24* 30* 18 20 30* 31*  CREATININE 0.98 0.96 0.69 0.73 1.13* 1.03*  CALCIUM 8.5* 8.7* 9.0 8.4* 8.5* 8.7*  MG 2.4 2.3 2.1  --   --  2.1  PHOS 2.4* 2.1* 2.8  --   --  3.3   GFR: Estimated Creatinine Clearance: 34.5 mL/min (A) (by C-G formula based on SCr of 1.03 mg/dL (H)). Liver Function Tests: Recent Labs  Lab 03/18/23 0209  AST 32  ALT 24  ALKPHOS 39  BILITOT 1.2  PROT 5.4*  ALBUMIN 2.5*   No results for input(s): "LIPASE", "AMYLASE" in the last 168 hours. Recent Labs  Lab 03/17/23 1637  AMMONIA 25   Coagulation Profile: No results for input(s): "INR", "PROTIME" in the last 168 hours. Cardiac Enzymes: No results for input(s): "CKTOTAL", "CKMB", "CKMBINDEX", "TROPONINI" in the last 168 hours. BNP (last 3 results) No results for input(s): "PROBNP" in the last 8760 hours. HbA1C: No results for input(s): "HGBA1C" in the last 72 hours. CBG: Recent Labs  Lab 03/17/23 2046 03/18/23 0002 03/18/23 0327 03/18/23 0809  03/18/23 1227  GLUCAP 109* 128* 108* 109* 116*   Lipid Profile: No results for input(s): "CHOL", "HDL", "LDLCALC", "TRIG", "CHOLHDL", "LDLDIRECT" in the last 72 hours. Thyroid Function Tests: No results for input(s): "TSH", "T4TOTAL", "FREET4", "T3FREE", "THYROIDAB" in the last 72 hours. Anemia Panel: No results for input(s): "VITAMINB12", "FOLATE", "FERRITIN", "TIBC", "IRON", "RETICCTPCT" in the last 72 hours. Sepsis Labs: Recent Labs  Lab 03/21/23 1053  PROCALCITON <0.10    Recent Results (from the past 240 hour(s))  SARS Coronavirus 2 by RT PCR (hospital order, performed in [facility] hospital lab) *cepheid single result test* Anterior Nasal Swab     Status: None   Collection Time: 03/14/23  5:40 PM   Specimen: Anterior Nasal  Swab  Result Value Ref Range Status   SARS Coronavirus 2 by RT PCR NEGATIVE NEGATIVE Final    Comment: Performed at [facility], [address] [place], [place] [address]  MRSA Next Gen by PCR, Nasal     Status: None   Collection Time: 03/16/23  5:09 AM   Specimen: Nasal Mucosa; Nasal Swab  Result Value Ref Range Status   MRSA by PCR Next Gen NOT DETECTED NOT DETECTED Final    Comment: (NOTE) The GeneXpert MRSA Assay (FDA approved for NASAL specimens only), is one component of a comprehensive MRSA colonization surveillance program. It is not intended to diagnose MRSA infection nor to guide or monitor treatment for MRSA infections. Test performance is not FDA approved in patients less than 52 years old. Performed at [facility], [address] [place], [place] [address]   Culture, Respiratory w Gram Stain     Status: None   Collection Time: 03/16/23  5:09 AM   Specimen: Tracheal Aspirate; Respiratory  Result Value Ref Range Status   Specimen Description TRACHEAL ASPIRATE  Final   Special Requests NONE  Final   Gram Stain   Final    MODERATE WBC PRESENT,BOTH PMN AND MONONUCLEAR FEW GRAM POSITIVE COCCI IN PAIRS AND CHAINS     Culture   Final    Normal respiratory flora-no Staph aureus or Pseudomonas seen Performed at [facility], [address], [place], [place] [address]    Report Status 03/19/2023 FINAL  Final  Urine Culture     Status: Abnormal   Collection Time: 03/20/23  4:32 AM   Specimen: Urine, Clean Catch  Result Value Ref Range Status   Specimen Description URINE, CLEAN CATCH  Final   Special Requests NONE  Final   Culture (A)  Final    <10,000 COLONIES/mL INSIGNIFICANT GROWTH Performed at [facility], [address] [place], [place] [address]    Report Status 03/21/2023 FINAL  Final      Radiology Studies: DG Abd 1 View  Result Date: 03/22/2023 CLINICAL DATA:  Vomiting. EXAM: ABDOMEN - 1 VIEW COMPARISON:  None Available. FINDINGS: No abnormal bowel dilatation.  Possible left renal calculus. IMPRESSION: No abnormal bowel dilatation.  Possible left renal calculus. Aortic Atherosclerosis (ICD10-I70.0). Electronically Signed   By: Lupita Raider M.D.   On: 03/22/2023 08:58      Scheduled Meds:  apixaban  5 mg Oral BID   aspirin  81 mg Oral Daily   atorvastatin  40 mg Oral Daily   Chlorhexidine Gluconate Cloth  6 each Topical Daily   feeding supplement  237 mL Oral BID BM   metoprolol tartrate  50 mg Oral BID   pneumococcal 20-valent conjugate vaccine  0.5 mL Intramuscular Tomorrow-1000   sodium chloride flush  3 mL Intravenous Q12H   Continuous Infusions:  sodium chloride 10 mL/hr at 03/18/23 1100   sodium chloride Stopped (03/20/23 0031)   sodium chloride       LOS: 10 days   Time spent: 45 minutes   Noralee Stain, DO [facility] 03/23/2023, 1:27 PM   Available via Epic secure chat 7am-7pm After these hours, please refer to coverage provider listed on [email or web address]

## 2023-03-23 NOTE — Progress Notes (Signed)
Physical Therapy Treatment Patient Details Name: Sarah Wolf MRN: [id number] DOB: 03-Jan-1935 Today's Date: 03/23/2023   History of Present Illness Pt is 88 year old who presents to [facility] on 8/26 with complaint of bilateral lower extremity pain, greatest on right. Pt with critical limb ischemia RLE.  8/28 patient underwent thrombolysis of RLE. Worsening encephalopathy post procedure.Aspiration event leading to hypoxia and worsening AMS.  Intubated 8/28- 8/30 PMH - HTN, paroxysmal atrial fibrillation, history of DVT no longer on anticoagulation    PT Comments  Pt received in supine and agreeable to session. Pt demonstrating good progress towards functional mobility goals and improved activity tolerance this session. Pt requires up to min A for mobility tasks this session. Pt able to tolerate increased gait distance with improved stability with RW this session. Pt continues to benefit from PT services to progress toward functional mobility goals.     If plan is discharge home, recommend the following: A lot of help with walking and/or transfers;A lot of help with bathing/dressing/bathroom;Assistance with cooking/housework;Assist for transportation;Help with stairs or ramp for entrance   Can travel by private vehicle     No  Equipment Recommendations  Rolling walker (2 wheels)    Recommendations for Other Services       Precautions / Restrictions Precautions Precautions: Fall Precaution Comments: Active humeral fx to R arm Restrictions Weight Bearing Restrictions: No     Mobility  Bed Mobility Overal bed mobility: Needs Assistance Bed Mobility: Supine to Sit     Supine to sit: Used rails, Supervision, HOB elevated          Transfers Overall transfer level: Needs assistance Equipment used: Rolling walker (2 wheels) Transfers: Sit to/from Stand, Bed to chair/wheelchair/BSC Sit to Stand: Min assist, Contact guard assist   Step pivot transfers: Contact guard assist        General transfer comment: STS from EOB with min A and recliner with CGA. cues for hand placement    Ambulation/Gait Ambulation/Gait assistance: Contact guard assist Gait Distance (Feet): 100 Feet Assistive device: Rolling walker (2 wheels) Gait Pattern/deviations: Step-through pattern, Trunk flexed, Decreased stride length       General Gait Details: slow steady gait with RW support. Cues for upright posture,forward gaze, and RW proximity. CGA for safety      Balance Overall balance assessment: Needs assistance Sitting-balance support: No upper extremity supported, Feet supported Sitting balance-Leahy Scale: Fair Sitting balance - Comments: sitting EOB   Standing balance support: Bilateral upper extremity supported, During functional activity, Reliant on assistive device for balance Standing balance-Leahy Scale: Poor Standing balance comment: with RW support                            Cognition Arousal: Alert Behavior During Therapy: WFL for tasks assessed/performed Overall Cognitive Status: No family/caregiver present to determine baseline cognitive functioning                                          Exercises      General Comments General comments (skin integrity, edema, etc.): VSS on RA. HR up to 120's with ambulation      Pertinent Vitals/Pain Pain Assessment Pain Assessment: No/denies pain     PT Goals (current goals can now be found in the care plan section) Progress towards PT goals: Progressing toward goals    Frequency  Min 1X/week      PT Plan         AM-PAC PT "6 Clicks" Mobility   Outcome Measure  Help needed turning from your back to your side while in a flat bed without using bedrails?: A Little Help needed moving from lying on your back to sitting on the side of a flat bed without using bedrails?: A Little Help needed moving to and from a bed to a chair (including a wheelchair)?: A Little Help needed  standing up from a chair using your arms (e.g., wheelchair or bedside chair)?: A Little Help needed to walk in hospital room?: A Little Help needed climbing 3-5 steps with a railing? : Total 6 Click Score: 16    End of Session Equipment Utilized During Treatment: Gait belt Activity Tolerance: Patient tolerated treatment well;Patient limited by fatigue Patient left: in chair;with call bell/phone within reach;with family/visitor present;with chair alarm set Nurse Communication: Mobility status PT Visit Diagnosis: Unsteadiness on feet (R26.81);Other abnormalities of gait and mobility (R26.89);Muscle weakness (generalized) (M62.81)     Time: 5409-8119 PT Time Calculation (min) (ACUTE ONLY): 18 min  Charges:    $Gait Training: 8-22 mins PT General Charges $$ ACUTE PT VISIT: 1 Visit                     Johny Shock, PTA Acute Rehabilitation Services Secure Chat Preferred  [phone]    Johny Shock 03/23/2023, 10:26 AM

## 2023-03-24 ENCOUNTER — Inpatient Hospital Stay (HOSPITAL_COMMUNITY): Payer: Medicare Other

## 2023-03-24 DIAGNOSIS — Z8719 Personal history of other diseases of the digestive system: Secondary | ICD-10-CM

## 2023-03-24 DIAGNOSIS — K922 Gastrointestinal hemorrhage, unspecified: Secondary | ICD-10-CM

## 2023-03-24 DIAGNOSIS — Z7901 Long term (current) use of anticoagulants: Secondary | ICD-10-CM | POA: Diagnosis not present

## 2023-03-24 DIAGNOSIS — I70221 Atherosclerosis of native arteries of extremities with rest pain, right leg: Secondary | ICD-10-CM | POA: Diagnosis not present

## 2023-03-24 LAB — CBC
HCT: 40 % (ref 36.0–46.0)
Hemoglobin: 13.4 g/dL (ref 12.0–15.0)
MCH: 32.2 pg (ref 26.0–34.0)
MCHC: 33.5 g/dL (ref 30.0–36.0)
MCV: 96.2 fL (ref 80.0–100.0)
Platelets: 238 10*3/uL (ref 150–400)
RBC: 4.16 MIL/uL (ref 3.87–5.11)
RDW: 15.2 % (ref 11.5–15.5)
WBC: 18.3 10*3/uL — ABNORMAL HIGH (ref 4.0–10.5)
nRBC: 0.2 % (ref 0.0–0.2)

## 2023-03-24 LAB — OCCULT BLOOD X 1 CARD TO LAB, STOOL: Fecal Occult Bld: POSITIVE — AB

## 2023-03-24 LAB — HEMOGLOBIN AND HEMATOCRIT, BLOOD
HCT: 37.7 % (ref 36.0–46.0)
HCT: 38.6 % (ref 36.0–46.0)
Hemoglobin: 12.3 g/dL (ref 12.0–15.0)
Hemoglobin: 12.4 g/dL (ref 12.0–15.0)

## 2023-03-24 LAB — BASIC METABOLIC PANEL
Anion gap: 18 — ABNORMAL HIGH (ref 5–15)
BUN: 26 mg/dL — ABNORMAL HIGH (ref 8–23)
CO2: 25 mmol/L (ref 22–32)
Calcium: 9 mg/dL (ref 8.9–10.3)
Chloride: 96 mmol/L — ABNORMAL LOW (ref 98–111)
Creatinine, Ser: 0.94 mg/dL (ref 0.44–1.00)
GFR, Estimated: 59 mL/min — ABNORMAL LOW (ref >60)
Glucose, Bld: 108 mg/dL — ABNORMAL HIGH (ref 70–99)
Potassium: 4.1 mmol/L (ref 3.5–5.1)
Sodium: 139 mmol/L (ref 135–145)

## 2023-03-24 LAB — BLOOD GAS, ARTERIAL
Acid-Base Excess: 2.8 mmol/L — ABNORMAL HIGH (ref 0.0–2.0)
Bicarbonate: 28.5 mmol/L — ABNORMAL HIGH (ref 20.0–28.0)
O2 Saturation: 100 %
Patient temperature: 37.2
pCO2 arterial: 47 mmHg (ref 32–48)
pH, Arterial: 7.39 (ref 7.35–7.45)
pO2, Arterial: 171 mmHg — ABNORMAL HIGH (ref 83–108)

## 2023-03-24 LAB — LACTIC ACID, PLASMA
Lactic Acid, Venous: 2.3 mmol/L (ref 0.5–1.9)
Lactic Acid, Venous: 2.9 mmol/L (ref 0.5–1.9)

## 2023-03-24 LAB — BRAIN NATRIURETIC PEPTIDE: B Natriuretic Peptide: 880.7 pg/mL — ABNORMAL HIGH (ref 0.0–100.0)

## 2023-03-24 MED ORDER — FUROSEMIDE 10 MG/ML IJ SOLN
20.0000 mg | Freq: Once | INTRAMUSCULAR | Status: AC
  Administered 2023-03-24: 20 mg via INTRAVENOUS
  Filled 2023-03-24: qty 2

## 2023-03-24 MED ORDER — LORAZEPAM 2 MG/ML IJ SOLN
0.5000 mg | Freq: Once | INTRAMUSCULAR | Status: AC
  Administered 2023-03-24: 0.5 mg via INTRAVENOUS
  Filled 2023-03-24: qty 1

## 2023-03-24 MED ORDER — METOPROLOL TARTRATE 5 MG/5ML IV SOLN
5.0000 mg | INTRAVENOUS | Status: DC | PRN
  Administered 2023-03-24: 5 mg via INTRAVENOUS
  Filled 2023-03-24: qty 5

## 2023-03-24 MED ORDER — METOPROLOL TARTRATE 5 MG/5ML IV SOLN: 5.0000 mg | Freq: Once | INTRAVENOUS | Status: DC

## 2023-03-24 MED ORDER — PANTOPRAZOLE SODIUM 40 MG IV SOLR
40.0000 mg | Freq: Two times a day (BID) | INTRAVENOUS | Status: DC
  Administered 2023-03-24 – 2023-03-30 (×14): 40 mg via INTRAVENOUS
  Filled 2023-03-24 (×14): qty 10

## 2023-03-24 MED ORDER — FENTANYL CITRATE PF 50 MCG/ML IJ SOSY
12.5000 ug | PREFILLED_SYRINGE | INTRAMUSCULAR | Status: DC | PRN
  Administered 2023-03-24: 12.5 ug via INTRAVENOUS
  Filled 2023-03-24: qty 1

## 2023-03-24 MED ORDER — ALUM & MAG HYDROXIDE-SIMETH 200-200-20 MG/5ML PO SUSP
30.0000 mL | Freq: Once | ORAL | Status: AC
  Administered 2023-03-24: 30 mL via ORAL
  Filled 2023-03-24: qty 30

## 2023-03-24 MED ORDER — MORPHINE SULFATE (PF) 2 MG/ML IV SOLN
1.0000 mg | INTRAVENOUS | Status: DC | PRN
  Administered 2023-03-24 (×2): 2 mg via INTRAVENOUS
  Filled 2023-03-24 (×2): qty 1

## 2023-03-24 MED ORDER — METOPROLOL TARTRATE 5 MG/5ML IV SOLN
5.0000 mg | Freq: Once | INTRAVENOUS | Status: DC
  Filled 2023-03-24: qty 5

## 2023-03-24 MED ORDER — FUROSEMIDE 10 MG/ML IJ SOLN
40.0000 mg | Freq: Once | INTRAMUSCULAR | Status: AC
  Administered 2023-03-24: 40 mg via INTRAVENOUS
  Filled 2023-03-24: qty 4

## 2023-03-24 MED ORDER — SODIUM CHLORIDE 0.9 % IV SOLN
12.5000 mg | Freq: Once | INTRAVENOUS | Status: AC | PRN
  Administered 2023-03-24: 12.5 mg via INTRAVENOUS
  Filled 2023-03-24: qty 0.5

## 2023-03-24 MED ORDER — SODIUM CHLORIDE 0.9 % IV SOLN
3.0000 g | Freq: Three times a day (TID) | INTRAVENOUS | Status: DC
  Administered 2023-03-24 – 2023-03-25 (×4): 3 g via INTRAVENOUS
  Filled 2023-03-24 (×4): qty 8

## 2023-03-24 MED ORDER — MORPHINE SULFATE (PF) 2 MG/ML IV SOLN: 1.0000 mg | INTRAVENOUS | Status: DC | PRN

## 2023-03-24 MED ORDER — SODIUM CHLORIDE 0.9 % IV SOLN
12.5000 mg | Freq: Once | INTRAVENOUS | Status: DC
  Filled 2023-03-24: qty 0.5

## 2023-03-24 MED ORDER — PANTOPRAZOLE SODIUM 40 MG PO TBEC
40.0000 mg | DELAYED_RELEASE_TABLET | Freq: Once | ORAL | Status: DC
  Filled 2023-03-24: qty 1

## 2023-03-24 MED ORDER — METOPROLOL TARTRATE 5 MG/5ML IV SOLN
5.0000 mg | Freq: Once | INTRAVENOUS | Status: AC | PRN
  Administered 2023-03-24: 5 mg via INTRAVENOUS

## 2023-03-24 NOTE — Progress Notes (Signed)
  Progress Note    03/24/2023 11:09 AM 8 Days Post-Op  Subjective:  RN called with concerns of patient in extreme pain in right thigh and very faint doppler signals   Vitals:   03/24/23 0930 03/24/23 1005  BP: (!) 187/157 (!) 149/127  Pulse: (!) 104 (!) 46  Resp: 19 19  Temp:    SpO2: 97% 97%   Physical Exam: Cardiac:  tachy Lungs:  non labored Extremities:  2+ femoral pulse, Doppler right PT signal. Foot warm, motor and sensation intact Abdomen:  soft Neurologic: alert and oriented  CBC    Component Value Date/Time   WBC 14.4 (H) 03/23/2023 1025   RBC 3.84 (L) 03/23/2023 1025   HGB 12.0 03/23/2023 1025   HCT 36.7 03/23/2023 1025   PLT 307 03/23/2023 1025   MCV 95.6 03/23/2023 1025   MCH 31.3 03/23/2023 1025   MCHC 32.7 03/23/2023 1025   RDW 15.2 03/23/2023 1025   LYMPHSABS 1.2 03/13/2023 1030   MONOABS 0.5 03/13/2023 1030   EOSABS 0.2 03/13/2023 1030   BASOSABS 0.1 03/13/2023 1030    BMET    Component Value Date/Time   NA 133 (L) 03/23/2023 1025   K 4.2 03/23/2023 1025   CL 101 03/23/2023 1025   CO2 22 03/23/2023 1025   GLUCOSE 109 (H) 03/23/2023 1025   BUN 31 (H) 03/23/2023 1025   CREATININE 1.03 (H) 03/23/2023 1025   CALCIUM 8.7 (L) 03/23/2023 1025   GFRNONAA 53 (L) 03/23/2023 1025   GFRAA >60 03/11/2019 0535    INR    Component Value Date/Time   INR 1.2 03/13/2023 1030     Intake/Output Summary (Last 24 hours) at 03/24/2023 1109 Last data filed at 03/24/2023 0930 Gross per 24 hour  Intake --  Output 1350 ml  Net -1350 ml     Assessment/Plan:  88 y.o. female is s/p s/p right lower extremity revascularization for acute on chronic ischemia  8 Days Post-Op   Right lower extremity remains well perfused and warm She has palpable femoral and Doppler PT signals in right foot. This is unchanged from post intervention I suspect that her pain is from spasms or she thinks she may have twisted wrong when she was transferring to her bed yesterday Pain  improved with helping patient get to chair Vascular will be available as needed    Graceann Congress, PA-C [facility] [phone] 03/24/2023 11:09 AM

## 2023-03-24 NOTE — Progress Notes (Signed)
Pharmacy Antibiotic Note  Sarah Wolf is a 88 y.o. female admitted on 03/13/2023.  Pharmacy has been consulted for Unasyn dosing for aspiration pneumonia. WBC mildly elevated. CrCl ~30-35.   Plan: Unasyn 3g IV q8h  Height: 5\' 2"  (157.5 cm) Weight: 64.1 kg (141 lb 5 oz) IBW/kg (Calculated) : 50.1  Temp (24hrs), Avg:97.6 F (36.4 C), Min:97.4 F (36.3 C), Max:97.8 F (36.6 C)  Recent Labs  Lab 03/18/23 0209 03/19/23 0410 03/20/23 0402 03/21/23 0312 03/22/23 0357 03/22/23 0843 03/23/23 1025  WBC 10.9* 9.0 11.1* 12.1* 13.6*  --  14.4*  CREATININE 0.96  --  0.69 0.73  --  1.13* 1.03*    Estimated Creatinine Clearance: 34.5 mL/min (A) (by C-G formula based on SCr of 1.03 mg/dL (H)).    No Known Allergies  Abran Duke, PharmD, BCPS Clinical Pharmacist Phone: [phone]

## 2023-03-24 NOTE — TOC Progression Note (Signed)
Transition of Care [facility]) - Progression Note    Patient Details  Name: Sarah Wolf MRN: [id number] Date of Birth: 12-20-1934  [facility] CM/SW Contact  Eduard Roux, [place] Phone Number: 03/24/2023, 2:13 PM  Clinical Narrative:     9:45am - spoke with patient's niece- she requested assistance with Medicaid application-   2:30pm- emailed FC for family - for assistance w/ medicaid application   TOC continues to follow for medical readiness.   Antony Blackbird, MSW, LCSW Clinical Social Worker       Expected Discharge Plan: Skilled Nursing Facility Barriers to Discharge: Continued Medical Work up  Expected Discharge Plan and Services In-house Referral: Clinical Social Work Discharge Planning Services: CM Consult   Living arrangements for the past 2 months: Single Family Home                   DME Agency: NA                   Social Determinants of Health (SDOH) Interventions SDOH Screenings   Food Insecurity: No Food Insecurity (03/13/2023)  Housing: Medium Risk (03/13/2023)  Transportation Needs: No Transportation Needs (03/13/2023)  Utilities: Not At Risk (03/13/2023)  Tobacco Use: Low Risk  (03/14/2023)    Readmission Risk Interventions     No data to display

## 2023-03-24 NOTE — Progress Notes (Signed)
Physical Therapy Treatment Patient Details Name: Sarah Wolf MRN: [id number] DOB: 09/14/1934 Today's Date: 03/24/2023   History of Present Illness Pt is 88 year old who presents to [facility] on 8/26 with complaint of bilateral lower extremity pain, greatest on right. Pt with critical limb ischemia RLE.  8/28 patient underwent thrombolysis of RLE. Worsening encephalopathy post procedure.Aspiration event leading to hypoxia and worsening AMS.  Intubated 8/28- 8/30 PMH - HTN, paroxysmal atrial fibrillation, history of DVT no longer on anticoagulation    PT Comments  Pt received sitting EOB and agreeable to session. Pt's family member reports increased confusion and intermittent agitation today that "is not like her". Pt requesting to use the [facility] and is able to void and perform pericare with assist. Pt declining further ambulation due to fatigue. Pt requires increased assist and cues to stand from EOB and BSC this session due to slight posterior bias and weakness. Pt continues to benefit from PT services to progress toward functional mobility goals.    If plan is discharge home, recommend the following: A lot of help with walking and/or transfers;A lot of help with bathing/dressing/bathroom;Assistance with cooking/housework;Assist for transportation;Help with stairs or ramp for entrance   Can travel by private vehicle     No  Equipment Recommendations  Rolling walker (2 wheels)    Recommendations for Other Services       Precautions / Restrictions Precautions Precautions: Fall Precaution Comments: Active humeral fx to R arm Restrictions Weight Bearing Restrictions: No     Mobility  Bed Mobility Overal bed mobility: Needs Assistance Bed Mobility: Sit to Supine       Sit to supine: Contact guard assist   General bed mobility comments: increased time    Transfers Overall transfer level: Needs assistance Equipment used: Rolling walker (2 wheels) Transfers: Sit to/from Stand, Bed to  chair/wheelchair/BSC Sit to Stand: Mod assist     Squat pivot transfers: Contact guard assist     General transfer comment: STS from EOB and BSC with mod A for power up and cues for hand placement. Pt with slight posterior bias initially upon standing requiring assist for balance and improving for step pivot to/from [facility]    Ambulation/Gait               General Gait Details: Pt deferring due to fatigue      Balance Overall balance assessment: Needs assistance Sitting-balance support: No upper extremity supported, Feet supported Sitting balance-Leahy Scale: Fair Sitting balance - Comments: sitting EOB   Standing balance support: Bilateral upper extremity supported, During functional activity, Reliant on assistive device for balance Standing balance-Leahy Scale: Poor Standing balance comment: with RW support                            Cognition Arousal: Alert Behavior During Therapy: WFL for tasks assessed/performed Overall Cognitive Status: Impaired/Different from baseline                                 General Comments: Pt appears to have some increased confusion today, however is able to follow commands well. Pt's family member reports that her impaired memory and cognition and intermittent agitation are not her baseline        Exercises      General Comments        Pertinent Vitals/Pain Pain Assessment Pain Assessment: Faces Faces Pain Scale: Hurts little more  Pain Location: RUE Pain Descriptors / Indicators: Grimacing, Guarding Pain Intervention(s): Limited activity within patient's tolerance, Monitored during session, Repositioned     PT Goals (current goals can now be found in the care plan section) Progress towards PT goals: Progressing toward goals    Frequency    Min 1X/week      PT Plan         AM-PAC PT "6 Clicks" Mobility   Outcome Measure  Help needed turning from your back to your side while in a flat  bed without using bedrails?: A Little Help needed moving from lying on your back to sitting on the side of a flat bed without using bedrails?: A Little Help needed moving to and from a bed to a chair (including a wheelchair)?: A Little Help needed standing up from a chair using your arms (e.g., wheelchair or bedside chair)?: A Lot Help needed to walk in hospital room?: A Little Help needed climbing 3-5 steps with a railing? : Total 6 Click Score: 15    End of Session Equipment Utilized During Treatment: Gait belt Activity Tolerance: Patient limited by fatigue Patient left: in bed;with call bell/phone within reach;with family/visitor present;with bed alarm set Nurse Communication: Mobility status PT Visit Diagnosis: Unsteadiness on feet (R26.81);Other abnormalities of gait and mobility (R26.89);Muscle weakness (generalized) (M62.81)     Time: 9604-5409 PT Time Calculation (min) (ACUTE ONLY): 17 min  Charges:    $Therapeutic Activity: 8-22 mins PT General Charges $$ ACUTE PT VISIT: 1 Visit                     Johny Shock, PTA Acute Rehabilitation Services Secure Chat Preferred  [phone]    Johny Shock 03/24/2023, 3:39 PM

## 2023-03-24 NOTE — Progress Notes (Signed)
RN engage patient in conversation as Phlebotomist draws multiple labs.

## 2023-03-24 NOTE — Consult Note (Signed)
Referring Provider: Dr. Gery Pray Primary Care Physician:  Tally Joe, MD Primary Gastroenterologist:  Gentry Fitz   Reason for Consultation: Upper GI bleed, coffee-ground emesis  HPI: Jharline Legro is a 88 y.o. female with a past medical history of hypertension, CHF, paroxysmal atrial fibrillation and DVT (injury provoked) 2020. She was admitted to the hospital 03/13/2023 with right leg pain. Doppler study was negative for DVT but noted occlusion of the proximal to mid SFA.  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 She was in A-fib with RVR and she was started on Heparin IV.  Multilevel occlusive disease thought to be from cardioembolic event with poor outflow in the foot. S/P aortogram with initiation of thrombolysis therapy via catheter from the common femoral artery through the popliteal artery by vascular surgery 8/28.  She was eventually transitioned to Eliquis and aspirin.  She developed coffee-ground emesis and dark stools overnight. A GI consult was requested for further evaluation regarding suspected upper GI bleed.  GI was also assessed to have acute CHF exacerbation and received Lasix 40 mg IV and was placed on oxygen 2 L nasal cannula.  Chest x-ray showed a large hiatal hernia and small bilateral pleural effusions with bibasilar atelectasis.  She was started on Unasyn IV every 8 hours for possible pneumonia.  She denies having any nausea at this time.  No further hematemesis.  She endorses having LUQ pain.  She denies having any dysphagia or heartburn prior to this hospitalization.  No prior history of stomach ulcers or GI bleed.  Her bowel movements at home are green to brown in color.  It is unclear if she has  ever had a screening colonoscopy.  She is somewhat confused at this time and therefore obtaining a pertinent history is limited.  Labs 9/5 showed hemoglobin level 12.0.  WBC count 14.4.  CBC and BMP pending.  Lactic acid 2.3.   Echocardiogram 03/14/2023: Impressions: 1. Left ventricular ejection fraction, by estimation, is 40 to 45%. The  left ventricle has mildly decreased function. Left ventricular endocardial  border not optimally defined to evaluate regional wall motion. Left  ventricular diastolic function could  not be evaluated.   2. Right ventricular systolic function was not well visualized. The right  ventricular size is dilated.   3. A small pericardial effusion is present. The pericardial effusion is  surrounding the apex.   4. The mitral valve is grossly normal. Trivial mitral valve  regurgitation. No evidence of mitral stenosis.   5. The aortic valve was not well visualized. Aortic valve regurgitation  is not visualized.   6. Cannot exclude a small PFO.   7. Technically difficult study.    Past medical history: See HPI  Past Surgical History:  Procedure Laterality Date   ABDOMINAL AORTOGRAM W/LOWER EXTREMITY N/A 03/15/2023   Procedure: ABDOMINAL AORTOGRAM W/LOWER EXTREMITY;  Surgeon: Victorino Sparrow, MD;  Location: [facility] INVASIVE CV LAB;  Service: Cardiovascular;  Laterality: N/A;   ORIF FEMUR FRACTURE Right 03/08/2019   Procedure: OPEN REDUCTION INTERNAL FIXATION (ORIF) DISTAL FEMUR FRACTURE;  Surgeon: Kathryne Hitch, MD;  Location: [facility] ORS;  Service: Orthopedics;  Laterality: Right;   PERIPHERAL VASCULAR INTERVENTION Right 03/16/2023   Procedure: PERIPHERAL VASCULAR INTERVENTION;  Surgeon: Cephus Shelling, MD;  Location: [facility] INVASIVE CV LAB;  Service: Cardiovascular;  Laterality: Right;  SFA   PERIPHERAL VASCULAR THROMBECTOMY N/A 03/16/2023   Procedure: LYSIS RECHECK;  Surgeon: Cephus Shelling, MD;  Location: [facility] INVASIVE CV LAB;  Service: Cardiovascular;   Laterality: N/A;    Prior to Admission medications   Medication Sig Start Date End Date Taking? Authorizing Provider  carvedilol (COREG) 3.125 MG tablet Take 1 tablet (3.125 mg total) by mouth 2 (two) times daily. 03/12/19 04/11/19  Marzetta Board, MD    Current Facility-Administered Medications  Medication Dose Route Frequency Provider Last Rate Last Admin   0.9 %  sodium chloride infusion  250 mL Intravenous Continuous Cephus Shelling, MD 10 mL/hr at 03/18/23 1100 Infusion Verify at 03/18/23 1100   0.9 %  sodium chloride infusion   Intravenous Continuous Cephus Shelling, MD   Stopped at 03/20/23 0031   0.9 %  sodium chloride infusion  250 mL Intravenous PRN Cephus Shelling, MD       acetaminophen (TYLENOL) tablet 650 mg  650 mg Oral Q6H PRN Cephus Shelling, MD   650 mg at 03/23/23 1842   Or   acetaminophen (TYLENOL) suppository 650 mg  650 mg Rectal Q6H PRN Cephus Shelling, MD       acetaminophen (TYLENOL) tablet 650 mg  650 mg Oral Q4H PRN [place], Eric J, DO       albuterol (PROVENTIL) (2.5 MG/3ML) 0.083% nebulizer solution 2.5 mg  2.5 mg Nebulization Q6H PRN Cephus Shelling, MD   2.5 mg at 03/24/23 0353   Ampicillin-Sulbactam (UNASYN) 3 g in sodium chloride 0.9 % 100 mL IVPB  3 g Intravenous Q8H Stevphen Rochester, RPH 200 mL/hr at 03/24/23 0549 3 g at 03/24/23 0549   atorvastatin (LIPITOR) tablet 40 mg  40 mg Oral Daily [place], Eric J, DO   40 mg at 03/23/23 4098   Chlorhexidine Gluconate Cloth 2 % PADS 6 each  6 each Topical Daily Cephus Shelling, MD   6 each at 03/23/23 0819   feeding supplement (ENSURE ENLIVE / ENSURE PLUS) liquid 237 mL  237 mL Oral BID BM Cephus Shelling, MD   237 mL at 03/23/23 1644   guaiFENesin-dextromethorphan (ROBITUSSIN DM) 100-10 MG/5ML syrup 5 mL  5 mL Oral Q4H PRN Lorin Glass, MD   5 mL at 03/18/23 0413   hydrALAZINE (APRESOLINE) injection 5 mg  5 mg Intravenous Q20 Min PRN Cephus Shelling, MD        HYDROcodone-acetaminophen (NORCO/VICODIN) 5-325 MG per tablet 1 tablet  1 tablet Oral Q6H PRN Cephus Shelling, MD   1 tablet at 03/24/23 0055   menthol-cetylpyridinium (CEPACOL) lozenge 3 mg  1 lozenge Oral PRN [place], Eric J, DO   3 mg at 03/22/23 0850   metoprolol tartrate (LOPRESSOR) injection 5 mg  5 mg Intravenous Q4H PRN Crosley, Debby, MD       metoprolol tartrate (LOPRESSOR) injection 5 mg  5 mg Intravenous Once Crosley, Debby, MD       metoprolol tartrate (LOPRESSOR) tablet 50 mg  50 mg Oral BID Maisie Fus, MD   50 mg at 03/23/23 2147   ondansetron (ZOFRAN) injection 4 mg  4 mg Intravenous Q6H PRN Cephus Shelling, MD   4 mg at 03/22/23 0502   Oral care mouth rinse  15 mL Mouth Rinse PRN Lorin Glass, MD       pantoprazole (PROTONIX) injection 40 mg  40 mg  Intravenous Q12H Gery Pray, MD   40 mg at 03/24/23 0225   phenol (CHLORASEPTIC) mouth spray 1 spray  1 spray Mouth/Throat PRN Lorin Glass, MD   1 spray at 03/18/23 0201   pneumococcal 20-valent conjugate vaccine (PREVNAR 20) injection 0.5 mL  0.5 mL Intramuscular Tomorrow-1000 Cephus Shelling, MD       sodium chloride flush (NS) 0.9 % injection 3 mL  3 mL Intravenous Q12H Cephus Shelling, MD   3 mL at 03/23/23 2147   sodium chloride flush (NS) 0.9 % injection 3 mL  3 mL Intravenous PRN Cephus Shelling, MD   3 mL at 03/22/23 0329    Allergies as of 03/13/2023   (No Known Allergies)    History reviewed. No pertinent family history.  Social History   Socioeconomic History   Marital status: Single    Spouse name: Not on file   Number of children: Not on file   Years of education: Not on file   Highest education level: Not on file  Occupational History   Not on file  Tobacco Use   Smoking status: Never   Smokeless tobacco: Never  Substance and Sexual Activity   Alcohol use: Not on file   Drug use: Not on file   Sexual activity: Not on file  Other Topics Concern   Not on file   Social History Narrative   Not on file   Social Determinants of Health   Financial Resource Strain: Not on file  Food Insecurity: No Food Insecurity (03/13/2023)   Hunger Vital Sign    Worried About Running Out of Food in the Last Year: Never true    Ran Out of Food in the Last Year: Never true  Transportation Needs: No Transportation Needs (03/13/2023)   PRAPARE - Administrator, Civil Service (Medical): No    Lack of Transportation (Non-Medical): No  Physical Activity: Not on file  Stress: Not on file  Social Connections: Not on file  Intimate Partner Violence: Not At Risk (03/13/2023)   Humiliation, Afraid, Rape, and Kick questionnaire    Fear of Current or Ex-Partner: No    Emotionally Abused: No    Physically Abused: No    Sexually Abused: No    Review of Systems: Limited review of systems as patient is somewhat confused at this time  Physical Exam: Vital signs in last 24 hours: Temp:  [97.4 F (36.3 C)-97.8 F (36.6 C)] 97.6 F (36.4 C) (09/06 0636) Pulse Rate:  [92-116] 111 (09/06 0636) Resp:  [14-21] 21 (09/06 0408) BP: (130-148)/(65-111) 148/111 (09/05 2259) SpO2:  [94 %-100 %] 98 % (09/06 0636) Weight:  [64.3 kg] 64.3 kg (09/06 0511) Last BM Date : 03/23/23 General: Frail ill-appearing 88 year old female Head:  Normocephalic and atraumatic. Eyes:  No scleral icterus. Conjunctiva pink. Ears:  Normal auditory acuity. Nose:  No deformity, discharge or lesions. Mouth: Absent dentition.  No ulcers or lesions.  Neck:  Supple. No lymphadenopathy or thyromegaly.  Lungs: Breath sounds worse, with crackles in the bases bilaterally.  On oxygen 2 L nasal cannula. Heart: Regular rate and rhythm, no murmurs. Abdomen: Soft, nondistended.  LUQ tenderness without rebound or guarding.  Positive bowel sounds to all 4 quadrants. Rectal: Moderate amount of thick loose dark brown stool grossly heme positive, med tech at the bedside at time of exam. Musculoskeletal:   Symmetrical without gross deformities.  Pulses:  Normal pulses noted. Extremities:  Without clubbing or edema. Neurologic:  Alert  and  oriented x 4. No focal deficits.  Skin:  Intact without significant lesions or rashes. Psych:  Alert and cooperative. Normal mood and affect.  Intake/Output from previous day: 09/05 0701 - 09/06 0700 In: -  Out: 550 [Urine:550] Intake/Output this shift: No intake/output data recorded.  Lab Results: Recent Labs    03/22/23 0357 03/23/23 1025  WBC 13.6* 14.4*  HGB 15.3* 12.0  HCT 44.7 36.7  PLT 289 307   BMET Recent Labs    03/22/23 0843 03/23/23 1025  NA 135 133*  K 4.6 4.2  CL 102 101  CO2 20* 22  GLUCOSE 119* 109*  BUN 30* 31*  CREATININE 1.13* 1.03*  CALCIUM 8.5* 8.7*   LFT No results for input(s): "PROT", "ALBUMIN", "AST", "ALT", "ALKPHOS", "BILITOT", "BILIDIR", "IBILI" in the last 72 hours. PT/INR No results for input(s): "LABPROT", "INR" in the last 72 hours. Hepatitis Panel No results for input(s): "HEPBSAG", "HCVAB", "HEPAIGM", "HEPBIGM" in the last 72 hours.    Studies/Results: DG CHEST PORT 1 VIEW  Result Date: 03/24/2023 CLINICAL DATA:  Chest pain EXAM: PORTABLE CHEST 1 VIEW COMPARISON:  03/21/2023 FINDINGS: Large hiatal hernia in the left lower chest. Bibasilar airspace opacities, favor atelectasis. Small bilateral pole effusions. Heart is mildly enlarged. Aortic atherosclerosis. Mediastinal contours within the normal limits. No acute bony abnormality. IMPRESSION: Large hiatal hernia. Small bilateral pleural effusions, bibasilar atelectasis. Electronically Signed   By: Charlett Nose M.D.   On: 03/24/2023 02:08   VAS [place] DOP BILAT COMP TOS DIGITS REYNAUD  Result Date: 03/23/2023 UPPER EXTREMITY DOPPLER STUDY Patient Name:  MAILA THEBEAU  Date of Exam:   03/23/2023 Medical Rec #: [id number]   Accession #:    [id number] Date of Birth: 1934/07/19   Patient Gender: F Patient Age:   28 years Exam Location:  [facility]  Procedure:      VAS UE DOPPLER BILAT/COMP TOS, DIGITS (TO&UE REYNAUDS) Referring Phys: JENNIFER CHOI --------------------------------------------------------------------------------  Indications: Bilateral dusky/purple fingers. History:     PVD.  Risk Factors:  Hypertension, hyperlipidemia. Other Factors: History of DVT Performing Technologist: Marilynne Halsted RDMS, RVT  Examination Guidelines: A complete evaluation includes B-mode imaging, spectral Doppler, color Doppler, and power Doppler as needed of all accessible portions of each vessel. Bilateral testing is considered an integral part of a complete examination. Limited examinations for reoccurring indications may be performed as noted.  Right Doppler Findings: +-----------+--------+-----+-------------------------+-------------------------+ Site       PressureIndexDoppler                  Comments                  +-----------+--------+-----+-------------------------+-------------------------+ Subclavian              triphasic                                          +-----------+--------+-----+-------------------------+-------------------------+ Axillary                triphasic                                          +-----------+--------+-----+-------------------------+-------------------------+ Brachial   155          triphasic                                          +-----------+--------+-----+-------------------------+-------------------------+  Forearm                 triphasic                                          +-----------+--------+-----+-------------------------+-------------------------+ Radial                  triphasic                                          +-----------+--------+-----+-------------------------+-------------------------+ Ulnar                   triphasic                                          +-----------+--------+-----+-------------------------+-------------------------+ Palmar  Arch                                      Patent                    +-----------+--------+-----+-------------------------+-------------------------+ Digit                   Severely dampened        unable to obtain pressure                         waveforms                due to dampened waveforms +-----------+--------+-----+-------------------------+-------------------------+  Left Doppler Findings: +-----------+--------+-----+---------------------------+------------+ Site       PressureIndexDoppler                    Comments     +-----------+--------+-----+---------------------------+------------+ Subclavian              triphasic                               +-----------+--------+-----+---------------------------+------------+ Axillary                triphasic                               +-----------+--------+-----+---------------------------+------------+ Brachial   158          triphasic                               +-----------+--------+-----+---------------------------+------------+ Forearm                 triphasic                               +-----------+--------+-----+---------------------------+------------+ Radial                  triphasic                               +-----------+--------+-----+---------------------------+------------+ Ulnar  triphasic                               +-----------+--------+-----+---------------------------+------------+ Palmar Arch                                        Patent       +-----------+--------+-----+---------------------------+------------+ Digit      117     0.74 Severely dampened waveformsall 5 digits +-----------+--------+-----+---------------------------+------------+  Summary:  Right: No significant arterial obstruction detected in the right        upper extremity. Severely dampened waveforms in all 5 digits. Left: No significant arterial obstruction detected in  the left upper       extremity. Severely dampened waveforms in all 5 digits. *See table(s) above for measurements and observations.    Preliminary     IMPRESSION/PLAN:  88 year old female admitted to the hospital with acute limb ischemia in the right. Multilevel occlusive disease from cardioembolic event with poor outflow in the foot. S/P aortogram with initiation of thrombolysis therapy via catheter from the common femoral artery through the popliteal artery by vascular surgery 8/28.  Last dose of Eliquis and ASA was 9/5.  Upper GI bleed/coffee-ground emesis with dark stools started early a.m. today.  Dark brown loose stool grossly heme positive. Hg 12 on 9/5. Today's CBC pending. Last dose of Eliquis was 10 AM on 9/5.  -NPO -IV fluids per the hospitalist -Ondansetron 4 mg p.o. or IV every 6 hours as needed -Continue to hold Eliquis and ASA -Continue PPI IV twice daily -EGD when respiratory status stabilized -Await CBC/BMP results, specimens not yet collected -Transfuse for hemoglobin level less than 8 -Await further recommendations per Dr. Adela Lank  Atrial fibrillation  -Continue to hold Eliquis  Acute chronic HFrEF with  LV EF 40 - 45%.  Patient tachycardic with crackles in the lungs this morning placed on oxygen 2 L nasal cannula and received Lasix 40 mg IV.  Acute respiratory failure, acute CHF + possible pneumonia. Chest x-ray showed a large hiatal hernia and small bilateral pleural effusions with bibasilar atelectasis.  On Unasyn IV every 8 hours for possible pneumonia.  Leukocytosis. WBC 14.4. Lactic acid 2.3. Blood cultures in process. Chest xray showed small bilateral pleural effusions with bibasilar atelectasis.  Metabolic encephalopathy  Chest CT noted a 1.3 nodule in the anterior mediastinum starting for possible thymoma versus metastatic adenopathy   Arnaldo Natal  03/24/2023, 9:39AM

## 2023-03-24 NOTE — Progress Notes (Signed)
Patients Blood pressure in 180's, Tachy HR goes up tp 110-120's, PRN hydralazine given. MD notified to for IV pain meds since patient is NPO. Patient complaints of pain, arching her back, very restless, 12.5 mcg fentanyl given. Asked patient orientation questions, not a&o x4, slightly confused. Patient asking to stand up, RN explained that Vital signs and pain needed to be addressed and rechecked first. Upon assessment RLE (dorsalis pedis) is faint to absent, BLE upper extremities cold and dusky as well as BLE lower extremities. Attending MD checked patient as well as PA on call for Vascular.

## 2023-03-24 NOTE — Progress Notes (Signed)
Mobility Specialist Progress Note:   03/24/23 1553  Mobility  Activity Transferred from bed to chair  Level of Assistance Minimal assist, patient does 75% or more  Assistive Device Front wheel walker  Distance Ambulated (ft) 4 ft  Activity Response Tolerated well  Mobility Referral Yes  $Mobility charge 1 Mobility  Mobility Specialist Start Time (ACUTE ONLY) 1545  Mobility Specialist Stop Time (ACUTE ONLY) 1553  Mobility Specialist Time Calculation (min) (ACUTE ONLY) 8 min   Post Mobility: 101 HR   RN asking for assistance transferring pt to chair. Pt c/o slight pain in LLE when transitioning to EOB. MinA to stand and pivot. Pt left in chair with RN and family present in room.   Leory Plowman  Mobility Specialist Please contact via [employer] office at [phone]

## 2023-03-24 NOTE — Progress Notes (Signed)
Occupational Therapy Treatment Patient Details Name: Sarah Wolf MRN: [id number] DOB: [birth year] Today's Date: 03/24/2023   History of present illness Pt is 88 year old who presents to [facility] on 8/26 with complaint of bilateral lower extremity pain, greatest on right. Pt with critical limb ischemia RLE.  8/28 patient underwent thrombolysis of RLE. Worsening encephalopathy post procedure.Aspiration event leading to hypoxia and worsening AMS.  Intubated 8/28- 8/30 PMH - HTN, paroxysmal atrial fibrillation, history of DVT no longer on anticoagulation   OT comments  Pt asleep in recliner upon OT arrival. Pt easy to awaken and agreeable to skilled OT session. However, pt with obtunded arousal level throughout session with pt falling asleep after < 30 seconds of alertness with pt often falling asleep mid-movement or mid-sentence. Pt able to minimally participate in L UE AROM therapeutic exercises in recliner and reposition in recliner with Min assist. Per RN report pt with confusion and significant hypertension earlier this day. During this session, pt's VS were as follows: BP 130/93, HR 118, RR 16, O2 sat 100% on 3L continuous O2 through nasal cannula. Overall pt is making progress toward OT goals but was limited this session by fatigue, lethargy, and obtunded arousal level. Pt will benefit from continued acute skilled OT services to address deficits and increase safety and independence with functional tasks. Post acute discharge, pt will benefit from intensive inpatient skilled rehab services < 3 hours per day to maximize rehab potential.       If plan is discharge home, recommend the following:  A little help with walking and/or transfers;A lot of help with bathing/dressing/bathroom;Assistance with cooking/housework;Assist for transportation;Help with stairs or ramp for entrance   Equipment Recommendations  Other (comment) (defer)    Recommendations for Other Services      Precautions / Restrictions  Precautions Precautions: Fall Precaution Comments: Active humeral fx to R arm Restrictions Weight Bearing Restrictions: No       Mobility Bed Mobility               General bed mobility comments: Pt sitting in recliner at beginning and end of session    Transfers Overall transfer level: Needs assistance                 General transfer comment: Deferred this session secondary to low level of alertness     Balance Overall balance assessment: Needs assistance Sitting-balance support: No upper extremity supported, Feet supported Sitting balance-Leahy Scale: Fair Sitting balance - Comments: sitting in recliner                                   ADL either performed or assessed with clinical judgement   ADL Overall ADL's : Needs assistance/impaired                                       General ADL Comments: Pt with limited ability to participate in ADLs this session secondary to low level of alertness with pt also declining participation in toileting and grooming tasks this session.    Extremity/Trunk Assessment Upper Extremity Assessment Upper Extremity Assessment: Right hand dominant;Generalized weakness;RUE deficits/detail RUE Deficits / Details: Humeral fx being worked up for possible sx by PCP   Lower Extremity Assessment Lower Extremity Assessment: Defer to PT evaluation        Vision  Perception     Praxis      Cognition Arousal: Obtunded Behavior During Therapy: Flat affect Overall Cognitive Status: Difficult to assess                                          Exercises Exercises: General Upper Extremity General Exercises - Upper Extremity Shoulder Flexion: AROM, Strengthening, Left, 5 reps, Seated, Limitations Shoulder Flexion Limitations: Pt participation limited by level of alertness Shoulder Extension: AROM, Strengthening, Left, 5 reps, Seated, Limitations Shoulder Extension  Limitations: Pt participation limited by level of alertness Elbow Flexion: AROM, Strengthening, Left, 5 reps, Limitations Elbow Flexion Limitations: Pt participation limited by level of alertness Elbow Extension: AROM, Strengthening, Left, 5 reps, Seated, Limitations Elbow Extension Limitations: Pt participation limited by level of alertness    Shoulder Instructions       General Comments BP 130/93, HR 118, RR 16, O2 sat 100% on 3L continuous O2 through nasal cannula    Pertinent Vitals/ Pain       Pain Assessment Pain Assessment: Faces Faces Pain Scale: Hurts a little bit Pain Location: RUE Pain Descriptors / Indicators: Grimacing, Guarding Pain Intervention(s): Limited activity within patient's tolerance, Monitored during session, Repositioned  Home Living                                          Prior Functioning/Environment              Frequency  Min 1X/week        Progress Toward Goals  OT Goals(current goals can now be found in the care plan section)  Progress towards OT goals: Progressing toward goals  Acute Rehab OT Goals Patient Stated Goal: Pt did not state this session  Plan      Co-evaluation                 AM-PAC OT "6 Clicks" Daily Activity     Outcome Measure   Help from another person eating meals?: A Little Help from another person taking care of personal grooming?: A Little Help from another person toileting, which includes using toliet, bedpan, or urinal?: A Little Help from another person bathing (including washing, rinsing, drying)?: A Little Help from another person to put on and taking off regular upper body clothing?: A Lot Help from another person to put on and taking off regular lower body clothing?: A Little 6 Click Score: 17    End of Session    OT Visit Diagnosis: Muscle weakness (generalized) (M62.81);Other (comment) (Decreased activity tolerance)   Activity Tolerance Patient limited by  fatigue;Patient limited by lethargy;Other (comment) (Limited by level of alertness)   Patient Left in chair;with call bell/phone within reach;with chair alarm set   Nurse Communication Mobility status;Other (comment) (VS. Pt with low level alertness. Pt easy to awaken but with pt continually falling back to sleep after < 30 seconds of activity.)        Time: 1308-6578 OT Time Calculation (min): 10 min  Charges: OT General Charges $OT Visit: 1 Visit (No treatment charge added as pt with very limited participation this session due to low level of alertness.)  Lindley Stachnik "Orson Eva., OTR/L, MA Acute Rehab [phone]   Lendon Colonel 03/24/2023, 4:28 PM

## 2023-03-24 NOTE — Progress Notes (Addendum)
1:00AM Called by nursing, patient w/ N/V, coffee ground emesis, chest pain. Dark stool reported. HR between 100-115 Occult stool and H/H ordered. NPO. IV protonix Q12 ordered. Patient on Elquis and baby ASA - d/ced. Anticoagulants started this admission for  critical ischemic limb. Defer to AM team communication with Vascular Surgeon. Next dose Eliquis due at 10AM. Will place GI consult placed via [facility], Dr Rhea Belton.  Phenergan PRN added  Chest pains likely related to large Hiatal hernia. Maalox given.  EKG: A fib HR 101. Lopressor 5mg  IV x1 PRN. If needed after patients nausea and vomiting improved  3:52AM Patient more tachycardic 140's, tachypneic. Generalized crackles. Placed on 2L oxygen. ABG done - reassuring. BNP pending Patient is at risk for flash pulmonary edema, EF 40-45% mildly reduced Patient is at risk for aspiration pneumonia with nausea and vomiting 40 mg IV Lasix ordered Unasyn IV initiated

## 2023-03-24 NOTE — Progress Notes (Signed)
PROGRESS NOTE    Sarah Wolf  ZOX:[id number] DOB: 01-23-1935 DOA: 03/13/2023 PCP: Tally Joe, MD     Brief Narrative:  Sarah Wolf is a 88 y.o. female with past medical history significant for HTN, paroxysmal atrial fibrillation, history of DVT no longer on anticoagulation who presents to [facility] ED on 8/26 with complaint of bilateral lower extremity pain, greatest on right.  Onset morning of admission, pain all the way down her right leg severe nature.  Review of records note patient with left lower extremity DVT 2020 as well as echo noting A-fib at that time.  She was on Xarelto temporarily after being found to have a LLE DVT following a fall with femur fracture 03/08/2019.  At baseline lives alone, ambulatory with a cane.  Patient denied chest pain, no palpitations, no shortness of breath, no cough, no nausea/vomiting/diarrhea, no lower extremity edema.  And route with EMS patient was noted to be in A-fib with RVR.   In the emergency department patient was noted to be afebrile with pulse elevated up to 145 and atrial fibrillation, systolic blood pressures noted to be elevated into the 160s., and O2 saturations currently maintained on 1.5 L nasal cannula oxygen.  Labs noted sodium 133, potassium 3.5, chloride 93, TSH 2.94 , BNP 799.9, and high-sensitivity troponins 55->57.  Doppler ultrasounds of the lower extremity did not note any signs of a DVT, but did note occlusion of the proximal to mid SFA.  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  As a surgery have been formally consulted.  Patient had been started on heparin drip and given metoprolol IV intermittently.  Cardiology and vascular surgery were consulted.  TRH  consulted for admission for further evaluation and management of A-fib with RVR and acute occlusion of right SFA and left popliteal artery.   Significant Hospital events: 8/26 admitted to [facility] 8/28 thrombolysis of RLE with SFA stent, worsening encephalopathy and possible aspiration event leading to worsening hypoxia; pccm consulted and required intubation; Tx to ICU.  8/30: Extubated 9/1: transferred back to hospitalist service; pending SNF placement 9/6: coffee ground emesis, GI consulted   New events last 24 hours / Subjective: Patient with clinical decline.  Reported episode of coffee-ground emesis, melena.  Patient somewhat difficult to understand during examination this morning.  Trying to get out of bed.  She was tachycardic.  After morning rounds, I was notified by RN that patient complained of severe right lower extremity pain with diminished Doppler.  I reevaluated patient, nephew and grandniece are at bedside.  Patient is much more alert and appropriate during this examination.  Remains tachycardic.  Blood pressure is marginal with SBP 109.  Patient complaining of pain, given IV fentanyl without any improvement in pain.  Grandniece stated that patient did better with morphine 2 mg prn for pain.  We also discussed her GI bleeding.  Eliquis and aspirin are now on hold.  GI was consulted.  I voiced my concern regarding stability to undergo EGD today.  Also discussed with Dr. Adela Lank.  Assessment & Plan:   Principal Problem:   Critical limb ischemia of right lower extremity (HCC) Active Problems:   Atrial fibrillation with RVR (HCC)   Thrombus of right atrial appendage   Chronic HFpEF  Myocardial injury due to Afib   History of DVT (deep vein thrombosis)   Lesion of mediastinum   Hyponatremia   Acute respiratory failure with hypoxia (HCC)   Goals of care, counseling/discussion   Aspiration into airway   Upper GI bleed   Anticoagulated    Critical limb ischemia right lower  extremity likely secondary to cardioembolic event Pain of pain to lower legs, acutely.  CT angiogram notable for acute occlusion right SFA and left popliteal artery.  Vascular surgery was consulted and patient underwent right lower extremity angiogram/aortogram with peripheral thrombectomy with SFA stent.   -- Follow-up 4-6 weeks vascular surgery with RLE duplex/ABIs -- With right lower extremity pain today.  Reevaluated by vascular PA.  She had palpable femoral and Doppler DP signals in right foot, which is unchanged from postintervention -- Aspirin and Eliquis are on hold due to GI bleed  Upper GI bleed -- GI consulted, discussed with Dr. Adela Lank today -- FOBT+ -- Hold aspirin and Eliquis -- Trend H&H  -- Possible EGD planned for AM. CLD today and NPO at midnight  -- PPI IV   SIRS, ?aspiration again  -- Leukocytosis, lactic acidosis, afebrile -- Check UA, chest x-ray and blood cultures -- Empiric unasyn    Acute respiratory failure with hypoxia Suspect aspiration event Following aortogram with thrombectomy, patient has suspected aspiration event and developed acute respiratory failure with hypoxia.  Patient required intubation and transferred to the intensive care unit.  Patient was successfully extubated on 03/17/2023.  Tracheal aspirate with normal respiratory flora, MRSA PCR negative.  Completed 7-day course of antibiotics with Unasyn followed by ceftriaxone. -- Incentive spirometry -- Continue supplemental oxygen, maintain SpO2 > 90%   Acute encephalopathy: Resolved Etiology likely secondary to respiratory failure with hypoxia/hypercarbia after aspiration event as above.  CT head negative.  EEG with no seizure or epileptiform discharges noted.  Ammonia level within normal limits. -- Supportive care   Paroxysmal atrial fibrillation with RVR Previously noted to be in A-fib in 2020, but not on anticoagulation.  Given new embolic event, now will likely need to be on lifelong  anticoagulation.  Cardiology avoiding cardioversion in the setting of possible LAA appendage thrombus. -- Cardiology following, appreciate assistance -- Metoprolol tartrate 50 mg p.o. BID -- Eliquis 5 mg p.o. twice daily -- on hold today, see above  -- Continue to monitor on telemetry   Systolic congestive heart failure TTE with LVEF 40-45%, endocardial border not optimally defined to evaluate for regional wall motion abnormalities.  Seen by cardiology and now signed off. -- Metoprolol tartrate 50 mg p.o. BID   Hx DVT -- Eliquis 5 mg p.o. twice daily -- on hold today, see above    Type 2 diabetes mellitus Hemoglobin A1c 6.6.  Diet controlled.   Urinary retention Required Foley catheter, now resolved. -- Monitor urine output   Lesion of mediastinum Noted incidental finding on CT chest of nodule anterior mediastinum -- Outpatient follow-up with CT surgery   Weakness/debility/deconditioning: PT/OT recommending SNF placement. -- TOC consulted  CKD stage IIIa -- Stable  Large diaphragmatic hernia -- Seen on chest x-ray 9/3 -- Could consider outpatient general surgery consultation if surgical intervention is desired once her acute illness is stable  Poor perfusion of her upper extremities -- Her fingers bilaterally seem dusky on examination.  This has been ongoing since last week per niece.  Vascular duplex of upper extremities showing no significant arterial obstruction bilaterally.  Did have severely dampened waveforms in all 10 digits -- ?  Raynaud's. Left hand appearance improved.    DVT prophylaxis: Eliquis on hold  Code Status: DNR, no CPR if pulseless, DNI Family Communication: Nephew and grand niece at bedside Disposition Plan: SNF Status is: Inpatient Remains inpatient appropriate because: Not medically ready for discharge, GI bleed.  SNF search ongoing    Antimicrobials:  Anti-infectives (From admission, onward)    Start     Dose/Rate Route Frequency Ordered  Stop   03/24/23 0500  Ampicillin-Sulbactam (UNASYN) 3 g in sodium chloride 0.9 % 100 mL IVPB        3 g 200 mL/hr over 30 Minutes Intravenous Every 8 hours 03/24/23 0420     03/18/23 0830  cefTRIAXone (ROCEPHIN) 2 g in sodium chloride 0.9 % 100 mL IVPB        2 g 200 mL/hr over 30 Minutes Intravenous Every 24 hours 03/18/23 0731 03/21/23 0952   03/15/23 2330  Ampicillin-Sulbactam (UNASYN) 3 g in sodium chloride 0.9 % 100 mL IVPB  Status:  Discontinued        3 g 200 mL/hr over 30 Minutes Intravenous Every 6 hours 03/15/23 2239 03/18/23 0731        Objective: Vitals:   03/24/23 0927 03/24/23 0930 03/24/23 1005 03/24/23 1100  BP: (!) 171/125 (!) 187/157 (!) 149/127 102/82  Pulse: 76 (!) 104 (!) 46 77  Resp: 20 19 19  (!) 21  Temp: 98 F (36.7 C)     TempSrc: Oral   Oral  SpO2: 99% 97% 97% 97%  Weight:      Height:        Intake/Output Summary (Last 24 hours) at 03/24/2023 1220 Last data filed at 03/24/2023 0930 Gross per 24 hour  Intake --  Output 1350 ml  Net -1350 ml   Filed Weights   03/22/23 0432 03/23/23 0307 03/24/23 0511  Weight: 63.6 kg 64.1 kg 64.3 kg    Examination:  General exam: Appears calm, uncomfortable Respiratory system: Clear to auscultation. Respiratory effort normal. No respiratory distress. No conversational dyspnea.  Cardiovascular system: S1 & S2 heard, tachycardic, irregular rhythm. No murmurs. No pedal edema. Gastrointestinal system: Abdomen is nondistended, soft and nontender. Central nervous system: Alert  Skin: + Bilateral fingers dusky in appearance, blanchable      Data Reviewed: I have personally reviewed following labs and imaging studies  CBC: Recent Labs  Lab 03/20/23 0402 03/21/23 0312 03/22/23 0357 03/23/23 1025 03/24/23 1035  WBC 11.1* 12.1* 13.6* 14.4* 18.3*  HGB 12.0 11.8* 15.3* 12.0 13.4  HCT 36.8 36.9 44.7 36.7 40.0  MCV 97.6 101.7* 95.5 95.6 96.2  PLT 218 203 289 307 238   Basic Metabolic Panel: Recent Labs  Lab  03/18/23 0209 03/20/23 0402 03/21/23 0312 03/22/23 0843 03/23/23 1025 03/24/23 1035  NA 137 134* 132* 135 133* 139  K 4.3 5.0 4.7 4.6 4.2 4.1  CL 105 99 103 102 101 96*  CO2 22 24 18* 20* 22 25  GLUCOSE 111* 126* 113* 119* 109* 108*  BUN 30* 18 20 30* 31* 26*  CREATININE 0.96 0.69 0.73 1.13* 1.03* 0.94  CALCIUM 8.7* 9.0 8.4* 8.5* 8.7* 9.0  MG 2.3 2.1  --   --  2.1  --   PHOS 2.1* 2.8  --   --  3.3  --    GFR: Estimated Creatinine Clearance: 37.8 mL/min (by C-G formula based on SCr of 0.94 mg/dL). Liver Function Tests: Recent Labs  Lab 03/18/23 0209  AST 32  ALT 24  ALKPHOS 39  BILITOT 1.2  PROT 5.4*  ALBUMIN 2.5*   No results for input(s): "LIPASE", "AMYLASE" in the last 168 hours. Recent Labs  Lab 03/17/23 1637  AMMONIA 25   Coagulation Profile: No results for input(s): "INR", "PROTIME" in the last 168 hours. Cardiac Enzymes: No results for input(s): "CKTOTAL", "CKMB", "CKMBINDEX", "TROPONINI" in the last 168 hours. BNP (last 3 results) No results for input(s): "PROBNP" in the last 8760 hours. HbA1C: No results for input(s): "HGBA1C" in the last 72 hours. CBG: Recent Labs  Lab 03/17/23 2046 03/18/23 0002 03/18/23 0327 03/18/23 0809 03/18/23 1227  GLUCAP 109* 128* 108* 109* 116*   Lipid Profile: No results for input(s): "CHOL", "HDL", "LDLCALC", "TRIG", "CHOLHDL", "LDLDIRECT" in the last 72 hours. Thyroid Function Tests: No results for input(s): "TSH", "T4TOTAL", "FREET4", "T3FREE", "THYROIDAB" in the last 72 hours. Anemia Panel: No results for input(s): "VITAMINB12", "FOLATE", "FERRITIN", "TIBC", "IRON", "RETICCTPCT" in the last 72 hours. Sepsis Labs: Recent Labs  Lab 03/21/23 1053 03/24/23 0457 03/24/23 1106  PROCALCITON <0.10  --   --   LATICACIDVEN  --  2.3* 2.9*    Recent Results (from the past 240 hour(s))  SARS Coronavirus 2 by RT PCR (hospital order, performed in [facility] hospital lab) *cepheid single result test* Anterior Nasal Swab      Status: None   Collection Time: 03/14/23  5:40 PM   Specimen: Anterior Nasal Swab  Result Value Ref Range Status   SARS Coronavirus 2 by RT PCR NEGATIVE NEGATIVE Final    Comment: Performed at [facility], [address] [place], [place] [address]  MRSA Next Gen by PCR, Nasal     Status: None   Collection Time: 03/16/23  5:09 AM   Specimen: Nasal Mucosa; Nasal Swab  Result Value Ref Range Status   MRSA by PCR Next Gen NOT DETECTED NOT DETECTED Final    Comment: (NOTE) The GeneXpert MRSA Assay (FDA approved for NASAL specimens only), is one component of a comprehensive MRSA colonization surveillance program. It is not intended to diagnose MRSA infection nor to guide or monitor treatment for MRSA infections. Test performance is not FDA approved in patients less than 53 years old. Performed at [facility], [address], [place], [place] [address]   Culture, Respiratory w Gram Stain     Status: None   Collection Time: 03/16/23  5:09 AM   Specimen: Tracheal Aspirate; Respiratory  Result Value Ref Range Status   Specimen Description TRACHEAL ASPIRATE  Final   Special Requests NONE  Final   Gram Stain   Final    MODERATE WBC PRESENT,BOTH PMN AND MONONUCLEAR FEW GRAM POSITIVE COCCI IN PAIRS AND CHAINS    Culture   Final    Normal respiratory flora-no Staph aureus or Pseudomonas seen Performed at [facility], [address] [place], [place] [address]    Report Status 03/19/2023 FINAL  Final  Urine Culture     Status: Abnormal   Collection Time: 03/20/23  4:32 AM   Specimen: Urine, Clean Catch  Result Value Ref Range Status   Specimen Description URINE, CLEAN CATCH  Final   Special Requests NONE  Final   Culture (A)  Final    <10,000 COLONIES/mL INSIGNIFICANT GROWTH Performed at [facility], [address] [place], [place] [address]    Report Status 03/21/2023 FINAL  Final      Radiology Studies: VAS [place] DOP BILAT COMP TOS DIGITS  REYNAUD  Result Date:  03/24/2023 UPPER EXTREMITY DOPPLER STUDY Patient Name:  SANYLA LAMBERTY  Date of Exam:   03/23/2023 Medical Rec #: [id number]   Accession #:    [id number] Date of Birth: January 28, 1935   Patient Gender: F Patient Age:   3 years Exam Location:  [facility] Procedure:      VAS UE DOPPLER BILAT/COMP TOS, DIGITS (TO&UE REYNAUDS) Referring Phys: Brandolyn Shortridge --------------------------------------------------------------------------------  Indications: Bilateral dusky/purple fingers. History:     PVD.  Risk Factors:  Hypertension, hyperlipidemia. Other Factors: History of DVT Performing Technologist: Marilynne Halsted RDMS, RVT  Examination Guidelines: A complete evaluation includes B-mode imaging, spectral Doppler, color Doppler, and power Doppler as needed of all accessible portions of each vessel. Bilateral testing is considered an integral part of a complete examination. Limited examinations for reoccurring indications may be performed as noted.  Right Doppler Findings: +-----------+--------+-----+-------------------------+-------------------------+ Site       PressureIndexDoppler                  Comments                  +-----------+--------+-----+-------------------------+-------------------------+ Subclavian              triphasic                                          +-----------+--------+-----+-------------------------+-------------------------+ Axillary                triphasic                                          +-----------+--------+-----+-------------------------+-------------------------+ Brachial   155          triphasic                                          +-----------+--------+-----+-------------------------+-------------------------+ Forearm                 triphasic                                          +-----------+--------+-----+-------------------------+-------------------------+ Radial                  triphasic                                           +-----------+--------+-----+-------------------------+-------------------------+ Ulnar                   triphasic                                          +-----------+--------+-----+-------------------------+-------------------------+ Palmar Arch                                      Patent                    +-----------+--------+-----+-------------------------+-------------------------+  Digit                   Severely dampened        unable to obtain pressure                         waveforms                due to dampened waveforms +-----------+--------+-----+-------------------------+-------------------------+  Left Doppler Findings: +-----------+--------+-----+---------------------------+------------+ Site       PressureIndexDoppler                    Comments     +-----------+--------+-----+---------------------------+------------+ Subclavian              triphasic                               +-----------+--------+-----+---------------------------+------------+ Axillary                triphasic                               +-----------+--------+-----+---------------------------+------------+ Brachial   158          triphasic                               +-----------+--------+-----+---------------------------+------------+ Forearm                 triphasic                               +-----------+--------+-----+---------------------------+------------+ Radial                  triphasic                               +-----------+--------+-----+---------------------------+------------+ Ulnar                   triphasic                               +-----------+--------+-----+---------------------------+------------+ Palmar Arch                                        Patent       +-----------+--------+-----+---------------------------+------------+ Digit      117     0.74 Severely dampened waveformsall 5  digits +-----------+--------+-----+---------------------------+------------+  Summary:  Right: No significant arterial obstruction detected in the right        upper extremity. Severely dampened waveforms in all 5 digits. Left: No significant arterial obstruction detected in the left upper       extremity. Severely dampened waveforms in all 5 digits. *See table(s) above for measurements and observations. Electronically signed by Gerarda Fraction on 03/24/2023 at 8:45:40 AM.    Final    DG CHEST PORT 1 VIEW  Result Date: 03/24/2023 CLINICAL DATA:  Chest pain EXAM: PORTABLE CHEST 1 VIEW COMPARISON:  03/21/2023 FINDINGS: Large hiatal hernia in the left lower chest. Bibasilar airspace opacities, favor atelectasis. Small bilateral pole effusions. Heart is mildly enlarged. Aortic atherosclerosis. Mediastinal contours within the normal limits.  No acute bony abnormality. IMPRESSION: Large hiatal hernia. Small bilateral pleural effusions, bibasilar atelectasis. Electronically Signed   By: Charlett Nose M.D.   On: 03/24/2023 02:08      Scheduled Meds:  atorvastatin  40 mg Oral Daily   Chlorhexidine Gluconate Cloth  6 each Topical Daily   feeding supplement  237 mL Oral BID BM   metoprolol tartrate  5 mg Intravenous Once   metoprolol tartrate  50 mg Oral BID   pantoprazole (PROTONIX) IV  40 mg Intravenous Q12H   pneumococcal 20-valent conjugate vaccine  0.5 mL Intramuscular Tomorrow-1000   sodium chloride flush  3 mL Intravenous Q12H   Continuous Infusions:  sodium chloride 10 mL/hr at 03/18/23 1100   sodium chloride Stopped (03/20/23 0031)   sodium chloride     ampicillin-sulbactam (UNASYN) IV 3 g (03/24/23 0549)     LOS: 11 days   Time spent: 50 minutes   Noralee Stain, DO [facility] 03/24/2023, 12:20 PM   Available via Epic secure chat 7am-7pm After these hours, please refer to coverage provider listed on [email or web address]

## 2023-03-24 NOTE — Progress Notes (Signed)
Mobility Specialist Progress Note:   03/24/23 1400  Oxygen Therapy  O2 Device Nasal Cannula  Mobility  Activity Transferred from chair to bed  Level of Assistance Minimal assist, patient does 75% or more  Assistive Device Front wheel walker  Distance Ambulated (ft) 4 ft  Activity Response Tolerated well  Mobility Referral Yes  $Mobility charge 1 Mobility  Mobility Specialist Start Time (ACUTE ONLY) 1435  Mobility Specialist Stop Time (ACUTE ONLY) 1447  Mobility Specialist Time Calculation (min) (ACUTE ONLY) 12 min   Pt received trying to get back to bed with chair alarm ringing. Assisted back to bed with MinA + RW. No complaints throughout. Pt left in bed with call bell and bed alarm on. RN present.  D'Vante Earlene Plater Mobility Specialist Please contact via Special educational needs teacher or Rehab office at [phone]

## 2023-03-25 DIAGNOSIS — K922 Gastrointestinal hemorrhage, unspecified: Secondary | ICD-10-CM | POA: Diagnosis not present

## 2023-03-25 DIAGNOSIS — Z7901 Long term (current) use of anticoagulants: Secondary | ICD-10-CM | POA: Diagnosis not present

## 2023-03-25 DIAGNOSIS — I4891 Unspecified atrial fibrillation: Secondary | ICD-10-CM | POA: Diagnosis not present

## 2023-03-25 DIAGNOSIS — I70221 Atherosclerosis of native arteries of extremities with rest pain, right leg: Secondary | ICD-10-CM | POA: Diagnosis not present

## 2023-03-25 LAB — URINALYSIS, ROUTINE W REFLEX MICROSCOPIC
Bilirubin Urine: NEGATIVE
Glucose, UA: NEGATIVE mg/dL
Ketones, ur: NEGATIVE mg/dL
Leukocytes,Ua: NEGATIVE
Nitrite: NEGATIVE
Protein, ur: NEGATIVE mg/dL
Specific Gravity, Urine: 1.009 (ref 1.005–1.030)
pH: 5 (ref 5.0–8.0)

## 2023-03-25 LAB — BASIC METABOLIC PANEL
Anion gap: 12 (ref 5–15)
BUN: 18 mg/dL (ref 8–23)
CO2: 27 mmol/L (ref 22–32)
Calcium: 8.2 mg/dL — ABNORMAL LOW (ref 8.9–10.3)
Chloride: 96 mmol/L — ABNORMAL LOW (ref 98–111)
Creatinine, Ser: 0.75 mg/dL (ref 0.44–1.00)
GFR, Estimated: >60 mL/min (ref >60)
Glucose, Bld: 103 mg/dL — ABNORMAL HIGH (ref 70–99)
Potassium: 4.2 mmol/L (ref 3.5–5.1)
Sodium: 135 mmol/L (ref 135–145)

## 2023-03-25 LAB — CBC
HCT: 35 % — ABNORMAL LOW (ref 36.0–46.0)
Hemoglobin: 11.4 g/dL — ABNORMAL LOW (ref 12.0–15.0)
MCH: 31.2 pg (ref 26.0–34.0)
MCHC: 32.6 g/dL (ref 30.0–36.0)
MCV: 95.9 fL (ref 80.0–100.0)
Platelets: 300 10*3/uL (ref 150–400)
RBC: 3.65 MIL/uL — ABNORMAL LOW (ref 3.87–5.11)
RDW: 15.4 % (ref 11.5–15.5)
WBC: 24.6 10*3/uL — ABNORMAL HIGH (ref 4.0–10.5)
nRBC: 0.1 % (ref 0.0–0.2)

## 2023-03-25 LAB — HEMOGLOBIN AND HEMATOCRIT, BLOOD
HCT: 34.4 % — ABNORMAL LOW (ref 36.0–46.0)
HCT: 33.4 % — ABNORMAL LOW (ref 36.0–46.0)
Hemoglobin: 11 g/dL — ABNORMAL LOW (ref 12.0–15.0)
Hemoglobin: 10.9 g/dL — ABNORMAL LOW (ref 12.0–15.0)

## 2023-03-25 MED ORDER — SODIUM CHLORIDE 0.9 % IV SOLN
2.0000 g | Freq: Two times a day (BID) | INTRAVENOUS | Status: DC
  Administered 2023-03-25 – 2023-03-29 (×10): 2 g via INTRAVENOUS
  Filled 2023-03-25 (×10): qty 12.5

## 2023-03-25 MED ORDER — METOPROLOL TARTRATE 100 MG PO TABS
100.0000 mg | ORAL_TABLET | Freq: Two times a day (BID) | ORAL | Status: DC
  Administered 2023-03-25 – 2023-03-30 (×11): 100 mg via ORAL
  Filled 2023-03-25 (×11): qty 1

## 2023-03-25 NOTE — Progress Notes (Signed)
Pharmacy Antibiotic Note  Sarah Wolf is a 88 y.o. female admitted on 03/13/2023.  She had worsening respiratory status overnight and pharmacy has been consulted for change from unasyn to cefepime dosing for aspiration / HCAP pneumonia. WBC 24 elevated, afebrile, crcl approx 82ml/min  Plan: Cefepime 2gm IV q12h  Height: 5\' 2"  (157.5 cm) Weight: 64.4 kg (141 lb 15.6 oz) IBW/kg (Calculated) : 50.1  Temp (24hrs), Avg:97.8 F (36.6 C), Min:97.4 F (36.3 C), Max:98.2 F (36.8 C)  Recent Labs  Lab 03/21/23 0312 03/22/23 0357 03/22/23 0843 03/23/23 1025 03/24/23 0457 03/24/23 1035 03/24/23 1106 03/25/23 0717  WBC 12.1* 13.6*  --  14.4*  --  18.3*  --  24.6*  CREATININE 0.73  --  1.13* 1.03*  --  0.94  --  0.75  LATICACIDVEN  --   --   --   --  2.3*  --  2.9*  --     Estimated Creatinine Clearance: 44.5 mL/min (by C-G formula based on SCr of 0.75 mg/dL).    No Known Allergies   [name] Pharm.D. CPP, BCPS Clinical Pharmacist [phone] 03/25/2023 9:39 AM

## 2023-03-25 NOTE — Progress Notes (Addendum)
PROGRESS NOTE    Olicia Guglielmetti  GNF:[id number] DOB: 12-06-1934 DOA: 03/13/2023 PCP: Tally Joe, MD     Brief Narrative:  Rotem Lijewski is a 88 y.o. female with past medical history significant for HTN, paroxysmal atrial fibrillation, history of DVT no longer on anticoagulation who presents to [facility] ED on 8/26 with complaint of bilateral lower extremity pain, greatest on right.  Onset morning of admission, pain all the way down her right leg severe nature.  Review of records note patient with left lower extremity DVT 2020 as well as echo noting A-fib at that time.  She was on Xarelto temporarily after being found to have a LLE DVT following a fall with femur fracture 03/08/2019.  At baseline lives alone, ambulatory with a cane.  Patient denied chest pain, no palpitations, no shortness of breath, no cough, no nausea/vomiting/diarrhea, no lower extremity edema.  And route with EMS patient was noted to be in A-fib with RVR.   In the emergency department patient was noted to be afebrile with pulse elevated up to 145 and atrial fibrillation, systolic blood pressures noted to be elevated into the 160s., and O2 saturations currently maintained on 1.5 L nasal cannula oxygen.  Labs noted sodium 133, potassium 3.5, chloride 93, TSH 2.94 , BNP 799.9, and high-sensitivity troponins 55->57.  Doppler ultrasounds of the lower extremity did not note any signs of a DVT, but did note occlusion of the proximal to mid SFA.  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  As a surgery have been formally consulted.  Patient had been started on heparin drip and given metoprolol IV intermittently.  Cardiology and vascular surgery were consulted.  TRH  consulted for admission for further evaluation and management of A-fib with RVR and acute occlusion of right SFA and left popliteal artery.   Significant Hospital events: 8/26 admitted to [facility] 8/28 thrombolysis of RLE with SFA stent, worsening encephalopathy and possible aspiration event leading to worsening hypoxia; pccm consulted and required intubation; Tx to ICU.  8/30: Extubated 9/1: transferred back to hospitalist service; pending SNF placement 9/6: coffee ground emesis, GI consulted  9/7: EGD deferred, dx PNA and started on cefepime   New events last 24 hours / Subjective: Patient reports that she is feeling great, this is a pleasant she has felt this hospital stay.  Denies any pain today.  Doing great with morphine yesterday.  However, she has developed a productive cough of yellow sputum.  WBC climbing.  Remains afebrile.  Heart rate is 120-130 during my exam.  Assessment & Plan:   Principal Problem:   Critical limb ischemia of right lower extremity (HCC) Active Problems:   Atrial fibrillation with RVR (HCC)   Thrombus of right atrial appendage   Chronic HFpEF   Myocardial injury due to Afib   History of DVT (deep vein thrombosis)   Lesion of mediastinum   Hyponatremia   Acute respiratory failure with hypoxia (HCC)   Goals of care, counseling/discussion   Aspiration into airway   Upper GI bleed   Anticoagulated    Critical limb ischemia right lower extremity likely secondary to cardioembolic event Pain of pain to lower legs, acutely.  CT angiogram notable for acute occlusion right SFA and left popliteal artery.  Vascular surgery was consulted and patient underwent right lower extremity angiogram/aortogram with peripheral thrombectomy with SFA stent.   -- Follow-up 4-6 weeks vascular surgery with RLE duplex/ABIs -- Aspirin and Eliquis are on hold due to GI bleed  Upper GI bleed -- FOBT+ -- Hold aspirin and Eliquis -- Trend H&H  -- Possible EGD planned for 9/8. CLD  today and NPO at midnight  -- PPI IV  -- Discussed with Dr. Adela Lank today  Sepsis, not POA, secondary to HCAP -- Worsening WBC with productive cough -- Blood cx pending -- UA negative  -- Cefepime started 9/7    Acute respiratory failure with hypoxia Suspect aspiration event Following aortogram with thrombectomy, patient has suspected aspiration event and developed acute respiratory failure with hypoxia.  Patient required intubation and transferred to the intensive care unit.  Patient was successfully extubated on 03/17/2023.  Tracheal aspirate with normal respiratory flora, MRSA PCR negative.  Completed 7-day course of antibiotics with Unasyn followed by ceftriaxone. -- Incentive spirometry -- Continue supplemental oxygen, maintain SpO2 > 90%   Acute encephalopathy: Resolved Etiology likely secondary to respiratory failure with hypoxia/hypercarbia after aspiration event as above.  CT head negative.  EEG with no seizure or epileptiform discharges noted.  Ammonia level within normal limits. -- Supportive care   Paroxysmal atrial fibrillation with RVR Previously noted to be in A-fib in 2020, but not on anticoagulation.  Given new embolic event, now will likely need to be on lifelong anticoagulation.  Cardiology avoiding cardioversion in the setting of possible LAA appendage thrombus. -- Cardiology following, appreciate assistance -- Metoprolol -increased dose today -- Eliquis 5 mg p.o. twice daily -- on hold today, see above  -- Continue to monitor on telemetry   Chronic systolic congestive heart failure TTE with LVEF 40-45%, endocardial border not optimally defined to evaluate for regional wall motion abnormalities.  Seen by cardiology and now signed off. -- Metoprolol -increased dose today   Hx DVT -- Eliquis 5 mg p.o. twice daily -- on hold today, see above    Type 2 diabetes mellitus Hemoglobin A1c 6.6.  Diet controlled.   Urinary retention Required Foley catheter, now  resolved. -- Monitor urine output   Lesion of mediastinum Noted incidental finding on CT chest of nodule anterior mediastinum -- Outpatient follow-up with CT surgery   Weakness/debility/deconditioning: PT/OT recommending SNF placement. -- TOC consulted  CKD stage IIIa -- Stable  Large diaphragmatic hernia -- Seen on chest x-ray 9/3 -- Could consider outpatient general surgery consultation if surgical intervention is desired once her acute illness is stable  Poor perfusion of her upper extremities -- Her fingers bilaterally seem dusky on examination.  This has been ongoing since last week per niece.  Vascular duplex of upper extremities showing no significant arterial obstruction bilaterally.  Did have severely dampened waveforms in all 10 digits -- ?Raynaud's. Left hand appearance improved.    DVT prophylaxis: Eliquis on hold  Code Status: DNR, no CPR if pulseless, DNI Family Communication: niece over the phone  Disposition Plan: SNF Status is: Inpatient Remains inpatient appropriate because: Not medically ready for discharge, GI bleed.  SNF search ongoing. DC > 2 days     Antimicrobials:  Anti-infectives (From admission, onward)    Start     Dose/Rate Route Frequency Ordered Stop   03/25/23 0945  ceFEPIme (MAXIPIME) 2 g in sodium chloride 0.9 % 100 mL IVPB        2 g 200 mL/hr over 30 Minutes Intravenous Every 12 hours  03/25/23 0853     03/24/23 0500  Ampicillin-Sulbactam (UNASYN) 3 g in sodium chloride 0.9 % 100 mL IVPB  Status:  Discontinued        3 g 200 mL/hr over 30 Minutes Intravenous Every 8 hours 03/24/23 0420 03/25/23 0853   03/18/23 0830  cefTRIAXone (ROCEPHIN) 2 g in sodium chloride 0.9 % 100 mL IVPB        2 g 200 mL/hr over 30 Minutes Intravenous Every 24 hours 03/18/23 0731 03/21/23 0952   03/15/23 2330  Ampicillin-Sulbactam (UNASYN) 3 g in sodium chloride 0.9 % 100 mL IVPB  Status:  Discontinued        3 g 200 mL/hr over 30 Minutes Intravenous Every 6  hours 03/15/23 2239 03/18/23 0731        Objective: Vitals:   03/24/23 2330 03/25/23 0404 03/25/23 0529 03/25/23 0828  BP: 112/73 (!) 108/57  (!) 137/92  Pulse: (!) 104 (!) 102  (!) 113  Resp: 17 20  17   Temp: 98.2 F (36.8 C) 97.8 F (36.6 C)  97.8 F (36.6 C)  TempSrc: Axillary Oral  Oral  SpO2: 100% 97%  100%  Weight:   64.4 kg   Height:        Intake/Output Summary (Last 24 hours) at 03/25/2023 1509 Last data filed at 03/25/2023 0225 Gross per 24 hour  Intake 120 ml  Output 1120 ml  Net -1000 ml   Filed Weights   03/23/23 0307 03/24/23 0511 03/25/23 0529  Weight: 64.1 kg 64.3 kg 64.4 kg   Examination: General exam: Appears calm and comfortable  Respiratory system: RLE crackles, no respiratory distress, on [place] O2  Cardiovascular system: S1 & S2 heard, tachy and irreg rhythm, rate 120-130s. No pedal edema. Gastrointestinal system: Abdomen is nondistended, soft and nontender. Normal bowel sounds heard. Central nervous system: Alert and oriented. Non focal exam. Speech clear  Extremities: Symmetric in appearance bilaterally Skin: No rashes, lesions or ulcers on exposed skin , left groin without erythema or swelling  Psychiatry: Judgement and insight appear stable. Mood & affect appropriate.     Data Reviewed: I have personally reviewed following labs and imaging studies  CBC: Recent Labs  Lab 03/21/23 0312 03/22/23 0357 03/23/23 1025 03/24/23 1035 03/24/23 1326 03/24/23 1851 03/25/23 0052 03/25/23 0717 03/25/23 1219  WBC 12.1* 13.6* 14.4* 18.3*  --   --   --  24.6*  --   HGB 11.8* 15.3* 12.0 13.4 12.3 12.4 11.0* 11.4* 10.9*  HCT 36.9 44.7 36.7 40.0 37.7 38.6 34.4* 35.0* 33.4*  MCV 101.7* 95.5 95.6 96.2  --   --   --  95.9  --   PLT 203 289 307 238  --   --   --  300  --    Basic Metabolic Panel: Recent Labs  Lab 03/20/23 0402 03/21/23 0312 03/22/23 0843 03/23/23 1025 03/24/23 1035 03/25/23 0717  NA 134* 132* 135 133* 139 135  K 5.0 4.7 4.6 4.2 4.1  4.2  CL 99 103 102 101 96* 96*  CO2 24 18* 20* 22 25 27   GLUCOSE 126* 113* 119* 109* 108* 103*  BUN 18 20 30* 31* 26* 18  CREATININE 0.69 0.73 1.13* 1.03* 0.94 0.75  CALCIUM 9.0 8.4* 8.5* 8.7* 9.0 8.2*  MG 2.1  --   --  2.1  --   --   PHOS 2.8  --   --  3.3  --   --    GFR: Estimated Creatinine Clearance: 44.5  mL/min (by C-G formula based on SCr of 0.75 mg/dL). Liver Function Tests: No results for input(s): "AST", "ALT", "ALKPHOS", "BILITOT", "PROT", "ALBUMIN" in the last 168 hours.  No results for input(s): "LIPASE", "AMYLASE" in the last 168 hours. No results for input(s): "AMMONIA" in the last 168 hours.  Coagulation Profile: No results for input(s): "INR", "PROTIME" in the last 168 hours. Cardiac Enzymes: No results for input(s): "CKTOTAL", "CKMB", "CKMBINDEX", "TROPONINI" in the last 168 hours. BNP (last 3 results) No results for input(s): "PROBNP" in the last 8760 hours. HbA1C: No results for input(s): "HGBA1C" in the last 72 hours. CBG: No results for input(s): "GLUCAP" in the last 168 hours.  Lipid Profile: No results for input(s): "CHOL", "HDL", "LDLCALC", "TRIG", "CHOLHDL", "LDLDIRECT" in the last 72 hours. Thyroid Function Tests: No results for input(s): "TSH", "T4TOTAL", "FREET4", "T3FREE", "THYROIDAB" in the last 72 hours. Anemia Panel: No results for input(s): "VITAMINB12", "FOLATE", "FERRITIN", "TIBC", "IRON", "RETICCTPCT" in the last 72 hours. Sepsis Labs: Recent Labs  Lab 03/21/23 1053 03/24/23 0457 03/24/23 1106  PROCALCITON <0.10  --   --   LATICACIDVEN  --  2.3* 2.9*    Recent Results (from the past 240 hour(s))  MRSA Next Gen by PCR, Nasal     Status: None   Collection Time: 03/16/23  5:09 AM   Specimen: Nasal Mucosa; Nasal Swab  Result Value Ref Range Status   MRSA by PCR Next Gen NOT DETECTED NOT DETECTED Final    Comment: (NOTE) The GeneXpert MRSA Assay (FDA approved for NASAL specimens only), is one component of a comprehensive MRSA  colonization surveillance program. It is not intended to diagnose MRSA infection nor to guide or monitor treatment for MRSA infections. Test performance is not FDA approved in patients less than 27 years old. Performed at [facility], [address], [place], [place] [address]   Culture, Respiratory w Gram Stain     Status: None   Collection Time: 03/16/23  5:09 AM   Specimen: Tracheal Aspirate; Respiratory  Result Value Ref Range Status   Specimen Description TRACHEAL ASPIRATE  Final   Special Requests NONE  Final   Gram Stain   Final    MODERATE WBC PRESENT,BOTH PMN AND MONONUCLEAR FEW GRAM POSITIVE COCCI IN PAIRS AND CHAINS    Culture   Final    Normal respiratory flora-no Staph aureus or Pseudomonas seen Performed at [facility], [address], [place], [place] [address]    Report Status 03/19/2023 FINAL  Final  Urine Culture     Status: Abnormal   Collection Time: 03/20/23  4:32 AM   Specimen: Urine, Clean Catch  Result Value Ref Range Status   Specimen Description URINE, CLEAN CATCH  Final   Special Requests NONE  Final   Culture (A)  Final    <10,000 COLONIES/mL INSIGNIFICANT GROWTH Performed at [facility], [address] [place], [place] [address]    Report Status 03/21/2023 FINAL  Final  Culture, blood (Routine X 2) w Reflex to ID Panel     Status: None (Preliminary result)   Collection Time: 03/24/23 11:06 AM   Specimen: BLOOD LEFT HAND  Result Value Ref Range Status   Specimen Description BLOOD LEFT HAND  Final   Special Requests   Final    BOTTLES DRAWN AEROBIC ONLY Blood Culture adequate volume   Culture   Final    NO GROWTH < 24 HOURS Performed at [facility], [address] [place], [place] [address]    Report Status PENDING  Incomplete  Culture, blood (Routine X 2) w Reflex to ID Panel     Status: None (Preliminary result)   Collection Time: 03/24/23  1:26 PM   Specimen: BLOOD LEFT ARM  Result Value Ref Range Status    Specimen Description BLOOD LEFT ARM  Final   Special Requests   Final    BOTTLES DRAWN AEROBIC ONLY Blood Culture results may not be optimal due to an inadequate volume of blood received in culture bottles   Culture   Final    NO GROWTH < 24 HOURS Performed at [facility], [address] [place], [place] [address]    Report Status PENDING  Incomplete      Radiology Studies: VAS [place] DOP BILAT COMP TOS DIGITS REYNAUD  Result Date: 03/24/2023 UPPER EXTREMITY DOPPLER STUDY Patient Name:  BARBARANN MINCY  Date of Exam:   03/23/2023 Medical Rec #: [id number]   Accession #:    [id number] Date of Birth: 1935/06/23   Patient Gender: F Patient Age:   42 years Exam [facility]:  [facility] Procedure:      VAS UE DOPPLER BILAT/COMP TOS, DIGITS (TO&UE REYNAUDS) Referring Phys: Laresa Oshiro --------------------------------------------------------------------------------  Indications: Bilateral dusky/purple fingers. History:     PVD.  Risk Factors:  Hypertension, hyperlipidemia. Other Factors: History of DVT Performing Technologist: Marilynne Halsted RDMS, RVT  Examination Guidelines: A complete evaluation includes B-mode imaging, spectral Doppler, color Doppler, and power Doppler as needed of all accessible portions of each vessel. Bilateral testing is considered an integral part of a complete examination. Limited examinations for reoccurring indications may be performed as noted.  Right Doppler Findings: +-----------+--------+-----+-------------------------+-------------------------+ Site       PressureIndexDoppler                  Comments                  +-----------+--------+-----+-------------------------+-------------------------+ Subclavian              triphasic                                          +-----------+--------+-----+-------------------------+-------------------------+ Axillary                triphasic                                           +-----------+--------+-----+-------------------------+-------------------------+ Brachial   155          triphasic                                          +-----------+--------+-----+-------------------------+-------------------------+ Forearm                 triphasic                                          +-----------+--------+-----+-------------------------+-------------------------+ Radial                  triphasic                                          +-----------+--------+-----+-------------------------+-------------------------+  Ulnar                   triphasic                                          +-----------+--------+-----+-------------------------+-------------------------+ Palmar Arch                                      Patent                    +-----------+--------+-----+-------------------------+-------------------------+ Digit                   Severely dampened        unable to obtain pressure                         waveforms                due to dampened waveforms +-----------+--------+-----+-------------------------+-------------------------+  Left Doppler Findings: +-----------+--------+-----+---------------------------+------------+ Site       PressureIndexDoppler                    Comments     +-----------+--------+-----+---------------------------+------------+ Subclavian              triphasic                               +-----------+--------+-----+---------------------------+------------+ Axillary                triphasic                               +-----------+--------+-----+---------------------------+------------+ Brachial   158          triphasic                               +-----------+--------+-----+---------------------------+------------+ Forearm                 triphasic                               +-----------+--------+-----+---------------------------+------------+ Radial                   triphasic                               +-----------+--------+-----+---------------------------+------------+ Ulnar                   triphasic                               +-----------+--------+-----+---------------------------+------------+ Palmar Arch                                        Patent       +-----------+--------+-----+---------------------------+------------+ Digit      117     0.74 Severely dampened waveformsall 5 digits +-----------+--------+-----+---------------------------+------------+  Summary:  Right: No significant  arterial obstruction detected in the right        upper extremity. Severely dampened waveforms in all 5 digits. Left: No significant arterial obstruction detected in the left upper       extremity. Severely dampened waveforms in all 5 digits. *See table(s) above for measurements and observations. Electronically signed by Gerarda Fraction on 03/24/2023 at 8:45:40 AM.    Final    DG CHEST PORT 1 VIEW  Result Date: 03/24/2023 CLINICAL DATA:  Chest pain EXAM: PORTABLE CHEST 1 VIEW COMPARISON:  03/21/2023 FINDINGS: Large hiatal hernia in the left lower chest. Bibasilar airspace opacities, favor atelectasis. Small bilateral pole effusions. Heart is mildly enlarged. Aortic atherosclerosis. Mediastinal contours within the normal limits. No acute bony abnormality. IMPRESSION: Large hiatal hernia. Small bilateral pleural effusions, bibasilar atelectasis. Electronically Signed   By: Charlett Nose M.D.   On: 03/24/2023 02:08      Scheduled Meds:  atorvastatin  40 mg Oral Daily   Chlorhexidine Gluconate Cloth  6 each Topical Daily   feeding supplement  237 mL Oral BID BM   metoprolol tartrate  50 mg Oral BID   pantoprazole (PROTONIX) IV  40 mg Intravenous Q12H   pneumococcal 20-valent conjugate vaccine  0.5 mL Intramuscular Tomorrow-1000   sodium chloride flush  3 mL Intravenous Q12H   Continuous Infusions:  sodium chloride 10 mL/hr at 03/18/23 1100    sodium chloride Stopped (03/20/23 0031)   sodium chloride     ceFEPime (MAXIPIME) IV 2 g (03/25/23 0959)     LOS: 12 days   Time spent: 40 minutes   Noralee Stain, DO [facility] 03/25/2023, 3:09 PM   Available via Epic secure chat 7am-7pm After these hours, please refer to coverage provider listed on [email or web address]

## 2023-03-25 NOTE — H&P (View-Only) (Signed)
Progress Note   Subjective  Patient had some worsening respiratory status overnight - given lasix by RN who stated she had some crackles on exam. Continue to have AF with intermittent RVR treated with metoprolol. Has leg pain, treated with low dose narcotics which makes her sleepy but she is arousable and answering questions appropriately this AM. Her 5 AM labs were not drawn this morning.  She has not had any bleeding overnight, hemoglobin relatively stable.    Objective   Vital signs in last 24 hours: Temp:  [97.4 F (36.3 C)-98.2 F (36.8 C)] 97.8 F (36.6 C) (09/07 0404) Pulse Rate:  [46-124] 102 (09/07 0404) Resp:  [17-28] 20 (09/07 0404) BP: (102-187)/(57-157) 108/57 (09/07 0404) SpO2:  [94 %-100 %] 97 % (09/07 0404) Weight:  [64.4 kg] 64.4 kg (09/07 0529) Last BM Date : 03/24/23 General:    white female in NAD Heart:  irregularly irregular with rates in the low 110s-120s Pulm - some course breath sounds bilaterally Psych:  Cooperative. Normal mood and affect.  Intake/Output from previous day: 09/06 0701 - 09/07 0700 In: 120 [P.O.:120] Out: 2820 [Urine:2820] Intake/Output this shift: No intake/output data recorded.  Lab Results: Recent Labs    03/23/23 1025 03/24/23 1035 03/24/23 1326 03/24/23 1851 03/25/23 0052  WBC 14.4* 18.3*  --   --   --   HGB 12.0 13.4 12.3 12.4 11.0*  HCT 36.7 40.0 37.7 38.6 34.4*  PLT 307 238  --   --   --    BMET Recent Labs    03/22/23 0843 03/23/23 1025 03/24/23 1035  NA 135 133* 139  K 4.6 4.2 4.1  CL 102 101 96*  CO2 20* 22 25  GLUCOSE 119* 109* 108*  BUN 30* 31* 26*  CREATININE 1.13* 1.03* 0.94  CALCIUM 8.5* 8.7* 9.0   LFT No results for input(s): "PROT", "ALBUMIN", "AST", "ALT", "ALKPHOS", "BILITOT", "BILIDIR", "IBILI" in the last 72 hours. PT/INR No results for input(s): "LABPROT", "INR" in the last 72 hours.  Studies/Results:     Assessment / Plan:    88 y/o female here with the following:  Upper  GI bleed (melena / coffee ground emesis) Acute limb ischemia on anticoagulation (held for bleeding) AF with AVR CHF + possible pneumonia  Difficult situation.  The patient needs anticoagulation for her recent limb ischemia however her therapy was recently complicated by symptoms of melena and coffee-ground emesis.  Fortunately her hemoglobin has been pretty stable and she has had no bleeding overnight.  Plan is ultimately for EGD to clear her upper tract prior to resuming anticoagulation.  I had a lengthy discussion with the patient's niece yesterday (who works in our ED).  I was concerned about her respiratory status yesterday given the need for Lasix yesterday and some A-fib with RVR with rising leukocytosis and lactic acidosis.  We deferred procedure yesterday and was scheduled for this morning.  Her labs are not back yet this morning but she continues to have intermittent A-fib with RVR and required more Lasix overnight for her respiratory status.  I want to make sure she is medically optimized /stable prior to EGD.    I tried calling the patient's niece this morning, who did not answer her phone.  I spoke with her nephew Bud and reviewed everything, he will be arriving at the hospital later this morning.  I spoke with Dr. Alvino Chapel about her case who will evaluate her this morning.  We will await her morning  labs which were just drawn and reassess in a bit to see if we can proceed with EGD today or if this will be postponed to a later time if she needs further treatment for her respiratory status and AF - her HR needs to be better controlled.  She is rather tenuous and high risk for anesthesia in her current state.  Please keep her NPO for now, continue IV PPI and will circle back to reassess her in a bit to determine if / when we can do an endoscopy today or tomorrow.  Harlin Rain, MD [facility]   ADDENDUM: Labs reviewed, rising leukocytosis, productive cough, AF with RVR -  holding off on endoscopy today, high risk for anesthesia in her current state. Hgb is stable, BUN downtrending, no active bleeding. Cefepime added per Dr. Alvino Chapel. We will reassess her tomorrow for this, would keep NPO after MN. Call in the interim with any active bleeding. Thanks

## 2023-03-25 NOTE — Progress Notes (Addendum)
Progress Note   Subjective  Patient had some worsening respiratory status overnight - given lasix by RN who stated she had some crackles on exam. Continue to have AF with intermittent RVR treated with metoprolol. Has leg pain, treated with low dose narcotics which makes her sleepy but she is arousable and answering questions appropriately this AM. Her 5 AM labs were not drawn this morning.  She has not had any bleeding overnight, hemoglobin relatively stable.    Objective   Vital signs in last 24 hours: Temp:  [97.4 F (36.3 C)-98.2 F (36.8 C)] 97.8 F (36.6 C) (09/07 0404) Pulse Rate:  [46-124] 102 (09/07 0404) Resp:  [17-28] 20 (09/07 0404) BP: (102-187)/(57-157) 108/57 (09/07 0404) SpO2:  [94 %-100 %] 97 % (09/07 0404) Weight:  [64.4 kg] 64.4 kg (09/07 0529) Last BM Date : 03/24/23 General:    white female in NAD Heart:  irregularly irregular with rates in the low 110s-120s Pulm - some course breath sounds bilaterally Psych:  Cooperative. Normal mood and affect.  Intake/Output from previous day: 09/06 0701 - 09/07 0700 In: 120 [P.O.:120] Out: 2820 [Urine:2820] Intake/Output this shift: No intake/output data recorded.  Lab Results: Recent Labs    03/23/23 1025 03/24/23 1035 03/24/23 1326 03/24/23 1851 03/25/23 0052  WBC 14.4* 18.3*  --   --   --   HGB 12.0 13.4 12.3 12.4 11.0*  HCT 36.7 40.0 37.7 38.6 34.4*  PLT 307 238  --   --   --    BMET Recent Labs    03/22/23 0843 03/23/23 1025 03/24/23 1035  NA 135 133* 139  K 4.6 4.2 4.1  CL 102 101 96*  CO2 20* 22 25  GLUCOSE 119* 109* 108*  BUN 30* 31* 26*  CREATININE 1.13* 1.03* 0.94  CALCIUM 8.5* 8.7* 9.0   LFT No results for input(s): "PROT", "ALBUMIN", "AST", "ALT", "ALKPHOS", "BILITOT", "BILIDIR", "IBILI" in the last 72 hours. PT/INR No results for input(s): "LABPROT", "INR" in the last 72 hours.  Studies/Results:     Assessment / Plan:    88 y/o female here with the following:  Upper  GI bleed (melena / coffee ground emesis) Acute limb ischemia on anticoagulation (held for bleeding) AF with AVR CHF + possible pneumonia  Difficult situation.  The patient needs anticoagulation for her recent limb ischemia however her therapy was recently complicated by symptoms of melena and coffee-ground emesis.  Fortunately her hemoglobin has been pretty stable and she has had no bleeding overnight.  Plan is ultimately for EGD to clear her upper tract prior to resuming anticoagulation.  I had a lengthy discussion with the patient's niece yesterday (who works in our ED).  I was concerned about her respiratory status yesterday given the need for Lasix yesterday and some A-fib with RVR with rising leukocytosis and lactic acidosis.  We deferred procedure yesterday and was scheduled for this morning.  Her labs are not back yet this morning but she continues to have intermittent A-fib with RVR and required more Lasix overnight for her respiratory status.  I want to make sure she is medically optimized /stable prior to EGD.    I tried calling the patient's niece this morning, who did not answer her phone.  I spoke with her nephew Bud and reviewed everything, he will be arriving at the hospital later this morning.  I spoke with Dr. Alvino Chapel about her case who will evaluate her this morning.  We will await her morning  labs which were just drawn and reassess in a bit to see if we can proceed with EGD today or if this will be postponed to a later time if she needs further treatment for her respiratory status and AF - her HR needs to be better controlled.  She is rather tenuous and high risk for anesthesia in her current state.  Please keep her NPO for now, continue IV PPI and will circle back to reassess her in a bit to determine if / when we can do an endoscopy today or tomorrow.  Harlin Rain, MD [facility]   ADDENDUM: Labs reviewed, rising leukocytosis, productive cough, AF with RVR -  holding off on endoscopy today, high risk for anesthesia in her current state. Hgb is stable, BUN downtrending, no active bleeding. Cefepime added per Dr. Alvino Chapel. We will reassess her tomorrow for this, would keep NPO after MN. Call in the interim with any active bleeding. Thanks

## 2023-03-25 NOTE — Progress Notes (Signed)
Patient has no urine output for 8 hours,bladder scan done and obtain 760+ volume. Dr. Joneen Roach made aware. In and out cath done and obtain 720 urine output.Will continue to monitor.

## 2023-03-26 ENCOUNTER — Other Ambulatory Visit: Payer: Self-pay

## 2023-03-26 ENCOUNTER — Inpatient Hospital Stay (HOSPITAL_COMMUNITY): Payer: Medicare Other

## 2023-03-26 ENCOUNTER — Inpatient Hospital Stay (HOSPITAL_COMMUNITY): Payer: Medicare Other | Admitting: Certified Registered Nurse Anesthetist

## 2023-03-26 ENCOUNTER — Encounter (HOSPITAL_COMMUNITY): Payer: Self-pay | Admitting: Internal Medicine

## 2023-03-26 ENCOUNTER — Encounter (HOSPITAL_COMMUNITY)
Admission: EM | Disposition: A | Discharge status: Skilled Nursing Facility | Payer: Self-pay | Source: Home / Self Care | Attending: Internal Medicine

## 2023-03-26 DIAGNOSIS — K209 Esophagitis, unspecified without bleeding: Secondary | ICD-10-CM | POA: Diagnosis not present

## 2023-03-26 DIAGNOSIS — K2970 Gastritis, unspecified, without bleeding: Secondary | ICD-10-CM

## 2023-03-26 DIAGNOSIS — K269 Duodenal ulcer, unspecified as acute or chronic, without hemorrhage or perforation: Secondary | ICD-10-CM

## 2023-03-26 DIAGNOSIS — I70221 Atherosclerosis of native arteries of extremities with rest pain, right leg: Secondary | ICD-10-CM | POA: Diagnosis not present

## 2023-03-26 DIAGNOSIS — K2270 Barrett's esophagus without dysplasia: Secondary | ICD-10-CM

## 2023-03-26 DIAGNOSIS — K449 Diaphragmatic hernia without obstruction or gangrene: Secondary | ICD-10-CM

## 2023-03-26 HISTORY — PX: ESOPHAGOGASTRODUODENOSCOPY (EGD) WITH PROPOFOL: SHX5813

## 2023-03-26 HISTORY — PX: BIOPSY: SHX5522

## 2023-03-26 LAB — BASIC METABOLIC PANEL
Anion gap: 9 (ref 5–15)
BUN: 18 mg/dL (ref 8–23)
CO2: 29 mmol/L (ref 22–32)
Calcium: 8.5 mg/dL — ABNORMAL LOW (ref 8.9–10.3)
Chloride: 99 mmol/L (ref 98–111)
Creatinine, Ser: 0.71 mg/dL (ref 0.44–1.00)
GFR, Estimated: >60 mL/min (ref >60)
Glucose, Bld: 114 mg/dL — ABNORMAL HIGH (ref 70–99)
Potassium: 3 mmol/L — ABNORMAL LOW (ref 3.5–5.1)
Sodium: 137 mmol/L (ref 135–145)

## 2023-03-26 LAB — CBC WITH DIFFERENTIAL/PLATELET
Abs Immature Granulocytes: 0.07 10*3/uL (ref 0.00–0.07)
Basophils Absolute: 0.1 10*3/uL (ref 0.0–0.1)
Basophils Relative: 0 %
Eosinophils Absolute: 0.3 10*3/uL (ref 0.0–0.5)
Eosinophils Relative: 2 %
HCT: 32.8 % — ABNORMAL LOW (ref 36.0–46.0)
Hemoglobin: 10.7 g/dL — ABNORMAL LOW (ref 12.0–15.0)
Immature Granulocytes: 1 %
Lymphocytes Relative: 7 %
Lymphs Abs: 1 10*3/uL (ref 0.7–4.0)
MCH: 32.3 pg (ref 26.0–34.0)
MCHC: 32.6 g/dL (ref 30.0–36.0)
MCV: 99.1 fL (ref 80.0–100.0)
Monocytes Absolute: 0.8 10*3/uL (ref 0.1–1.0)
Monocytes Relative: 5 %
Neutro Abs: 12.4 10*3/uL — ABNORMAL HIGH (ref 1.7–7.7)
Neutrophils Relative %: 85 %
Platelets: 275 10*3/uL (ref 150–400)
RBC: 3.31 MIL/uL — ABNORMAL LOW (ref 3.87–5.11)
RDW: 15.3 % (ref 11.5–15.5)
WBC: 14.7 10*3/uL — ABNORMAL HIGH (ref 4.0–10.5)
nRBC: 0 % (ref 0.0–0.2)

## 2023-03-26 LAB — APTT: aPTT: 159 s — ABNORMAL HIGH (ref 24–36)

## 2023-03-26 LAB — HEPARIN LEVEL (UNFRACTIONATED): Heparin Unfractionated: >1.1 [IU]/mL — ABNORMAL HIGH (ref 0.30–0.70)

## 2023-03-26 SURGERY — ESOPHAGOGASTRODUODENOSCOPY (EGD) WITH PROPOFOL
Anesthesia: Monitor Anesthesia Care

## 2023-03-26 MED ORDER — LACTATED RINGERS IV SOLN
INTRAVENOUS | Status: DC | PRN
Start: 2023-03-26 — End: 2023-03-26

## 2023-03-26 MED ORDER — LACTATED RINGERS IV SOLN
INTRAVENOUS | Status: AC | PRN
  Administered 2023-03-26: 1000 mL via INTRAVENOUS

## 2023-03-26 MED ORDER — SODIUM CHLORIDE 0.9% FLUSH
10.0000 mL | Freq: Two times a day (BID) | INTRAVENOUS | Status: DC
  Administered 2023-03-26: 10 mL
  Administered 2023-03-27: 20 mL
  Administered 2023-03-27 – 2023-03-30 (×5): 10 mL

## 2023-03-26 MED ORDER — SUCRALFATE 1 GM/10ML PO SUSP
1.0000 g | Freq: Four times a day (QID) | ORAL | Status: DC
  Administered 2023-03-26 – 2023-03-30 (×18): 1 g via ORAL
  Filled 2023-03-26 (×18): qty 10

## 2023-03-26 MED ORDER — POTASSIUM CHLORIDE 10 MEQ/100ML IV SOLN
10.0000 meq | INTRAVENOUS | Status: AC
  Administered 2023-03-26 (×4): 10 meq via INTRAVENOUS
  Filled 2023-03-26 (×4): qty 100

## 2023-03-26 MED ORDER — LIDOCAINE 2% (20 MG/ML) 5 ML SYRINGE
INTRAMUSCULAR | Status: DC | PRN
  Administered 2023-03-26: 40 mg via INTRAVENOUS
  Administered 2023-03-26: 20 mg via INTRAVENOUS

## 2023-03-26 MED ORDER — PHENYLEPHRINE 80 MCG/ML (10ML) SYRINGE FOR IV PUSH (FOR BLOOD PRESSURE SUPPORT)
PREFILLED_SYRINGE | INTRAVENOUS | Status: DC | PRN
Start: 2023-03-26 — End: 2023-03-26
  Administered 2023-03-26: 160 ug via INTRAVENOUS
  Administered 2023-03-26: 80 ug via INTRAVENOUS

## 2023-03-26 MED ORDER — PROPOFOL 10 MG/ML IV BOLUS
INTRAVENOUS | Status: DC | PRN
  Administered 2023-03-26: 40 mg via INTRAVENOUS
  Administered 2023-03-26 (×2): 20 mg via INTRAVENOUS

## 2023-03-26 MED ORDER — HEPARIN (PORCINE) 25000 UT/250ML-% IV SOLN
700.0000 [IU]/h | INTRAVENOUS | Status: DC
  Administered 2023-03-26: 700 [IU]/h via INTRAVENOUS

## 2023-03-26 MED ORDER — HEPARIN (PORCINE) 25000 UT/250ML-% IV SOLN
1050.0000 [IU]/h | INTRAVENOUS | Status: DC
  Administered 2023-03-26: 1050 [IU]/h via INTRAVENOUS
  Filled 2023-03-26: qty 250

## 2023-03-26 MED ORDER — SODIUM CHLORIDE 0.9% FLUSH: 10.0000 mL | INTRAVENOUS | Status: DC | PRN

## 2023-03-26 SURGICAL SUPPLY — 15 items

## 2023-03-26 NOTE — Plan of Care (Signed)
POC is progressing.

## 2023-03-26 NOTE — Progress Notes (Addendum)
PIV consult: Arrived to room, pt being transported to procedure. Per RN, second line will be obtained by anesthesia. Cancel consult.

## 2023-03-26 NOTE — Progress Notes (Signed)
ANTICOAGULATION CONSULT NOTE - Initial Consult  Pharmacy Consult for heparin Indication:  PAD but with upper GI bleed  No Known Allergies  Patient Measurements: Height: 5\' 2"  (157.5 cm) Weight: 62.8 kg (138 lb 7.2 oz) IBW/kg (Calculated) : 50.1 Heparin Dosing Weight: 62.8 kg  Vital Signs: Temp: 97.9 F (36.6 C) (09/08 1013) Temp Source: Oral (09/08 1013) BP: 151/98 (09/08 1013) Pulse Rate: 83 (09/08 1013)  Labs: Recent Labs    03/24/23 1035 03/24/23 1326 03/25/23 0717 03/25/23 1219 03/26/23 0333  HGB 13.4   < > 11.4* 10.9* 10.7*  HCT 40.0   < > 35.0* 33.4* 32.8*  PLT 238  --  300  --  275  CREATININE 0.94  --  0.75  --  0.71   < > = values in this interval not displayed.    Estimated Creatinine Clearance: 44 mL/min (by C-G formula based on SCr of 0.71 mg/dL).   Medical History: History reviewed. No pertinent past medical history.  Medications:  Scheduled:   atorvastatin  40 mg Oral Daily   Chlorhexidine Gluconate Cloth  6 each Topical Daily   feeding supplement  237 mL Oral BID BM   metoprolol tartrate  100 mg Oral BID   pantoprazole (PROTONIX) IV  40 mg Intravenous Q12H   pneumococcal 20-valent conjugate vaccine  0.5 mL Intramuscular Tomorrow-1000   sodium chloride flush  3 mL Intravenous Q12H   sucralfate  1 g Oral Q6H   Infusions:   sodium chloride 10 mL/hr at 03/25/23 1725   sodium chloride Stopped (03/20/23 0031)   sodium chloride     ceFEPime (MAXIPIME) IV 2 g (03/25/23 2200)   potassium chloride 10 mEq (03/26/23 1016)    Assessment: Patient presented with acute on chronic critical limb ischemia in her right lower extremity. Initially she was initiated on Eliquis, however then developed coffee ground emesis and dark stools on 9/6, so both Eliquis and aspirin were held due to concern for upper GI bleed. She was set to have an EGD on 9/7, but this was postponed due to worsening renal function and she was started on IV PPI BID.   Successfully  underwent EGD this morning and no active bleeds were found, likely stopped with PPI use. Due to need for anticoagulation in setting of critical limb ischemia, pharmacy has been consulted to initiate heparin infusion.   Last Eliquis dose was 9/5 at 2147. Since last dose was > 48 hours ago, will not utilize aPTT monitoring in conjunction with anti-Xa levels. Hgb stable at 10.7, PLTs stable at 275. Due to recent GIB, will target a heparin level of 0.3-0.5.  Goal of Therapy:  Heparin level 0.3-0.5 units/ml Monitor platelets by anticoagulation protocol: Yes   Plan:  No bolus given in setting of recent GIB Start heparin infusion at 1050 units/hr Check anti-Xa level in 8 hours and daily while on heparin Continue to monitor H&H and platelets  Sarah Wolf, PharmD PGY1 Pharmacy Resident 03/26/2023 11:02 AM

## 2023-03-26 NOTE — Transfer of Care (Signed)
Immediate Anesthesia Transfer of Care Note  Patient: Sarah Wolf  Procedure(s) Performed: ESOPHAGOGASTRODUODENOSCOPY (EGD) WITH PROPOFOL  Patient Location: PACU  Anesthesia Type:MAC  Level of Consciousness: drowsy and patient cooperative  Airway & Oxygen Therapy: Patient Spontanous Breathing and Patient connected to face mask oxygen  Post-op Assessment: Report given to RN and Post -op Vital signs reviewed and stable  Post vital signs: Reviewed and stable  Last Vitals:  Vitals Value Taken Time  BP 124/73 03/26/23 0931  Temp 36.6 C 03/26/23 0930  Pulse 98 03/26/23 0933  Resp 17 03/26/23 0933  SpO2 100 % 03/26/23 0933  Vitals shown include unfiled device data.  Last Pain:  Vitals:   03/26/23 0858  TempSrc: Temporal  PainSc: 0-No pain      Patients Stated Pain Goal: 2 (03/24/23 2224)  Complications: No notable events documented.

## 2023-03-26 NOTE — Op Note (Signed)
[facility] Patient Name: Sarah Wolf Procedure Date : 03/25/2023 MRN: [id number] Attending MD: Willaim Rayas. Adela Lank , MD, [id number] Date of Birth: [birth year] CSN: [id number] Age: 88 Admit Type: Inpatient Procedure:                Upper GI endoscopy Indications:              Coffee-ground emesis, Melena in the setting of                            recent anticoagulation for ischemic limb. Large                            hiatal hernia noted on imaging. She stopped                            bleeding with IV PPI and holding anticoagulation.                            Of note, procedure date is 03/26/23 around 915 AM                            (procedure date listed above is incorrect) Providers:                Willaim Rayas. Adela Lank, MD, Eliberto Ivory, RN, Marja Kays, Technician Referring MD:              Medicines:                Monitored Anesthesia Care Complications:            No immediate complications. Estimated blood loss:                            Minimal. Estimated Blood Loss:     Estimated blood loss was minimal. Procedure:                Pre-Anesthesia Assessment:                           - Prior to the procedure, a History and Physical                            was performed, and patient medications and                            allergies were reviewed. The patient's tolerance of                            previous anesthesia was also reviewed. The risks                            and benefits of the procedure and the sedation  options and risks were discussed with the patient.                            All questions were answered, and informed consent                            was obtained. Prior Anticoagulants: The patient has                            taken Eliquis (apixaban), last dose was 3 days                            prior to procedure. ASA Grade Assessment: IV - A                             patient with severe systemic disease that is a                            constant threat to life. After reviewing the risks                            and benefits, the patient was deemed in                            satisfactory condition to undergo the procedure.                           After obtaining informed consent, the endoscope was                            passed under direct vision. Throughout the                            procedure, the patient's blood pressure, pulse, and                            oxygen saturations were monitored continuously. The                            [id number]-H190 ([id number]) Olympus endoscope was introduced                            through the mouth, and advanced to the second part                            of duodenum. The upper GI endoscopy was                            accomplished without difficulty. The patient                            tolerated the procedure well. Scope In: Scope Out: Findings:      Esophagogastric landmarks were identified: the gastroesophageal  junction       was found at 30 cm from the incisors. Z line not able to be seen /       measured due to esophagitis / ulceration.      A large hiatal hernia was present.      Severe circumferential esophagitis / ulceration with no active bleeding       or stigmata was found thoughout most of the entire esophagus. Started at       17cm from the incisors and distally a few cms above the GEJ. One small       biopsy was taken near the periphery of the ulcerated area to rule out       viral etiology.      Suspected long segment Barrett's esophagus without nodularity was       present in the distal esophagus up until the diffuse ulceration - Z line       not able to be appreciated. Biopsies not taken given recent bleeding.      The exam of the esophagus was otherwise normal.      Diffuse moderate inflammation characterized by erythema, friability and       granularity was found in the  entire examined stomach. Biopsies were       taken with a cold forceps for Helicobacter pylori testing.      The exam of the stomach was otherwise normal.      Five non-bleeding superficial duodenal ulcers with a clean ulcer base       (Forrest Class III) were found in the duodenal bulb. Most small 2-46mm in       size, the largest lesion was 10 mm in largest dimension.      The exam of the duodenum was otherwise normal. Impression:               - Esophagogastric landmarks identified.                           - Large hiatal hernia.                           - Severe cicumferential esophagitis / ulceration                            with no bleeding. Biopsied.                           - Suspected long segment of Barrett's esophagus -                            unable to be measured due to esophagitis, biopsies                            not taken.                           - Gastritis. Biopsied.                           - Normal stomach otherwise.                           -  Non-bleeding duodenal ulcers with a clean ulcer                            base (Forrest Class III).                           Patient has multiple sites of possible bleeding -                            most likely from esophageal ulceration / duodenal                            ulcers in the setting of anticoagulation. There was                            no focal high risk stigmata for bleeding to treat                            endoscopically, medical therapy with PPI and time                            is needed to heal the ulcerations.                           Difficult situation, she does need anticoagulation                            for recent limb ischemia. Can consider trial of                            resuming heparin drip and monitoring closely for                            recurrent bleeding. Will discuss with primary                            service and the patient's family. Of note, as the                             patient's esophagitis heals, she is at risk for                            stricturing / dysphagia. Recommendation:           - Return patient to hospital ward for ongoing care.                           - Full liquid diet or diet per speech path                           - Continue present medications.                           - IV protonix 40mg  twice daily while hospitalized                           -  Start liquid carafate 10cc PO every 6 hours -                            this will make stools dark                           - Await pathology results.                           - Consideration for resuming anticoagulation with                            heparin drip with close monitoring given recent                            limb ischemia and need for anticoagulation,                            understanding she is at risk for recurrent                            bleeding, however she has stopped with PPI alone.                            Nothing focal that warrants endoscopic therapy                            currently. If she rebleeds will need to hold                            anticoagulation again. Will discuss these issues                            with primary team / family                           - Otherwise we will sign off for now, call with                            questions moving forward Procedure Code(s):        --- Professional ---                           [phone], Esophagogastroduodenoscopy, flexible,                            transoral; with biopsy, single or multiple Diagnosis Code(s):        --- Professional ---                           K22.70, Barrett's esophagus without dysplasia                           K44.9, Diaphragmatic hernia without obstruction or  gangrene                           K20.90, Esophagitis, unspecified without bleeding                           K29.70, Gastritis, unspecified, without  bleeding                           K26.9, Duodenal ulcer, unspecified as acute or                            chronic, without hemorrhage or perforation                           K92.0, Hematemesis                           K92.1, Melena (includes Hematochezia) CPT copyright 2022 American [employer] Association. All rights reserved. The codes documented in this report are preliminary and upon coder review may  be revised to meet current compliance requirements. Viviann Spare P. Pattijo Juste, MD 03/26/2023 9:40:08 AM This report has been signed electronically. Number of Addenda: 0

## 2023-03-26 NOTE — Progress Notes (Signed)
PIV consult: Bruising and erythema noted BUE. Redness noted L AC area. New site established L forearm-- appears to avoid all previous sites on [place] view. Limited venous access options.  Please consider PICC or central line if pt will need prolonged venous infusions.

## 2023-03-26 NOTE — Interval H&P Note (Signed)
History and Physical Interval Note: Patient looks much better today than yesterday. Much more alert, responding appropriate to questioning. States her breathing feels much better. WBC came down on antibiotics. Her AR is better controlled with normal heart rate. I think she is stable and optimized for the EGD today. I discussed with the patient what this would entail, risks of the exam and anesthesia and she wants to proceed. Spoke with [name] who understands, and wishes to proceed with the exam. Hopefully if we can identify source of bleeding and risk stratify rebleeding risk if she resumes anticoagulation. All questions answered, they understand and wish to proceed.   03/26/2023 9:02 AM  Sarah Wolf  has presented today for surgery, with the diagnosis of upper GI bleed.  The various methods of treatment have been discussed with the patient and family. After consideration of risks, benefits and other options for treatment, the patient has consented to  Procedure(s): ESOPHAGOGASTRODUODENOSCOPY (EGD) WITH PROPOFOL (N/A) as a surgical intervention.  The patient's history has been reviewed, patient examined, no change in status, stable for surgery.  I have reviewed the patient's chart and labs.  Questions were answered to the patient's satisfaction.     Viviann Spare P Kaye Mitro

## 2023-03-26 NOTE — Progress Notes (Signed)
ANTICOAGULATION CONSULT NOTE -   Pharmacy Consult for heparin Indication:  PAD but with upper GI bleed  No Known Allergies  Patient Measurements: Height: 5\' 2"  (157.5 cm) Weight: 62.8 kg (138 lb 7.2 oz) IBW/kg (Calculated) : 50.1 Heparin Dosing Weight: 62.8 kg  Vital Signs: Temp: 97.9 F (36.6 C) (09/08 1957) Temp Source: Oral (09/08 1957) BP: 133/78 (09/08 2000) Pulse Rate: 100 (09/08 2000)  Labs: Recent Labs    03/24/23 1035 03/24/23 1326 03/25/23 0717 03/25/23 1219 03/26/23 0333 03/26/23 2113  HGB 13.4   < > 11.4* 10.9* 10.7*  --   HCT 40.0   < > 35.0* 33.4* 32.8*  --   PLT 238  --  300  --  275  --   APTT  --   --   --   --   --  159*  HEPARINUNFRC  --   --   --   --   --  >1.10*  CREATININE 0.94  --  0.75  --  0.71  --    < > = values in this interval not displayed.    Estimated Creatinine Clearance: 44 mL/min (by C-G formula based on SCr of 0.71 mg/dL).   Medical History: History reviewed. No pertinent past medical history.  Medications:  Scheduled:   atorvastatin  40 mg Oral Daily   Chlorhexidine Gluconate Cloth  6 each Topical Daily   feeding supplement  237 mL Oral BID BM   metoprolol tartrate  100 mg Oral BID   pantoprazole (PROTONIX) IV  40 mg Intravenous Q12H   pneumococcal 20-valent conjugate vaccine  0.5 mL Intramuscular Tomorrow-1000   sodium chloride flush  10-40 mL Intracatheter Q12H   sodium chloride flush  3 mL Intravenous Q12H   sucralfate  1 g Oral Q6H   Infusions:   sodium chloride 10 mL/hr at 03/25/23 1725   sodium chloride Stopped (03/20/23 0031)   sodium chloride     ceFEPime (MAXIPIME) IV 2 g (03/26/23 2126)   heparin      Assessment: Patient presented with acute on chronic critical limb ischemia in her right lower extremity. Initially she was initiated on Eliquis, however then developed coffee ground emesis and dark stools on 9/6, so both Eliquis and aspirin were held due to concern for upper GI bleed. She was set to have an  EGD on 9/7, but this was postponed due to worsening renal function and she was started on IV PPI BID.   Successfully underwent EGD this morning and no active bleeds were found, likely stopped with PPI use. Due to need for anticoagulation in setting of critical limb ischemia, pharmacy has been consulted to initiate heparin infusion.   Last Eliquis dose was 9/5 at 2147.   This evening's aPTT and heparin level are both above goal.  No overt bleeding or complications noted.  Heparin running through PICC and level collected via lab stick.  Goal of Therapy:  Heparin level 0.3-0.5 units/ml Monitor platelets by anticoagulation protocol: Yes   Plan:  Hod heparin infusion x 1 hr. Reduce heparin infusion to 700 units/hr Recheck heparin level and aPTT 8 hrs after gtt resumes.  Reece Leader, Colon Flattery, BCCP Clinical Pharmacist  03/26/2023 10:27 PM   [employer] pharmacy phone numbers are listed on [email or web address]

## 2023-03-26 NOTE — Anesthesia Preprocedure Evaluation (Addendum)
Anesthesia Evaluation  Patient identified by MRN, date of birth, ID band Patient awake    Reviewed: Allergy & Precautions, NPO status , Patient's Chart, lab work & pertinent test results  Airway Mallampati: II  TM Distance: >3 FB Neck ROM: Full    Dental  (+) Dental Advisory Given, Edentulous Lower, Edentulous Upper   Pulmonary pneumonia   Pulmonary exam normal breath sounds clear to auscultation       Cardiovascular + Peripheral Vascular Disease (8/28 thrombolysis of RLE with SFA stent), +CHF and + DVT  + dysrhythmias Atrial Fibrillation  Rhythm:Irregular Rate:Abnormal  TTE with LVEF 40-45%   Neuro/Psych negative neurological ROS     GI/Hepatic Neg liver ROS, hiatal hernia,,,upper GI bleed   Endo/Other  negative endocrine ROS    Renal/GU negative Renal ROS     Musculoskeletal negative musculoskeletal ROS (+)    Abdominal   Peds  Hematology  (+) Blood dyscrasia, anemia   Anesthesia Other Findings Day of surgery medications reviewed with the patient.  Reproductive/Obstetrics                             Anesthesia Physical Anesthesia Plan  ASA: 4  Anesthesia Plan: MAC   Post-op Pain Management: Minimal or no pain anticipated   Induction: Intravenous  PONV Risk Score and Plan: 2 and TIVA and Treatment may vary due to age or medical condition  Airway Management Planned: Natural Airway and Simple Face Mask  Additional Equipment:   Intra-op Plan:   Post-operative Plan:   Informed Consent: I have reviewed the patients History and Physical, chart, labs and discussed the procedure including the risks, benefits and alternatives for the proposed anesthesia with the patient or authorized representative who has indicated his/her understanding and acceptance.   Patient has DNR.  Discussed DNR with patient, Discussed DNR with power of attorney and Continue DNR.   Dental advisory  given  Plan Discussed with: CRNA  Anesthesia Plan Comments: (Consent with patient's nephew via phone)        Anesthesia Quick Evaluation

## 2023-03-26 NOTE — Progress Notes (Signed)
PROGRESS NOTE    Sarah Wolf  BMW:[id number] DOB: 07/01/1935 DOA: 03/13/2023 PCP: Tally Joe, MD     Brief Narrative:  Sarah Wolf is a 88 y.o. female with past medical history significant for HTN, paroxysmal atrial fibrillation, history of DVT no longer on anticoagulation who presents to [facility] ED on 8/26 with complaint of bilateral lower extremity pain, greatest on right.  Onset morning of admission, pain all the way down her right leg severe nature.  Review of records note patient with left lower extremity DVT 2020 as well as echo noting A-fib at that time.  She was on Xarelto temporarily after being found to have a LLE DVT following a fall with femur fracture 03/08/2019.  At baseline lives alone, ambulatory with a cane.  Patient denied chest pain, no palpitations, no shortness of breath, no cough, no nausea/vomiting/diarrhea, no lower extremity edema.  And route with EMS patient was noted to be in A-fib with RVR.   In the emergency department patient was noted to be afebrile with pulse elevated up to 145 and atrial fibrillation, systolic blood pressures noted to be elevated into the 160s., and O2 saturations currently maintained on 1.5 L nasal cannula oxygen.  Labs noted sodium 133, potassium 3.5, chloride 93, TSH 2.94 , BNP 799.9, and high-sensitivity troponins 55->57.  Doppler ultrasounds of the lower extremity did not note any signs of a DVT, but did note occlusion of the proximal to mid SFA.  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  As a surgery have been formally consulted.  Patient had been started on heparin drip and given metoprolol IV intermittently.  Cardiology and vascular surgery were consulted.  TRH  consulted for admission for further evaluation and management of A-fib with RVR and acute occlusion of right SFA and left popliteal artery.   Significant Hospital events: 8/26 admitted to [facility] 8/28 thrombolysis of RLE with SFA stent, worsening encephalopathy and possible aspiration event leading to worsening hypoxia; pccm consulted and required intubation; Tx to ICU.  8/30: Extubated 9/1: transferred back to hospitalist service; pending SNF placement 9/6: coffee ground emesis, GI consulted  9/7: EGD deferred, dx PNA and started on cefepime  9/8: EGD found large hiatal hernia, severe circumferential esophagitis with ulceration without bleeding.  Suspected long segment of Barrett's esophagus, gastritis, nonbleeding duodenal ulcers.  Has multiple sites of possible bleeding, most likely from esophageal ulceration, duodenal ulcers in setting of anticoagulation.  Continue medical therapy with PPI.  Resume anticoagulation with heparin drip  New events last 24 hours / Subjective: Reports she is feeling well today.  No pain.  No further emesis reported.  Planned for EGD later this morning.  Assessment & Plan:   Principal Problem:   Critical limb ischemia of right lower extremity (HCC) Active Problems:   Atrial fibrillation with RVR (HCC)   Thrombus of right atrial appendage   Chronic HFpEF   Myocardial injury due to Afib   History of DVT (deep vein thrombosis)   Lesion of mediastinum   Hyponatremia   Acute respiratory failure with hypoxia (HCC)   Goals of care, counseling/discussion   Aspiration into airway   Upper GI bleed   Anticoagulated   Acute esophagitis   Gastritis and gastroduodenitis   Duodenal ulcer  Critical limb ischemia right lower extremity likely secondary to cardioembolic event Pain of pain to lower legs, acutely.  CT angiogram notable for acute occlusion right SFA and left popliteal artery.  Vascular surgery was consulted and patient underwent right lower extremity  angiogram/aortogram with peripheral thrombectomy with SFA stent.   -- Follow-up 4-6 weeks vascular surgery with RLE duplex/ABIs -- Aspirin and Eliquis are on hold due to GI bleed --> started IV heparin and will monitor GIB  Upper GI bleed -- FOBT+ -- Hold aspirin and Eliquis --> started IV heparin and will monitor GIB -- EGD found large hiatal hernia, severe circumferential esophagitis with ulceration without bleeding.  Suspected long segment of Barrett's esophagus, gastritis, nonbleeding duodenal ulcers.  Has multiple sites of possible bleeding, most likely from esophageal ulceration, duodenal ulcers in setting of anticoagulation.  Continue medical therapy with PPI.  Resume anticoagulation with heparin drip -- PPI IV  -- Discussed with Dr. Adela Lank today  Sepsis, not POA, secondary to HCAP -- Worsening WBC with productive cough -- Blood cx negative to date -- UA negative  -- Cefepime started 9/7    Acute respiratory failure with hypoxia Suspect aspiration event Following aortogram with thrombectomy, patient has suspected aspiration event and developed acute respiratory failure with hypoxia.  Patient required intubation and transferred to the intensive care unit.  Patient was successfully extubated on 03/17/2023.  Tracheal aspirate with normal respiratory flora, MRSA PCR negative.  Completed 7-day course of antibiotics with Unasyn followed by ceftriaxone. -- Incentive spirometry -- Continue supplemental oxygen, maintain SpO2 > 90%   Acute encephalopathy: Resolved Etiology likely secondary to respiratory failure with hypoxia/hypercarbia after aspiration event as above.  CT head negative.  EEG with no seizure or epileptiform discharges noted.  Ammonia level within normal limits. -- Supportive care   Paroxysmal atrial fibrillation with RVR Previously noted to be in A-fib in 2020, but not on anticoagulation.  Given new embolic event, now will likely need to be on lifelong anticoagulation.   Cardiology avoiding cardioversion in the setting of possible LAA appendage thrombus. -- Cardiology following, appreciate assistance -- Metoprolol -increased dose  -- Eliquis 5 mg p.o. twice daily --> started IV heparin and will monitor GIB -- Continue to monitor on telemetry   Chronic systolic congestive heart failure TTE with LVEF 40-45%, endocardial border not optimally defined to evaluate for regional wall motion abnormalities.  Seen by cardiology and now signed off. -- Metoprolol -increased dose    Hx DVT -- Eliquis 5 mg p.o. twice daily --> started IV heparin and will monitor GIB   Type 2 diabetes mellitus Hemoglobin A1c 6.6.  Diet controlled.   Urinary retention Required Foley catheter, now resolved. -- Monitor urine output   Lesion of mediastinum Noted incidental finding on CT chest of nodule anterior mediastinum -- Outpatient follow-up with CT surgery   Weakness/debility/deconditioning: PT/OT recommending SNF placement. -- TOC consulted  CKD stage IIIa -- Stable  Large diaphragmatic hernia -- Seen on chest x-ray 9/3 -- Could consider outpatient general surgery consultation if surgical intervention is desired once her acute illness is stable  Poor perfusion of her upper extremities -- Her fingers bilaterally seem dusky on examination.  This has been ongoing since last week per niece.  Vascular duplex of upper extremities showing no significant arterial obstruction bilaterally.  Did have severely dampened waveforms in all 10 digits -- ?Raynaud's. Left hand appearance improved.   Hypokalemia -- Replace   DVT prophylaxis: Eliquis on hold --> started IV heparin and will  monitor GIB  Code Status: DNR, no CPR if pulseless, DNI Family Communication: nephew at bedside  Disposition Plan: SNF Status is: Inpatient Remains inpatient appropriate because: Not medically ready for discharge, GI bleed.  SNF search ongoing. DC > 2 days     Antimicrobials:  Anti-infectives  (From admission, onward)    Start     Dose/Rate Route Frequency Ordered Stop   03/25/23 0945  ceFEPIme (MAXIPIME) 2 g in sodium chloride 0.9 % 100 mL IVPB        2 g 200 mL/hr over 30 Minutes Intravenous Every 12 hours 03/25/23 0853     03/24/23 0500  Ampicillin-Sulbactam (UNASYN) 3 g in sodium chloride 0.9 % 100 mL IVPB  Status:  Discontinued        3 g 200 mL/hr over 30 Minutes Intravenous Every 8 hours 03/24/23 0420 03/25/23 0853   03/18/23 0830  cefTRIAXone (ROCEPHIN) 2 g in sodium chloride 0.9 % 100 mL IVPB        2 g 200 mL/hr over 30 Minutes Intravenous Every 24 hours 03/18/23 0731 03/21/23 0952   03/15/23 2330  Ampicillin-Sulbactam (UNASYN) 3 g in sodium chloride 0.9 % 100 mL IVPB  Status:  Discontinued        3 g 200 mL/hr over 30 Minutes Intravenous Every 6 hours 03/15/23 2239 03/18/23 0731        Objective: Vitals:   03/26/23 0930 03/26/23 0945 03/26/23 1013 03/26/23 1146  BP: 124/73 127/83 (!) 151/98 99/82  Pulse: 93 98 83 100  Resp: 16 14 20 15   Temp: 97.9 F (36.6 C) 97.9 F (36.6 C) 97.9 F (36.6 C) 97.6 F (36.4 C)  TempSrc:   Oral Oral  SpO2: 100% 99% 99% 100%  Weight:      Height:        Intake/Output Summary (Last 24 hours) at 03/26/2023 1226 Last data filed at 03/26/2023 1223 Gross per 24 hour  Intake 1255.83 ml  Output 700 ml  Net 555.83 ml   Filed Weights   03/24/23 0511 03/25/23 0529 03/26/23 0624  Weight: 64.3 kg 64.4 kg 62.8 kg   Examination: General exam: Appears calm and comfortable  Respiratory system: Clear to auscultation anteriorly, respiratory effort is normal without distress Cardiovascular system: S1 & S2 heard, rate 100s. No pedal edema. Gastrointestinal system: Abdomen is nondistended, soft and nontender. Normal bowel sounds heard. Central nervous system: Alert and oriented. Non focal exam. Speech clear  Extremities: Symmetric in appearance bilaterally  Psychiatry: Judgement and insight appear stable. Mood & affect appropriate.      Data Reviewed: I have personally reviewed following labs and imaging studies  CBC: Recent Labs  Lab 03/22/23 0357 03/23/23 1025 03/24/23 1035 03/24/23 1326 03/24/23 1851 03/25/23 0052 03/25/23 0717 03/25/23 1219 03/26/23 0333  WBC 13.6* 14.4* 18.3*  --   --   --  24.6*  --  14.7*  NEUTROABS  --   --   --   --   --   --   --   --  12.4*  HGB 15.3* 12.0 13.4   < > 12.4 11.0* 11.4* 10.9* 10.7*  HCT 44.7 36.7 40.0   < > 38.6 34.4* 35.0* 33.4* 32.8*  MCV 95.5 95.6 96.2  --   --   --  95.9  --  99.1  PLT 289 307 238  --   --   --  300  --  275   < > = values in this interval not displayed.  Basic Metabolic Panel: Recent Labs  Lab 03/20/23 0402 03/21/23 0312 03/22/23 0843 03/23/23 1025 03/24/23 1035 03/25/23 0717 03/26/23 0333  NA 134*   < > 135 133* 139 135 137  K 5.0   < > 4.6 4.2 4.1 4.2 3.0*  CL 99   < > 102 101 96* 96* 99  CO2 24   < > 20* 22 25 27 29   GLUCOSE 126*   < > 119* 109* 108* 103* 114*  BUN 18   < > 30* 31* 26* 18 18  CREATININE 0.69   < > 1.13* 1.03* 0.94 0.75 0.71  CALCIUM 9.0   < > 8.5* 8.7* 9.0 8.2* 8.5*  MG 2.1  --   --  2.1  --   --   --   PHOS 2.8  --   --  3.3  --   --   --    < > = values in this interval not displayed.   GFR: Estimated Creatinine Clearance: 44 mL/min (by C-G formula based on SCr of 0.71 mg/dL). Liver Function Tests: No results for input(s): "AST", "ALT", "ALKPHOS", "BILITOT", "PROT", "ALBUMIN" in the last 168 hours.  No results for input(s): "LIPASE", "AMYLASE" in the last 168 hours. No results for input(s): "AMMONIA" in the last 168 hours.  Coagulation Profile: No results for input(s): "INR", "PROTIME" in the last 168 hours. Cardiac Enzymes: No results for input(s): "CKTOTAL", "CKMB", "CKMBINDEX", "TROPONINI" in the last 168 hours. BNP (last 3 results) No results for input(s): "PROBNP" in the last 8760 hours. HbA1C: No results for input(s): "HGBA1C" in the last 72 hours. CBG: No results for input(s): "GLUCAP" in  the last 168 hours.  Lipid Profile: No results for input(s): "CHOL", "HDL", "LDLCALC", "TRIG", "CHOLHDL", "LDLDIRECT" in the last 72 hours. Thyroid Function Tests: No results for input(s): "TSH", "T4TOTAL", "FREET4", "T3FREE", "THYROIDAB" in the last 72 hours. Anemia Panel: No results for input(s): "VITAMINB12", "FOLATE", "FERRITIN", "TIBC", "IRON", "RETICCTPCT" in the last 72 hours. Sepsis Labs: Recent Labs  Lab 03/21/23 1053 03/24/23 0457 03/24/23 1106  PROCALCITON <0.10  --   --   LATICACIDVEN  --  2.3* 2.9*    Recent Results (from the past 240 hour(s))  Urine Culture     Status: Abnormal   Collection Time: 03/20/23  4:32 AM   Specimen: Urine, Clean Catch  Result Value Ref Range Status   Specimen Description URINE, CLEAN CATCH  Final   Special Requests NONE  Final   Culture (A)  Final    <10,000 COLONIES/mL INSIGNIFICANT GROWTH Performed at [facility], [address] [place], [place] [address]    Report Status 03/21/2023 FINAL  Final  Culture, blood (Routine X 2) w Reflex to ID Panel     Status: None (Preliminary result)   Collection Time: 03/24/23 11:06 AM   Specimen: BLOOD LEFT HAND  Result Value Ref Range Status   Specimen Description BLOOD LEFT HAND  Final   Special Requests   Final    BOTTLES DRAWN AEROBIC ONLY Blood Culture adequate volume   Culture   Final    NO GROWTH 2 DAYS Performed at [facility], [address] [place], [place] [address]    Report Status PENDING  Incomplete  Culture, blood (Routine X 2) w Reflex to ID Panel     Status: None (Preliminary result)   Collection Time: 03/24/23  1:26 PM   Specimen: BLOOD LEFT ARM  Result Value Ref Range Status   Specimen  Description BLOOD LEFT ARM  Final   Special Requests   Final    BOTTLES DRAWN AEROBIC ONLY Blood Culture results may not be optimal due to an inadequate volume of blood received in culture bottles   Culture   Final    NO GROWTH 2 DAYS Performed at [facility],  [address] [place], [place] [address]    Report Status PENDING  Incomplete      Radiology Studies: [place] EKG SITE RITE  Result Date: 03/26/2023 If Site Rite image not attached, placement could not be confirmed due to current cardiac rhythm.     Scheduled Meds:  atorvastatin  40 mg Oral Daily   Chlorhexidine Gluconate Cloth  6 each Topical Daily   feeding supplement  237 mL Oral BID BM   metoprolol tartrate  100 mg Oral BID   pantoprazole (PROTONIX) IV  40 mg Intravenous Q12H   pneumococcal 20-valent conjugate vaccine  0.5 mL Intramuscular Tomorrow-1000   sodium chloride flush  3 mL Intravenous Q12H   sucralfate  1 g Oral Q6H   Continuous Infusions:  sodium chloride 10 mL/hr at 03/25/23 1725   sodium chloride Stopped (03/20/23 0031)   sodium chloride     ceFEPime (MAXIPIME) IV 2 g (03/26/23 1122)   heparin       LOS: 13 days   Time spent: 40 minutes   Noralee Stain, DO [facility] 03/26/2023, 12:26 PM   Available via Epic secure chat 7am-7pm After these hours, please refer to coverage provider listed on [email or web address]

## 2023-03-26 NOTE — Progress Notes (Signed)
Peripherally Inserted Central Catheter Placement  The IV Nurse has discussed with the patient and/or persons authorized to consent for the patient, the purpose of this procedure and the potential benefits and risks involved with this procedure.  The benefits include less needle sticks, lab draws from the catheter, and the patient may be discharged home with the catheter. Risks include, but not limited to, infection, bleeding, blood clot (thrombus formation), and puncture of an artery; nerve damage and irregular heartbeat and possibility to perform a PICC exchange if needed/ordered by physician.  Alternatives to this procedure were also discussed.  Bard Power PICC patient education guide, fact sheet on infection prevention and patient information card has been provided to patient /or left at bedside.  Telephone consent obtained from great niece- pt sedated for procedure.  PICC Placement Documentation  PICC Double Lumen 03/26/23 Left Brachial 41 cm 0 cm (Active)  Indication for Insertion or Continuance of Line Limited venous access - need for IV therapy >5 days (PICC only);Poor Vasculature-patient has had multiple peripheral attempts or PIVs lasting less than 24 hours 03/26/23 1431  Exposed Catheter (cm) 0 cm 03/26/23 1431  Site Assessment Clean, Dry, Intact 03/26/23 1431  Lumen #1 Status Flushed;Saline locked;Blood return noted 03/26/23 1431  Lumen #2 Status Flushed;Saline locked;Blood return noted 03/26/23 1431  Dressing Type Transparent;Securing device 03/26/23 1431  Dressing Status Antimicrobial disc in place;Clean, Dry, Intact 03/26/23 1431  Line Care Connections checked and tightened 03/26/23 1431  Line Adjustment (NICU/IV Team Only) No 03/26/23 1431  Dressing Intervention New dressing 03/26/23 1431  Dressing Change Due 04/02/23 03/26/23 1431       Elliot Dally 03/26/2023, 2:32 PM

## 2023-03-26 NOTE — Anesthesia Procedure Notes (Signed)
Procedure Name: MAC Date/Time: 03/26/2023 9:13 AM  Performed by: Adria Dill, CRNAPre-anesthesia Checklist: Patient identified, Emergency Drugs available, Suction available and Patient being monitored Patient Re-evaluated:Patient Re-evaluated prior to induction Oxygen Delivery Method: Simple face mask Preoxygenation: Pre-oxygenation with 100% oxygen Induction Type: IV induction Airway Equipment and Method: Bite block Placement Confirmation: positive ETCO2 and breath sounds checked- equal and bilateral Dental Injury: Teeth and Oropharynx as per pre-operative assessment

## 2023-03-27 DIAGNOSIS — I70221 Atherosclerosis of native arteries of extremities with rest pain, right leg: Secondary | ICD-10-CM | POA: Diagnosis not present

## 2023-03-27 LAB — BASIC METABOLIC PANEL
Anion gap: 8 (ref 5–15)
BUN: 16 mg/dL (ref 8–23)
CO2: 27 mmol/L (ref 22–32)
Calcium: 8.5 mg/dL — ABNORMAL LOW (ref 8.9–10.3)
Chloride: 99 mmol/L (ref 98–111)
Creatinine, Ser: 0.74 mg/dL (ref 0.44–1.00)
GFR, Estimated: >60 mL/min (ref >60)
Glucose, Bld: 110 mg/dL — ABNORMAL HIGH (ref 70–99)
Potassium: 3.6 mmol/L (ref 3.5–5.1)
Sodium: 134 mmol/L — ABNORMAL LOW (ref 135–145)

## 2023-03-27 LAB — CBC
HCT: 35 % — ABNORMAL LOW (ref 36.0–46.0)
Hemoglobin: 11.3 g/dL — ABNORMAL LOW (ref 12.0–15.0)
MCH: 31.8 pg (ref 26.0–34.0)
MCHC: 32.3 g/dL (ref 30.0–36.0)
MCV: 98.6 fL (ref 80.0–100.0)
Platelets: 285 10*3/uL (ref 150–400)
RBC: 3.55 MIL/uL — ABNORMAL LOW (ref 3.87–5.11)
RDW: 15 % (ref 11.5–15.5)
WBC: 12.5 10*3/uL — ABNORMAL HIGH (ref 4.0–10.5)
nRBC: 0.2 % (ref 0.0–0.2)

## 2023-03-27 LAB — HEPARIN LEVEL (UNFRACTIONATED): Heparin Unfractionated: >1.1 [IU]/mL — ABNORMAL HIGH (ref 0.30–0.70)

## 2023-03-27 LAB — APTT
aPTT: 120 s — ABNORMAL HIGH (ref 24–36)
aPTT: 51 s — ABNORMAL HIGH (ref 24–36)

## 2023-03-27 LAB — MAGNESIUM: Magnesium: 2 mg/dL (ref 1.7–2.4)

## 2023-03-27 MED ORDER — HEPARIN (PORCINE) 25000 UT/250ML-% IV SOLN
600.0000 [IU]/h | INTRAVENOUS | Status: AC
  Administered 2023-03-28: 600 [IU]/h via INTRAVENOUS
  Filled 2023-03-27: qty 250

## 2023-03-27 MED ORDER — AMIODARONE HCL 200 MG PO TABS
200.0000 mg | ORAL_TABLET | Freq: Two times a day (BID) | ORAL | Status: DC
  Administered 2023-03-27 – 2023-03-30 (×7): 200 mg via ORAL
  Filled 2023-03-27 (×7): qty 1

## 2023-03-27 MED ORDER — AMIODARONE HCL 200 MG PO TABS
200.0000 mg | ORAL_TABLET | Freq: Every day | ORAL | Status: DC
  Administered 2023-03-27: 200 mg via ORAL
  Filled 2023-03-27: qty 1

## 2023-03-27 NOTE — Progress Notes (Signed)
PROGRESS NOTE    Sarah Wolf  ZOX:[id number] DOB: 04/01/1935 DOA: 03/13/2023 PCP: Tally Joe, MD     Brief Narrative:  Sarah Wolf is a 88 y.o. female with past medical history significant for HTN, paroxysmal atrial fibrillation, history of DVT no longer on anticoagulation who presents to [facility] ED on 8/26 with complaint of bilateral lower extremity pain, greatest on right.  Onset morning of admission, pain all the way down her right leg severe nature.  Review of records note patient with left lower extremity DVT 2020 as well as echo noting A-fib at that time.  She was on Xarelto temporarily after being found to have a LLE DVT following a fall with femur fracture 03/08/2019.  At baseline lives alone, ambulatory with a cane.  Patient denied chest pain, no palpitations, no shortness of breath, no cough, no nausea/vomiting/diarrhea, no lower extremity edema.  And route with EMS patient was noted to be in A-fib with RVR.   In the emergency department patient was noted to be afebrile with pulse elevated up to 145 and atrial fibrillation, systolic blood pressures noted to be elevated into the 160s., and O2 saturations currently maintained on 1.5 L nasal cannula oxygen.  Labs noted sodium 133, potassium 3.5, chloride 93, TSH 2.94 , BNP 799.9, and high-sensitivity troponins 55->57.  Doppler ultrasounds of the lower extremity did not note any signs of a DVT, but did note occlusion of the proximal to mid SFA.  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  As a surgery have been formally consulted.  Patient had been started on heparin drip and given metoprolol IV intermittently.  Cardiology and vascular surgery were consulted.  TRH  consulted for admission for further evaluation and management of A-fib with RVR and acute occlusion of right SFA and left popliteal artery.   Significant Hospital events: 8/26 admitted to [facility] 8/28 thrombolysis of RLE with SFA stent, worsening encephalopathy and possible aspiration event leading to worsening hypoxia; pccm consulted and required intubation; Tx to ICU.  8/30: Extubated 9/1: transferred back to hospitalist service; pending SNF placement 9/6: coffee ground emesis, GI consulted  9/7: EGD deferred, dx PNA and started on cefepime  9/8: EGD found large hiatal hernia, severe circumferential esophagitis with ulceration without bleeding.  Suspected long segment of Barrett's esophagus, gastritis, nonbleeding duodenal ulcers.  Has multiple sites of possible bleeding, most likely from esophageal ulceration, duodenal ulcers in setting of anticoagulation.  Continue medical therapy with PPI.  Resume anticoagulation with heparin drip  New events last 24 hours / Subjective: No new issues overnight.  Hemoglobin remains stable.  Discussed with her our recommendation for SNF placement  Assessment & Plan:   Principal Problem:   Critical limb ischemia of right lower extremity (HCC) Active Problems:   Atrial fibrillation with RVR (HCC)   Thrombus of right atrial appendage   Chronic HFpEF   Myocardial injury due to Afib   History of DVT (deep vein thrombosis)   Lesion of mediastinum   Hyponatremia   Acute respiratory failure with hypoxia (HCC)   Goals of care, counseling/discussion   Aspiration into airway   Upper GI bleed   Anticoagulated   Acute esophagitis   Gastritis and gastroduodenitis   Duodenal ulcer    Critical  limb ischemia right lower extremity likely secondary to cardioembolic event Pain of pain to lower legs, acutely.  CT angiogram notable for acute occlusion right SFA and left popliteal artery.  Vascular surgery was consulted and patient underwent right lower extremity  angiogram/aortogram with peripheral thrombectomy with SFA stent.   -- Follow-up 4-6 weeks vascular surgery with RLE duplex/ABIs -- Aspirin and Eliquis are on hold due to GI bleed --> started IV heparin and will monitor GIB  Upper GI bleed -- FOBT+ -- Hold aspirin and Eliquis --> started IV heparin and will monitor GIB -- EGD found large hiatal hernia, severe circumferential esophagitis with ulceration without bleeding.  Suspected long segment of Barrett's esophagus, gastritis, nonbleeding duodenal ulcers.  Has multiple sites of possible bleeding, most likely from esophageal ulceration, duodenal ulcers in setting of anticoagulation.  Continue medical therapy with PPI.  Resume anticoagulation with heparin drip -- PPI IV, carafate  -- Hgb stable today   Sepsis, not POA, secondary to HCAP -- Worsening WBC with productive cough -- Blood cx negative to date -- UA negative  -- Cefepime started 9/7  -- WBC improving    Acute respiratory failure with hypoxia Suspect aspiration event Following aortogram with thrombectomy, patient has suspected aspiration event and developed acute respiratory failure with hypoxia.  Patient required intubation and transferred to the intensive care unit.  Patient was successfully extubated on 03/17/2023.  Tracheal aspirate with normal respiratory flora, MRSA PCR negative.  Completed 7-day course of antibiotics with Unasyn followed by ceftriaxone. -- Incentive spirometry -- Continue supplemental oxygen, maintain SpO2 > 90%   Acute encephalopathy: Resolved Etiology likely secondary to respiratory failure with hypoxia/hypercarbia after aspiration event as above.  CT head negative.  EEG with no seizure or epileptiform discharges noted.  Ammonia level within normal limits. -- Supportive care   Paroxysmal atrial fibrillation with RVR Previously noted to be in A-fib in 2020, but not on anticoagulation.  Given new embolic event, now will likely need to be on lifelong  anticoagulation.  Cardiology avoiding cardioversion in the setting of possible LAA appendage thrombus. -- Cardiology following, appreciate assistance -- Metoprolol  -- Eliquis 5 mg p.o. twice daily --> started IV heparin and will monitor GIB -- Start amiodarone today  Chronic systolic congestive heart failure TTE with LVEF 40-45%, endocardial border not optimally defined to evaluate for regional wall motion abnormalities.  Seen by cardiology and now signed off. -- Metoprolol    Hx DVT -- Eliquis 5 mg p.o. twice daily --> started IV heparin and will monitor GIB   Type 2 diabetes mellitus Hemoglobin A1c 6.6.  Diet controlled.   Urinary retention Required Foley catheter, now resolved. -- Monitor urine output   Lesion of mediastinum Noted incidental finding on CT chest of nodule anterior mediastinum -- Outpatient follow-up with CT surgery   Weakness/debility/deconditioning: PT/OT recommending SNF placement. -- TOC consulted  CKD stage IIIa -- Stable  Large diaphragmatic hernia -- Seen on chest x-ray 9/3 -- Could consider outpatient general surgery consultation if surgical intervention is desired once her acute illness is stable  Poor perfusion of her upper extremities -- Her fingers bilaterally seem dusky on examination.  This has been ongoing since last week per niece.  Vascular duplex of upper extremities showing no significant arterial obstruction bilaterally.  Did have severely dampened waveforms in all 10 digits -- ?Raynaud's. Left hand appearance improved.     DVT prophylaxis: Eliquis on hold --> started IV heparin and will monitor GIB  Code Status: DNR, no  CPR if pulseless, DNI Family Communication: Niece over the phone Disposition Plan: SNF, [name]  Status is: Inpatient Remains inpatient appropriate because: Not medically ready for discharge, remains on IV heparin    Antimicrobials:  Anti-infectives (From admission, onward)    Start     Dose/Rate Route  Frequency Ordered Stop   03/25/23 0945  ceFEPIme (MAXIPIME) 2 g in sodium chloride 0.9 % 100 mL IVPB        2 g 200 mL/hr over 30 Minutes Intravenous Every 12 hours 03/25/23 0853     03/24/23 0500  Ampicillin-Sulbactam (UNASYN) 3 g in sodium chloride 0.9 % 100 mL IVPB  Status:  Discontinued        3 g 200 mL/hr over 30 Minutes Intravenous Every 8 hours 03/24/23 0420 03/25/23 0853   03/18/23 0830  cefTRIAXone (ROCEPHIN) 2 g in sodium chloride 0.9 % 100 mL IVPB        2 g 200 mL/hr over 30 Minutes Intravenous Every 24 hours 03/18/23 0731 03/21/23 0952   03/15/23 2330  Ampicillin-Sulbactam (UNASYN) 3 g in sodium chloride 0.9 % 100 mL IVPB  Status:  Discontinued        3 g 200 mL/hr over 30 Minutes Intravenous Every 6 hours 03/15/23 2239 03/18/23 0731        Objective: Vitals:   03/27/23 0000 03/27/23 0200 03/27/23 0400 03/27/23 0513  BP: 114/85 (!) 128/98 121/77   Pulse: 97 87 93   Resp: 12 19 12    Temp:   98 F (36.7 C)   TempSrc:   Oral   SpO2: 100% 100% 100%   Weight:    63.7 kg  Height:        Intake/Output Summary (Last 24 hours) at 03/27/2023 1013 Last data filed at 03/26/2023 2334 Gross per 24 hour  Intake 1208.03 ml  Output 800 ml  Net 408.03 ml   Filed Weights   03/25/23 0529 03/26/23 0624 03/27/23 0513  Weight: 64.4 kg 62.8 kg 63.7 kg   Examination: General exam: Appears calm and comfortable  Respiratory system: Clear to auscultation anteriorly, rhonchi left lower base Cardiovascular system: S1 & S2 heard, rate 100s.  Irregular rhythm no pedal edema. Gastrointestinal system: Abdomen is nondistended, soft and nontender. Normal bowel sounds heard. Central nervous system: Alert and oriented. Non focal exam. Speech clear  Extremities: Symmetric in appearance bilaterally  Psychiatry: Judgement and insight appear stable. Mood & affect appropriate.     Data Reviewed: I have personally reviewed following labs and imaging studies  CBC: Recent Labs  Lab  03/23/23 1025 03/24/23 1035 03/24/23 1326 03/25/23 0052 03/25/23 0717 03/25/23 1219 03/26/23 0333 03/27/23 0710  WBC 14.4* 18.3*  --   --  24.6*  --  14.7* 12.5*  NEUTROABS  --   --   --   --   --   --  12.4*  --   HGB 12.0 13.4   < > 11.0* 11.4* 10.9* 10.7* 11.3*  HCT 36.7 40.0   < > 34.4* 35.0* 33.4* 32.8* 35.0*  MCV 95.6 96.2  --   --  95.9  --  99.1 98.6  PLT 307 238  --   --  300  --  275 285   < > = values in this interval not displayed.   Basic Metabolic Panel: Recent Labs  Lab 03/23/23 1025 03/24/23 1035 03/25/23 0717 03/26/23 0333 03/27/23 0710  NA 133* 139 135 137 134*  K 4.2 4.1 4.2 3.0* 3.6  CL  101 96* 96* 99 99  CO2 22 25 27 29 27   GLUCOSE 109* 108* 103* 114* 110*  BUN 31* 26* 18 18 16   CREATININE 1.03* 0.94 0.75 0.71 0.74  CALCIUM 8.7* 9.0 8.2* 8.5* 8.5*  MG 2.1  --   --   --  2.0  PHOS 3.3  --   --   --   --    GFR: Estimated Creatinine Clearance: 44.2 mL/min (by C-G formula based on SCr of 0.74 mg/dL). Liver Function Tests: No results for input(s): "AST", "ALT", "ALKPHOS", "BILITOT", "PROT", "ALBUMIN" in the last 168 hours.  No results for input(s): "LIPASE", "AMYLASE" in the last 168 hours. No results for input(s): "AMMONIA" in the last 168 hours.  Coagulation Profile: No results for input(s): "INR", "PROTIME" in the last 168 hours. Cardiac Enzymes: No results for input(s): "CKTOTAL", "CKMB", "CKMBINDEX", "TROPONINI" in the last 168 hours. BNP (last 3 results) No results for input(s): "PROBNP" in the last 8760 hours. HbA1C: No results for input(s): "HGBA1C" in the last 72 hours. CBG: No results for input(s): "GLUCAP" in the last 168 hours.  Lipid Profile: No results for input(s): "CHOL", "HDL", "LDLCALC", "TRIG", "CHOLHDL", "LDLDIRECT" in the last 72 hours. Thyroid Function Tests: No results for input(s): "TSH", "T4TOTAL", "FREET4", "T3FREE", "THYROIDAB" in the last 72 hours. Anemia Panel: No results for input(s): "VITAMINB12", "FOLATE",  "FERRITIN", "TIBC", "IRON", "RETICCTPCT" in the last 72 hours. Sepsis Labs: Recent Labs  Lab 03/21/23 1053 03/24/23 0457 03/24/23 1106  PROCALCITON <0.10  --   --   LATICACIDVEN  --  2.3* 2.9*    Recent Results (from the past 240 hour(s))  Urine Culture     Status: Abnormal   Collection Time: 03/20/23  4:32 AM   Specimen: Urine, Clean Catch  Result Value Ref Range Status   Specimen Description URINE, CLEAN CATCH  Final   Special Requests NONE  Final   Culture (A)  Final    <10,000 COLONIES/mL INSIGNIFICANT GROWTH Performed at [facility], [address], [place], [place] [address]    Report Status 03/21/2023 FINAL  Final  Culture, blood (Routine X 2) w Reflex to ID Panel     Status: None (Preliminary result)   Collection Time: 03/24/23 11:06 AM   Specimen: BLOOD LEFT HAND  Result Value Ref Range Status   Specimen Description BLOOD LEFT HAND  Final   Special Requests   Final    BOTTLES DRAWN AEROBIC ONLY Blood Culture adequate volume   Culture   Final    NO GROWTH 3 DAYS Performed at [facility], [address] [place], [place] [address]    Report Status PENDING  Incomplete  Culture, blood (Routine X 2) w Reflex to ID Panel     Status: None (Preliminary result)   Collection Time: 03/24/23  1:26 PM   Specimen: BLOOD LEFT ARM  Result Value Ref Range Status   Specimen Description BLOOD LEFT ARM  Final   Special Requests   Final    BOTTLES DRAWN AEROBIC ONLY Blood Culture results may not be optimal due to an inadequate volume of blood received in culture bottles   Culture   Final    NO GROWTH 3 DAYS Performed at [facility], [address] [place], [place] [address]    Report Status PENDING  Incomplete      Radiology Studies: DG CHEST PORT 1 VIEW  Result Date: 03/26/2023 CLINICAL DATA:  PICC line placement. EXAM: PORTABLE CHEST 1  VIEW COMPARISON:  March 24, 2023 FINDINGS: Left PICC line terminates at the expected location of the superior vena  cava. No evidence of pneumothorax. Enlarged cardiac silhouette. Elevation of the left hemidiaphragm. Small right pleural effusion. Consolidation versus atelectasis in the left lower lobe. IMPRESSION: 1. Left PICC line terminates at the expected location of the superior vena cava. 2. Small right pleural effusion. 3. Consolidation versus atelectasis in the left lower lobe. Electronically Signed   By: Ted Mcalpine M.D.   On: 03/26/2023 15:34   [place] EKG SITE RITE  Result Date: 03/26/2023 If Site Rite image not attached, placement could not be confirmed due to current cardiac rhythm.     Scheduled Meds:  atorvastatin  40 mg Oral Daily   Chlorhexidine Gluconate Cloth  6 each Topical Daily   feeding supplement  237 mL Oral BID BM   metoprolol tartrate  100 mg Oral BID   pantoprazole (PROTONIX) IV  40 mg Intravenous Q12H   pneumococcal 20-valent conjugate vaccine  0.5 mL Intramuscular Tomorrow-1000   sodium chloride flush  10-40 mL Intracatheter Q12H   sodium chloride flush  3 mL Intravenous Q12H   sucralfate  1 g Oral Q6H   Continuous Infusions:  sodium chloride 10 mL/hr at 03/26/23 2334   sodium chloride Stopped (03/20/23 0031)   sodium chloride     ceFEPime (MAXIPIME) IV Stopped (03/26/23 2156)   heparin 700 Units/hr (03/26/23 2334)     LOS: 14 days   Time spent: 25 minutes   Noralee Stain, DO [facility] 03/27/2023, 10:13 AM   Available via Epic secure chat 7am-7pm After these hours, please refer to coverage provider listed on [email or web address]

## 2023-03-27 NOTE — Plan of Care (Signed)
  Problem: Education: Goal: Knowledge of disease or condition will improve 03/27/2023 1540 by Genevie Ann, RN Outcome: Progressing 03/27/2023 1540 by Genevie Ann, RN Outcome: Progressing Goal: Understanding of medication regimen will improve 03/27/2023 1540 by Genevie Ann, RN Outcome: Progressing 03/27/2023 1540 by Genevie Ann, RN Outcome: Progressing Goal: Individualized Educational Video(s) Outcome: Progressing

## 2023-03-27 NOTE — TOC Initial Note (Signed)
Transition of Care [facility]) - Initial/Assessment Note    Patient Details  Name: Sarah Wolf MRN: [id number] Date of Birth: [birth year]  Transition of Care [facility]) CM/SW Contact:    Eduard Roux, LCSW Phone Number: 03/27/2023, 11:55 AM  Clinical Narrative:                  Received message from MD of anticipated d/c 2-3 days. [facility]ed [name], SNF confirmed availability.   CSW will start auth once patient is seen by PT.   TOC will continue to follow and assist with discharge planning.   Antony Blackbird, MSW, LCSW Clinical Social Worker    Expected Discharge Plan: Skilled Nursing Facility Barriers to Discharge: Continued Medical Work up   Patient Goals and CMS Choice Patient states their goals for this hospitalization and ongoing recovery are:: SNF   Choice offered to / list presented to : Patient      Expected Discharge Plan and Services In-house Referral: Clinical Social Work Discharge Planning Services: CM Consult   Living arrangements for the past 2 months: Single Family Home                   DME Agency: NA                  Prior Living Arrangements/Services Living arrangements for the past 2 months: Single Family Home Lives with:: Self Patient language and need for interpreter reviewed:: Yes Do you feel safe going back to the place where you live?: No   SNF  Need for Family Participation in Patient Care: Yes (Comment) Care giver support system in place?: Yes (comment)   Criminal Activity/Legal Involvement Pertinent to Current Situation/Hospitalization: No - Comment as needed  Activities of Daily Living Home Assistive Devices/Equipment: Cane (specify quad or straight), Walker (specify type) ADL Screening (condition at time of admission) Patient's cognitive ability adequate to safely complete daily activities?: Yes Is the patient deaf or have difficulty hearing?: No Does the patient have difficulty seeing, even when wearing glasses/contacts?:  No Does the patient have difficulty concentrating, remembering, or making decisions?: Yes Patient able to express need for assistance with ADLs?: Yes Does the patient have difficulty dressing or bathing?: Yes Independently performs ADLs?: No Communication: Independent Dressing (OT): Needs assistance Is this a change from baseline?: Change from baseline, expected to last <3days Grooming: Independent Feeding: Independent Bathing: Independent Toileting: Needs assistance Is this a change from baseline?: Change from baseline, expected to last <3 days In/Out Bed: Needs assistance Is this a change from baseline?: Change from baseline, expected to last <3 days Walks in Home: Independent with device (comment) (cane) Does the patient have difficulty walking or climbing stairs?: No Weakness of Legs: Right Weakness of Arms/Hands: None  Permission Sought/Granted Permission sought to share information with : Case Manager, Magazine features editor, Family Supports Permission granted to share information with : Yes, Verbal Permission Granted  Share Information with NAME: Bud  Permission granted to share info w AGENCY: SNF  Permission granted to share info w Relationship: nephew  Permission granted to share info w Contact Information: Bud [phone]  Emotional Assessment Appearance:: Appears stated age Attitude/Demeanor/Rapport: Gracious Affect (typically observed): Calm Orientation: : Oriented to Situation, Oriented to Self Alcohol / Substance Use: Not Applicable Psych Involvement: No (comment)  Admission diagnosis:  Popliteal artery occlusion, 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] Atrial fibrillation with RVR (HCC) [I48.91] Superficial femoral artery occlusion (HCC) [I70.209] Atrial fibrillation, unspecified type [facility]) [I48.91] Patient Active Problem List   Diagnosis  Date Noted   Acute esophagitis 03/26/2023   Gastritis and gastroduodenitis 03/26/2023   Duodenal ulcer 03/26/2023   Upper GI  bleed 03/24/2023   Anticoagulated 03/24/2023   Aspiration into airway 03/17/2023   Acute respiratory failure with hypoxia (HCC) 03/15/2023   Goals of care, counseling/discussion 03/15/2023   Atrial fibrillation with RVR (HCC) 03/13/2023   Critical limb ischemia of right lower extremity (HCC) 03/13/2023   Thrombus of right atrial appendage 03/13/2023   Myocardial injury due to Afib 03/13/2023   Chronic HFpEF 03/13/2023   History of DVT (deep vein thrombosis) 03/13/2023   Lesion of mediastinum 03/13/2023   Hyponatremia 03/13/2023   Pain due to onychomycosis of toenails of both feet 01/06/2023   Hip fracture (HCC) 03/08/2019   Closed comminuted intra-articular fracture of distal femur, right, initial encounter (HCC)    Closed displaced supracondylar fracture with intracondylar extension of lower end of left femur (HCC)    PCP:  Tally Joe, MD Pharmacy:   [facility] [address]), [place] - [address] [place] ([place]) [place] [address] Phone: [phone] Fax: [phone]  [facility] [address] [place] [address] Phone: [phone] Fax: [phone]     Social Determinants of Health (SDOH) Social History: SDOH Screenings   Food Insecurity: No Food Insecurity (03/13/2023)  Housing: Medium Risk (03/13/2023)  Transportation Needs: No Transportation Needs (03/13/2023)  Utilities: Not At Risk (03/13/2023)  Tobacco Use: Low Risk  (03/26/2023)   SDOH Interventions:     Readmission Risk Interventions     No data to display

## 2023-03-27 NOTE — Progress Notes (Signed)
ANTICOAGULATION CONSULT NOTE - Follow Up Consult  Pharmacy Consult for Heparin Indication:  PAD but with upper GI bleed  No Known Allergies  Patient Measurements: Height: 5\' 2"  (157.5 cm) Weight: 63.7 kg (140 lb 6.9 oz) IBW/kg (Calculated) : 50.1 Heparin Dosing Weight: 63.7 kg  Vital Signs: Temp: 97.3 F (36.3 C) (09/09 1729) Temp Source: Oral (09/09 1729) BP: 114/95 (09/09 1729) Pulse Rate: 105 (09/09 1729)  Labs: Recent Labs    03/25/23 0717 03/25/23 1219 03/26/23 0333 03/26/23 2113 03/27/23 0710 03/27/23 1941  HGB 11.4* 10.9* 10.7*  --  11.3*  --   HCT 35.0* 33.4* 32.8*  --  35.0*  --   PLT 300  --  275  --  285  --   APTT  --   --   --  159* 120* 51*  HEPARINUNFRC  --   --   --  >1.10* >1.10*  --   CREATININE 0.75  --  0.71  --  0.74  --     Estimated Creatinine Clearance: 44.2 mL/min (by C-G formula based on SCr of 0.74 mg/dL).  Assessment: Patient presented with acute on chronic critical limb ischemia in her right lower extremity. Initially she was initiated on Eliquis, however then developed coffee ground emesis and dark stools on 9/6, so both Eliquis and aspirin were held due to concern for upper GI bleed. She was set to have an EGD on 9/7, but this was postponed due to worsening renal function and she was started on IV PPI BID.    Successfully underwent EGD on 9/8 and no active bleeds were found, likely stopped with PPI use. Due to need for anticoagulation in setting of critical limb ischemia, pharmacy has been consulted to initiate heparin infusion.    Last Eliquis dose was 9/5 at 2147.   -aPTT= 51 after holding heparin for 30 minutes and infusion reduction to 500 units/hr   Goal of Therapy:  Heparin level 0.3-0.5 units/ml aPTT 66-85 seconds Monitor platelets by anticoagulation protocol: Yes   Plan:  -Increase heparin to 600 units/hr -aPTT and heparin level in 8 hrs  Harland German, PharmD Clinical Pharmacist **Pharmacist phone directory can now be  found on [email or web address] ([email or web address]).  Listed under [facility].

## 2023-03-27 NOTE — Anesthesia Postprocedure Evaluation (Signed)
Anesthesia Post Note  Patient: Shonice Widmer  Procedure(s) Performed: ESOPHAGOGASTRODUODENOSCOPY (EGD) WITH PROPOFOL BIOPSY     Patient location during evaluation: PACU Anesthesia Type: MAC Level of consciousness: awake and alert Pain management: pain level controlled Vital Signs Assessment: post-procedure vital signs reviewed and stable Respiratory status: spontaneous breathing, nonlabored ventilation, respiratory function stable and patient connected to nasal cannula oxygen Cardiovascular status: stable and blood pressure returned to baseline Postop Assessment: no apparent nausea or vomiting Anesthetic complications: no   No notable events documented.  Last Vitals:  Vitals:   03/26/23 1957 03/26/23 2000  BP: (!) 119/92 133/78  Pulse: (!) 107 100  Resp: 14 17  Temp: 36.6 C   SpO2: 100% 100%    Last Pain:  Vitals:   03/26/23 2212  TempSrc:   PainSc: 0-No pain                 Collene Schlichter

## 2023-03-27 NOTE — Progress Notes (Signed)
Physical Therapy Treatment Patient Details Name: Sarah Wolf MRN: [id number] DOB: August 15, 1934 Today's Date: 03/27/2023   History of Present Illness Pt is 88 year old who presents to [facility] on 8/26 with complaint of bilateral lower extremity pain, greatest on right. Pt with critical limb ischemia RLE.  8/28 patient underwent thrombolysis of RLE. Worsening encephalopathy post procedure.Aspiration event leading to hypoxia and worsening AMS.  Intubated 8/28- 8/30 PMH - HTN, paroxysmal atrial fibrillation, history of DVT no longer on anticoagulation    PT Comments  Patient progressing with hallway ambulation.  Initially in recliner and mild wheezing noted on 2L O2.  Patient ambulating with min A for balance, walker proximity/safety and cues for posture.  She seemed less wheezy up walking.  She was fatigued up for about 2 hours this am in recliner.  Assisted back to bed with nephew still visiting.  She will benefit from continued skilled PT at d/c with recommendations for SNF.  PT will continue to follow during acute stay.     If plan is discharge home, recommend the following: A little help with walking and/or transfers;A little help with bathing/dressing/bathroom;Help with stairs or ramp for entrance;Assist for transportation;Direct supervision/assist for medications management;Assistance with cooking/housework   Can travel by private vehicle     Yes  Equipment Recommendations  Rolling walker (2 wheels)    Recommendations for Other Services       Precautions / Restrictions Precautions Precautions: Fall Precaution Comments: Active humeral fx to R arm     Mobility  Bed Mobility Overal bed mobility: Needs Assistance Bed Mobility: Sit to Supine       Sit to supine: Contact guard assist   General bed mobility comments: for lines, positioning; pt able to lift her legs into bed    Transfers Overall transfer level: Needs assistance Equipment used: Rolling walker (2 wheels) Transfers: Sit  to/from Stand Sit to Stand: Min assist           General transfer comment: cues for hand placement, assist for safety, balance    Ambulation/Gait Ambulation/Gait assistance: Contact guard assist Gait Distance (Feet): 140 Feet Assistive device: Rolling walker (2 wheels) Gait Pattern/deviations: Step-to pattern, Step-through pattern, Decreased stride length, Wide base of support, Trunk flexed       General Gait Details: cues for posture, proximity to walker, mild antalgia on the L with c/o previous foot issue   Stairs             Wheelchair Mobility     Tilt Bed    Modified Rankin (Stroke Patients Only)       Balance Overall balance assessment: Needs assistance   Sitting balance-Leahy Scale: Fair     Standing balance support: Bilateral upper extremity supported, Reliant on assistive device for balance Standing balance-Leahy Scale: Poor                              Cognition Arousal: Alert Behavior During Therapy: WFL for tasks assessed/performed Overall Cognitive Status: Within Functional Limits for tasks assessed                                 General Comments: not formally tested, following commands well, conversing well as nephew visiting and he reports she is usually "fast" likes race cars, etc        Exercises General Exercises - Lower Extremity Ankle Circles/Pumps: AROM, Both, 10  reps, Seated Long Arc Quad: AROM, 5 reps, Both, Seated    General Comments General comments (skin integrity, edema, etc.): HR max 135, maintains in 100's throughout, on 2L O2 SpO2 99-100%      Pertinent Vitals/Pain Pain Assessment Faces Pain Scale: Hurts a little bit Pain Location: L foot with weight bearing (wearing socks) Pain Descriptors / Indicators: Discomfort Pain Intervention(s): Monitored during session    Home Living                          Prior Function            PT Goals (current goals can now be found in  the care plan section) Progress towards PT goals: Progressing toward goals    Frequency    Min 1X/week      PT Plan      Co-evaluation              AM-PAC PT "6 Clicks" Mobility   Outcome Measure  Help needed turning from your back to your side while in a flat bed without using bedrails?: A Little Help needed moving from lying on your back to sitting on the side of a flat bed without using bedrails?: A Little Help needed moving to and from a bed to a chair (including a wheelchair)?: A Little Help needed standing up from a chair using your arms (e.g., wheelchair or bedside chair)?: A Little Help needed to walk in hospital room?: A Little Help needed climbing 3-5 steps with a railing? : Total 6 Click Score: 16    End of Session Equipment Utilized During Treatment: Gait belt Activity Tolerance: Patient tolerated treatment well Patient left: in bed;with call bell/phone within reach;with bed alarm set;with family/visitor present   PT Visit Diagnosis: Other abnormalities of gait and mobility (R26.89);Muscle weakness (generalized) (M62.81)     Time: 1191-4782 PT Time Calculation (min) (ACUTE ONLY): 25 min  Charges:    $Gait Training: 8-22 mins $Therapeutic Activity: 8-22 mins PT General Charges $$ ACUTE PT VISIT: 1 Visit                     Sheran Lawless, PT Acute Rehabilitation Services Office:[phone] 03/27/2023    Elray Mcgregor 03/27/2023, 12:30 PM

## 2023-03-27 NOTE — Progress Notes (Signed)
ANTICOAGULATION CONSULT NOTE - Follow Up Consult  Pharmacy Consult for Heparin Indication:  PAD but with upper GI bleed  No Known Allergies  Patient Measurements: Height: 5\' 2"  (157.5 cm) Weight: 63.7 kg (140 lb 6.9 oz) IBW/kg (Calculated) : 50.1 Heparin Dosing Weight: 63.7 kg  Vital Signs: Temp: 98.8 F (37.1 C) (09/09 0830) Temp Source: Oral (09/09 1044) BP: 144/97 (09/09 1044) Pulse Rate: 102 (09/09 1044)  Labs: Recent Labs    03/25/23 0717 03/25/23 1219 03/26/23 0333 03/26/23 2113 03/27/23 0710  HGB 11.4* 10.9* 10.7*  --  11.3*  HCT 35.0* 33.4* 32.8*  --  35.0*  PLT 300  --  275  --  285  APTT  --   --   --  159* 120*  HEPARINUNFRC  --   --   --  >1.10* >1.10*  CREATININE 0.75  --  0.71  --  0.74    Estimated Creatinine Clearance: 44.2 mL/min (by C-G formula based on SCr of 0.74 mg/dL).  Assessment: Patient presented with acute on chronic critical limb ischemia in her right lower extremity. Initially she was initiated on Eliquis, however then developed coffee ground emesis and dark stools on 9/6, so both Eliquis and aspirin were held due to concern for upper GI bleed. She was set to have an EGD on 9/7, but this was postponed due to worsening renal function and she was started on IV PPI BID.    Successfully underwent EGD on 9/8 and no active bleeds were found, likely stopped with PPI use. Due to need for anticoagulation in setting of critical limb ischemia, pharmacy has been consulted to initiate heparin infusion.    Last Eliquis dose was 9/5 at 2147.   Initial heparin level and aPTT were supratherapeutic; drip was held x 1 hr then resumed at lower rate. Heparin level remains elevated (>1.10) and aPTT also supratherapeutic (120 seconds) on 700 units/hr. RN reports some oozing from IV site. Unclear if heparin levels are falsely elevated due to recent Eliquis use or due to sensitive to current doses. CBC stable. Low therapeutic goals.  Goal of Therapy:  Heparin level  0.3-0.5 units/ml aPTT 66-85 seconds Monitor platelets by anticoagulation protocol: Yes   Plan:  Hold heparin drip x 30 minutes. Then resume heparin drip at 500 units/hr. aPTT ~8 hrs after drip resumes. Daily heparin level, aPTT and CBC. Monitor for signs/symptoms of bleeding. Eliquis on hold.  Dennie Fetters, RPh 03/27/2023,11:26 AM

## 2023-03-28 ENCOUNTER — Encounter: Payer: Self-pay | Admitting: Gastroenterology

## 2023-03-28 DIAGNOSIS — I70221 Atherosclerosis of native arteries of extremities with rest pain, right leg: Secondary | ICD-10-CM | POA: Diagnosis not present

## 2023-03-28 LAB — CBC
HCT: 25.5 % — ABNORMAL LOW (ref 36.0–46.0)
Hemoglobin: 8.2 g/dL — ABNORMAL LOW (ref 12.0–15.0)
MCH: 31.8 pg (ref 26.0–34.0)
MCHC: 32.2 g/dL (ref 30.0–36.0)
MCV: 98.8 fL (ref 80.0–100.0)
Platelets: 230 10*3/uL (ref 150–400)
RBC: 2.58 MIL/uL — ABNORMAL LOW (ref 3.87–5.11)
RDW: 14.9 % (ref 11.5–15.5)
WBC: 9.9 10*3/uL (ref 4.0–10.5)
nRBC: 0 % (ref 0.0–0.2)

## 2023-03-28 LAB — HEPARIN LEVEL (UNFRACTIONATED)
Heparin Unfractionated: 0.36 [IU]/mL (ref 0.30–0.70)
Heparin Unfractionated: 0.37 [IU]/mL (ref 0.30–0.70)

## 2023-03-28 LAB — APTT
aPTT: 100 s — ABNORMAL HIGH (ref 24–36)
aPTT: 69 s — ABNORMAL HIGH (ref 24–36)

## 2023-03-28 LAB — HEMOGLOBIN AND HEMATOCRIT, BLOOD
HCT: 32.7 % — ABNORMAL LOW (ref 36.0–46.0)
Hemoglobin: 10.5 g/dL — ABNORMAL LOW (ref 12.0–15.0)

## 2023-03-28 LAB — SURGICAL PATHOLOGY

## 2023-03-28 NOTE — TOC Progression Note (Signed)
Transition of Care [facility]) - Progression Note    Patient Details  Name: Sarah Wolf MRN: [id number] Date of Birth: [birth year]  [facility] CM/SW Contact  Eduard Roux, [place] Phone Number: 03/28/2023, 3:53 PM  Clinical Narrative:     Patient's niece requested to follow up with [facility]- per SNF they have no beds available at this time. Family was updated.  Antony Blackbird, MSW, LCSW Clinical Social Worker    Expected Discharge Plan: Skilled Nursing Facility Barriers to Discharge: Continued Medical Work up  Expected Discharge Plan and Services In-house Referral: Clinical Social Work Discharge Planning Services: CM Consult   Living arrangements for the past 2 months: Single Family Home                   DME Agency: NA                   Social Determinants of Health (SDOH) Interventions SDOH Screenings   Food Insecurity: No Food Insecurity (03/13/2023)  Housing: Medium Risk (03/13/2023)  Transportation Needs: No Transportation Needs (03/13/2023)  Utilities: Not At Risk (03/13/2023)  Tobacco Use: Low Risk  (03/26/2023)    Readmission Risk Interventions     No data to display

## 2023-03-28 NOTE — Plan of Care (Signed)
  Problem: Education: Goal: Knowledge of disease or condition will improve Outcome: Progressing   Problem: Activity: Goal: Ability to tolerate increased activity will improve Outcome: Progressing   Problem: Cardiac: Goal: Ability to achieve and maintain adequate cardiopulmonary perfusion will improve Outcome: Progressing

## 2023-03-28 NOTE — Progress Notes (Signed)
PROGRESS NOTE    Sarah Wolf  YQI:[id number] DOB: 04/08/1935 DOA: 03/13/2023 PCP: Tally Joe, MD     Brief Narrative:  Sarah Wolf is a 88 y.o. female with past medical history significant for HTN, paroxysmal atrial fibrillation, history of DVT no longer on anticoagulation who presents to [facility] ED on 8/26 with complaint of bilateral lower extremity pain, greatest on right.  Onset morning of admission, pain all the way down her right leg severe nature.  Review of records note patient with left lower extremity DVT 2020 as well as echo noting A-fib at that time.  She was on Xarelto temporarily after being found to have a LLE DVT following a fall with femur fracture 03/08/2019.  At baseline lives alone, ambulatory with a cane.  Patient denied chest pain, no palpitations, no shortness of breath, no cough, no nausea/vomiting/diarrhea, no lower extremity edema.  And route with EMS patient was noted to be in A-fib with RVR.   In the emergency department patient was noted to be afebrile with pulse elevated up to 145 and atrial fibrillation, systolic blood pressures noted to be elevated into the 160s., and O2 saturations currently maintained on 1.5 L nasal cannula oxygen.  Labs noted sodium 133, potassium 3.5, chloride 93, TSH 2.94 , BNP 799.9, and high-sensitivity troponins 55->57.  Doppler ultrasounds of the lower extremity did not note any signs of a DVT, but did note occlusion of the proximal to mid SFA.  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  As a surgery have been formally consulted.  Patient had been started on heparin drip and given metoprolol IV intermittently.  Cardiology and vascular surgery were consulted.  TRH  consulted for admission for further evaluation and management of A-fib with RVR and acute occlusion of right SFA and left popliteal artery.   Significant Hospital events: 8/26 admitted to [facility] 8/28 thrombolysis of RLE with SFA stent, worsening encephalopathy and possible aspiration event leading to worsening hypoxia; pccm consulted and required intubation; Tx to ICU.  8/30: Extubated 9/1: transferred back to hospitalist service; pending SNF placement 9/6: coffee ground emesis, GI consulted  9/7: EGD deferred, dx PNA and started on cefepime  9/8: EGD found large hiatal hernia, severe circumferential esophagitis with ulceration without bleeding.  Suspected long segment of Barrett's esophagus, gastritis, nonbleeding duodenal ulcers.  Has multiple sites of possible bleeding, most likely from esophageal ulceration, duodenal ulcers in setting of anticoagulation.  Continue medical therapy with PPI.  Resume anticoagulation with heparin drip  New events last 24 hours / Subjective: Patient doing well overall.  Leukocytosis has resolved.  Hemoglobin slightly lower today 8.2, recheck 10.5.  Discussed need for SNF.  Assessment & Plan:   Principal Problem:   Critical limb ischemia of right lower extremity (HCC) Active Problems:   Atrial fibrillation with RVR (HCC)   Thrombus of right atrial appendage   Chronic HFpEF   Myocardial injury due to Afib   History of DVT (deep vein thrombosis)   Lesion of mediastinum   Hyponatremia   Acute respiratory failure with hypoxia (HCC)   Goals of care, counseling/discussion   Aspiration into airway   Upper GI bleed   Anticoagulated   Acute esophagitis   Gastritis and gastroduodenitis   Duodenal ulcer  Critical limb ischemia right lower extremity likely secondary to cardioembolic event Pain of pain to lower legs, acutely.  CT angiogram notable for acute occlusion right SFA and left popliteal artery.  Vascular surgery was consulted and patient underwent right  lower extremity angiogram/aortogram with peripheral thrombectomy with SFA stent.   -- Follow-up 4-6 weeks vascular surgery with RLE duplex/ABIs -- Aspirin and Eliquis are on hold due to GI bleed --> started IV heparin and will monitor GIB  Upper GI bleed -- FOBT+ -- Hold aspirin and Eliquis --> started IV heparin and will monitor GIB -- EGD found large hiatal hernia, severe circumferential esophagitis with ulceration without bleeding.  Suspected long segment of Barrett's esophagus, gastritis, nonbleeding duodenal ulcers.  Has multiple sites of possible bleeding, most likely from esophageal ulceration, duodenal ulcers in setting of anticoagulation.  Continue medical therapy with PPI.  Resume anticoagulation with heparin drip -- PPI IV, carafate  -- Hgb stable today  -- Advance diet  Sepsis, not POA, secondary to HCAP -- Worsening WBC with productive cough -- Blood cx negative to date -- UA negative  -- Cefepime started 9/7  -- Leukocytosis resolved -- Patient with coughing with drinking water today.  SLP eval   Acute respiratory failure with hypoxia Suspect aspiration event Following aortogram with thrombectomy, patient has suspected aspiration event and developed acute respiratory failure with hypoxia.  Patient required intubation and transferred to the intensive care unit.  Patient was successfully extubated on 03/17/2023.  Tracheal aspirate with normal respiratory flora, MRSA PCR negative.  Completed 7-day course of antibiotics with Unasyn followed by ceftriaxone. -- Incentive spirometry -- Continue supplemental oxygen, maintain SpO2 > 90%   Acute encephalopathy: Resolved Etiology likely secondary to respiratory failure with hypoxia/hypercarbia after aspiration event as above.  CT head negative.  EEG with no seizure or epileptiform discharges noted.  Ammonia level within normal limits. -- Supportive care   Paroxysmal atrial fibrillation with RVR Previously noted to be in A-fib in  2020, but not on anticoagulation.  Given new embolic event, now will likely need to be on lifelong anticoagulation.  Cardiology avoiding cardioversion in the setting of possible LAA appendage thrombus. -- Cardiology following, appreciate assistance -- Metoprolol, amiodarone -- Eliquis 5 mg p.o. twice daily --> started IV heparin and will monitor GIB  Chronic systolic congestive heart failure TTE with LVEF 40-45%, endocardial border not optimally defined to evaluate for regional wall motion abnormalities.  Seen by cardiology and now signed off. -- Metoprolol    Hx DVT -- Eliquis 5 mg p.o. twice daily --> started IV heparin and will monitor GIB   Type 2 diabetes mellitus Hemoglobin A1c 6.6.  Diet controlled.   Urinary retention Required Foley catheter, now resolved. -- Monitor urine output   Lesion of mediastinum Noted incidental finding on CT chest of nodule anterior mediastinum -- Outpatient follow-up with CT surgery   Weakness/debility/deconditioning: PT/OT recommending SNF placement. -- TOC consulted  CKD stage IIIa -- Stable  Large diaphragmatic hernia -- Seen on chest x-ray 9/3 -- Could consider outpatient general surgery consultation if surgical intervention is desired once her acute illness is stable  Poor perfusion of her upper extremities -- Her fingers bilaterally seem dusky on examination.  This has been ongoing since last week per niece.  Vascular duplex of upper extremities showing no significant arterial obstruction bilaterally.  Did have severely dampened waveforms in all 10 digits -- ?Raynaud's. Left hand appearance improved.     DVT prophylaxis: Eliquis on hold --> started IV  heparin and will monitor GIB  Code Status: DNR, no CPR if pulseless, DNI Family Communication: Nephew at bedside Disposition Plan: SNF, [name]  Status is: Inpatient Remains inpatient appropriate because: Not medically ready for discharge, remains on IV heparin.  If hemoglobin  remains stable another day, could consider transition to Eliquis 9/11.  Heart rate is better on amiodarone.  Likely discharge to SNF 1 to 2 days.    Antimicrobials:  Anti-infectives (From admission, onward)    Start     Dose/Rate Route Frequency Ordered Stop   03/25/23 0945  ceFEPIme (MAXIPIME) 2 g in sodium chloride 0.9 % 100 mL IVPB        2 g 200 mL/hr over 30 Minutes Intravenous Every 12 hours 03/25/23 0853     03/24/23 0500  Ampicillin-Sulbactam (UNASYN) 3 g in sodium chloride 0.9 % 100 mL IVPB  Status:  Discontinued        3 g 200 mL/hr over 30 Minutes Intravenous Every 8 hours 03/24/23 0420 03/25/23 0853   03/18/23 0830  cefTRIAXone (ROCEPHIN) 2 g in sodium chloride 0.9 % 100 mL IVPB        2 g 200 mL/hr over 30 Minutes Intravenous Every 24 hours 03/18/23 0731 03/21/23 0952   03/15/23 2330  Ampicillin-Sulbactam (UNASYN) 3 g in sodium chloride 0.9 % 100 mL IVPB  Status:  Discontinued        3 g 200 mL/hr over 30 Minutes Intravenous Every 6 hours 03/15/23 2239 03/18/23 0731        Objective: Vitals:   03/27/23 2158 03/27/23 2159 03/28/23 0330 03/28/23 0808  BP:   (!) 126/109 118/87  Pulse:   88 (!) 103  Resp: 15 14 15 15   Temp:    97.6 F (36.4 C)  TempSrc:   Oral Oral  SpO2:   99% 95%  Weight:   66 kg   Height:        Intake/Output Summary (Last 24 hours) at 03/28/2023 1152 Last data filed at 03/28/2023 0808 Gross per 24 hour  Intake 592.47 ml  Output --  Net 592.47 ml   Filed Weights   03/26/23 0624 03/27/23 0513 03/28/23 0330  Weight: 62.8 kg 63.7 kg 66 kg   Examination: General exam: Appears calm and comfortable  Respiratory system: Clear to auscultation anteriorly Cardiovascular system: S1 & S2 heard, irregular rhythm, rate 90-100s.   Gastrointestinal system: Abdomen is nondistended, soft and nontender. Normal bowel sounds heard. Central nervous system: Alert and oriented. Non focal exam. Speech clear  Extremities: Symmetric in appearance bilaterally   Psychiatry: Judgement and insight appear stable. Mood & affect appropriate.     Data Reviewed: I have personally reviewed following labs and imaging studies  CBC: Recent Labs  Lab 03/24/23 1035 03/24/23 1326 03/25/23 0717 03/25/23 1219 03/26/23 0333 03/27/23 0710 03/28/23 0600 03/28/23 0757  WBC 18.3*  --  24.6*  --  14.7* 12.5* 9.9  --   NEUTROABS  --   --   --   --  12.4*  --   --   --   HGB 13.4   < > 11.4* 10.9* 10.7* 11.3* 8.2* 10.5*  HCT 40.0   < > 35.0* 33.4* 32.8* 35.0* 25.5* 32.7*  MCV 96.2  --  95.9  --  99.1 98.6 98.8  --   PLT 238  --  300  --  275 285 230  --    < > = values in this interval not displayed.   Basic  Metabolic Panel: Recent Labs  Lab 03/23/23 1025 03/24/23 1035 03/25/23 0717 03/26/23 0333 03/27/23 0710  NA 133* 139 135 137 134*  K 4.2 4.1 4.2 3.0* 3.6  CL 101 96* 96* 99 99  CO2 22 25 27 29 27   GLUCOSE 109* 108* 103* 114* 110*  BUN 31* 26* 18 18 16   CREATININE 1.03* 0.94 0.75 0.71 0.74  CALCIUM 8.7* 9.0 8.2* 8.5* 8.5*  MG 2.1  --   --   --  2.0  PHOS 3.3  --   --   --   --    GFR: Estimated Creatinine Clearance: 45 mL/min (by C-G formula based on SCr of 0.74 mg/dL). Liver Function Tests: No results for input(s): "AST", "ALT", "ALKPHOS", "BILITOT", "PROT", "ALBUMIN" in the last 168 hours.  No results for input(s): "LIPASE", "AMYLASE" in the last 168 hours. No results for input(s): "AMMONIA" in the last 168 hours.  Coagulation Profile: No results for input(s): "INR", "PROTIME" in the last 168 hours. Cardiac Enzymes: No results for input(s): "CKTOTAL", "CKMB", "CKMBINDEX", "TROPONINI" in the last 168 hours. BNP (last 3 results) No results for input(s): "PROBNP" in the last 8760 hours. HbA1C: No results for input(s): "HGBA1C" in the last 72 hours. CBG: No results for input(s): "GLUCAP" in the last 168 hours.  Lipid Profile: No results for input(s): "CHOL", "HDL", "LDLCALC", "TRIG", "CHOLHDL", "LDLDIRECT" in the last 72  hours. Thyroid Function Tests: No results for input(s): "TSH", "T4TOTAL", "FREET4", "T3FREE", "THYROIDAB" in the last 72 hours. Anemia Panel: No results for input(s): "VITAMINB12", "FOLATE", "FERRITIN", "TIBC", "IRON", "RETICCTPCT" in the last 72 hours. Sepsis Labs: Recent Labs  Lab 03/24/23 0457 03/24/23 1106  LATICACIDVEN 2.3* 2.9*    Recent Results (from the past 240 hour(s))  Urine Culture     Status: Abnormal   Collection Time: 03/20/23  4:32 AM   Specimen: Urine, Clean Catch  Result Value Ref Range Status   Specimen Description URINE, CLEAN CATCH  Final   Special Requests NONE  Final   Culture (A)  Final    <10,000 COLONIES/mL INSIGNIFICANT GROWTH Performed at [facility], [address], [place], [place] [address]    Report Status 03/21/2023 FINAL  Final  Culture, blood (Routine X 2) w Reflex to ID Panel     Status: None (Preliminary result)   Collection Time: 03/24/23 11:06 AM   Specimen: BLOOD LEFT HAND  Result Value Ref Range Status   Specimen Description BLOOD LEFT HAND  Final   Special Requests   Final    BOTTLES DRAWN AEROBIC ONLY Blood Culture adequate volume   Culture   Final    NO GROWTH 4 DAYS Performed at [facility], [address] [place], [place] [address]    Report Status PENDING  Incomplete  Culture, blood (Routine X 2) w Reflex to ID Panel     Status: None (Preliminary result)   Collection Time: 03/24/23  1:26 PM   Specimen: BLOOD LEFT ARM  Result Value Ref Range Status   Specimen Description BLOOD LEFT ARM  Final   Special Requests   Final    BOTTLES DRAWN AEROBIC ONLY Blood Culture results may not be optimal due to an inadequate volume of blood received in culture bottles   Culture   Final    NO GROWTH 4 DAYS Performed at [facility], [address] [place], [place] [address]    Report Status PENDING  Incomplete      Radiology Studies: DG  CHEST PORT 1 VIEW  Result Date: 03/26/2023 CLINICAL DATA:  PICC line  placement. EXAM: PORTABLE CHEST 1 VIEW COMPARISON:  March 24, 2023 FINDINGS: Left PICC line terminates at the expected location of the superior vena cava. No evidence of pneumothorax. Enlarged cardiac silhouette. Elevation of the left hemidiaphragm. Small right pleural effusion. Consolidation versus atelectasis in the left lower lobe. IMPRESSION: 1. Left PICC line terminates at the expected location of the superior vena cava. 2. Small right pleural effusion. 3. Consolidation versus atelectasis in the left lower lobe. Electronically Signed   By: Ted Mcalpine M.D.   On: 03/26/2023 15:34      Scheduled Meds:  amiodarone  200 mg Oral BID   atorvastatin  40 mg Oral Daily   Chlorhexidine Gluconate Cloth  6 each Topical Daily   feeding supplement  237 mL Oral BID BM   metoprolol tartrate  100 mg Oral BID   pantoprazole (PROTONIX) IV  40 mg Intravenous Q12H   pneumococcal 20-valent conjugate vaccine  0.5 mL Intramuscular Tomorrow-1000   sodium chloride flush  10-40 mL Intracatheter Q12H   sodium chloride flush  3 mL Intravenous Q12H   sucralfate  1 g Oral Q6H   Continuous Infusions:  sodium chloride 10 mL/hr at 03/26/23 2334   sodium chloride Stopped (03/20/23 0031)   sodium chloride     ceFEPime (MAXIPIME) IV 2 g (03/28/23 0907)   heparin 600 Units/hr (03/28/23 0525)     LOS: 15 days   Time spent: 25 minutes   Noralee Stain, DO [facility] 03/28/2023, 11:52 AM   Available via Epic secure chat 7am-7pm After these hours, please refer to coverage provider listed on [email or web address]

## 2023-03-28 NOTE — Progress Notes (Signed)
ANTICOAGULATION CONSULT NOTE - Follow Up Consult  Pharmacy Consult for Heparin Indication:  PAD but with upper GI bleed  No Known Allergies  Patient Measurements: Height: 5\' 2"  (157.5 cm) Weight: 66 kg (145 lb 8.1 oz) IBW/kg (Calculated) : 50.1 Heparin Dosing Weight: 63.7 kg  Vital Signs: Temp: 97.4 F (36.3 C) (09/10 1634) Temp Source: Oral (09/10 1634) BP: 117/103 (09/10 1634) Pulse Rate: 99 (09/10 1634)  Labs: Recent Labs    03/26/23 0333 03/26/23 2113 03/27/23 0710 03/27/23 1941 03/28/23 0600 03/28/23 0757 03/28/23 1550  HGB 10.7*  --  11.3*  --  8.2* 10.5*  --   HCT 32.8*  --  35.0*  --  25.5* 32.7*  --   PLT 275  --  285  --  230  --   --   APTT  --    < > 120* 51*  --  100* 69*  HEPARINUNFRC  --    < > >1.10*  --   --  0.36 0.37  CREATININE 0.71  --  0.74  --   --   --   --    < > = values in this interval not displayed.    Estimated Creatinine Clearance: 45 mL/min (by C-G formula based on SCr of 0.74 mg/dL).  Assessment: Patient presented with acute on chronic critical limb ischemia in her right lower extremity. Initially she was initiated on Eliquis, however then developed coffee ground emesis and dark stools on 9/6, so both Eliquis and aspirin were held due to concern for upper GI bleed. She was set to have an EGD on 9/7, but this was postponed due to worsening renal function and she was started on IV PPI BID.    Successfully underwent EGD on 9/8 and no active bleeds were found, likely stopped with PPI use. Due to need for anticoagulation in setting of critical limb ischemia, pharmacy has been consulted to initiate heparin infusion.    Last Eliquis dose was 9/5 at 2147.   -Heparin level within goal range this AM, aPTT slightly above lower goal.  I believe the effects of recent DOAC on the heparin level have washed out and the heparin levels should now be accurate.  aPTT and heparin level came back therapeutic again this PM and levels are correlating. We  will stop using aPTT.   Goal of Therapy:  Heparin Level: 0.3-0.5 Monitor platelets by anticoagulation protocol: Yes   Plan:  Continue IV heparin at current rate. Daily heparin level and CBC.  Ulyses Southward, PharmD, BCIDP, AAHIVP, CPP Infectious Disease Pharmacist 03/28/2023 5:00 PM

## 2023-03-28 NOTE — Progress Notes (Signed)
ANTICOAGULATION CONSULT NOTE - Follow Up Consult  Pharmacy Consult for Heparin Indication:  PAD but with upper GI bleed  No Known Allergies  Patient Measurements: Height: 5\' 2"  (157.5 cm) Weight: 66 kg (145 lb 8.1 oz) IBW/kg (Calculated) : 50.1 Heparin Dosing Weight: 63.7 kg  Vital Signs: Temp: 97.6 F (36.4 C) (09/10 0808) Temp Source: Oral (09/10 0808) BP: 118/87 (09/10 0808) Pulse Rate: 103 (09/10 0808)  Labs: Recent Labs    03/26/23 0333 03/26/23 0333 03/26/23 2113 03/27/23 0710 03/27/23 1941 03/28/23 0600 03/28/23 0757  HGB 10.7*  --   --  11.3*  --  8.2* 10.5*  HCT 32.8*  --   --  35.0*  --  25.5* 32.7*  PLT 275  --   --  285  --  230  --   APTT  --    < > 159* 120* 51*  --  100*  HEPARINUNFRC  --   --  >1.10* >1.10*  --   --  0.36  CREATININE 0.71  --   --  0.74  --   --   --    < > = values in this interval not displayed.    Estimated Creatinine Clearance: 45 mL/min (by C-G formula based on SCr of 0.74 mg/dL).  Assessment: Patient presented with acute on chronic critical limb ischemia in her right lower extremity. Initially she was initiated on Eliquis, however then developed coffee ground emesis and dark stools on 9/6, so both Eliquis and aspirin were held due to concern for upper GI bleed. She was set to have an EGD on 9/7, but this was postponed due to worsening renal function and she was started on IV PPI BID.    Successfully underwent EGD on 9/8 and no active bleeds were found, likely stopped with PPI use. Due to need for anticoagulation in setting of critical limb ischemia, pharmacy has been consulted to initiate heparin infusion.    Last Eliquis dose was 9/5 at 2147.   -Heparin level within goal range this AM, aPTT slightly above lower goal.  I believe the effects of recent DOAC on the heparin level have washed out and the heparin levels should now be accurate.  Goal of Therapy:  Heparin level 0.3-0.5 units/ml aPTT 66-85 seconds Monitor platelets by  anticoagulation protocol: Yes   Plan:  Continue IV heparin at current rate. Confirm heparin level in 8 hrs. Daily heparin level and CBC.  Reece Leader, Colon Flattery, BCCP Clinical Pharmacist  03/28/2023 10:45 AM   [facility] pharmacy phone numbers are listed on [email or web address]

## 2023-03-28 NOTE — TOC Progression Note (Signed)
Transition of Care [facility]) - Progression Note    Patient Details  Name: Sarah Wolf MRN: [id number] Date of Birth: 1934/08/08  Transition of Care [facility]) CM/SW Contact  Eduard Roux, [place] Phone Number: 03/28/2023, 2:47 PM  Clinical Narrative:     SNF auth pending reference # [id number]  Antony Blackbird, MSW, LCSW Clinical Social Worker    Expected Discharge Plan: Skilled Nursing Facility Barriers to Discharge: Continued Medical Work up  Expected Discharge Plan and Services In-house Referral: Clinical Social Work Discharge Planning Services: CM Consult   Living arrangements for the past 2 months: Single Family Home                   DME Agency: NA                   Social Determinants of Health (SDOH) Interventions SDOH Screenings   Food Insecurity: No Food Insecurity (03/13/2023)  Housing: Medium Risk (03/13/2023)  Transportation Needs: No Transportation Needs (03/13/2023)  Utilities: Not At Risk (03/13/2023)  Tobacco Use: Low Risk  (03/26/2023)    Readmission Risk Interventions     No data to display

## 2023-03-28 NOTE — Progress Notes (Signed)
Mobility Specialist Progress Note:   03/28/23 1247  Mobility  Activity Ambulated with assistance in hallway  Level of Assistance Contact guard assist, steadying assist  Assistive Device Front wheel walker  Distance Ambulated (ft) 160 ft  Activity Response Tolerated well  Mobility Referral Yes  $Mobility charge 1 Mobility  Mobility Specialist Start Time (ACUTE ONLY) 1215  Mobility Specialist Stop Time (ACUTE ONLY) 1230  Mobility Specialist Time Calculation (min) (ACUTE ONLY) 15 min   Post Mobility: 89 HR   Pt received in bed, agreeable to mobility. Pt denied any discomfort during ambulation, asymptomatic throughout. Pt left in chair with call bell in hand and all needs met.   Leory Plowman  Mobility Specialist Please contact via [employer] office at [phone]

## 2023-03-28 NOTE — Progress Notes (Signed)
Pharmacy Antibiotic Note  Sarah Wolf is a 88 y.o. female admitted on 03/13/2023.  She had worsening respiratory status overnight and pharmacy has been consulted for change from unasyn to cefepime dosing for aspiration / HCAP pneumonia. WBC improved 24.6 > 9.9.  Cultures negative to date.  Today is day #4 of antibiotics.  Plan: Continue cefepime 2gm IV q12h  Height: 5\' 2"  (157.5 cm) Weight: 66 kg (145 lb 8.1 oz) IBW/kg (Calculated) : 50.1  Temp (24hrs), Avg:97.5 F (36.4 C), Min:97.3 F (36.3 C), Max:97.6 F (36.4 C)  Recent Labs  Lab 03/23/23 1025 03/24/23 0457 03/24/23 1035 03/24/23 1106 03/25/23 0717 03/26/23 0333 03/27/23 0710 03/28/23 0600  WBC 14.4*  --  18.3*  --  24.6* 14.7* 12.5* 9.9  CREATININE 1.03*  --  0.94  --  0.75 0.71 0.74  --   LATICACIDVEN  --  2.3*  --  2.9*  --   --   --   --     Estimated Creatinine Clearance: 45 mL/min (by C-G formula based on SCr of 0.74 mg/dL).    No Known Allergies  Cefepime 9/7 >> Unasyn 8/29 > 8/31, 9/6 > 9/7 CTX 8/31 > 9/4 - PNA vaccine ordered 8/29, not given  8/27 COVID: neg 8/29 Trach aspirate: normal resp flora 8/29 MRSA PCR: neg  9/2 urine: <10K/ml, insignificant growth 9/6 blood: ng x 4 days  Jenetta Downer, [facility] Clinical Pharmacist  03/28/2023 10:36 AM   [facility] pharmacy phone numbers are listed on [email or web address]

## 2023-03-28 NOTE — Progress Notes (Addendum)
Mobility Specialist Progress Note:   03/28/23 1158  Mobility  Activity Transferred to/from [facility]  Level of Assistance Standby assist, set-up cues, supervision of patient - no hands on  Assistive Device Front wheel walker  Distance Ambulated (ft) 3 ft  Activity Response Tolerated well  Mobility Referral Yes  $Mobility charge 1 Mobility  Mobility Specialist Start Time (ACUTE ONLY) 1145  Mobility Specialist Stop Time (ACUTE ONLY) 1156  Mobility Specialist Time Calculation (min) (ACUTE ONLY) 11 min   Pt received in bed, requesting to use BR. Upon standing pt found to already soiled bed. SB assist to stand and pivot. Pt left on [facility] with RN staff in room for pericare.   Leory Plowman  Mobility Specialist Please contact via [employer] office at [phone]

## 2023-03-28 NOTE — Progress Notes (Signed)
OT Cancellation Note  Patient Details Name: Sarah Wolf MRN: [id number] DOB: [birth year]   Cancelled Treatment:    Reason Eval/Treat Not Completed: Other (comment).  Patient just returned to bed, continue efforts as appropriate.    Neka Bise D Sherrell Weir 03/28/2023, 3:04 PM 03/28/2023  RP, OTR/L  Acute Rehabilitation Services  Office:  [phone]

## 2023-03-29 ENCOUNTER — Telehealth: Payer: Self-pay | Admitting: Gastroenterology

## 2023-03-29 DIAGNOSIS — I70221 Atherosclerosis of native arteries of extremities with rest pain, right leg: Secondary | ICD-10-CM | POA: Diagnosis not present

## 2023-03-29 LAB — CULTURE, BLOOD (ROUTINE X 2)
Culture: NO GROWTH
Culture: NO GROWTH
Special Requests: ADEQUATE

## 2023-03-29 LAB — CBC
HCT: 36 % (ref 36.0–46.0)
Hemoglobin: 11.7 g/dL — ABNORMAL LOW (ref 12.0–15.0)
MCH: 31 pg (ref 26.0–34.0)
MCHC: 32.5 g/dL (ref 30.0–36.0)
MCV: 95.2 fL (ref 80.0–100.0)
Platelets: 298 10*3/uL (ref 150–400)
RBC: 3.78 MIL/uL — ABNORMAL LOW (ref 3.87–5.11)
RDW: 15.1 % (ref 11.5–15.5)
WBC: 10.6 10*3/uL — ABNORMAL HIGH (ref 4.0–10.5)
nRBC: 0 % (ref 0.0–0.2)

## 2023-03-29 LAB — GLUCOSE, CAPILLARY: Glucose-Capillary: 163 mg/dL — ABNORMAL HIGH (ref 70–99)

## 2023-03-29 LAB — PROCALCITONIN: Procalcitonin: <0.1 ng/mL

## 2023-03-29 LAB — HEPARIN LEVEL (UNFRACTIONATED): Heparin Unfractionated: 0.38 [IU]/mL (ref 0.30–0.70)

## 2023-03-29 LAB — BRAIN NATRIURETIC PEPTIDE: B Natriuretic Peptide: 1262.6 pg/mL — ABNORMAL HIGH (ref 0.0–100.0)

## 2023-03-29 MED ORDER — HYDRALAZINE HCL 20 MG/ML IJ SOLN: 10.0000 mg | INTRAMUSCULAR | Status: DC | PRN

## 2023-03-29 MED ORDER — FUROSEMIDE 10 MG/ML IJ SOLN
40.0000 mg | Freq: Once | INTRAMUSCULAR | Status: AC
  Administered 2023-03-29: 40 mg via INTRAVENOUS
  Filled 2023-03-29: qty 4

## 2023-03-29 MED ORDER — TRAZODONE HCL 50 MG PO TABS
50.0000 mg | ORAL_TABLET | Freq: Every evening | ORAL | Status: DC | PRN
  Administered 2023-03-30: 50 mg via ORAL
  Filled 2023-03-29 (×2): qty 1

## 2023-03-29 MED ORDER — METOPROLOL TARTRATE 5 MG/5ML IV SOLN: 5.0000 mg | INTRAVENOUS | Status: DC | PRN

## 2023-03-29 MED ORDER — GLUCAGON HCL RDNA (DIAGNOSTIC) 1 MG IJ SOLR: 1.0000 mg | INTRAMUSCULAR | Status: DC | PRN

## 2023-03-29 MED ORDER — SENNOSIDES-DOCUSATE SODIUM 8.6-50 MG PO TABS: 1.0000 | ORAL_TABLET | Freq: Every evening | ORAL | Status: DC | PRN

## 2023-03-29 MED ORDER — APIXABAN 5 MG PO TABS
5.0000 mg | ORAL_TABLET | Freq: Two times a day (BID) | ORAL | Status: DC
  Administered 2023-03-29 – 2023-03-30 (×3): 5 mg via ORAL
  Filled 2023-03-29 (×3): qty 1

## 2023-03-29 NOTE — Progress Notes (Signed)
Occupational Therapy Treatment Patient Details Name: Sarah Wolf MRN: [id number] DOB: 07/19/1934 Today's Date: 03/29/2023   History of present illness Pt is 88 year old who presents to [facility] on 8/26 with complaint of bilateral lower extremity pain, greatest on right. Pt with critical limb ischemia RLE.  8/28 patient underwent thrombolysis of RLE. Worsening encephalopathy post procedure.Aspiration event leading to hypoxia and worsening AMS.  Intubated 8/28- 8/30 PMH - HTN, paroxysmal atrial fibrillation, history of DVT no longer on anticoagulation   OT comments  OT educated pt in techniques for increased safety and independence with ADLs, bed mobility in preparation for ADLs, and functional transfers. Pt currently demonstrates ability to complete UB ADLs largely with Set up, LB ADLs with Set up to Contact guard assist, bed mobility with Supervision, and sit/stand and stand-pivot transfers with close Supervision to Contact guard assist. Pt participated well in session and is making good progress toward OT goals. Pt will benefit from continued acute skilled OT services to address deficits outlined below, decrease caregiver burden, and increase safety and independence with functional tasks. Post acute discharge, pt will benefit from intensive inpatient skilled rehab services < 3 hours per day to maximize rehab potential.       If plan is discharge home, recommend the following:  A little help with walking and/or transfers;A little help with bathing/dressing/bathroom;Assistance with cooking/housework;Assist for transportation;Help with stairs or ramp for entrance   Equipment Recommendations  Other (comment) (Defer to next level of care)    Recommendations for Other Services      Precautions / Restrictions Precautions Precautions: Fall Precaution Comments: Active humeral fx to R arm Restrictions Weight Bearing Restrictions: No       Mobility Bed Mobility Overal bed mobility: Needs  Assistance Bed Mobility: Supine to Sit, Sit to Supine     Supine to sit: Supervision, HOB elevated, Used rails Sit to supine: Supervision        Transfers Overall transfer level: Needs assistance Equipment used: None Transfers: Sit to/from Stand, Bed to chair/wheelchair/BSC Sit to Stand: Supervision, Contact guard assist (Close Supervision to Contact guard assist) Stand pivot transfers: Supervision, Contact guard assist (Close Supervision to Contact guard assist)         General transfer comment: Contact guard assist to power up then close Supervision for stand-pivot transfer once in standing     Balance Overall balance assessment: Needs assistance Sitting-balance support: No upper extremity supported, Feet supported Sitting balance-Leahy Scale: Fair Sitting balance - Comments: sitting EOB   Standing balance support: Single extremity supported, During functional activity, Reliant on assistive device for balance Standing balance-Leahy Scale: Poor Standing balance comment: with unilateral UE support on armrest of bedside commode                           ADL either performed or assessed with clinical judgement   ADL Overall ADL's : Needs assistance/impaired Eating/Feeding: Set up;Sitting   Grooming: Set up;Sitting           Upper Body Dressing : Set up;Sitting   Lower Body Dressing: Contact guard assist;Sitting/lateral leans   Toilet Transfer: Supervision/safety;BSC/3in1;Contact guard assist;Stand-pivot (Close Supervision to Contact guard assist for safety)   Toileting- Clothing Manipulation and Hygiene: Set up;Sitting/lateral lean              Extremity/Trunk Assessment Upper Extremity Assessment Upper Extremity Assessment: Right hand dominant;RUE deficits/detail;Generalized weakness RUE Deficits / Details: Active humeral fx   Lower Extremity Assessment Lower Extremity  Assessment: Defer to PT evaluation        Vision       Perception      Praxis      Cognition Arousal: Alert Behavior During Therapy: A Rosie Place for tasks assessed/performed Overall Cognitive Status: Within Functional Limits for tasks assessed                                 General Comments: AAOx4 and pleasant throughout session. Demonstrates ability to consistently follow 1-step commands.        Exercises      Shoulder Instructions       General Comments VSS on RA throughout session. Pt with mild dyspnea following BSC to bed transfer with pt quickly recovering with brief sitting rest break. Pt's nephew present at end of session.     Pertinent Vitals/ Pain       Pain Assessment Pain Assessment: No/denies pain Pain Intervention(s): Monitored during session  Home Living                                          Prior Functioning/Environment              Frequency  Min 1X/week        Progress Toward Goals  OT Goals(current goals can now be found in the care plan section)  Progress towards OT goals: Progressing toward goals  Acute Rehab OT Goals Patient Stated Goal: To feel better  Plan      Co-evaluation                 AM-PAC OT "6 Clicks" Daily Activity     Outcome Measure   Help from another person eating meals?: A Little Help from another person taking care of personal grooming?: A Little Help from another person toileting, which includes using toliet, bedpan, or urinal?: A Little Help from another person bathing (including washing, rinsing, drying)?: A Little Help from another person to put on and taking off regular upper body clothing?: A Little Help from another person to put on and taking off regular lower body clothing?: A Little 6 Click Score: 18    End of Session Equipment Utilized During Treatment: Other (comment) (bedside commode)  OT Visit Diagnosis: Muscle weakness (generalized) (M62.81);Unsteadiness on feet (R26.81);Other (comment) (Decreased activity tolerance)    Activity Tolerance Patient tolerated treatment well   Patient Left in bed;with call bell/phone within reach;with family/visitor present   Nurse Communication Mobility status        Time: 1610-9604 OT Time Calculation (min): 17 min  Charges: OT General Charges $OT Visit: 1 Visit OT Treatments $Self Care/Home Management : 8-22 mins  Dalvin Clipper "Orson Eva., OTR/L, MA Acute Rehab [phone]   Lendon Colonel 03/29/2023, 3:21 PM

## 2023-03-29 NOTE — Progress Notes (Signed)
Physical Therapy Treatment Patient Details Name: Sarah Wolf MRN: [id number] DOB: July 20, 1934 Today's Date: 03/29/2023   History of Present Illness Pt is 88 year old who presents to [facility] on 8/26 with complaint of bilateral lower extremity pain, greatest on right. Pt with critical limb ischemia RLE.  8/28 patient underwent thrombolysis of RLE. Worsening encephalopathy post procedure.Aspiration event leading to hypoxia and worsening AMS.  Intubated 8/28- 8/30 PMH - HTN, paroxysmal atrial fibrillation, history of DVT no longer on anticoagulation    PT Comments  Pt received in supine and agreeable to session. Pt able to perform mobility tasks with up to min A. Pt continues to be limited by weakness and impaired activity tolerance. Pt initially demonstrating posterior bias during standing, but is able to improve with cues and repetition. Pt continues to benefit from PT services to progress toward functional mobility goals.    If plan is discharge home, recommend the following: A little help with walking and/or transfers;A little help with bathing/dressing/bathroom;Help with stairs or ramp for entrance;Assist for transportation;Direct supervision/assist for medications management;Assistance with cooking/housework   Can travel by private vehicle     Yes  Equipment Recommendations  Rolling walker (2 wheels)    Recommendations for Other Services       Precautions / Restrictions Precautions Precautions: Fall Precaution Comments: Active humeral fx to R arm Restrictions Weight Bearing Restrictions: No     Mobility  Bed Mobility Overal bed mobility: Needs Assistance Bed Mobility: Sit to Supine, Supine to Sit     Supine to sit: Used rails, Supervision, HOB elevated Sit to supine: Supervision   General bed mobility comments: Increased difficulty with supine scoot towards HOB requiring min A    Transfers Overall transfer level: Needs assistance Equipment used: Rolling walker (2  wheels) Transfers: Sit to/from Stand Sit to Stand: Contact guard assist, Min assist           General transfer comment: STS from EOB x3 with initial min A due to posterior bias, progressing to CGA with cues for anterior weight shift    Ambulation/Gait Ambulation/Gait assistance: Contact guard assist Gait Distance (Feet): 150 Feet Assistive device: Rolling walker (2 wheels) Gait Pattern/deviations: Step-through pattern, Trunk flexed, Decreased stride length       General Gait Details: Slow step-through pattern with cues for upright posture, but pt only briefly correcting      Balance Overall balance assessment: Needs assistance Sitting-balance support: No upper extremity supported, Feet supported Sitting balance-Leahy Scale: Fair Sitting balance - Comments: sitting EOB   Standing balance support: Bilateral upper extremity supported, Reliant on assistive device for balance, During functional activity Standing balance-Leahy Scale: Poor Standing balance comment: with RW support                            Cognition Arousal: Alert Behavior During Therapy: WFL for tasks assessed/performed Overall Cognitive Status: Within Functional Limits for tasks assessed                                          Exercises      General Comments        Pertinent Vitals/Pain Pain Assessment Pain Assessment: No/denies pain     PT Goals (current goals can now be found in the care plan section) Acute Rehab PT Goals Patient Stated Goal: not stated PT Goal Formulation: With patient  Time For Goal Achievement: 03/31/23 Progress towards PT goals: Progressing toward goals    Frequency    Min 1X/week       AM-PAC PT "6 Clicks" Mobility   Outcome Measure  Help needed turning from your back to your side while in a flat bed without using bedrails?: A Little Help needed moving from lying on your back to sitting on the side of a flat bed without using  bedrails?: A Little Help needed moving to and from a bed to a chair (including a wheelchair)?: A Little Help needed standing up from a chair using your arms (e.g., wheelchair or bedside chair)?: A Little Help needed to walk in hospital room?: A Little Help needed climbing 3-5 steps with a railing? : A Lot 6 Click Score: 17    End of Session   Activity Tolerance: Patient tolerated treatment well Patient left: in bed;with call bell/phone within reach Nurse Communication: Mobility status PT Visit Diagnosis: Other abnormalities of gait and mobility (R26.89);Muscle weakness (generalized) (M62.81)     Time: 4332-9518 PT Time Calculation (min) (ACUTE ONLY): 18 min  Charges:    $Gait Training: 8-22 mins PT General Charges $$ ACUTE PT VISIT: 1 Visit                    Johny Shock, PTA Acute Rehabilitation Services Secure Chat Preferred  [phone]    Johny Shock 03/29/2023, 12:42 PM

## 2023-03-29 NOTE — Progress Notes (Addendum)
ANTICOAGULATION CONSULT NOTE - Follow Up Consult  Pharmacy Consult for Heparin Indication:  PAD but with upper GI bleed  No Known Allergies  Patient Measurements: Height: 5\' 2"  (157.5 cm) Weight: 64.5 kg (142 lb 3.2 oz) IBW/kg (Calculated) : 50.1 Heparin Dosing Weight: 63.7 kg  Vital Signs: Temp: 97.6 F (36.4 C) (09/11 0824) Temp Source: Oral (09/11 0824) BP: 145/104 (09/11 0854) Pulse Rate: 93 (09/11 0854)  Labs: Recent Labs    03/27/23 0710 03/27/23 1941 03/28/23 0600 03/28/23 0757 03/28/23 1550 03/29/23 0750  HGB 11.3*  --  8.2* 10.5*  --  11.7*  HCT 35.0*  --  25.5* 32.7*  --  36.0  PLT 285  --  230  --   --  298  APTT 120* 51*  --  100* 69*  --   HEPARINUNFRC >1.10*  --   --  0.36 0.37 0.38  CREATININE 0.74  --   --   --   --   --     Estimated Creatinine Clearance: 44.5 mL/min (by C-G formula based on SCr of 0.74 mg/dL).  Assessment: Patient presented with acute on chronic critical limb ischemia in her right lower extremity. Initially she was initiated on Eliquis, however then developed coffee ground emesis and dark stools on 9/6, so both Eliquis and aspirin were held due to concern for upper GI bleed.    Successfully underwent EGD on 9/8 and no active bleed found. Due to need for anticoagulation in setting of critical limb ischemia and also noted with afib. Pharmacy has been consulted to initiate heparin infusion.   -Heparin level within goal range this AM -hg= 11.7  Goal of Therapy:  Heparin Level: 0.3-0.5 Monitor platelets by anticoagulation protocol: Yes   Plan:  Continue IV heparin at 600 units/hr Daily heparin level and CBC Will follow plans to restart oral anticoagulation  Harland German, PharmD Clinical Pharmacist **Pharmacist phone directory can now be found on [email or web address] ([email or web address]).  Listed under [facility].  Addendum -plans to restart apixaban (SCr 0.74, wt 64kg)  Plan -Stop heparin at 6pm and start apixaban 5mg  po bid  Harland German,  PharmD Clinical Pharmacist **Pharmacist phone directory can now be found on [email or web address] ([email or web address]).  Listed under [facility].

## 2023-03-29 NOTE — Progress Notes (Signed)
PROGRESS NOTE    Sarah Wolf  ZOX:[id number] DOB: 23-Feb-1935 DOA: 03/13/2023 PCP: Tally Joe, MD       Brief Narrative:  Sarah Wolf is a 88 y.o. female with past medical history significant for HTN, paroxysmal atrial fibrillation, history of DVT no longer on anticoagulation who presents to [facility] ED on 8/26 with complaint of bilateral lower extremity pain, greatest on right.  Onset morning of admission, pain all the way down her right leg severe nature.  Review of records note patient with left lower extremity DVT 2020 as well as echo noting A-fib at that time.  She was on Xarelto temporarily after being found to have a LLE DVT following a fall with femur fracture 03/08/2019.    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   Significant Hospital events: 8/26 admitted to [facility] 8/28 thrombolysis of RLE with SFA stent, worsening encephalopathy and possible aspiration event leading to worsening hypoxia; pccm consulted and required intubation; Tx to ICU.  8/30: Extubated 9/1: transferred back to hospitalist service; pending SNF placement 9/6: coffee ground emesis, GI consulted  9/7: EGD deferred, dx PNA and started on cefepime  9/8: EGD found large hiatal hernia, severe circumferential esophagitis with ulceration without bleeding.  Suspected long segment of Barrett's esophagus, gastritis, nonbleeding duodenal ulcers.  Has multiple sites of possible bleeding, most likely from esophageal ulceration, duodenal ulcers in setting of anticoagulation.  Continue medical therapy with PPI.  Resume anticoagulation with heparin drip     Assessment & Plan:   Principal Problem:   Critical limb ischemia of right lower  extremity (HCC) Active Problems:   Atrial fibrillation with RVR (HCC)   Thrombus of right atrial appendage   Chronic HFpEF   Myocardial injury due to Afib   History of DVT (deep vein thrombosis)   Lesion of mediastinum   Hyponatremia   Acute respiratory failure with hypoxia (HCC)   Goals of care, counseling/discussion   Aspiration into airway   Upper GI bleed   Anticoagulated   Acute esophagitis   Gastritis and gastroduodenitis   Duodenal ulcer     Critical limb ischemia right lower extremity likely secondary to cardioembolic event CT angiogram notable for acute occlusion right SFA and left popliteal artery.  Vascular surgery performed angiogram/aortogram with peripheral thrombectomy with SFA stent.   -Follow-up 4-6 weeks vascular surgery with RLE duplex/ABIs -Aspirin and Eliquis are on hold due to GI bleed --> started IV heparin and will monitor GIB   Upper GI bleed secondary to esophageal and duodenal ulcers Status post endoscopy 9/7 by GI noting esophageal and duodenal ulcers in the setting of anticoagulation.  Now on heparin drip, PPI, Carafate.   Sepsis, not POA, secondary to HCAP Concerns of pneumonia.  Currently on cefepime.  Will check procalcitonin.   Acute respiratory failure with hypoxia Suspect aspiration event Previously completed Unasyn followed by Rocephin.  Now there is concerns of repeat pneumonia?  And started on cefepime.  Bronchodilators, I-S/flutter valve.  Supplemental oxygen.  Procalcitonin pending.  Elevated BNP, IV Lasix ordered   Acute encephalopathy: Resolved EEG, CT head and ammonia levels are negative   Paroxysmal atrial fibrillation with RVR Currently patient is on metoprolol, amiodarone and heparin drip with plans to start Eliquis  Acute on chronic systolic congestive heart failure EF is 45%.  On metoprolol.  One-time IV Lasix   Hx DVT Currently on heparin   Type 2 diabetes mellitus Hemoglobin A1c 6.6.  Diet controlled.   Urinary  retention Resolved   Lesion of mediastinum Noted incidental finding on CT chest of nodule anterior mediastinum Outpatient CT surgery follow-up   Weakness/debility/deconditioning: PT/OT recommending SNF placement.     Large diaphragmatic hernia Stable follow-up outpatient   Poor perfusion of her upper extremities Vascular Dopplers did not show any obvious obstruction   DVT prophylaxis: Eliquis on hold --> started IV heparin and will monitor GIB   Code Status: DNR, no CPR if pulseless, DNI Family Communication: Nephew at bedside Disposition Plan: SNF, [name]  Status is: Inpatient Remains inpatient appropriate because: Not medically ready for discharge, remains on IV heparin.  If hemoglobin remains stable another day, could consider transition to Eliquis 9/11.  Heart rate is better on amiodarone.  Likely discharge to SNF 1 to 2 days.             Subjective: Denies any complaints.   Examination:  General exam: Appears calm and comfortable  Respiratory system: Bilateral rhonchi Cardiovascular system: S1 & S2 heard, RRR. No JVD, murmurs, rubs, gallops or clicks. No pedal edema. Gastrointestinal system: Abdomen is nondistended, soft and nontender. No organomegaly or masses felt. Normal bowel sounds heard. Central nervous system: Alert and oriented. No focal neurological deficits. Extremities: Symmetric 5 x 5 power. Skin: No rashes, lesions or ulcers Psychiatry: Judgement and insight appear normal. Mood & affect appropriate.       Diet Orders (From admission, onward)     Start     Ordered   03/28/23 1154  DIET DYS 3 Room service appropriate? Yes; Fluid consistency: Thin  Diet effective now       Question Answer Comment  Room service appropriate? Yes   Fluid consistency: Thin      03/28/23 1153            Objective: Vitals:   03/28/23 2315 03/29/23 0340 03/29/23 0824 03/29/23 0854  BP: (!) 143/98 (!) 154/98 (!) 151/124 (!) 145/104  Pulse: 71 83 77  93  Resp: 17 13 18 18   Temp: 97.6 F (36.4 C) (!) 97.5 F (36.4 C) 97.6 F (36.4 C)   TempSrc: Oral Oral Oral   SpO2: 100% 100% 100%   Weight:  64.5 kg    Height:        Intake/Output Summary (Last 24 hours) at 03/29/2023 1248 Last data filed at 03/29/2023 0500 Gross per 24 hour  Intake 358 ml  Output --  Net 358 ml   Filed Weights   03/27/23 0513 03/28/23 0330 03/29/23 0340  Weight: 63.7 kg 66 kg 64.5 kg    Scheduled Meds:  amiodarone  200 mg Oral BID   atorvastatin  40 mg Oral Daily   Chlorhexidine Gluconate Cloth  6 each Topical Daily   feeding supplement  237 mL Oral BID BM   furosemide  40 mg Intravenous Once   metoprolol tartrate  100 mg Oral BID   pantoprazole (PROTONIX) IV  40 mg Intravenous Q12H   pneumococcal 20-valent conjugate vaccine  0.5 mL Intramuscular Tomorrow-1000   sodium chloride flush  10-40 mL Intracatheter Q12H   sodium chloride flush  3 mL Intravenous Q12H   sucralfate  1 g Oral Q6H   Continuous Infusions:  sodium chloride 10 mL/hr at 03/26/23 2334   sodium chloride Stopped (  03/20/23 0031)   sodium chloride     ceFEPime (MAXIPIME) IV 2 g (03/29/23 0902)   heparin 600 Units/hr (03/28/23 0525)    Nutritional status     Body mass index is 26.01 kg/m.  Data Reviewed:   CBC: Recent Labs  Lab 03/25/23 0717 03/25/23 1219 03/26/23 0333 03/27/23 0710 03/28/23 0600 03/28/23 0757 03/29/23 0750  WBC 24.6*  --  14.7* 12.5* 9.9  --  10.6*  NEUTROABS  --   --  12.4*  --   --   --   --   HGB 11.4*   < > 10.7* 11.3* 8.2* 10.5* 11.7*  HCT 35.0*   < > 32.8* 35.0* 25.5* 32.7* 36.0  MCV 95.9  --  99.1 98.6 98.8  --  95.2  PLT 300  --  275 285 230  --  298   < > = values in this interval not displayed.   Basic Metabolic Panel: Recent Labs  Lab 03/23/23 1025 03/24/23 1035 03/25/23 0717 03/26/23 0333 03/27/23 0710  NA 133* 139 135 137 134*  K 4.2 4.1 4.2 3.0* 3.6  CL 101 96* 96* 99 99  CO2 22 25 27 29 27   GLUCOSE 109* 108* 103* 114*  110*  BUN 31* 26* 18 18 16   CREATININE 1.03* 0.94 0.75 0.71 0.74  CALCIUM 8.7* 9.0 8.2* 8.5* 8.5*  MG 2.1  --   --   --  2.0  PHOS 3.3  --   --   --   --    GFR: Estimated Creatinine Clearance: 44.5 mL/min (by C-G formula based on SCr of 0.74 mg/dL). Liver Function Tests: No results for input(s): "AST", "ALT", "ALKPHOS", "BILITOT", "PROT", "ALBUMIN" in the last 168 hours. No results for input(s): "LIPASE", "AMYLASE" in the last 168 hours. No results for input(s): "AMMONIA" in the last 168 hours. Coagulation Profile: No results for input(s): "INR", "PROTIME" in the last 168 hours. Cardiac Enzymes: No results for input(s): "CKTOTAL", "CKMB", "CKMBINDEX", "TROPONINI" in the last 168 hours. BNP (last 3 results) No results for input(s): "PROBNP" in the last 8760 hours. HbA1C: No results for input(s): "HGBA1C" in the last 72 hours. CBG: No results for input(s): "GLUCAP" in the last 168 hours. Lipid Profile: No results for input(s): "CHOL", "HDL", "LDLCALC", "TRIG", "CHOLHDL", "LDLDIRECT" in the last 72 hours. Thyroid Function Tests: No results for input(s): "TSH", "T4TOTAL", "FREET4", "T3FREE", "THYROIDAB" in the last 72 hours. Anemia Panel: No results for input(s): "VITAMINB12", "FOLATE", "FERRITIN", "TIBC", "IRON", "RETICCTPCT" in the last 72 hours. Sepsis Labs: Recent Labs  Lab 03/24/23 0457 03/24/23 1106  LATICACIDVEN 2.3* 2.9*    Recent Results (from the past 240 hour(s))  Urine Culture     Status: Abnormal   Collection Time: 03/20/23  4:32 AM   Specimen: Urine, Clean Catch  Result Value Ref Range Status   Specimen Description URINE, CLEAN CATCH  Final   Special Requests NONE  Final   Culture (A)  Final    <10,000 COLONIES/mL INSIGNIFICANT GROWTH Performed at [facility], [address], [place], [place] [address]    Report Status 03/21/2023 FINAL  Final  Culture, blood (Routine X 2) w Reflex to ID Panel     Status: None   Collection Time: 03/24/23 11:06 AM    Specimen: BLOOD LEFT HAND  Result Value Ref Range Status   Specimen Description BLOOD LEFT HAND  Final   Special Requests   Final    BOTTLES DRAWN AEROBIC ONLY Blood Culture adequate  volume   Culture   Final    NO GROWTH 5 DAYS Performed at [facility], [address], [place], [place] [address]    Report Status 03/29/2023 FINAL  Final  Culture, blood (Routine X 2) w Reflex to ID Panel     Status: None   Collection Time: 03/24/23  1:26 PM   Specimen: BLOOD LEFT ARM  Result Value Ref Range Status   Specimen Description BLOOD LEFT ARM  Final   Special Requests   Final    BOTTLES DRAWN AEROBIC ONLY Blood Culture results may not be optimal due to an inadequate volume of blood received in culture bottles   Culture   Final    NO GROWTH 5 DAYS Performed at [facility], [address], [place], [place] [address]    Report Status 03/29/2023 FINAL  Final         Radiology Studies: No results found.         LOS: 16 days   Time spent= 35 mins    Miguel Rota, MD [facility]  If 7PM-7AM, please contact night-coverage  03/29/2023, 12:48 PM

## 2023-03-29 NOTE — Telephone Encounter (Signed)
Returned call to Meagan, see 03/26/23 pathology result note for details.

## 2023-03-29 NOTE — Plan of Care (Signed)
  Problem: Activity: Goal: Ability to tolerate increased activity will improve Outcome: Progressing   Problem: Cardiac: Goal: Ability to achieve and maintain adequate cardiopulmonary perfusion will improve Outcome: Progressing   

## 2023-03-29 NOTE — Progress Notes (Signed)
Mobility Specialist Progress Note:   03/29/23 1116  Mobility  Activity Ambulated with assistance in hallway  Level of Assistance Minimal assist, patient does 75% or more  Assistive Device Front wheel walker  Distance Ambulated (ft) 128 ft  Activity Response Tolerated well  Mobility Referral Yes  $Mobility charge 1 Mobility  Mobility Specialist Start Time (ACUTE ONLY) 1045  Mobility Specialist Stop Time (ACUTE ONLY) 1105  Mobility Specialist Time Calculation (min) (ACUTE ONLY) 20 min   Pre Mobility: 90 HR During Mobility: 105 HR  , 97% SpO2 Post Mobility: 97 HR   Pt received in bed, agreeable to mobility. MinA to stand. CG during ambulation. Pt c/o legs feeling tired during ambulation, otherwise asymptomatic throughout. Pt returned to bed with call bell in reach and all needs met.   Leory Plowman  Mobility Specialist Please contact via [employer] office at [phone]

## 2023-03-29 NOTE — TOC Progression Note (Signed)
Transition of Care [facility]) - Progression Note    Patient Details  Name: Sarah Wolf MRN: [id number] Date of Birth: 1934-11-08  Transition of [facility] CM/SW Contact  Eduard Roux, [place] Phone Number: 03/29/2023, 2:14 PM  Clinical Narrative:     Patient has insurance auth 09/10-09/12 w/ [name]  SNF & MD updated  Family informed admission paperwork needs to be completed for SNF.  TOC will continue to follow and assist with discharge planning.   Antony Blackbird, MSW, LCSW Clinical Social Worker    Expected Discharge Plan: Skilled Nursing Facility Barriers to Discharge: Continued Medical Work up  Expected Discharge Plan and Services In-house Referral: Clinical Social Work Discharge Planning Services: CM Consult   Living arrangements for the past 2 months: Single Family Home                   DME Agency: NA                   Social Determinants of Health (SDOH) Interventions SDOH Screenings   Food Insecurity: No Food Insecurity (03/13/2023)  Housing: Medium Risk (03/13/2023)  Transportation Needs: No Transportation Needs (03/13/2023)  Utilities: Not At Risk (03/13/2023)  Tobacco Use: Low Risk  (03/26/2023)    Readmission Risk Interventions     No data to display

## 2023-03-29 NOTE — Telephone Encounter (Signed)
Inbound call from patient's niece stated she was returning a phone call regarding patient's procedure results. Patient's niece requesting a call back. Please advise, thank you.

## 2023-03-29 NOTE — Evaluation (Signed)
Clinical/Bedside Swallow Evaluation Patient Details  Name: Sarah Wolf MRN: [id number] Date of Birth: Oct 04, 1934  Today's Date: 03/29/2023 Time: SLP Start Time (ACUTE ONLY): 1103 SLP Stop Time (ACUTE ONLY): 1115 SLP Time Calculation (min) (ACUTE ONLY): 12 min  Past Medical History: History reviewed. No pertinent past medical history. Past Surgical History:  Past Surgical History:  Procedure Laterality Date   ABDOMINAL AORTOGRAM W/LOWER EXTREMITY N/A 03/15/2023   Procedure: ABDOMINAL AORTOGRAM W/LOWER EXTREMITY;  Surgeon: Victorino Sparrow, MD;  Location: [facility] INVASIVE CV LAB;  Service: Cardiovascular;  Laterality: N/A;   ORIF FEMUR FRACTURE Right 03/08/2019   Procedure: OPEN REDUCTION INTERNAL FIXATION (ORIF) DISTAL FEMUR FRACTURE;  Surgeon: Kathryne Hitch, MD;  Location: [facility] ORS;  Service: Orthopedics;  Laterality: Right;   PERIPHERAL VASCULAR INTERVENTION Right 03/16/2023   Procedure: PERIPHERAL VASCULAR INTERVENTION;  Surgeon: Cephus Shelling, MD;  Location: [facility] INVASIVE CV LAB;  Service: Cardiovascular;  Laterality: Right;  SFA   PERIPHERAL VASCULAR THROMBECTOMY N/A 03/16/2023   Procedure: LYSIS RECHECK;  Surgeon: Cephus Shelling, MD;  Location: [facility] INVASIVE CV LAB;  Service: Cardiovascular;  Laterality: N/A;   HPI:  Patient is a 88 yo F w/ pertinent PMH PAF, HTN, DVT 2020 no longer on DOAC presents to [facility] on 8/26 w/ critical limb ischemia RLE.     Patient complaining of worsening BLE pain worse on right. Patient noted to be in afib RVR rates 140s. SBP 160s. BNP 799. DVT US no signs of dvt but occlusion of proximal mid SFA. 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 Vasc surgery and cards consulted. Started on heparin drip and given metoprolol. Started on diltiazem drip.  TRH admitted to floor.      On 8/28 patient underwent thrombolysis of RLE. That evening patient with worsening encephalopathy. Had aspiration event (unsure if related to PO intake or vomiting or secretions) and became more hypoxic and worsening AMS. intubated 8/28-8/30. EGD 03/25/23 "- Esophagogastric landmarks identified. - Large hiatal hernia. - Severe cicumferential esophagitis / ulceration with no bleeding. Biopsied. - Suspected long segment of Barrett' s esophagus - unable to be measured due to esophagitis, biopsies not taken. - Gastritis. Biopsied. - Normal stomach otherwise. - Non- bleeding duodenal ulcers with a clean ulcer base ( Forrest Class III) ."   Assessment / Plan / Recommendation  Clinical Impression  Pt seen for clinical swallowing evaluation. Pt presents with a mild oral dysphagia c/b prolonged mastication of select solid which is likely secondary to edentulism. Otherwise, oral phase was [facility].  Pharyngeal swallow appeared [facility] per clinical assessment. Suspect pt's swallow function is at or near baseline. Recommend continuation of a mech soft diet with thin liquids with safe swallowing strategies/aspiration precautions as outlined below. SLP to sign off as pt has no acute SLP needs at this time. SLP Visit Diagnosis: Dysphagia, oral phase (R13.11)    Aspiration Risk  Mild aspiration risk    Diet Recommendation Dysphagia 3 (Mech soft);Thin liquid    Medication Administration: Whole meds with puree Supervision: Patient able to self feed Compensations: Slow rate;Small sips/bites (thorough mastication of solids) Postural Changes: Seated upright at 90 degrees    Other  Recommendations Oral Care Recommendations: Oral care BID    Recommendations for follow up therapy are one component of a multi-disciplinary discharge planning process, led by the attending physician.  Recommendations may be updated based on patient status, additional functional criteria and insurance authorization.  Follow  up Recommendations Follow physician's recommendations for discharge plan and follow up therapies         Functional Status Assessment Patient has not had a recent decline in their functional status         Prognosis Prognosis for improved oropharyngeal function: Good      Swallow Study   General HPI: Patient is a 88 yo F w/ pertinent PMH PAF, HTN, DVT 2020 no longer on DOAC presents to [facility] on 8/26 w/ critical limb ischemia RLE.     Patient complaining of worsening BLE pain worse on right. Patient noted to be in afib RVR rates 140s. SBP 160s. BNP 799. DVT US no signs of dvt but occlusion of proximal mid SFA. 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 Vasc surgery and cards consulted. Started on heparin drip and given metoprolol. Started on diltiazem drip. TRH admitted to floor.      On 8/28 patient underwent thrombolysis of RLE. That evening patient with worsening encephalopathy. Had aspiration event (unsure if related to PO intake or vomiting or secretions) and became more hypoxic and worsening AMS. intubated 8/28-8/30. Type of Study: Bedside Swallow Evaluation Previous Swallow Assessment: seen this admission; last receommendation for Dysphagia 2 diet with thin liquids Diet Prior to this Study: Dysphagia 3 (mechanical soft);Thin liquids (Level 0) Temperature Spikes Noted: No Respiratory Status: Room air History of Recent Intubation: Yes Total duration of intubation (days): 3 days Date extubated: 03/17/23 Behavior/Cognition: Alert;Cooperative;Pleasant mood Oral Cavity Assessment: Within Functional Limits Oral Care Completed by SLP: No Oral Cavity - Dentition: Edentulous Vision: Functional for self-feeding Self-Feeding Abilities: Able to feed self Patient Positioning: Upright in  bed Baseline Vocal Quality: Hoarse;Low vocal intensity Volitional Cough: Strong Volitional Swallow: Able to elicit    Oral/Motor/Sensory Function Overall Oral Motor/Sensory Function: Within functional limits   Ice Chips Ice chips: Not tested   Thin Liquid Thin Liquid: Within functional limits Presentation: Straw;Self Fed    Nectar Thick Nectar Thick Liquid: Not tested   Honey Thick Honey Thick Liquid: Not tested   Puree Puree: Within functional limits Presentation: Self Fed   Solid     Solid: Impaired Oral Phase Impairments: Impaired mastication Oral Phase Functional Implications: Impaired mastication Pharyngeal Phase Impairments:  (WFL)     Clyde Canterbury, M.S., CCC-SLP Speech-Language Pathologist Secure Chat Preferred  O: [phone]  Woodroe Chen 03/29/2023,11:28 AM

## 2023-03-29 NOTE — Care Management Important Message (Signed)
Important Message  Patient Details  Name: Noon Sipp MRN: [id number] Date of Birth: 09/18/1934   Medicare Important Message Given:  Yes     Sherilyn Banker 03/29/2023, 2:52 PM

## 2023-03-30 ENCOUNTER — Encounter (HOSPITAL_COMMUNITY): Payer: Self-pay | Admitting: Gastroenterology

## 2023-03-30 DIAGNOSIS — I70221 Atherosclerosis of native arteries of extremities with rest pain, right leg: Secondary | ICD-10-CM | POA: Diagnosis not present

## 2023-03-30 LAB — BRAIN NATRIURETIC PEPTIDE: B Natriuretic Peptide: 564.2 pg/mL — ABNORMAL HIGH (ref 0.0–100.0)

## 2023-03-30 LAB — BASIC METABOLIC PANEL
Anion gap: 10 (ref 5–15)
BUN: 18 mg/dL (ref 8–23)
CO2: 26 mmol/L (ref 22–32)
Calcium: 8.2 mg/dL — ABNORMAL LOW (ref 8.9–10.3)
Chloride: 96 mmol/L — ABNORMAL LOW (ref 98–111)
Creatinine, Ser: 0.91 mg/dL (ref 0.44–1.00)
GFR, Estimated: >60 mL/min (ref >60)
Glucose, Bld: 138 mg/dL — ABNORMAL HIGH (ref 70–99)
Potassium: 2.9 mmol/L — ABNORMAL LOW (ref 3.5–5.1)
Sodium: 132 mmol/L — ABNORMAL LOW (ref 135–145)

## 2023-03-30 LAB — CBC
HCT: 32.6 % — ABNORMAL LOW (ref 36.0–46.0)
Hemoglobin: 10.7 g/dL — ABNORMAL LOW (ref 12.0–15.0)
MCH: 30.8 pg (ref 26.0–34.0)
MCHC: 32.8 g/dL (ref 30.0–36.0)
MCV: 93.9 fL (ref 80.0–100.0)
Platelets: 293 10*3/uL (ref 150–400)
RBC: 3.47 MIL/uL — ABNORMAL LOW (ref 3.87–5.11)
RDW: 15.1 % (ref 11.5–15.5)
WBC: 10.7 10*3/uL — ABNORMAL HIGH (ref 4.0–10.5)
nRBC: 0 % (ref 0.0–0.2)

## 2023-03-30 LAB — MAGNESIUM: Magnesium: 1.7 mg/dL (ref 1.7–2.4)

## 2023-03-30 LAB — PHOSPHORUS: Phosphorus: 1.5 mg/dL — ABNORMAL LOW (ref 2.5–4.6)

## 2023-03-30 MED ORDER — AMIODARONE HCL 200 MG PO TABS: 200.0000 mg | ORAL_TABLET | Freq: Two times a day (BID) | ORAL | Status: DC

## 2023-03-30 MED ORDER — METOPROLOL TARTRATE 100 MG PO TABS: 100.0000 mg | ORAL_TABLET | Freq: Two times a day (BID) | ORAL | Status: DC

## 2023-03-30 MED ORDER — MAGNESIUM SULFATE 2 GM/50ML IV SOLN
2.0000 g | Freq: Once | INTRAVENOUS | Status: AC
  Administered 2023-03-30: 2 g via INTRAVENOUS
  Filled 2023-03-30: qty 50

## 2023-03-30 MED ORDER — POTASSIUM CHLORIDE 20 MEQ PO PACK
60.0000 meq | PACK | Freq: Once | ORAL | Status: AC
  Administered 2023-03-30: 60 meq via ORAL
  Filled 2023-03-30: qty 3

## 2023-03-30 MED ORDER — APIXABAN 5 MG PO TABS: 5.0000 mg | ORAL_TABLET | Freq: Two times a day (BID) | ORAL | Status: DC

## 2023-03-30 MED ORDER — PANTOPRAZOLE SODIUM 40 MG PO TBEC: 40.0000 mg | DELAYED_RELEASE_TABLET | Freq: Two times a day (BID) | ORAL | Status: DC

## 2023-03-30 MED ORDER — SUCRALFATE 1 GM/10ML PO SUSP: 1.0000 g | Freq: Four times a day (QID) | ORAL | Status: DC

## 2023-03-30 MED ORDER — POTASSIUM PHOSPHATES 15 MMOLE/5ML IV SOLN
30.0000 mmol | Freq: Once | INTRAVENOUS | Status: AC
  Administered 2023-03-30: 30 mmol via INTRAVENOUS
  Filled 2023-03-30: qty 10

## 2023-03-30 MED ORDER — ATORVASTATIN CALCIUM 40 MG PO TABS: 40.0000 mg | ORAL_TABLET | Freq: Every day | ORAL | Status: DC

## 2023-03-30 NOTE — Discharge Summary (Signed)
Physician Discharge Summary  Sarah Wolf WUJ:[id number] DOB: 12/21/1934 DOA: 03/13/2023  PCP: Tally Joe, MD  Admit date: 03/13/2023 Discharge date: 03/30/2023  Admitted From: Home Disposition: [name]  Recommendations for Outpatient Follow-up:  Follow up with PCP in 1-2 weeks Outpatient follow-up with cardiology, gastroenterology, vascular surgery Consider outpatient referral to cardiothoracic surgery for mediastinal lesion Please obtain BMP/CBC in one week   Discharge Condition: Stable CODE STATUS: DNR Diet recommendation: Heart healthy/consistent carbohydrate diet, fluid restriction 1800 mL/day  History of present illness:  Sarah Wolf is a 88 y.o. female with past medical history significant for HTN, paroxysmal atrial fibrillation, history of DVT no longer on anticoagulation who presents to [facility] ED on 8/26 with complaint of bilateral lower extremity pain, greatest on right.  Onset morning of admission, pain all the way down her right leg severe nature.  Review of records note patient with left lower extremity DVT 2020 as well as echo noting A-fib at that time.  She was on Xarelto temporarily after being found to have a LLE DVT following a fall with femur fracture 03/08/2019.    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   Significant Hospital events: 8/26 admitted to [facility] 8/28 thrombolysis of RLE with SFA stent, worsening encephalopathy and possible aspiration event leading to worsening hypoxia; pccm consulted and required intubation; Tx to ICU.  8/30: Extubated 9/1: transferred back to hospitalist service; pending SNF placement 9/6: coffee ground emesis, GI consulted  9/7: EGD  deferred, dx PNA and started on cefepime  9/8: EGD found large hiatal hernia, severe circumferential esophagitis with ulceration without bleeding.  Suspected long segment of Barrett's esophagus, gastritis, nonbleeding duodenal ulcers.  Has multiple sites of possible bleeding, most likely from esophageal ulceration, duodenal ulcers in setting of anticoagulation.  Continue medical therapy with PPI.  Resume anticoagulation with heparin drip 9/11 heparin drip transition back to Eliquis 9/12: Hemoglobin remained stable, discharging to [facility] course:  Critical limb ischemia right lower extremity likely secondary to cardioembolic event CT angiogram notable for acute occlusion right SFA and left popliteal artery.  Vascular surgery performed angiogram/aortogram with peripheral thrombectomy with SFA stent.  Follow-up 4-6 weeks vascular surgery with RLE duplex/ABIs.  Continue Eliquis.   Upper GI bleed secondary to esophageal and duodenal ulcers Status post endoscopy 9/7 by GI noting esophageal and duodenal ulcers in the setting of anticoagulation.  Continue Protonix 40 mg p.o. twice daily.  Carafate 3 times daily AC/at bedtime x 2 weeks.  Outpatient follow-up with GI.   Sepsis, not POA, secondary to HCAP Concerns of pneumonia.  Currently on cefepime.  Will check procalcitonin.   Acute respiratory failure with hypoxia Suspect aspiration event Previously completed Unasyn, Rocephin and cefepime.  Oxygen now titrated off.   Acute encephalopathy: Resolved EEG, CT head and ammonia levels are negative   Paroxysmal atrial fibrillation with RVR Currently patient is on metoprolol, amiodarone, and Eliquis.  Outpatient follow-up with cardiology.   Acute on chronic systolic congestive heart failure EF is 45%.  Continue metoprolol.  Outpatient follow-up with cardiology.  Recommend maintaining daily weights.  Fluid restriction 1800 mL/day.   Hx DVT Eliquis   Type 2 diabetes mellitus Hemoglobin A1c 6.6.   Diet controlled.   Urinary retention  Resolved   Lesion of mediastinum Noted incidental finding on CT chest of nodule anterior mediastinum. Outpatient CT surgery follow-up   Weakness/debility/deconditioning: Seen by PT and OT during hospitalization, discharging to SNF for further debilitation.   Large diaphragmatic hernia Stable follow-up outpatient   Poor perfusion of her upper extremities Vascular Dopplers did not show any obvious obstruction  Discharge Diagnoses:  Principal Problem:   Critical limb ischemia of right lower extremity (HCC) Active Problems:   Atrial fibrillation with RVR (HCC)   Thrombus of right atrial appendage   Chronic HFpEF   Myocardial injury due to Afib   History of DVT (deep vein thrombosis)   Lesion of mediastinum   Hyponatremia   Acute respiratory failure with hypoxia (HCC)   Goals of care, counseling/discussion   Aspiration into airway   Upper GI bleed   Anticoagulated   Acute esophagitis   Gastritis and gastroduodenitis   Duodenal ulcer    Discharge Instructions  Discharge Instructions     Amb referral to AFIB Clinic   Complete by: As directed    Call MD for:  difficulty breathing, headache or visual disturbances   Complete by: As directed    Call MD for:  extreme fatigue   Complete by: As directed    Call MD for:  persistant dizziness or light-headedness   Complete by: As directed    Call MD for:  persistant nausea and vomiting   Complete by: As directed    Call MD for:  severe uncontrolled pain   Complete by: As directed    Call MD for:  temperature >100.4   Complete by: As directed    Diet - low sodium heart healthy   Complete by: As directed    Increase activity slowly   Complete by: As directed       Allergies as of 03/30/2023   No Known Allergies      Medication List     STOP taking these medications    carvedilol 3.125 MG tablet Commonly known as: COREG       TAKE these medications    amiodarone 200  MG tablet Commonly known as: PACERONE Take 1 tablet (200 mg total) by mouth 2 (two) times daily.   apixaban 5 MG Tabs tablet Commonly known as: ELIQUIS Take 1 tablet (5 mg total) by mouth 2 (two) times daily.   atorvastatin 40 MG tablet Commonly known as: LIPITOR Take 1 tablet (40 mg total) by mouth daily. Start taking on: March 31, 2023   metoprolol tartrate 100 MG tablet Commonly known as: LOPRESSOR Take 1 tablet (100 mg total) by mouth 2 (two) times daily.   pantoprazole 40 MG tablet Commonly known as: Protonix Take 1 tablet (40 mg total) by mouth 2 (two) times daily.   sucralfate 1 GM/10ML suspension Commonly known as: CARAFATE Take 10 mLs (1 g total) by mouth every 6 (six) hours for 14 days.        Follow-up Information     Pricilla Riffle, MD Follow up.   Specialty: Cardiology Why: Humberto Seals - office will call you to arrange cardiology follow-up Contact information: [address] [place] [address] [phone]                No Known Allergies  Consultations: Cardiology Vascular surgery Gastroenterology   Procedures/Studies: DG CHEST PORT 1 VIEW  Result Date: 03/26/2023 CLINICAL DATA:  PICC line placement. EXAM: PORTABLE CHEST 1 VIEW COMPARISON:  March 24, 2023 FINDINGS:  Left PICC line terminates at the expected location of the superior vena cava. No evidence of pneumothorax. Enlarged cardiac silhouette. Elevation of the left hemidiaphragm. Small right pleural effusion. Consolidation versus atelectasis in the left lower lobe. IMPRESSION: 1. Left PICC line terminates at the expected location of the superior vena cava. 2. Small right pleural effusion. 3. Consolidation versus atelectasis in the left lower lobe. Electronically Signed   By: Ted Mcalpine M.D.   On: 03/26/2023 15:34   [place] EKG SITE RITE  Result Date: 03/26/2023 If Site Rite image not attached, placement could not be confirmed due to current cardiac  rhythm.  VAS [place] DOP BILAT COMP TOS DIGITS REYNAUD  Result Date: 03/24/2023 UPPER EXTREMITY DOPPLER STUDY Patient Name:  Sarah Wolf  Date of Exam:   03/23/2023 Medical Rec #: [id number]   Accession #:    [id number] Date of Birth: 1935-05-15   Patient Gender: F Patient Age:   71 years Exam Location:  [facility] Procedure:      VAS UE DOPPLER BILAT/COMP TOS, DIGITS (TO&UE REYNAUDS) Referring Phys: JENNIFER CHOI --------------------------------------------------------------------------------  Indications: Bilateral dusky/purple fingers. History:     PVD.  Risk Factors:  Hypertension, hyperlipidemia. Other Factors: History of DVT Performing Technologist: Marilynne Halsted RDMS, RVT  Examination Guidelines: A complete evaluation includes B-mode imaging, spectral Doppler, color Doppler, and power Doppler as needed of all accessible portions of each vessel. Bilateral testing is considered an integral part of a complete examination. Limited examinations for reoccurring indications may be performed as noted.  Right Doppler Findings: +-----------+--------+-----+-------------------------+-------------------------+ Site       PressureIndexDoppler                  Comments                  +-----------+--------+-----+-------------------------+-------------------------+ Subclavian              triphasic                                          +-----------+--------+-----+-------------------------+-------------------------+ Axillary                triphasic                                          +-----------+--------+-----+-------------------------+-------------------------+ Brachial   155          triphasic                                          +-----------+--------+-----+-------------------------+-------------------------+ Forearm                 triphasic                                          +-----------+--------+-----+-------------------------+-------------------------+ Radial                   triphasic                                          +-----------+--------+-----+-------------------------+-------------------------+  Ulnar                   triphasic                                          +-----------+--------+-----+-------------------------+-------------------------+ Palmar Arch                                      Patent                    +-----------+--------+-----+-------------------------+-------------------------+ Digit                   Severely dampened        unable to obtain pressure                         waveforms                due to dampened waveforms +-----------+--------+-----+-------------------------+-------------------------+  Left Doppler Findings: +-----------+--------+-----+---------------------------+------------+ Site       PressureIndexDoppler                    Comments     +-----------+--------+-----+---------------------------+------------+ Subclavian              triphasic                               +-----------+--------+-----+---------------------------+------------+ Axillary                triphasic                               +-----------+--------+-----+---------------------------+------------+ Brachial   158          triphasic                               +-----------+--------+-----+---------------------------+------------+ Forearm                 triphasic                               +-----------+--------+-----+---------------------------+------------+ Radial                  triphasic                               +-----------+--------+-----+---------------------------+------------+ Ulnar                   triphasic                               +-----------+--------+-----+---------------------------+------------+ Palmar Arch                                        Patent       +-----------+--------+-----+---------------------------+------------+ Digit      117      0.74 Severely dampened waveformsall 5 digits +-----------+--------+-----+---------------------------+------------+  Summary:  Right: No significant  arterial obstruction detected in the right        upper extremity. Severely dampened waveforms in all 5 digits. Left: No significant arterial obstruction detected in the left upper       extremity. Severely dampened waveforms in all 5 digits. *See table(s) above for measurements and observations. Electronically signed by Gerarda Fraction on 03/24/2023 at 8:45:40 AM.    Final    DG CHEST PORT 1 VIEW  Result Date: 03/24/2023 CLINICAL DATA:  Chest pain EXAM: PORTABLE CHEST 1 VIEW COMPARISON:  03/21/2023 FINDINGS: Large hiatal hernia in the left lower chest. Bibasilar airspace opacities, favor atelectasis. Small bilateral pole effusions. Heart is mildly enlarged. Aortic atherosclerosis. Mediastinal contours within the normal limits. No acute bony abnormality. IMPRESSION: Large hiatal hernia. Small bilateral pleural effusions, bibasilar atelectasis. Electronically Signed   By: Charlett Nose M.D.   On: 03/24/2023 02:08   DG Abd 1 View  Result Date: 03/22/2023 CLINICAL DATA:  Vomiting. EXAM: ABDOMEN - 1 VIEW COMPARISON:  None Available. FINDINGS: No abnormal bowel dilatation.  Possible left renal calculus. IMPRESSION: No abnormal bowel dilatation.  Possible left renal calculus. Aortic Atherosclerosis (ICD10-I70.0). Electronically Signed   By: Lupita Raider M.D.   On: 03/22/2023 08:58   DG CHEST PORT 1 VIEW  Result Date: 03/21/2023 CLINICAL DATA:  Short of breath EXAM: PORTABLE CHEST 1 VIEW COMPARISON:  Radiograph 03/17/2023 FINDINGS: Interval extubation. Stable large cardiac silhouette. Interval increase in LEFT pleural effusion. There is a large LEFT diaphragmatic hernia with transverse colon herniated into the LEFT lower chest. Mild atelectasis versus airspace disease in the RIGHT lower lobe similar. No pneumothorax. IMPRESSION: 1. Increase in LEFT pleural effusion.  2. Large diaphragmatic hernia with LEFT colon herniated the LEFT chest. 3. Increase in RIGHT basilar atelectasis and effusion. Electronically Signed   By: Genevive Bi M.D.   On: 03/21/2023 15:04   EEG adult  Result Date: 03/17/2023 Charlsie Quest, MD     03/17/2023 11:02 PM Patient Name: Sarah Wolf MRN: [id number] Epilepsy Attending: Charlsie Quest Referring Physician/Provider: Duayne Cal, NP Date: 03/17/2023 Duration: 22.36 mins Patient history: 88yo F with ams getting eeg to evaluate for seizure Level of alertness: Awake AEDs during EEG study: None Technical aspects: This EEG study was done with scalp electrodes positioned according to the 10-20 International system of electrode placement. Electrical activity was reviewed with band pass filter of 1-70Hz , sensitivity of 7 uV/mm, display speed of 78mm/sec with a 60Hz  notched filter applied as appropriate. EEG data were recorded continuously and digitally stored.  Video monitoring was available and reviewed as appropriate. Description: EEG showed continuous generalized predominantly 5 to 6 Hz theta admixed with 2-3Hz  delta slowing. Hyperventilation and photic stimulation were not performed.   ABNORMALITY - Continuous slow, generalized IMPRESSION: This study is suggestive of moderate diffuse encephalopathy. No seizures or epileptiform discharges were seen throughout the recording. Charlsie Quest   DG CHEST PORT 1 VIEW  Result Date: 03/17/2023 CLINICAL DATA:  Aspiration EXAM: PORTABLE CHEST 1 VIEW COMPARISON:  03/16/2023 FINDINGS: Unchanged AP portable chest radiograph. Endotracheal tube positioned over the midtrachea. Esophagogastric tube with tip and side port positioned within a large left-sided paraesophageal hernia. Cardiomegaly with unchanged layering pleural effusions and mild diffuse interstitial opacity. No focal airspace opacity. Osseous structures unremarkable. IMPRESSION: 1. Esophagogastric tube with tip and side port positioned within  a large left-sided paraesophageal hernia. 2. Endotracheal tube positioned over the midtrachea. 3. Cardiomegaly with unchanged layering pleural effusions and mild diffuse interstitial opacity,  consistent with edema. No focal airspace opacity. Electronically Signed   By: Jearld Lesch M.D.   On: 03/17/2023 08:47   PERIPHERAL VASCULAR CATHETERIZATION  Result Date: 03/16/2023 Images from the original result were not included.   Patient name: Sarah Wolf         MRN: [id number]        DOB: [birth year]          Sex: female  03/16/2023 Pre-operative Diagnosis: Acute on chronic limb ischemia right lower extremity Post-operative diagnosis:  Same Surgeon:  Cephus Shelling, MD Assistant: Carolynn Sayers, MD Procedure Performed: 1.  Thrombolysis catheter check right lower extremity 2.  Right SFA stenting with angioplasty x 2 (6 mm x 5 cm Viabahn and 6 mm x 40 drug-coated Eluvia) 3.  Right lower extremity arteriogram 4.  Perclose closure of left common femoral artery  Indications: 88 year old female presented with likely acute on chronic limb ischemia of the right lower extremity on Monday night.  She underwent placement of a thrombolytics catheter yesterday.  She now presents for thrombolytics catheter check of the right lower extremity after risks and benefits discussed with family.  Findings:  A V18 wire was placed down the right leg UniFuse catheter.  The common femoral and profunda are now widely patent.  The SFA is patent and there were two lesions in the SFA.  One was a focal high-grade stenosis over 90% in the proximal SFA with some associated acute thrombus that was treated with a 6 mm x 5 cm Viabahn.  Distally there was a second 50% stenosis in the SFA treated with a 6 mm x 40 mm drug-coated Eluvia.  She now had inline flow into the foot through the posterior tibial with filling of the anterior tibial although this is occluded proximally.             Procedure:  The patient was taken to room 8 from the ICU intubated.   The patient was then placed supine on the table and prepped and draped in the usual sterile fashion.  A time out was called.  A V18 wire was used to place down the UniFuse catheter in the right leg and the UniFuse catheter was removed.  We gave additional 2000 units of IV heparin.  ACT was checked to maintain goal >250 and additional heparin was given during the case.  We then got hand-injection of the right lower extremity from the sheath in the left groin over the aortic bifurcation to include lower extremity runoff.  Ultimately she now had inline flow through the posterior tibial.  There were multiple lesions identified in the SFA as noted above.  The first SFA lesion that was high grade >90% was treated with a 6 mm x 5 cm Viabahn postdilated with a 5 mm balloon.  There was a second lesion more distal in the SFA and this was treated with a 6 mm x 40 mm drug-coated Eluvia postdilated with a 5 mm balloon for a >50% stenosis.  She now has inline flow down the posterior tibial.  Wires and catheters were removed.  A Perclose device was placed in the left groin.  Taken back to the ICU intubated.   Plan: Resume heparin in 6 hours after sheath removal.   Cephus Shelling, MD [facility] Office: [phone]   DG CHEST PORT 1 VIEW  Result Date: 03/16/2023 CLINICAL DATA:  Acute congestive heart failure. EXAM: PORTABLE CHEST 1 VIEW COMPARISON:  March 15, 2023.  FINDINGS: Mild cardiomegaly is noted. Endotracheal and nasogastric tubes are unchanged in position. Increased bilateral perihilar and basilar opacities are noted concerning for worsening edema or atelectasis with increased associated pleural effusions. Bony thorax is unremarkable. IMPRESSION: Stable support apparatus. Increased bilateral edema or atelectasis with associated pleural effusions. Electronically Signed   By: Lupita Raider M.D.   On: 03/16/2023 10:59   CT HEAD WO CONTRAST ( )  Result Date:  03/16/2023 CLINICAL DATA:  Altered mental status EXAM: CT HEAD WITHOUT CONTRAST TECHNIQUE: Contiguous axial images were obtained from the base of the skull through the vertex without intravenous contrast. RADIATION DOSE REDUCTION: This exam was performed according to the departmental dose-optimization program which includes automated exposure control, adjustment of the mA and/or kV according to patient size and/or use of iterative reconstruction technique. COMPARISON:  None Available. FINDINGS: Brain: There is no mass, hemorrhage or extra-axial collection. The size and configuration of the ventricles and extra-axial CSF spaces are normal. There is hypoattenuation of the white matter, most commonly indicating chronic small vessel disease. Vascular: No abnormal hyperdensity of the major intracranial arteries or dural venous sinuses. No intracranial atherosclerosis. Skull: The visualized skull base, calvarium and extracranial soft tissues are normal. Sinuses/Orbits: No fluid levels or advanced mucosal thickening of the visualized paranasal sinuses. No mastoid or middle ear effusion. The orbits are normal. IMPRESSION: Chronic small vessel disease without acute intracranial abnormality. Electronically Signed   By: Deatra Robinson M.D.   On: 03/16/2023 00:53   Portable Chest x-ray  Result Date: 03/15/2023 CLINICAL DATA:  Check endotracheal tube placement EXAM: PORTABLE CHEST 1 VIEW COMPARISON:  Film from earlier in the same day. FINDINGS: Cardiac shadow is within normal limits. Aortic calcifications are noted. Endotracheal tube is noted in satisfactory position. Gastric catheter is seen in the stomach beneath an elevated left hemidiaphragm. Mild vascular congestion remains. IMPRESSION: Stable vascular congestion. Tubes and lines as described. Electronically Signed   By: Alcide Clever M.D.   On: 03/15/2023 22:37   DG CHEST PORT 1 VIEW  Result Date: 03/15/2023 CLINICAL DATA:  Respiratory distress EXAM: PORTABLE CHEST 1  VIEW COMPARISON:  03/13/2023 FINDINGS: Cardiac shadow is enlarged. Aortic calcifications are noted. Ingested tablets are noted within the stomach. Central vascular congestion is noted without significant interstitial edema. No sizable effusion is noted. No bony abnormality is noted. IMPRESSION: Mild CHF. Electronically Signed   By: Alcide Clever M.D.   On: 03/15/2023 21:45   PERIPHERAL VASCULAR CATHETERIZATION  Result Date: 03/15/2023 Images from the original result were not included. Patient name: Sarah Wolf MRN: [id number] DOB: [birth year] Sex: female 03/15/2023 Pre-operative Diagnosis: [place] 1 acute limb ischemia in the right leg Post-operative diagnosis:  Same Surgeon:  Victorino Sparrow, MD Procedure Performed: 1.  Ultrasound-guided micropuncture access of the left common femoral artery 2.  Aortogram 3.  Second-order cannulation, right lower extremity angiogram 4.  Third cannulation, right lower extremity angiogram 5.  Initiation of thrombolysis therapy through a 50 cm UniFuse catheter spanning from the common femoral artery through the popliteal artery. 6.  Moderate sedation time 33 minutes, contrast volume 60 mL Indications: Patient is an 88 year old female with recent diagnosis of atrial fibrillation who presented with bilateral lower extremity pain which abated with heparinization.  CTA demonstrated occluded superficial femoral artery in the right lower extremity with significant atherosclerotic disease.  ABI demonstrated no toe pressure in the right lower extremity.  Right foot pain returned.  Discussed right lower extremity angiogram with possible intervention in an effort to  improve distal perfusion to alleviate rest pain. Findings: Aortogram: Bilateral renal arteries patent.  No flow-limiting stenosis in the aortoiliac segments bilaterally On the right: Widely patent common femoral artery, widely patent profunda with a branch occluded from thrombus.  Superficial femoral artery is patent for 4 cm prior  to occlusion.  There is reconstitution of the mid SFA continuing into the P1 segment of the popliteal artery.  The P2 P3 segment of the popliteal artery are occluded.  The ostia of the anterior tibial artery is occluded with reconstitution of collaterals at the mid tibia.  No outflow into the dorsalis pedis.  The peroneal artery appears to reconstitute via collaterals with no outflow, and the posterior tibial artery appears to continue to the foot with minimal outflow in the plantar arteries.  The plantar arteries appear to be trashed from embolic debris.  Procedure:  The patient was identified in the holding area and taken to room 8.  The patient was then placed supine on the table and prepped and draped in the usual sterile fashion.  A time out was called.  Ultrasound was used to evaluate the left common femoral artery.  It was patent .  A digital ultrasound image was acquired.  A micropuncture needle was used to access the right common femoral artery under ultrasound guidance.  An 018 wire was advanced without resistance and a micropuncture sheath was placed.  The 018 wire was removed and a benson wire was placed.  The micropuncture sheath was exchanged for a 5 french sheath.  An omniflush catheter was advanced over the wire to the level of L-1.  An abdominal angiogram was obtained.  Next, using the omniflush catheter and a benson wire, the aortic bifurcation was crossed and the catheter was placed into theright external iliac artery and right runoff was obtained.  See results above. 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  Being that this cardioembolic event was recent, I felt Marena would be best treated with thrombolytic therapy.  A 50 cm UniFuse catheter was brought onto the field and parked in the distal portion of the P3 segment of the popliteal artery.  Angiography followed from the distal aspect demonstrating single-vessel posterior tibial outflow  to the level of the ankle with no flow in the foot.  I elected to proceed with initiation of thrombolysis therapy. 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  This pathology has a high rate of limb loss.  Being that this cardioembolic event was recent, and Estephany has no history of recent fall, bleed, surgery, brain malignancy, Skyleigh would be best treated with thrombolytic therapy.  This was initiated at 1 mg/h Fara Olden, MD [facility] [place] [facility] Office: [phone]   VAS [place] ABI WITH/WO TBI  Result Date: 03/14/2023  LOWER EXTREMITY DOPPLER STUDY Patient Name:  Sarah Wolf  Date of Exam:   03/14/2023 Medical Rec #: [id number]   Accession #:    [id number] Date of Birth: 1934/09/07   Patient Gender: F Patient Age:   3 years Exam Location:  [facility] Procedure:      VAS [place] ABI WITH/WO TBI Referring Phys: Lemar Livings --------------------------------------------------------------------------------  Indications: SFA occlusion found incidentally on venous examination for right              leg pain yesterday. Other Factors: Atrial fibrillation.  Vascular Interventions: No prior interventions. Limited prior history. Provoked  right peroneal DVT s/p surgery in 2020. Limitations: Today's exam was limited due to involuntary patient movement. Comparison Study: CTA aorta bifemoral performed yesterday for finding of SFA                   occlusion on venous ultrasound.                   "Occlusion right superficial femoral artery beginning several                   cm beyond the origin in extending into the distal thigh.                   Complete occlusion of the left popliteal artery beginning at                   the P3 segment and extending through the trifurcation." Performing Technologist: Jean Rosenthal RDMS RVT  Examination Guidelines: A complete evaluation includes at minimum, Doppler waveform signals and  systolic blood pressure reading at the level of bilateral brachial, anterior tibial, and posterior tibial arteries, when vessel segments are accessible. Bilateral testing is considered an integral part of a complete examination. Photoelectric Plethysmograph (PPG) waveforms and toe systolic pressure readings are included as required and additional duplex testing as needed. Limited examinations for reoccurring indications may be performed as noted.  ABI Findings: +---------+------------------+-----+---------+--------+ Right    Rt Pressure (mmHg)IndexWaveform Comment  +---------+------------------+-----+---------+--------+ Brachial 137                    triphasic         +---------+------------------+-----+---------+--------+ DP       0                 0.00 absent            +---------+------------------+-----+---------+--------+ Great Toe0                 0.00 Absent            +---------+------------------+-----+---------+--------+ +---------+------------------+-----+----------+-------+ Left     Lt Pressure (mmHg)IndexWaveform  Comment +---------+------------------+-----+----------+-------+ Brachial 133                    triphasic         +---------+------------------+-----+----------+-------+ PTA      77                0.56 monophasic        +---------+------------------+-----+----------+-------+ DP       69                0.50 monophasic        +---------+------------------+-----+----------+-------+ Great Toe21                0.15 Abnormal          +---------+------------------+-----+----------+-------+  Summary: Right: Resting right ankle-brachial index indicates critical limb ischemia. Left: Resting left ankle-brachial index indicates moderate left lower extremity arterial disease. The left toe-brachial index is abnormal. *See table(s) above for measurements and observations.  Electronically signed by Waverly Ferrari MD on 03/14/2023 at 3:05:09 PM.     Final    VAS [place] LOWER EXTREMITY ARTERIAL DUPLEX  Result Date: 03/14/2023 LOWER EXTREMITY ARTERIAL DUPLEX STUDY Patient Name:  Sarah Wolf  Date of Exam:   03/14/2023 Medical Rec #: [id number]   Accession #:    [id number] Date of Birth: December 28, 1934   Patient Gender: F Patient Age:   71 years Exam  [facility]  [facility] Procedure:      VAS [place] LOWER EXTREMITY ARTERIAL DUPLEX Referring Phys: Lemar Livings --------------------------------------------------------------------------------  Indications: SFA occlusion found incidentally on venous examination for right              leg pain yesterday. Other Factors: Atrial fibrillation.  Vascular Interventions: No prior interventions. Limited prior history. Provoked                         right peroneal DVT s/p surgery in 2020. Current ABI:            RT 0.0/0.0, LT 0.56/0.15 Limitations: Involuntary patient movement. Comparison Study: CTA aorta bifemoral performed yesterday for finding of SFA                   occlusion.                   "Occlusion right superficial femoral artery beginning several                   cm beyond the origin in extending into the distal thigh.                   Complete occlusion of the left popliteal artery beginning at                   the P3 segment and extending through the trifurcation." Performing Technologist: Jean Rosenthal RDMS, RVT  Examination Guidelines: A complete evaluation includes B-mode imaging, spectral Doppler, color Doppler, and power Doppler as needed of all accessible portions of each vessel. Bilateral testing is considered an integral part of a complete examination. Limited examinations for reoccurring indications may be performed as noted.  +-----------+--------+-----+--------+-------------------+----------------------+ RIGHT      PSV cm/sRatioStenosisWaveform           Comments               +-----------+--------+-----+--------+-------------------+----------------------+ CFA Prox   78                    triphasic                                 +-----------+--------+-----+--------+-------------------+----------------------+ CFA Mid    70                   triphasic                                 +-----------+--------+-----+--------+-------------------+----------------------+ CFA Distal 29                   biphasic                                  +-----------+--------+-----+--------+-------------------+----------------------+ DFA        102                  triphasic                                 +-----------+--------+-----+--------+-------------------+----------------------+ SFA Prox   13                   monophasic  pre-occlusive          +-----------+--------+-----+--------+-------------------+----------------------+ SFA Mid                 occluded                                          +-----------+--------+-----+--------+-------------------+----------------------+ SFA Distal 24                   monophasic         reconstituted via                                                         collateral             +-----------+--------+-----+--------+-------------------+----------------------+ POP Prox                                           Unable to obtain                                                          signal- venous                                                            interference and                                                          patient movement       +-----------+--------+-----+--------+-------------------+----------------------+ POP Mid    14                   monophasic                                +-----------+--------+-----+--------+-------------------+----------------------+ POP Distal 7                    dampened monophasic                       +-----------+--------+-----+--------+-------------------+----------------------+ ATA Prox   8                     dampened monophasic                       +-----------+--------+-----+--------+-------------------+----------------------+ ATA Mid    6                    dampened monophasic                       +-----------+--------+-----+--------+-------------------+----------------------+  ATA Distal                                         Unable to obtain                                                          signal                 +-----------+--------+-----+--------+-------------------+----------------------+ PTA Prox   17                   dampened monophasic                       +-----------+--------+-----+--------+-------------------+----------------------+ PTA Mid    6                    dampened monophasic                       +-----------+--------+-----+--------+-------------------+----------------------+ PTA Distal 4                    dampened monophasic                       +-----------+--------+-----+--------+-------------------+----------------------+ PERO Prox                                          Unable to obtain                                                          signal                 +-----------+--------+-----+--------+-------------------+----------------------+ PERO Mid                                           Unable to obtain                                                          signal                 +-----------+--------+-----+--------+-------------------+----------------------+ PERO Distal                                        Unable to obtain  signal                 +-----------+--------+-----+--------+-------------------+----------------------+ DP                                                 Unable to obtain                                                          signal                  +-----------+--------+-----+--------+-------------------+----------------------+  +-----------+--------+-----+--------+-------------------+----------------------+ LEFT       PSV cm/sRatioStenosisWaveform           Comments               +-----------+--------+-----+--------+-------------------+----------------------+ CFA Prox   104                  triphasic                                 +-----------+--------+-----+--------+-------------------+----------------------+ CFA Mid    82                   triphasic                                 +-----------+--------+-----+--------+-------------------+----------------------+ CFA Distal 76                   triphasic                                 +-----------+--------+-----+--------+-------------------+----------------------+ DFA        52                   triphasic                                 +-----------+--------+-----+--------+-------------------+----------------------+ SFA Prox   60                   triphasic                                 +-----------+--------+-----+--------+-------------------+----------------------+ SFA Mid    69                   triphasic                                 +-----------+--------+-----+--------+-------------------+----------------------+ SFA Distal 102                  triphasic                                 +-----------+--------+-----+--------+-------------------+----------------------+ POP Prox   71  triphasic                                 +-----------+--------+-----+--------+-------------------+----------------------+ POP Mid    8                    monophasic         Pre-occlusive          +-----------+--------+-----+--------+-------------------+----------------------+ POP Distal              occluded                                          +-----------+--------+-----+--------+-------------------+----------------------+ ATA  Prox   15                   monophasic                                +-----------+--------+-----+--------+-------------------+----------------------+ ATA Mid    9                    monophasic                                +-----------+--------+-----+--------+-------------------+----------------------+ ATA Distal 12                   monophasic                                +-----------+--------+-----+--------+-------------------+----------------------+ PTA Prox                occluded                   Reconstituted via                                                         collateral             +-----------+--------+-----+--------+-------------------+----------------------+ PTA Mid    21                   monophasic                                +-----------+--------+-----+--------+-------------------+----------------------+ PTA Distal 19                   monophasic                                +-----------+--------+-----+--------+-------------------+----------------------+ PERO Prox                                          Unable to obtain  signal                 +-----------+--------+-----+--------+-------------------+----------------------+ PERO Mid   9                    dampened monophasic                       +-----------+--------+-----+--------+-------------------+----------------------+ PERO Distal5                    dampened monophasic                       +-----------+--------+-----+--------+-------------------+----------------------+ DP         12                   monophasic                                +-----------+--------+-----+--------+-------------------+----------------------+  Summary: Right: Total occlusion noted in the superficial femoral artery. Reconstitution of the distal SFA via collateral. Dampened flow noted in the posterior tibial and  anterior tibial ateries. Unable to detect flow in the peroneal artery. Left: Total occlusion noted in the popliteal artery. Reconstitution of the proximal PTA via collateral. Monophasic flow noted throughout the anterior tibial and peroneal arteries.  See table(s) above for measurements and observations. Electronically signed by Waverly Ferrari MD on 03/14/2023 at 3:04:15 PM.    Final    ECHOCARDIOGRAM COMPLETE  Result Date: 03/14/2023    ECHOCARDIOGRAM REPORT   Patient Name:   Sarah Wolf Date of Exam: 03/14/2023 Medical Rec #:  [id number]  Height:       65.0 in Accession #:    [id number] Weight:       131.4 lb Date of Birth:  Nov 15, 1934  BSA:          1.655 m Patient Age:    88 years   BP:           153/80 mmHg Patient Gender: F          HR:           100 bpm. Exam Location:  Inpatient Procedure: 2D Echo, Cardiac Doppler and Color Doppler Indications:    Arhythmia  History:        Patient has prior history of Echocardiogram examinations, most                 recent 03/09/2019. Arrythmias:Atrial Fibrillation.  Sonographer:    Darlys Gales Referring Phys: [id number] RONDELL A SMITH IMPRESSIONS  1. Left ventricular ejection fraction, by estimation, is 40 to 45%. The left ventricle has mildly decreased function. Left ventricular endocardial border not optimally defined to evaluate regional wall motion. Left ventricular diastolic function could not be evaluated.  2. Right ventricular systolic function was not well visualized. The right ventricular size is dilated.  3. A small pericardial effusion is present. The pericardial effusion is surrounding the apex.  4. The mitral valve is grossly normal. Trivial mitral valve regurgitation. No evidence of mitral stenosis.  5. The aortic valve was not well visualized. Aortic valve regurgitation is not visualized.  6. Cannot exclude a small PFO.  7. Technically difficult study. Comparison(s): Unable to view prior study. LVEF decreased from prior reporting. FINDINGS  Left  Ventricle: Left ventricular ejection fraction, by estimation, is 40 to 45%. The left ventricle has mildly decreased function. Left ventricular endocardial border  not optimally defined to evaluate regional wall motion. The left ventricular internal cavity size was normal in size. There is no left ventricular hypertrophy. Left ventricular diastolic function could not be evaluated due to atrial fibrillation. Left ventricular diastolic function could not be evaluated. Right Ventricle: The right ventricular size is dilated. No increase in right ventricular wall thickness. Right ventricular systolic function was not well visualized. Left Atrium: Left atrial size was normal in size. Right Atrium: Right atrial size was normal in size. Pericardium: A small pericardial effusion is present. The pericardial effusion is surrounding the apex. Mitral Valve: The mitral valve is grossly normal. Trivial mitral valve regurgitation. No evidence of mitral valve stenosis. Tricuspid Valve: The tricuspid valve is not well visualized. Tricuspid valve regurgitation is not demonstrated. Aortic Valve: The aortic valve was not well visualized. Aortic valve regurgitation is not visualized. Pulmonic Valve: The pulmonic valve was not well visualized. Pulmonic valve regurgitation is not visualized. No evidence of pulmonic stenosis. Aorta: The aortic root and ascending aorta are structurally normal, with no evidence of dilitation. IAS/Shunts: Cannot exclude a small PFO.  LEFT VENTRICLE PLAX 2D LVIDd:         3.70 cm   Diastology LVIDs:         2.80 cm   LV e' medial:    3.70 cm/s LV PW:         0.90 cm   LV E/e' medial:  26.1 LV IVS:        0.70 cm   LV e' lateral:   5.55 cm/s LVOT diam:     1.90 cm   LV E/e' lateral: 17.4 LV SV:         41 LV SV Index:   25 LVOT Area:     2.84 cm  LEFT ATRIUM             Index        RIGHT ATRIUM           Index LA Vol (A2C):   44.7 ml 27.01 ml/m  RA Area:     13.40 cm LA Vol (A4C):   50.7 ml 30.64 ml/m  RA  Volume:   28.80 ml  17.41 ml/m LA Biplane Vol: 49.0 ml 29.61 ml/m  AORTIC VALVE LVOT Vmax:   94.80 cm/s LVOT Vmean:  62.600 cm/s LVOT VTI:    0.145 m MITRAL VALVE               TRICUSPID VALVE MV Area (PHT): 4.60 cm    TR Peak grad:   13.7 mmHg MV Decel Time: 165 msec    TR Vmax:        185.00 cm/s MV E velocity: 96.70 cm/s                            SHUNTS                            Systemic VTI:  0.14 m                            Systemic Diam: 1.90 cm Riley Lam MD Electronically signed by Riley Lam MD Signature Date/Time: 03/14/2023/11:56:26 AM    Final    VAS [place] LOWER EXTREMITY VENOUS (DVT) (ONLY [facility] & [facility])  Result Date: 03/13/2023  Lower Venous DVT Study Patient Name:  Sarah Wolf  Date of Exam:   03/13/2023 Medical Rec #: [id number]   Accession #:    [id number] Date of Birth: 10-01-1934   Patient Gender: F Patient Age:   8 years Exam Location:  [facility] Procedure:      VAS [place] LOWER EXTREMITY VENOUS (DVT) Referring Phys: Jomarie Longs STEVENS --------------------------------------------------------------------------------  Indications: Right leg pain x1 day.  Comparison Study: 03-29-2019 Prior right lower extremity venous study s/p                   surgery was negative for DVT.                    No other prior history. Performing Technologist: Jean Rosenthal RDMS, RVT  Examination Guidelines: A complete evaluation includes B-mode imaging, spectral Doppler, color Doppler, and power Doppler as needed of all accessible portions of each vessel. Bilateral testing is considered an integral part of a complete examination. Limited examinations for reoccurring indications may be performed as noted. The reflux portion of the exam is performed with the patient in reverse Trendelenburg.  +---------+---------------+---------+-----------+----------+--------------+ RIGHT    CompressibilityPhasicitySpontaneityPropertiesThrombus Aging  +---------+---------------+---------+-----------+----------+--------------+ CFV      Full           Yes      Yes                                 +---------+---------------+---------+-----------+----------+--------------+ SFJ      Full                                                        +---------+---------------+---------+-----------+----------+--------------+ FV Prox  Full                                                        +---------+---------------+---------+-----------+----------+--------------+ FV Mid   Full                                                        +---------+---------------+---------+-----------+----------+--------------+ FV DistalFull                                                        +---------+---------------+---------+-----------+----------+--------------+ PFV      Full                                                        +---------+---------------+---------+-----------+----------+--------------+ POP      Full           Yes      Yes                                 +---------+---------------+---------+-----------+----------+--------------+  PTV      Full                                                        +---------+---------------+---------+-----------+----------+--------------+ PERO     Full                                                        +---------+---------------+---------+-----------+----------+--------------+   +----+---------------+---------+-----------+----------+--------------+ LEFTCompressibilityPhasicitySpontaneityPropertiesThrombus Aging +----+---------------+---------+-----------+----------+--------------+ CFV Full           Yes      Yes                                 +----+---------------+---------+-----------+----------+--------------+    Summary: RIGHT: - There is no evidence of deep vein thrombosis in the lower extremity.  - No cystic structure found in the popliteal fossa.  -  INCIDENTAL: Occlusion of the proximal to mid SFA. Dampened monophasic flow noted in the distal SFA, Pop A, and PTA.  LEFT: - No evidence of common femoral vein obstruction.   *See table(s) above for measurements and observations. Electronically signed by Lemar Livings MD on 03/13/2023 at 4:09:18 PM.    Final    CT ANGIO AO+BIFEM W & OR WO CONTRAST  Result Date: 03/13/2023 CLINICAL DATA:  Chest pain, iliac artery dissection EXAM: CT ANGIOGRAPHY OF CHEST CT ANGIOGRAPHY OF ABDOMINAL AORTA WITH ILIOFEMORAL RUNOFF TECHNIQUE: Multidetector CT imaging of the chest abdomen, pelvis and lower extremities was performed using the standard protocol during bolus administration of intravenous contrast. Multiplanar CT image reconstructions and MIPs were obtained to evaluate the vascular anatomy. RADIATION DOSE REDUCTION: This exam was performed according to the departmental dose-optimization program which includes automated exposure control, adjustment of the mA and/or kV according to patient size and/or use of iterative reconstruction technique. CONTRAST:  OMNIPAQUE IOHEXOL 350 MG/ML SOLN COMPARISON:  None Available. FINDINGS: CTA CHEST Cardiovascular: Conventional 3 vessel arch anatomy. Elongation of the isthmus and descending thoracic aorta consistent with type 3 arch. Extensive atherosclerotic plaque. The aortic root is normal in caliber at 3 cm measured at the sinuses of Valsalva. The ascending thoracic aorta is normal in caliber at 3.2 cm. The transverse thoracic aorta is mildly aneurysmal at 3.2 cm. Flow artifact present throughout the descending thoracic aorta consistent with poor cardiac output. The main pulmonary artery is normal in caliber. No evidence of pulmonary embolus. Cardiomegaly with significant biatrial enlargement. Incomplete opacification of the left atrial appendage raising concern for left atrial appendage thrombus. Mediastinum: Normal thyroid gland. Nonspecific 1.3 cm soft tissue nodule in the  anterior mediastinum (image 61 series 7). Pericardial fluid is present in the superior pericardial recesses. Lungs/Pleura: Interlobular septal thickening consistent with mild interstitial edema. Diffuse bronchial wall thickening. Elevation of the left hemidiaphragm. Small bilateral pleural effusions with associated bibasilar atelectasis. Musculoskeletal: No acute fracture or aggressive appearing lytic or blastic osseous lesion. CTA ABD/PELVIS WITH RUNOFF VASCULAR Aorta: Extensive irregular and ulcerated atherosclerotic plaque throughout the abdominal aorta. No evidence of aneurysm or dissection. Celiac: Patent without evidence of aneurysm, dissection, vasculitis or significant stenosis. SMA: Calcified plaque results in at least  moderate stenosis of the proximal SMA. Conventional hepatic arterial anatomy. Renals: Single renal arteries bilaterally. Calcified plaque results in moderate stenosis of the proximal right renal artery. Calcified plaque results in mild stenosis of the left renal artery. IMA: Patent without evidence of aneurysm, dissection, vasculitis or significant stenosis. RIGHT Lower Extremity Inflow: Calcified plaque throughout the right common iliac artery. 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 The external iliac artery is spared from disease. Outflow: The common femoral artery is widely patent. Profunda femoral branches are widely patent. Complete occlusion of the superficial femoral artery beginning in the upper thigh several cm beyond the vessel origin. The vessel then reconstitutes via collateral flow in the distal thigh. Multifocal stenoses of moderate to high-grade throughout Hunter's canal. Diffusely diseased but patent popliteal artery without significant stenosis. Runoff: High origin of the posterior tibial artery arising from the proximal P3 segment of the popliteal artery. Long tibioperoneal trunk which then bifurcates into the anterior tibial and peroneal arteries. Patent 3  vessel runoff to the ankle. LEFT Lower Extremity Inflow: Calcified plaque throughout the iliac system. No aneurysm, dissection stenosis or occlusion. Outflow: The common femoral and profunda femoral arteries are widely patent. The superficial femoral artery demonstrates scattered mild stenoses in the upper and mid thigh. There is a more moderate focal stenosis within Hunter's canal. The popliteal artery is patent in the P1 and P2 segments but then occludes in the P3 segment and remains occluded throughout the trifurcation. Runoff: Popliteal distal reconstitution of the anterior tibial and possibly posterior tibial arteries. Veins: No focal venous abnormality. Review of the MIP images confirms the above findings. NON-VASCULAR Hepatobiliary: Diffuse low attenuation of the hepatic parenchyma consistent with hepatic steatosis. Multiple circumscribed water attenuation lesions scattered throughout the liver consistent with cysts. No definite enhancing lesion. Gallbladder is unremarkable. No intra or extrahepatic biliary ductal dilatation. Pancreas: Unremarkable. No pancreatic ductal dilatation or surrounding inflammatory changes. Partially involved in the upward migration into the thorax due to the presence of the hiatal hernia. Spleen: Normal in size without focal abnormality. Adrenals/Urinary Tract: Normal adrenal glands. No evidence of hydronephrosis, nephrolithiasis or enhancing renal mass. Small circumscribed water attenuation lesions bilaterally most consistent with simple cysts. No imaging follow-up recommended. Ureters and bladder are unremarkable. Stomach/Bowel: Colonic diverticular disease without CT evidence of active inflammation. No focal bowel wall thickening or evidence of obstruction. Unremarkable appendix. Lymphatic: No suspicious lymphadenopathy. Reproductive: Uterus and bilateral adnexa are unremarkable. Other: No abdominal wall hernia or abnormality. No abdominopelvic ascites. Musculoskeletal: No acute  fracture or aggressive appearing lytic or blastic osseous lesion. Multilevel degenerative disc disease and bilateral facet arthropathy. IMPRESSION: CTA CHEST 1. Marked cardiomegaly with biatrial enlargement, small bilateral pleural effusions and interlobular septal thickening consistent with pulmonary edema. Overall findings suggest CHF. 2. Incomplete opacification of the left atrial appendage raises concern for thrombus within the left atrial appendage. Echocardiography could further evaluate if clinically warranted. 3. No evidence of aortic dissection or aneurysm. 4. No evidence of acute pulmonary embolism. 5. Nonspecific 1.3 cm soft tissue nodule in the anterior mediastinum. Differential considerations include thymoma, metastatic adenopathy, and less likely ectopic thyroid tissue or lymphoma. 6. Large hiatal hernia with chronic elevation of the left hemidiaphragm and upward migration of the stomach and tail of the pancreas into the chest. CTA ABD/PELVIS WITH RUNOFF 1. 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 Differential considerations include embolic phenomenon (note possible left atrial appendage thrombus described above) versus thrombosis  from underlying atherosclerotic plaque. 2. Occlusion right superficial femoral artery beginning several cm beyond the origin in extending into the distal thigh. Imaging appearance favors chronic atherosclerotic occlusive disease rather than embolization. 3. High origin of the right posterior tibial artery noted incidentally. 4. Extensive irregular and ulcerated atherosclerotic plaque throughout the entire abdominal aorta. 5. Moderate stenosis of the proximal SMA. 6. Moderate stenosis of the origin of the right renal artery. 7. Mild stenosis of the proximal left renal artery. 8. 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 9. Hepatic steatosis. 10. Colonic diverticular disease without CT  evidence of active inflammation. 11. Additional ancillary findings as above. Electronically Signed   By: Malachy Moan M.D.   On: 03/13/2023 14:13   CT ANGIO CHEST AORTA W/CM &/OR WO/CM  Result Date: 03/13/2023 CLINICAL DATA:  Chest pain, iliac artery dissection EXAM: CT ANGIOGRAPHY OF CHEST CT ANGIOGRAPHY OF ABDOMINAL AORTA WITH ILIOFEMORAL RUNOFF TECHNIQUE: Multidetector CT imaging of the chest abdomen, pelvis and lower extremities was performed using the standard protocol during bolus administration of intravenous contrast. Multiplanar CT image reconstructions and MIPs were obtained to evaluate the vascular anatomy. RADIATION DOSE REDUCTION: This exam was performed according to the departmental dose-optimization program which includes automated exposure control, adjustment of the mA and/or kV according to patient size and/or use of iterative reconstruction technique. CONTRAST:  OMNIPAQUE IOHEXOL 350 MG/ML SOLN COMPARISON:  None Available. FINDINGS: CTA CHEST Cardiovascular: Conventional 3 vessel arch anatomy. Elongation of the isthmus and descending thoracic aorta consistent with type 3 arch. Extensive atherosclerotic plaque. The aortic root is normal in caliber at 3 cm measured at the sinuses of Valsalva. The ascending thoracic aorta is normal in caliber at 3.2 cm. The transverse thoracic aorta is mildly aneurysmal at 3.2 cm. Flow artifact present throughout the descending thoracic aorta consistent with poor cardiac output. The main pulmonary artery is normal in caliber. No evidence of pulmonary embolus. Cardiomegaly with significant biatrial enlargement. Incomplete opacification of the left atrial appendage raising concern for left atrial appendage thrombus. Mediastinum: Normal thyroid gland. Nonspecific 1.3 cm soft tissue nodule in the anterior mediastinum (image 61 series 7). Pericardial fluid is present in the superior pericardial recesses. Lungs/Pleura: Interlobular septal thickening  consistent with mild interstitial edema. Diffuse bronchial wall thickening. Elevation of the left hemidiaphragm. Small bilateral pleural effusions with associated bibasilar atelectasis. Musculoskeletal: No acute fracture or aggressive appearing lytic or blastic osseous lesion. CTA ABD/PELVIS WITH RUNOFF VASCULAR Aorta: Extensive irregular and ulcerated atherosclerotic plaque throughout the abdominal aorta. No evidence of aneurysm or dissection. Celiac: Patent without evidence of aneurysm, dissection, vasculitis or significant stenosis. SMA: Calcified plaque results in at least moderate stenosis of the proximal SMA. Conventional hepatic arterial anatomy. Renals: Single renal arteries bilaterally. Calcified plaque results in moderate stenosis of the proximal right renal artery. Calcified plaque results in mild stenosis of the left renal artery. IMA: Patent without evidence of aneurysm, dissection, vasculitis or significant stenosis. RIGHT Lower Extremity Inflow: Calcified plaque throughout the right common iliac artery. 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 The external iliac artery is spared from disease. Outflow: The common femoral artery is widely patent. Profunda femoral branches are widely patent. Complete occlusion of the superficial femoral artery beginning in the upper thigh several cm beyond the vessel origin. The vessel then reconstitutes via collateral flow in the distal thigh. Multifocal stenoses of moderate to high-grade throughout Hunter's canal. Diffusely diseased but patent popliteal artery without significant stenosis. Runoff: High origin of the posterior tibial  artery arising from the proximal P3 segment of the popliteal artery. Long tibioperoneal trunk which then bifurcates into the anterior tibial and peroneal arteries. Patent 3 vessel runoff to the ankle. LEFT Lower Extremity Inflow: Calcified plaque throughout the iliac system. No aneurysm, dissection stenosis or occlusion.  Outflow: The common femoral and profunda femoral arteries are widely patent. The superficial femoral artery demonstrates scattered mild stenoses in the upper and mid thigh. There is a more moderate focal stenosis within Hunter's canal. The popliteal artery is patent in the P1 and P2 segments but then occludes in the P3 segment and remains occluded throughout the trifurcation. Runoff: Popliteal distal reconstitution of the anterior tibial and possibly posterior tibial arteries. Veins: No focal venous abnormality. Review of the MIP images confirms the above findings. NON-VASCULAR Hepatobiliary: Diffuse low attenuation of the hepatic parenchyma consistent with hepatic steatosis. Multiple circumscribed water attenuation lesions scattered throughout the liver consistent with cysts. No definite enhancing lesion. Gallbladder is unremarkable. No intra or extrahepatic biliary ductal dilatation. Pancreas: Unremarkable. No pancreatic ductal dilatation or surrounding inflammatory changes. Partially involved in the upward migration into the thorax due to the presence of the hiatal hernia. Spleen: Normal in size without focal abnormality. Adrenals/Urinary Tract: Normal adrenal glands. No evidence of hydronephrosis, nephrolithiasis or enhancing renal mass. Small circumscribed water attenuation lesions bilaterally most consistent with simple cysts. No imaging follow-up recommended. Ureters and bladder are unremarkable. Stomach/Bowel: Colonic diverticular disease without CT evidence of active inflammation. No focal bowel wall thickening or evidence of obstruction. Unremarkable appendix. Lymphatic: No suspicious lymphadenopathy. Reproductive: Uterus and bilateral adnexa are unremarkable. Other: No abdominal wall hernia or abnormality. No abdominopelvic ascites. Musculoskeletal: No acute fracture or aggressive appearing lytic or blastic osseous lesion. Multilevel degenerative disc disease and bilateral facet arthropathy. IMPRESSION:  CTA CHEST 1. Marked cardiomegaly with biatrial enlargement, small bilateral pleural effusions and interlobular septal thickening consistent with pulmonary edema. Overall findings suggest CHF. 2. Incomplete opacification of the left atrial appendage raises concern for thrombus within the left atrial appendage. Echocardiography could further evaluate if clinically warranted. 3. No evidence of aortic dissection or aneurysm. 4. No evidence of acute pulmonary embolism. 5. Nonspecific 1.3 cm soft tissue nodule in the anterior mediastinum. Differential considerations include thymoma, metastatic adenopathy, and less likely ectopic thyroid tissue or lymphoma. 6. Large hiatal hernia with chronic elevation of the left hemidiaphragm and upward migration of the stomach and tail of the pancreas into the chest. CTA ABD/PELVIS WITH RUNOFF 1. 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 Differential considerations include embolic phenomenon (note possible left atrial appendage thrombus described above) versus thrombosis from underlying atherosclerotic plaque. 2. Occlusion right superficial femoral artery beginning several cm beyond the origin in extending into the distal thigh. Imaging appearance favors chronic atherosclerotic occlusive disease rather than embolization. 3. High origin of the right posterior tibial artery noted incidentally. 4. Extensive irregular and ulcerated atherosclerotic plaque throughout the entire abdominal aorta. 5. Moderate stenosis of the proximal SMA. 6. Moderate stenosis of the origin of the right renal artery. 7. Mild stenosis of the proximal left renal artery. 8. 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 9. Hepatic steatosis. 10. Colonic diverticular disease without CT evidence of active inflammation. 11. Additional ancillary findings as above. Electronically Signed   By: Malachy Moan M.D.   On: 03/13/2023 14:13    DG Chest Portable 1 View  Result Date: 03/13/2023 CLINICAL DATA:  chest pain EXAM: PORTABLE CHEST -  1 VIEW COMPARISON:  03/08/2019 FINDINGS: Bilateral interstitial edema or infiltrates, increased since previous. Chronic elevation of left diaphragmatic leaflet. Heart size upper limits normal. Aortic Atherosclerosis (ICD10-170.0). No effusion. Visualized bones unremarkable. IMPRESSION: Bilateral interstitial edema or infiltrates. Electronically Signed   By: Corlis Leak M.D.   On: 03/13/2023 12:02     Subjective: Patient seen examined bedside, lying in bed.  No specific complaints.  Happy that she is discharging to rehab today.  Appreciative all the care she received in the hospital.  No other specific questions or concerns at this time.  Denies headache, no dizziness, no chest pain, no palpitations, no shortness of breath, no abdominal pain, no fever/chills/night sweats, no nausea/vomiting/diarrhea, no focal weakness, no fatigue, no paresthesia.  No acute events overnight per nursing staff.  Discharge Exam: Vitals:   03/30/23 0405 03/30/23 0807  BP: 119/73 (!) 136/90  Pulse: (!) 102 95  Resp: 15 20  Temp: 98.7 F (37.1 C) (!) 97.4 F (36.3 C)  SpO2: 94% 94%   Vitals:   03/29/23 2339 03/30/23 0405 03/30/23 0531 03/30/23 0807  BP: 124/74 119/73  (!) 136/90  Pulse: 85 (!) 102  95  Resp: 13 15  20   Temp: 98 F (36.7 C) 98.7 F (37.1 C)  (!) 97.4 F (36.3 C)  TempSrc: Oral Oral  Oral  SpO2: 92% 94%  94%  Weight:   73.7 kg   Height:        Physical Exam: GEN: NAD, alert and oriented x 3, elderly in appearance HEENT: NCAT, PERRL, EOMI, sclera clear, MMM PULM: CTAB w/o wheezes/crackles, normal respiratory effort, on room air CV: Irregularly irregular rhythm, normal rate w/o M/G/R GI: abd soft, NTND, NABS, no R/G/M MSK: no peripheral edema, muscle strength globally intact 5/5 bilateral upper/lower extremities NEURO: CN II-XII intact, no focal deficits, sensation to light touch  intact PSYCH: normal mood/affect Integumentary: dry/intact, no rashes or wounds    The results of significant diagnostics from this hospitalization (including imaging, microbiology, ancillary and laboratory) are listed below for reference.     Microbiology: Recent Results (from the past 240 hour(s))  Culture, blood (Routine X 2) w Reflex to ID Panel     Status: None   Collection Time: 03/24/23 11:06 AM   Specimen: BLOOD LEFT HAND  Result Value Ref Range Status   Specimen Description BLOOD LEFT HAND  Final   Special Requests   Final    BOTTLES DRAWN AEROBIC ONLY Blood Culture adequate volume   Culture   Final    NO GROWTH 5 DAYS Performed at [facility], [address] [place], [place] [address]    Report Status 03/29/2023 FINAL  Final  Culture, blood (Routine X 2) w Reflex to ID Panel     Status: None   Collection Time: 03/24/23  1:26 PM   Specimen: BLOOD LEFT ARM  Result Value Ref Range Status   Specimen Description BLOOD LEFT ARM  Final   Special Requests   Final    BOTTLES DRAWN AEROBIC ONLY Blood Culture results may not be optimal due to an inadequate volume of blood received in culture bottles   Culture   Final    NO GROWTH 5 DAYS Performed at [facility], [address] [place], [place] [address]    Report Status 03/29/2023 FINAL  Final     Labs: BNP (last 3 results) Recent Labs    03/24/23 1035 03/29/23 0900 03/30/23 0845  BNP 880.7* 1,262.6* 564.2*  Basic Metabolic Panel: Recent Labs  Lab 03/24/23 1035 03/25/23 0717 03/26/23 0333 03/27/23 0710 03/30/23 0845  NA 139 135 137 134* 132*  K 4.1 4.2 3.0* 3.6 2.9*  CL 96* 96* 99 99 96*  CO2 25 27 29 27 26   GLUCOSE 108* 103* 114* 110* 138*  BUN 26* 18 18 16 18   CREATININE 0.94 0.75 0.71 0.74 0.91  CALCIUM 9.0 8.2* 8.5* 8.5* 8.2*  MG  --   --   --  2.0 1.7  PHOS  --   --   --   --  1.5*   Liver Function Tests: No results for input(s): "AST", "ALT", "ALKPHOS", "BILITOT", "PROT",  "ALBUMIN" in the last 168 hours. No results for input(s): "LIPASE", "AMYLASE" in the last 168 hours. No results for input(s): "AMMONIA" in the last 168 hours. CBC: Recent Labs  Lab 03/26/23 0333 03/27/23 0710 03/28/23 0600 03/28/23 0757 03/29/23 0750 03/30/23 0845  WBC 14.7* 12.5* 9.9  --  10.6* 10.7*  NEUTROABS 12.4*  --   --   --   --   --   HGB 10.7* 11.3* 8.2* 10.5* 11.7* 10.7*  HCT 32.8* 35.0* 25.5* 32.7* 36.0 32.6*  MCV 99.1 98.6 98.8  --  95.2 93.9  PLT 275 285 230  --  298 293   Cardiac Enzymes: No results for input(s): "CKTOTAL", "CKMB", "CKMBINDEX", "TROPONINI" in the last 168 hours. BNP: Invalid input(s): "POCBNP" CBG: Recent Labs  Lab 03/29/23 2119  GLUCAP 163*   D-Dimer No results for input(s): "DDIMER" in the last 72 hours. Hgb A1c No results for input(s): "HGBA1C" in the last 72 hours. Lipid Profile No results for input(s): "CHOL", "HDL", "LDLCALC", "TRIG", "CHOLHDL", "LDLDIRECT" in the last 72 hours. Thyroid function studies No results for input(s): "TSH", "T4TOTAL", "T3FREE", "THYROIDAB" in the last 72 hours.  Invalid input(s): "FREET3" Anemia work up No results for input(s): "VITAMINB12", "FOLATE", "FERRITIN", "TIBC", "IRON", "RETICCTPCT" in the last 72 hours. Urinalysis    Component Value Date/Time   COLORURINE STRAW (A) 03/25/2023 0225   APPEARANCEUR CLEAR 03/25/2023 0225   LABSPEC 1.009 03/25/2023 0225   PHURINE 5.0 03/25/2023 0225   GLUCOSEU NEGATIVE 03/25/2023 0225   HGBUR SMALL (A) 03/25/2023 0225   BILIRUBINUR NEGATIVE 03/25/2023 0225   KETONESUR NEGATIVE 03/25/2023 0225   PROTEINUR NEGATIVE 03/25/2023 0225   NITRITE NEGATIVE 03/25/2023 0225   LEUKOCYTESUR NEGATIVE 03/25/2023 0225   Sepsis Labs Recent Labs  Lab 03/27/23 0710 03/28/23 0600 03/29/23 0750 03/30/23 0845  WBC 12.5* 9.9 10.6* 10.7*   Microbiology Recent Results (from the past 240 hour(s))  Culture, blood (Routine X 2) w Reflex to ID Panel     Status: None    Collection Time: 03/24/23 11:06 AM   Specimen: BLOOD LEFT HAND  Result Value Ref Range Status   Specimen Description BLOOD LEFT HAND  Final   Special Requests   Final    BOTTLES DRAWN AEROBIC ONLY Blood Culture adequate volume   Culture   Final    NO GROWTH 5 DAYS Performed at [facility], [address] [place], [place] [address]    Report Status 03/29/2023 FINAL  Final  Culture, blood (Routine X 2) w Reflex to ID Panel     Status: None   Collection Time: 03/24/23  1:26 PM   Specimen: BLOOD LEFT ARM  Result Value Ref Range Status   Specimen Description BLOOD LEFT ARM  Final   Special Requests   Final    BOTTLES DRAWN AEROBIC  ONLY Blood Culture results may not be optimal due to an inadequate volume of blood received in culture bottles   Culture   Final    NO GROWTH 5 DAYS Performed at [facility], [address] [place], [place] [address]    Report Status 03/29/2023 FINAL  Final     Time coordinating discharge: Over 30 minutes  SIGNED:   Alvira Philips [place], DO  [facility] 03/30/2023, 12:11 PM

## 2023-03-30 NOTE — Progress Notes (Signed)
Attempted to call report and given another number to call. [phone]. Pt to go to [facility]?). Will give report to PM staff to call report once pt ready. Pt resting with call bell within reach.  Will continue to monitor. Discharge summary in notes for report. Thomas Hoff, RN

## 2023-03-30 NOTE — Progress Notes (Signed)
Mobility Specialist Progress Note:   03/30/23 1018  Mobility  Activity Ambulated with assistance in hallway  Level of Assistance Contact guard assist, steadying assist  Assistive Device Front wheel walker  Distance Ambulated (ft) 200 ft  Activity Response Tolerated well  Mobility Referral Yes  $Mobility charge 1 Mobility  Mobility Specialist Start Time (ACUTE ONLY) 0957  Mobility Specialist Stop Time (ACUTE ONLY) 1007  Mobility Specialist Time Calculation (min) (ACUTE ONLY) 10 min   During Mobility: 113 HR  Post Mobility: 104 HR  Pt received in bed, agreeable to mobility. C/o slight RLE discomfort, otherwise asymptomatic throughout. Pt returned to bed with call bell in reach and all needs met.  Leory Plowman  Mobility Specialist Please contact via [employer] office at [phone]

## 2023-03-30 NOTE — TOC Transition Note (Signed)
Transition of Care [facility]) - CM/SW Discharge Note   Patient Details  Name: Sarah Wolf MRN: [id number] Date of Birth: 1934-09-02  Transition of [facility] CM/SW Contact:  Eduard Roux, LCSW Phone Number: 03/30/2023, 3:22 PM   Clinical Narrative:     Patient will Discharge WU:[id number]  Discharge Date:03/30/2023 Family Notified: niece/family Transport NW:[facility] @ 8pm  call [facility] [phone], if any problems.  Per MD patient is ready for discharge. RN, patient, and facility notified of discharge. Discharge Summary sent to facility. RN given number for [phone], [facility] room 203. Ambulance transport requested for patient.   Clinical Social Worker signing off.  Antony Blackbird, MSW, LCSW Clinical Social Worker     Final next level of care: Skilled Nursing Facility Barriers to Discharge: Barriers Resolved   Patient Goals and CMS Choice   Choice offered to / list presented to : Patient  Discharge Placement                [name] Patient to be transferred to facility by: [facility] Name of family member notified: niece Patient and family notified of of transfer: 03/30/23  Discharge Plan and Services Additional resources added to the After Visit Summary for   In-house Referral: Clinical Social Work Discharge Planning Services: CM Consult              DME Agency: NA                  Social Determinants of Health (SDOH) Interventions SDOH Screenings   Food Insecurity: No Food Insecurity (03/13/2023)  Housing: Medium Risk (03/13/2023)  Transportation Needs: No Transportation Needs (03/13/2023)  Utilities: Not At Risk (03/13/2023)  Tobacco Use: Low Risk  (03/26/2023)     Readmission Risk Interventions     No data to display

## 2023-03-30 NOTE — Progress Notes (Signed)
Picc line removed intact at 41 cm.  Vaseline guaze and pressure dsg applied.  Nurse and pt. Instructed to keep in bed flat for 30 minutes. No bleeding noted.

## 2023-03-31 DIAGNOSIS — Z7401 Bed confinement status: Secondary | ICD-10-CM | POA: Diagnosis not present

## 2023-03-31 DIAGNOSIS — Q828 Other specified congenital malformations of skin: Secondary | ICD-10-CM | POA: Diagnosis not present

## 2023-03-31 DIAGNOSIS — K922 Gastrointestinal hemorrhage, unspecified: Secondary | ICD-10-CM | POA: Diagnosis not present

## 2023-03-31 DIAGNOSIS — M79604 Pain in right leg: Secondary | ICD-10-CM | POA: Diagnosis not present

## 2023-03-31 DIAGNOSIS — I5023 Acute on chronic systolic (congestive) heart failure: Secondary | ICD-10-CM | POA: Diagnosis not present

## 2023-03-31 DIAGNOSIS — I739 Peripheral vascular disease, unspecified: Secondary | ICD-10-CM | POA: Diagnosis not present

## 2023-03-31 DIAGNOSIS — K2210 Ulcer of esophagus without bleeding: Secondary | ICD-10-CM | POA: Diagnosis not present

## 2023-03-31 DIAGNOSIS — Z48812 Encounter for surgical aftercare following surgery on the circulatory system: Secondary | ICD-10-CM | POA: Diagnosis not present

## 2023-03-31 DIAGNOSIS — I499 Cardiac arrhythmia, unspecified: Secondary | ICD-10-CM | POA: Diagnosis not present

## 2023-03-31 DIAGNOSIS — I70223 Atherosclerosis of native arteries of extremities with rest pain, bilateral legs: Secondary | ICD-10-CM | POA: Diagnosis not present

## 2023-03-31 DIAGNOSIS — I513 Intracardiac thrombosis, not elsewhere classified: Secondary | ICD-10-CM | POA: Diagnosis not present

## 2023-03-31 DIAGNOSIS — J9600 Acute respiratory failure, unspecified whether with hypoxia or hypercapnia: Secondary | ICD-10-CM | POA: Diagnosis not present

## 2023-03-31 DIAGNOSIS — D649 Anemia, unspecified: Secondary | ICD-10-CM | POA: Diagnosis not present

## 2023-03-31 DIAGNOSIS — T17908D Unspecified foreign body in respiratory tract, part unspecified causing other injury, subsequent encounter: Secondary | ICD-10-CM | POA: Diagnosis not present

## 2023-03-31 DIAGNOSIS — M79675 Pain in left toe(s): Secondary | ICD-10-CM | POA: Diagnosis not present

## 2023-03-31 DIAGNOSIS — M79674 Pain in right toe(s): Secondary | ICD-10-CM | POA: Diagnosis not present

## 2023-03-31 DIAGNOSIS — I4891 Unspecified atrial fibrillation: Secondary | ICD-10-CM | POA: Diagnosis not present

## 2023-03-31 DIAGNOSIS — B351 Tinea unguium: Secondary | ICD-10-CM | POA: Diagnosis not present

## 2023-03-31 DIAGNOSIS — J9601 Acute respiratory failure with hypoxia: Secondary | ICD-10-CM | POA: Diagnosis not present

## 2023-03-31 DIAGNOSIS — I70221 Atherosclerosis of native arteries of extremities with rest pain, right leg: Secondary | ICD-10-CM | POA: Diagnosis not present

## 2023-03-31 DIAGNOSIS — J189 Pneumonia, unspecified organism: Secondary | ICD-10-CM | POA: Diagnosis not present

## 2023-03-31 DIAGNOSIS — I5A Non-ischemic myocardial injury (non-traumatic): Secondary | ICD-10-CM | POA: Diagnosis not present

## 2023-03-31 DIAGNOSIS — K920 Hematemesis: Secondary | ICD-10-CM | POA: Diagnosis not present

## 2023-03-31 NOTE — Progress Notes (Signed)
RN called report to Counselling psychologist at [facility].   [facility] has arrived to pick the patient up.

## 2023-04-04 DIAGNOSIS — I70223 Atherosclerosis of native arteries of extremities with rest pain, bilateral legs: Secondary | ICD-10-CM | POA: Diagnosis not present

## 2023-04-04 DIAGNOSIS — J189 Pneumonia, unspecified organism: Secondary | ICD-10-CM | POA: Diagnosis not present

## 2023-04-04 DIAGNOSIS — K2210 Ulcer of esophagus without bleeding: Secondary | ICD-10-CM | POA: Diagnosis not present

## 2023-04-04 DIAGNOSIS — K922 Gastrointestinal hemorrhage, unspecified: Secondary | ICD-10-CM | POA: Diagnosis not present

## 2023-04-10 ENCOUNTER — Encounter: Payer: Self-pay | Admitting: Podiatry

## 2023-04-10 ENCOUNTER — Ambulatory Visit: Payer: Medicare Other | Admitting: Podiatry

## 2023-04-10 DIAGNOSIS — B351 Tinea unguium: Secondary | ICD-10-CM

## 2023-04-10 DIAGNOSIS — M79675 Pain in left toe(s): Secondary | ICD-10-CM | POA: Diagnosis not present

## 2023-04-10 DIAGNOSIS — M79674 Pain in right toe(s): Secondary | ICD-10-CM | POA: Diagnosis not present

## 2023-04-10 DIAGNOSIS — Q828 Other specified congenital malformations of skin: Secondary | ICD-10-CM | POA: Insufficient documentation

## 2023-04-10 NOTE — Progress Notes (Signed)
This patient presents to the office with chief complaint of long thick painful nails.  Patient says the nails are painful walking and wearing shoes.  This patient is unable to self treat.  This patient is unable to trim her nails since she is unable to reach her nails.  She presents to the office with her nephew. She presents to the office for preventative foot care services.  General Appearance  Alert, conversant and in no acute stress.  Vascular  Dorsalis pedis and posterior tibial  pulses are  weakly palpable  bilaterally.  Capillary return is within normal limits  bilaterally. Temperature is within normal limits  bilaterally.  Neurologic  Senn-Weinstein monofilament wire test within normal limits  bilaterally. Muscle power within normal limits bilaterally.  Nails Thick disfigured discolored nails with subungual debris  from hallux to fifth toes bilaterally. No evidence of bacterial infection or drainage bilaterally.  Orthopedic  No limitations of motion  feet .  No crepitus or effusions noted.  No bony pathology or digital deformities noted.  Skin  normotropic skin with no porokeratosis noted bilaterally.  No signs of infections or ulcers noted. Porokeratosis sub 4 left foot.     Onychomycosis  Nails  B/L.  Pain in right toes  Pain in left toes  Porokeratosis left foot.  Debridement of nails both feet followed trimming the nails with dremel tool.  Debride porokeratosis with # 15 blade and dremel tool.   RTC 3 months.   Helane Gunther DPM

## 2023-04-13 DIAGNOSIS — I70223 Atherosclerosis of native arteries of extremities with rest pain, bilateral legs: Secondary | ICD-10-CM | POA: Diagnosis not present

## 2023-04-13 DIAGNOSIS — K2210 Ulcer of esophagus without bleeding: Secondary | ICD-10-CM | POA: Diagnosis not present

## 2023-04-13 DIAGNOSIS — J189 Pneumonia, unspecified organism: Secondary | ICD-10-CM | POA: Diagnosis not present

## 2023-04-13 DIAGNOSIS — I5023 Acute on chronic systolic (congestive) heart failure: Secondary | ICD-10-CM | POA: Diagnosis not present

## 2023-04-17 DIAGNOSIS — I82411 Acute embolism and thrombosis of right femoral vein: Secondary | ICD-10-CM | POA: Diagnosis not present

## 2023-04-17 DIAGNOSIS — I872 Venous insufficiency (chronic) (peripheral): Secondary | ICD-10-CM | POA: Diagnosis not present

## 2023-04-17 DIAGNOSIS — I5032 Chronic diastolic (congestive) heart failure: Secondary | ICD-10-CM | POA: Diagnosis not present

## 2023-04-17 DIAGNOSIS — E1151 Type 2 diabetes mellitus with diabetic peripheral angiopathy without gangrene: Secondary | ICD-10-CM | POA: Diagnosis not present

## 2023-04-17 DIAGNOSIS — S90822D Blister (nonthermal), left foot, subsequent encounter: Secondary | ICD-10-CM | POA: Diagnosis not present

## 2023-04-17 DIAGNOSIS — I701 Atherosclerosis of renal artery: Secondary | ICD-10-CM | POA: Diagnosis not present

## 2023-04-17 DIAGNOSIS — K551 Chronic vascular disorders of intestine: Secondary | ICD-10-CM | POA: Diagnosis not present

## 2023-04-17 DIAGNOSIS — L97811 Non-pressure chronic ulcer of other part of right lower leg limited to breakdown of skin: Secondary | ICD-10-CM | POA: Diagnosis not present

## 2023-04-17 DIAGNOSIS — I82432 Acute embolism and thrombosis of left popliteal vein: Secondary | ICD-10-CM | POA: Diagnosis not present

## 2023-04-17 DIAGNOSIS — J9859 Other diseases of mediastinum, not elsewhere classified: Secondary | ICD-10-CM | POA: Diagnosis not present

## 2023-04-17 DIAGNOSIS — I5023 Acute on chronic systolic (congestive) heart failure: Secondary | ICD-10-CM | POA: Diagnosis not present

## 2023-04-17 DIAGNOSIS — I82521 Chronic embolism and thrombosis of right iliac vein: Secondary | ICD-10-CM | POA: Diagnosis not present

## 2023-04-17 DIAGNOSIS — L97821 Non-pressure chronic ulcer of other part of left lower leg limited to breakdown of skin: Secondary | ICD-10-CM | POA: Diagnosis not present

## 2023-04-17 DIAGNOSIS — I480 Paroxysmal atrial fibrillation: Secondary | ICD-10-CM | POA: Diagnosis not present

## 2023-04-17 DIAGNOSIS — I70223 Atherosclerosis of native arteries of extremities with rest pain, bilateral legs: Secondary | ICD-10-CM | POA: Diagnosis not present

## 2023-04-17 DIAGNOSIS — I110 Hypertensive heart disease with heart failure: Secondary | ICD-10-CM | POA: Diagnosis not present

## 2023-04-18 DIAGNOSIS — I110 Hypertensive heart disease with heart failure: Secondary | ICD-10-CM | POA: Diagnosis not present

## 2023-04-18 DIAGNOSIS — J9859 Other diseases of mediastinum, not elsewhere classified: Secondary | ICD-10-CM | POA: Diagnosis not present

## 2023-04-18 DIAGNOSIS — I5023 Acute on chronic systolic (congestive) heart failure: Secondary | ICD-10-CM | POA: Diagnosis not present

## 2023-04-18 DIAGNOSIS — I5032 Chronic diastolic (congestive) heart failure: Secondary | ICD-10-CM | POA: Diagnosis not present

## 2023-04-18 DIAGNOSIS — I82411 Acute embolism and thrombosis of right femoral vein: Secondary | ICD-10-CM | POA: Diagnosis not present

## 2023-04-18 DIAGNOSIS — I70223 Atherosclerosis of native arteries of extremities with rest pain, bilateral legs: Secondary | ICD-10-CM | POA: Diagnosis not present

## 2023-04-18 DIAGNOSIS — S90822D Blister (nonthermal), left foot, subsequent encounter: Secondary | ICD-10-CM | POA: Diagnosis not present

## 2023-04-18 DIAGNOSIS — I872 Venous insufficiency (chronic) (peripheral): Secondary | ICD-10-CM | POA: Diagnosis not present

## 2023-04-18 DIAGNOSIS — I82521 Chronic embolism and thrombosis of right iliac vein: Secondary | ICD-10-CM | POA: Diagnosis not present

## 2023-04-18 DIAGNOSIS — L97811 Non-pressure chronic ulcer of other part of right lower leg limited to breakdown of skin: Secondary | ICD-10-CM | POA: Diagnosis not present

## 2023-04-18 DIAGNOSIS — K551 Chronic vascular disorders of intestine: Secondary | ICD-10-CM | POA: Diagnosis not present

## 2023-04-18 DIAGNOSIS — I480 Paroxysmal atrial fibrillation: Secondary | ICD-10-CM | POA: Diagnosis not present

## 2023-04-18 DIAGNOSIS — I82432 Acute embolism and thrombosis of left popliteal vein: Secondary | ICD-10-CM | POA: Diagnosis not present

## 2023-04-18 DIAGNOSIS — I701 Atherosclerosis of renal artery: Secondary | ICD-10-CM | POA: Diagnosis not present

## 2023-04-18 DIAGNOSIS — L97821 Non-pressure chronic ulcer of other part of left lower leg limited to breakdown of skin: Secondary | ICD-10-CM | POA: Diagnosis not present

## 2023-04-18 DIAGNOSIS — E1151 Type 2 diabetes mellitus with diabetic peripheral angiopathy without gangrene: Secondary | ICD-10-CM | POA: Diagnosis not present

## 2023-04-19 ENCOUNTER — Other Ambulatory Visit: Payer: Self-pay | Admitting: *Deleted

## 2023-04-19 ENCOUNTER — Telehealth: Payer: Self-pay | Admitting: Internal Medicine

## 2023-04-19 DIAGNOSIS — I70221 Atherosclerosis of native arteries of extremities with rest pain, right leg: Secondary | ICD-10-CM

## 2023-04-19 NOTE — Telephone Encounter (Signed)
I spoke with LeLe with [facility] and she reports that the pt has bilateral 2+ pitting lower extremity edema... she has weeping ulcers.Marland Kitchen and she is having much discomfort.Marland KitchenMarland KitchenI advised LeLe that she may need to talk with her Vascular MD or her PCP... and the pt had an appt with [place] this Friday 04/21/23 but the appt was cancelled... she say she will talk to the pts nephew that handles her visits and be sure that he brigs her if I can reinstate the appt which I did with Tereso Newcomer PA..  I will forward this note to her PCP and to Dr Randie Heinz fro review and recommendations.   Dr Tenny Craw consulted on the pt in the hospital but the pt has not had follow up with [place] this far.

## 2023-04-19 NOTE — Telephone Encounter (Signed)
Left a message for [facility] to call back... may need to talk with vascular surgery since they are planning [place] and pt has not seen Dr Tenny Craw in the office.

## 2023-04-19 NOTE — Telephone Encounter (Signed)
Lele, HH nurse with [facility] called in stating pt has a couple open areas where pt is weeping, wanted to see if doctor will allow them to use unna boots to help with swelling and wounds. Please advise.

## 2023-04-19 NOTE — Telephone Encounter (Signed)
OV with Tereso Newcomer PA has been cancelled for 04/21/23 so not sure who cancelled and why.

## 2023-04-19 NOTE — Telephone Encounter (Signed)
[name], PT calling with [facility] as well, stating he needs verbal orders to continue with physical therapy

## 2023-04-20 DIAGNOSIS — I872 Venous insufficiency (chronic) (peripheral): Secondary | ICD-10-CM | POA: Diagnosis not present

## 2023-04-20 DIAGNOSIS — I5032 Chronic diastolic (congestive) heart failure: Secondary | ICD-10-CM | POA: Diagnosis not present

## 2023-04-20 DIAGNOSIS — I82521 Chronic embolism and thrombosis of right iliac vein: Secondary | ICD-10-CM | POA: Diagnosis not present

## 2023-04-20 DIAGNOSIS — I739 Peripheral vascular disease, unspecified: Secondary | ICD-10-CM | POA: Insufficient documentation

## 2023-04-20 DIAGNOSIS — I701 Atherosclerosis of renal artery: Secondary | ICD-10-CM | POA: Diagnosis not present

## 2023-04-20 DIAGNOSIS — I82432 Acute embolism and thrombosis of left popliteal vein: Secondary | ICD-10-CM | POA: Diagnosis not present

## 2023-04-20 DIAGNOSIS — I82411 Acute embolism and thrombosis of right femoral vein: Secondary | ICD-10-CM | POA: Diagnosis not present

## 2023-04-20 DIAGNOSIS — I70223 Atherosclerosis of native arteries of extremities with rest pain, bilateral legs: Secondary | ICD-10-CM | POA: Diagnosis not present

## 2023-04-20 DIAGNOSIS — E1151 Type 2 diabetes mellitus with diabetic peripheral angiopathy without gangrene: Secondary | ICD-10-CM | POA: Diagnosis not present

## 2023-04-20 DIAGNOSIS — S90822D Blister (nonthermal), left foot, subsequent encounter: Secondary | ICD-10-CM | POA: Diagnosis not present

## 2023-04-20 DIAGNOSIS — E7800 Pure hypercholesterolemia, unspecified: Secondary | ICD-10-CM | POA: Insufficient documentation

## 2023-04-20 DIAGNOSIS — L97811 Non-pressure chronic ulcer of other part of right lower leg limited to breakdown of skin: Secondary | ICD-10-CM | POA: Diagnosis not present

## 2023-04-20 DIAGNOSIS — J9859 Other diseases of mediastinum, not elsewhere classified: Secondary | ICD-10-CM | POA: Diagnosis not present

## 2023-04-20 DIAGNOSIS — I5023 Acute on chronic systolic (congestive) heart failure: Secondary | ICD-10-CM | POA: Diagnosis not present

## 2023-04-20 DIAGNOSIS — K551 Chronic vascular disorders of intestine: Secondary | ICD-10-CM | POA: Diagnosis not present

## 2023-04-20 DIAGNOSIS — I110 Hypertensive heart disease with heart failure: Secondary | ICD-10-CM | POA: Diagnosis not present

## 2023-04-20 DIAGNOSIS — L97821 Non-pressure chronic ulcer of other part of left lower leg limited to breakdown of skin: Secondary | ICD-10-CM | POA: Diagnosis not present

## 2023-04-20 DIAGNOSIS — I480 Paroxysmal atrial fibrillation: Secondary | ICD-10-CM | POA: Diagnosis not present

## 2023-04-20 NOTE — Progress Notes (Deleted)
Cardiology Office Note:    Date:  04/20/2023  ID:  Sarah Wolf, DOB Jan 16, 1935, MRN [id number] PCP: Ivonne Andrew, NP  [facility] Providers Cardiologist:  Dietrich Pates, MD { Click to update primary MD,subspecialty MD or APP then REFRESH:1}    {Click to Open Review  :1}   Patient Profile:      Paroxysmal atrial fibrillation  Admx 02/2023 w AF w RVR c/b a/c limb ischemia (?embolic), acute HFmrEF, UGI bleed, aspiration pneumonia HFmrEF (heart failure with mildly reduced ejection fraction)  TTE 03/09/19: EF 60-65 TTE 03/14/23: EF 40-45, small effusion, trivial MR (basal inf wall appeared HK) Peripheral arterial disease  A/c limb ischemia in setting of AF w RVR (?embolic) >> s/p thrombolysis and R SFA stent 02/2023 Arterial US 03/14/23: R SFA 100; L popliteal 100 CT 02/2023: R FA 100, L pop 100; R int iliac 100; SMA mod dz; R RA mod dz, L RA mild dz  Hx of DVT in 2020 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  Sarah Wolf is a 88 y.o. female who returns for post hospital follow up. She was admitted 8/26-9/12. She presented with R leg pain. She was noted to have an occluded R SFA and L pop and was noted to be in AF w RVR and acute CHF. Her hsT was mildly elevated and flat, not c/w ACS. She was placed on IV Amiodarone and IV Lasix. CT suggested LA clot and Amiodarone was DC'd. She was managed with rate control strategy. It was suspected that the L pop and R SFA occlusions were embolic. Echocardiogram was tech difficult and did not evaluate LA well. EF was down at 40-45 and there was basal inf wall HK. She underwent thrombolysis w R SFA stent. She developed aspiration pneumonia and  was intubated. She underwent EGD for coffee ground emesis. This showed esophagitis with ulceration w/o bleeding and Barrett's esophagus. She was started on PPI and allowed to resume anticoagulation. There was a mediastinal mass noted on CT. Plan is to follow up with CT surgery as an OP. Of note, Amio was resumed prior to DC.             ROS   See HPI ***    Studies Reviewed:       *** Risk Assessment/Calculations:   {Does this patient have ATRIAL FIBRILLATION?:[id number]} No BP recorded.  {Refresh Note OR Click here to enter BP  :1}***       Physical Exam:   VS:  There were no vitals taken for this visit.   Wt Readings from Last 3 Encounters:  03/30/23 162 lb 7.7 oz (73.7 kg)  03/08/19 140 lb (63.5 kg)    Physical Exam***     Assessment and Plan:   Assessment & Plan Persistent atrial fibrillation (HCC)  Heart failure with mildly reduced ejection fraction (HFmrEF, 41-49%) (HCC)  PAD (peripheral artery disease) (HCC)  History of GI bleed  Lesion of mediastinum   Assessment and Plan             {      :1}    {Are you ordering a CV Procedure (  e.g. stress test, cath, DCCV, TEE, etc)?   Press F2        :[id number]}  Dispo:  No follow-ups on file.  Signed, Tereso Newcomer, PA-C

## 2023-04-21 ENCOUNTER — Ambulatory Visit: Payer: Medicare Other | Attending: Physician Assistant | Admitting: Physician Assistant

## 2023-04-21 ENCOUNTER — Ambulatory Visit: Payer: Medicare Other | Admitting: Physician Assistant

## 2023-04-21 DIAGNOSIS — I82521 Chronic embolism and thrombosis of right iliac vein: Secondary | ICD-10-CM | POA: Diagnosis not present

## 2023-04-21 DIAGNOSIS — I872 Venous insufficiency (chronic) (peripheral): Secondary | ICD-10-CM | POA: Diagnosis not present

## 2023-04-21 DIAGNOSIS — E1151 Type 2 diabetes mellitus with diabetic peripheral angiopathy without gangrene: Secondary | ICD-10-CM | POA: Diagnosis not present

## 2023-04-21 DIAGNOSIS — I5023 Acute on chronic systolic (congestive) heart failure: Secondary | ICD-10-CM | POA: Diagnosis not present

## 2023-04-21 DIAGNOSIS — S90822D Blister (nonthermal), left foot, subsequent encounter: Secondary | ICD-10-CM | POA: Diagnosis not present

## 2023-04-21 DIAGNOSIS — L97821 Non-pressure chronic ulcer of other part of left lower leg limited to breakdown of skin: Secondary | ICD-10-CM | POA: Diagnosis not present

## 2023-04-21 DIAGNOSIS — I480 Paroxysmal atrial fibrillation: Secondary | ICD-10-CM | POA: Diagnosis not present

## 2023-04-21 DIAGNOSIS — I5020 Unspecified systolic (congestive) heart failure: Secondary | ICD-10-CM

## 2023-04-21 DIAGNOSIS — L97811 Non-pressure chronic ulcer of other part of right lower leg limited to breakdown of skin: Secondary | ICD-10-CM | POA: Diagnosis not present

## 2023-04-21 DIAGNOSIS — I110 Hypertensive heart disease with heart failure: Secondary | ICD-10-CM | POA: Diagnosis not present

## 2023-04-21 DIAGNOSIS — K551 Chronic vascular disorders of intestine: Secondary | ICD-10-CM | POA: Diagnosis not present

## 2023-04-21 DIAGNOSIS — E7800 Pure hypercholesterolemia, unspecified: Secondary | ICD-10-CM

## 2023-04-21 DIAGNOSIS — I82411 Acute embolism and thrombosis of right femoral vein: Secondary | ICD-10-CM | POA: Diagnosis not present

## 2023-04-21 DIAGNOSIS — I5032 Chronic diastolic (congestive) heart failure: Secondary | ICD-10-CM | POA: Diagnosis not present

## 2023-04-21 DIAGNOSIS — J9859 Other diseases of mediastinum, not elsewhere classified: Secondary | ICD-10-CM | POA: Diagnosis not present

## 2023-04-21 DIAGNOSIS — I82432 Acute embolism and thrombosis of left popliteal vein: Secondary | ICD-10-CM | POA: Diagnosis not present

## 2023-04-21 DIAGNOSIS — I70223 Atherosclerosis of native arteries of extremities with rest pain, bilateral legs: Secondary | ICD-10-CM | POA: Diagnosis not present

## 2023-04-21 DIAGNOSIS — I739 Peripheral vascular disease, unspecified: Secondary | ICD-10-CM

## 2023-04-21 DIAGNOSIS — Z8719 Personal history of other diseases of the digestive system: Secondary | ICD-10-CM

## 2023-04-21 DIAGNOSIS — I701 Atherosclerosis of renal artery: Secondary | ICD-10-CM | POA: Diagnosis not present

## 2023-04-21 DIAGNOSIS — I4819 Other persistent atrial fibrillation: Secondary | ICD-10-CM

## 2023-04-21 NOTE — Telephone Encounter (Signed)
Talk with Sarah Wolf with [facility], he is going to call patient's PCP.

## 2023-04-21 NOTE — Telephone Encounter (Signed)
LeLe from [facility] is requesting a callback from nurse Dewayne Hatch regarding orders and visits she stated they'd been discussing. She like to be called at [phone]. Please advise

## 2023-04-24 ENCOUNTER — Telehealth: Payer: Self-pay | Admitting: Nurse Practitioner

## 2023-04-24 ENCOUNTER — Inpatient Hospital Stay: Payer: Self-pay | Admitting: Nurse Practitioner

## 2023-04-24 NOTE — Telephone Encounter (Signed)
[facility]:: called and said that she is sick and her OXYGEN is low and refuses to go to hospital.  This pt most likely wont come to her appt.   [name] : [phone]

## 2023-04-25 DIAGNOSIS — J9859 Other diseases of mediastinum, not elsewhere classified: Secondary | ICD-10-CM | POA: Diagnosis not present

## 2023-04-25 DIAGNOSIS — I5023 Acute on chronic systolic (congestive) heart failure: Secondary | ICD-10-CM | POA: Diagnosis not present

## 2023-04-25 DIAGNOSIS — K551 Chronic vascular disorders of intestine: Secondary | ICD-10-CM | POA: Diagnosis not present

## 2023-04-25 DIAGNOSIS — I480 Paroxysmal atrial fibrillation: Secondary | ICD-10-CM | POA: Diagnosis not present

## 2023-04-25 DIAGNOSIS — I110 Hypertensive heart disease with heart failure: Secondary | ICD-10-CM | POA: Diagnosis not present

## 2023-04-25 DIAGNOSIS — L97821 Non-pressure chronic ulcer of other part of left lower leg limited to breakdown of skin: Secondary | ICD-10-CM | POA: Diagnosis not present

## 2023-04-25 DIAGNOSIS — L97811 Non-pressure chronic ulcer of other part of right lower leg limited to breakdown of skin: Secondary | ICD-10-CM | POA: Diagnosis not present

## 2023-04-25 DIAGNOSIS — I701 Atherosclerosis of renal artery: Secondary | ICD-10-CM | POA: Diagnosis not present

## 2023-04-25 DIAGNOSIS — S90822D Blister (nonthermal), left foot, subsequent encounter: Secondary | ICD-10-CM | POA: Diagnosis not present

## 2023-04-25 DIAGNOSIS — I82432 Acute embolism and thrombosis of left popliteal vein: Secondary | ICD-10-CM | POA: Diagnosis not present

## 2023-04-25 DIAGNOSIS — E1151 Type 2 diabetes mellitus with diabetic peripheral angiopathy without gangrene: Secondary | ICD-10-CM | POA: Diagnosis not present

## 2023-04-25 DIAGNOSIS — I82411 Acute embolism and thrombosis of right femoral vein: Secondary | ICD-10-CM | POA: Diagnosis not present

## 2023-04-25 DIAGNOSIS — I872 Venous insufficiency (chronic) (peripheral): Secondary | ICD-10-CM | POA: Diagnosis not present

## 2023-04-25 DIAGNOSIS — I82521 Chronic embolism and thrombosis of right iliac vein: Secondary | ICD-10-CM | POA: Diagnosis not present

## 2023-04-25 DIAGNOSIS — I5032 Chronic diastolic (congestive) heart failure: Secondary | ICD-10-CM | POA: Diagnosis not present

## 2023-04-25 DIAGNOSIS — I70223 Atherosclerosis of native arteries of extremities with rest pain, bilateral legs: Secondary | ICD-10-CM | POA: Diagnosis not present

## 2023-04-26 ENCOUNTER — Ambulatory Visit (INDEPENDENT_AMBULATORY_CARE_PROVIDER_SITE_OTHER): Payer: Self-pay | Admitting: Nurse Practitioner

## 2023-04-26 ENCOUNTER — Encounter: Payer: Self-pay | Admitting: Nurse Practitioner

## 2023-04-26 VITALS — BP 110/69 | HR 81 | Ht 62.0 in | Wt 149.0 lb

## 2023-04-26 DIAGNOSIS — J9859 Other diseases of mediastinum, not elsewhere classified: Secondary | ICD-10-CM | POA: Diagnosis not present

## 2023-04-26 DIAGNOSIS — K922 Gastrointestinal hemorrhage, unspecified: Secondary | ICD-10-CM | POA: Diagnosis not present

## 2023-04-26 DIAGNOSIS — I70221 Atherosclerosis of native arteries of extremities with rest pain, right leg: Secondary | ICD-10-CM | POA: Diagnosis not present

## 2023-04-26 DIAGNOSIS — I5020 Unspecified systolic (congestive) heart failure: Secondary | ICD-10-CM

## 2023-04-26 MED ORDER — AMIODARONE HCL 200 MG PO TABS: 200.0000 mg | ORAL_TABLET | Freq: Two times a day (BID) | ORAL | 1 refills | Status: DC

## 2023-04-26 MED ORDER — ATORVASTATIN CALCIUM 40 MG PO TABS: 40.0000 mg | ORAL_TABLET | Freq: Every day | ORAL | 1 refills | Status: DC

## 2023-04-26 MED ORDER — APIXABAN 5 MG PO TABS: 5.0000 mg | ORAL_TABLET | Freq: Two times a day (BID) | ORAL | 1 refills | Status: DC

## 2023-04-26 MED ORDER — METOPROLOL TARTRATE 100 MG PO TABS: 100.0000 mg | ORAL_TABLET | Freq: Two times a day (BID) | ORAL | 1 refills | Status: DC

## 2023-04-26 MED ORDER — SUCRALFATE 1 GM/10ML PO SUSP: 1.0000 g | Freq: Four times a day (QID) | ORAL | 0 refills | Status: DC

## 2023-04-26 MED ORDER — PANTOPRAZOLE SODIUM 40 MG PO TBEC: 40.0000 mg | DELAYED_RELEASE_TABLET | Freq: Two times a day (BID) | ORAL | 11 refills | Status: DC

## 2023-04-26 NOTE — Patient Instructions (Addendum)
1. Gastrointestinal hemorrhage, unspecified gastrointestinal hemorrhage type  - Ambulatory referral to Gastroenterology   2. Lesion of mediastinum  - Ambulatory referral to Cardiothoracic Surgery   3. Heart failure with mildly reduced ejection fraction (HFmrEF, 41-49%) (HCC)  - CBC - Comprehensive metabolic panel   4. Critical limb ischemia of right lower extremity (HCC)  -keep upcoming appointment with vascular  Follow up:  Follow up in 3 months

## 2023-04-26 NOTE — Progress Notes (Signed)
Subjective   Patient ID: Sarah Wolf, female    DOB: Jan 18, 1935, 88 y.o.   MRN: [id number]  Chief Complaint  Patient presents with   Hospitalization Follow-up    Uses [facility]    Referring provider: Ivonne Andrew, NP  Sarah Wolf is a 88 y.o. female with No past medical history on file.  Recent significant events:  Hospital admission: 03/13/23  Hospital course:   Critical limb ischemia right lower extremity likely secondary to cardioembolic event CT angiogram notable for acute occlusion right SFA and left popliteal artery.  Vascular surgery performed angiogram/aortogram with peripheral thrombectomy with SFA stent.  Follow-up 4-6 weeks vascular surgery with RLE duplex/ABIs.  Continue Eliquis.   Upper GI bleed secondary to esophageal and duodenal ulcers Status post endoscopy 9/7 by GI noting esophageal and duodenal ulcers in the setting of anticoagulation.  Continue Protonix 40 mg p.o. twice daily.  Carafate 3 times daily AC/at bedtime x 2 weeks.  Outpatient follow-up with GI.   Sepsis, not POA, secondary to HCAP Concerns of pneumonia.  Currently on cefepime.  Will check procalcitonin.   Acute respiratory failure with hypoxia Suspect aspiration event Previously completed Unasyn, Rocephin and cefepime.  Oxygen now titrated off.   Acute encephalopathy: Resolved EEG, CT head and ammonia levels are negative   Paroxysmal atrial fibrillation with RVR Currently patient is on metoprolol, amiodarone, and Eliquis.  Outpatient follow-up with cardiology.   Acute on chronic systolic congestive heart failure EF is 45%.  Continue metoprolol.  Outpatient follow-up with cardiology.  Recommend maintaining daily weights.  Fluid restriction 1800 mL/day.   Hx DVT Eliquis   Type 2 diabetes mellitus Hemoglobin A1c 6.6.  Diet controlled.   Urinary retention Resolved   Lesion of mediastinum Noted incidental finding on CT chest of nodule anterior mediastinum. Outpatient CT surgery  follow-up   Weakness/debility/deconditioning: Seen by PT and OT during hospitalization, discharging to SNF for further debilitation.   Large diaphragmatic hernia Stable follow-up outpatient   Poor perfusion of her upper extremities Vascular Dopplers did not show any obvious obstruction  HPI  Patient presents today for hospital follow-up and to establish care.  Overall she states that she has been stable since hospital discharge.  She does have her follow-up with cardiology and vascular scheduled.  She does need a referral to GI and CT surgery as noted above.  Patient does need refills on medications today.  She was discharged to skilled nursing facility/rehab.  She is currently now at home living by herself.  She does have family close by.  She does have [facility] physical therapy and Occupational Therapy coming out to the home. Denies f/c/s, n/v/d, hemoptysis, PND, leg swelling Denies chest pain or edema    Allergies  Allergen Reactions   Penicillin G Hives    There is no immunization history for the selected administration types on file for this patient.  Tobacco History: Social History   Tobacco Use  Smoking Status Never  Smokeless Tobacco Never   Counseling given: Not Answered   Outpatient Encounter Medications as of 04/26/2023  Medication Sig   [DISCONTINUED] amiodarone (PACERONE) 200 MG tablet Take 1 tablet (200 mg total) by mouth 2 (two) times daily.   [DISCONTINUED] apixaban (ELIQUIS) 5 MG TABS tablet Take 1 tablet (5 mg total) by mouth 2 (two) times daily.   [DISCONTINUED] atorvastatin (LIPITOR) 40 MG tablet Take 1 tablet (40 mg total) by mouth daily.   [DISCONTINUED] metoprolol tartrate (LOPRESSOR) 100 MG tablet Take  1 tablet (100 mg total) by mouth 2 (two) times daily.   [DISCONTINUED] pantoprazole (PROTONIX) 40 MG tablet Take 1 tablet (40 mg total) by mouth 2 (two) times daily.   [DISCONTINUED] sucralfate (CARAFATE) 1 GM/10ML suspension Take 10  mLs (1 g total) by mouth every 6 (six) hours for 14 days.   amiodarone (PACERONE) 200 MG tablet Take 1 tablet (200 mg total) by mouth 2 (two) times daily.   apixaban (ELIQUIS) 5 MG TABS tablet Take 1 tablet (5 mg total) by mouth 2 (two) times daily.   atorvastatin (LIPITOR) 40 MG tablet Take 1 tablet (40 mg total) by mouth daily.   metoprolol tartrate (LOPRESSOR) 100 MG tablet Take 1 tablet (100 mg total) by mouth 2 (two) times daily.   pantoprazole (PROTONIX) 40 MG tablet Take 1 tablet (40 mg total) by mouth 2 (two) times daily.   sucralfate (CARAFATE) 1 GM/10ML suspension Take 10 mLs (1 g total) by mouth every 6 (six) hours.   No facility-administered encounter medications on file as of 04/26/2023.    Review of Systems  Review of Systems  Constitutional: Negative.   HENT: Negative.    Cardiovascular: Negative.   Gastrointestinal: Negative.   Allergic/Immunologic: Negative.   Neurological: Negative.   Psychiatric/Behavioral: Negative.       Objective:   BP 110/69   Pulse 81   Ht 5\' 2"  (1.575 m)   Wt 149 lb (67.6 kg)   SpO2 96%   BMI 27.25 kg/m   Wt Readings from Last 5 Encounters:  04/26/23 149 lb (67.6 kg)  03/30/23 162 lb 7.7 oz (73.7 kg)  03/08/19 140 lb (63.5 kg)     Physical Exam Vitals and nursing note reviewed.  Constitutional:      General: She is not in acute distress.    Appearance: She is well-developed.  Cardiovascular:     Rate and Rhythm: Normal rate and regular rhythm.  Pulmonary:     Effort: Pulmonary effort is normal.     Breath sounds: Normal breath sounds.  Neurological:     Mental Status: She is alert and oriented to person, place, and time.       Assessment & Plan:   Gastrointestinal hemorrhage, unspecified gastrointestinal hemorrhage type -     Ambulatory referral to Gastroenterology  Lesion of mediastinum -     Ambulatory referral to Cardiothoracic Surgery  Heart failure with mildly reduced ejection fraction (HFmrEF, 41-49%)  (HCC) -     CBC -     Comprehensive metabolic panel  Critical limb ischemia of right lower extremity (HCC)  Other orders -     Amiodarone HCl; Take 1 tablet (200 mg total) by mouth 2 (two) times daily.  Dispense: 90 tablet; Refill: 1 -     Apixaban; Take 1 tablet (5 mg total) by mouth 2 (two) times daily.  Dispense: 90 tablet; Refill: 1 -     Atorvastatin Calcium; Take 1 tablet (40 mg total) by mouth daily.  Dispense: 90 tablet; Refill: 1 -     Metoprolol Tartrate; Take 1 tablet (100 mg total) by mouth 2 (two) times daily.  Dispense: 90 tablet; Refill: 1 -     Pantoprazole Sodium; Take 1 tablet (40 mg total) by mouth 2 (two) times daily.  Dispense: 60 tablet; Refill: 11 -     Sucralfate; Take 10 mLs (1 g total) by mouth every 6 (six) hours.  Dispense: 414 mL; Refill: 0     Return in about 3 months (around  07/27/2023).   Ivonne Andrew, NP 04/26/2023

## 2023-04-27 ENCOUNTER — Other Ambulatory Visit: Payer: Self-pay | Admitting: Nurse Practitioner

## 2023-04-27 DIAGNOSIS — I82432 Acute embolism and thrombosis of left popliteal vein: Secondary | ICD-10-CM | POA: Diagnosis not present

## 2023-04-27 DIAGNOSIS — I5032 Chronic diastolic (congestive) heart failure: Secondary | ICD-10-CM | POA: Diagnosis not present

## 2023-04-27 DIAGNOSIS — J9859 Other diseases of mediastinum, not elsewhere classified: Secondary | ICD-10-CM | POA: Diagnosis not present

## 2023-04-27 DIAGNOSIS — I480 Paroxysmal atrial fibrillation: Secondary | ICD-10-CM | POA: Diagnosis not present

## 2023-04-27 DIAGNOSIS — L97811 Non-pressure chronic ulcer of other part of right lower leg limited to breakdown of skin: Secondary | ICD-10-CM | POA: Diagnosis not present

## 2023-04-27 DIAGNOSIS — S90822D Blister (nonthermal), left foot, subsequent encounter: Secondary | ICD-10-CM | POA: Diagnosis not present

## 2023-04-27 DIAGNOSIS — E876 Hypokalemia: Secondary | ICD-10-CM

## 2023-04-27 DIAGNOSIS — E1151 Type 2 diabetes mellitus with diabetic peripheral angiopathy without gangrene: Secondary | ICD-10-CM | POA: Diagnosis not present

## 2023-04-27 DIAGNOSIS — K551 Chronic vascular disorders of intestine: Secondary | ICD-10-CM | POA: Diagnosis not present

## 2023-04-27 DIAGNOSIS — I70223 Atherosclerosis of native arteries of extremities with rest pain, bilateral legs: Secondary | ICD-10-CM | POA: Diagnosis not present

## 2023-04-27 DIAGNOSIS — L97821 Non-pressure chronic ulcer of other part of left lower leg limited to breakdown of skin: Secondary | ICD-10-CM | POA: Diagnosis not present

## 2023-04-27 DIAGNOSIS — I82521 Chronic embolism and thrombosis of right iliac vein: Secondary | ICD-10-CM | POA: Diagnosis not present

## 2023-04-27 DIAGNOSIS — I110 Hypertensive heart disease with heart failure: Secondary | ICD-10-CM | POA: Diagnosis not present

## 2023-04-27 DIAGNOSIS — I872 Venous insufficiency (chronic) (peripheral): Secondary | ICD-10-CM | POA: Diagnosis not present

## 2023-04-27 DIAGNOSIS — I82411 Acute embolism and thrombosis of right femoral vein: Secondary | ICD-10-CM | POA: Diagnosis not present

## 2023-04-27 DIAGNOSIS — I701 Atherosclerosis of renal artery: Secondary | ICD-10-CM | POA: Diagnosis not present

## 2023-04-27 DIAGNOSIS — I5023 Acute on chronic systolic (congestive) heart failure: Secondary | ICD-10-CM | POA: Diagnosis not present

## 2023-04-27 LAB — COMPREHENSIVE METABOLIC PANEL
ALT: 11 [IU]/L (ref 0–32)
AST: 20 [IU]/L (ref 0–40)
Albumin: 3.3 g/dL — ABNORMAL LOW (ref 3.7–4.7)
Alkaline Phosphatase: 65 [IU]/L (ref 44–121)
BUN/Creatinine Ratio: 19 (ref 12–28)
BUN: 22 mg/dL (ref 8–27)
Bilirubin Total: 0.7 mg/dL (ref 0.0–1.2)
CO2: 30 mmol/L — ABNORMAL HIGH (ref 20–29)
Calcium: 8.2 mg/dL — ABNORMAL LOW (ref 8.7–10.3)
Chloride: 86 mmol/L — ABNORMAL LOW (ref 96–106)
Creatinine, Ser: 1.16 mg/dL — ABNORMAL HIGH (ref 0.57–1.00)
Globulin, Total: 2.5 g/dL (ref 1.5–4.5)
Glucose: 125 mg/dL — ABNORMAL HIGH (ref 70–99)
Potassium: 2.8 mmol/L — ABNORMAL LOW (ref 3.5–5.2)
Sodium: 133 mmol/L — ABNORMAL LOW (ref 134–144)
Total Protein: 5.8 g/dL — ABNORMAL LOW (ref 6.0–8.5)
eGFR: 46 mL/min/{1.73_m2} — ABNORMAL LOW (ref >59)

## 2023-04-27 LAB — CBC
Hematocrit: 34.9 % (ref 34.0–46.6)
Hemoglobin: 10.8 g/dL — ABNORMAL LOW (ref 11.1–15.9)
MCH: 29.8 pg (ref 26.6–33.0)
MCHC: 30.9 g/dL — ABNORMAL LOW (ref 31.5–35.7)
MCV: 96 fL (ref 79–97)
Platelets: 266 10*3/uL (ref 150–450)
RBC: 3.63 x10E6/uL — ABNORMAL LOW (ref 3.77–5.28)
RDW: 13.9 % (ref 11.7–15.4)
WBC: 8.2 10*3/uL (ref 3.4–10.8)

## 2023-04-27 MED ORDER — POTASSIUM CHLORIDE CRYS ER 20 MEQ PO TBCR: 20.0000 meq | EXTENDED_RELEASE_TABLET | Freq: Every day | ORAL | 0 refills | Status: DC

## 2023-04-28 ENCOUNTER — Other Ambulatory Visit: Payer: Self-pay

## 2023-04-28 ENCOUNTER — Telehealth: Payer: Self-pay | Admitting: Nurse Practitioner

## 2023-04-28 DIAGNOSIS — L97811 Non-pressure chronic ulcer of other part of right lower leg limited to breakdown of skin: Secondary | ICD-10-CM | POA: Diagnosis not present

## 2023-04-28 DIAGNOSIS — I110 Hypertensive heart disease with heart failure: Secondary | ICD-10-CM | POA: Diagnosis not present

## 2023-04-28 DIAGNOSIS — S90822D Blister (nonthermal), left foot, subsequent encounter: Secondary | ICD-10-CM | POA: Diagnosis not present

## 2023-04-28 DIAGNOSIS — E1151 Type 2 diabetes mellitus with diabetic peripheral angiopathy without gangrene: Secondary | ICD-10-CM | POA: Diagnosis not present

## 2023-04-28 DIAGNOSIS — I82432 Acute embolism and thrombosis of left popliteal vein: Secondary | ICD-10-CM | POA: Diagnosis not present

## 2023-04-28 DIAGNOSIS — I872 Venous insufficiency (chronic) (peripheral): Secondary | ICD-10-CM | POA: Diagnosis not present

## 2023-04-28 DIAGNOSIS — I5023 Acute on chronic systolic (congestive) heart failure: Secondary | ICD-10-CM | POA: Diagnosis not present

## 2023-04-28 DIAGNOSIS — J9859 Other diseases of mediastinum, not elsewhere classified: Secondary | ICD-10-CM | POA: Diagnosis not present

## 2023-04-28 DIAGNOSIS — I5032 Chronic diastolic (congestive) heart failure: Secondary | ICD-10-CM | POA: Diagnosis not present

## 2023-04-28 DIAGNOSIS — K551 Chronic vascular disorders of intestine: Secondary | ICD-10-CM | POA: Diagnosis not present

## 2023-04-28 DIAGNOSIS — I70223 Atherosclerosis of native arteries of extremities with rest pain, bilateral legs: Secondary | ICD-10-CM | POA: Diagnosis not present

## 2023-04-28 DIAGNOSIS — I480 Paroxysmal atrial fibrillation: Secondary | ICD-10-CM | POA: Diagnosis not present

## 2023-04-28 DIAGNOSIS — L97821 Non-pressure chronic ulcer of other part of left lower leg limited to breakdown of skin: Secondary | ICD-10-CM | POA: Diagnosis not present

## 2023-04-28 DIAGNOSIS — I82411 Acute embolism and thrombosis of right femoral vein: Secondary | ICD-10-CM | POA: Diagnosis not present

## 2023-04-28 DIAGNOSIS — I82521 Chronic embolism and thrombosis of right iliac vein: Secondary | ICD-10-CM | POA: Diagnosis not present

## 2023-04-28 DIAGNOSIS — I701 Atherosclerosis of renal artery: Secondary | ICD-10-CM | POA: Diagnosis not present

## 2023-04-28 MED ORDER — FUROSEMIDE 20 MG PO TABS: 20.0000 mg | ORAL_TABLET | Freq: Every day | ORAL | 0 refills | Status: DC

## 2023-04-28 NOTE — Telephone Encounter (Signed)
Pt Nephew Mr. Sarah Wolf is asking about a medication and wants a nurse or provider to call him   [phone]

## 2023-04-28 NOTE — Telephone Encounter (Signed)
Done Kh 

## 2023-04-28 NOTE — Telephone Encounter (Signed)
Per Archie Patten pt will start taking lasix 20 mg for five days and review on Monday.

## 2023-05-01 ENCOUNTER — Telehealth: Payer: Self-pay | Admitting: Internal Medicine

## 2023-05-01 ENCOUNTER — Encounter: Payer: Self-pay | Admitting: Nurse Practitioner

## 2023-05-01 ENCOUNTER — Inpatient Hospital Stay: Payer: Self-pay | Admitting: Nurse Practitioner

## 2023-05-01 ENCOUNTER — Telehealth: Payer: Self-pay

## 2023-05-01 ENCOUNTER — Telehealth (INDEPENDENT_AMBULATORY_CARE_PROVIDER_SITE_OTHER): Payer: Medicare Other | Admitting: Nurse Practitioner

## 2023-05-01 DIAGNOSIS — I82411 Acute embolism and thrombosis of right femoral vein: Secondary | ICD-10-CM | POA: Diagnosis not present

## 2023-05-01 DIAGNOSIS — E1151 Type 2 diabetes mellitus with diabetic peripheral angiopathy without gangrene: Secondary | ICD-10-CM | POA: Diagnosis not present

## 2023-05-01 DIAGNOSIS — I872 Venous insufficiency (chronic) (peripheral): Secondary | ICD-10-CM | POA: Diagnosis not present

## 2023-05-01 DIAGNOSIS — I110 Hypertensive heart disease with heart failure: Secondary | ICD-10-CM | POA: Diagnosis not present

## 2023-05-01 DIAGNOSIS — Z7189 Other specified counseling: Secondary | ICD-10-CM

## 2023-05-01 DIAGNOSIS — I5032 Chronic diastolic (congestive) heart failure: Secondary | ICD-10-CM | POA: Diagnosis not present

## 2023-05-01 DIAGNOSIS — S90822D Blister (nonthermal), left foot, subsequent encounter: Secondary | ICD-10-CM | POA: Diagnosis not present

## 2023-05-01 DIAGNOSIS — L97821 Non-pressure chronic ulcer of other part of left lower leg limited to breakdown of skin: Secondary | ICD-10-CM | POA: Diagnosis not present

## 2023-05-01 DIAGNOSIS — I701 Atherosclerosis of renal artery: Secondary | ICD-10-CM | POA: Diagnosis not present

## 2023-05-01 DIAGNOSIS — I70223 Atherosclerosis of native arteries of extremities with rest pain, bilateral legs: Secondary | ICD-10-CM | POA: Diagnosis not present

## 2023-05-01 DIAGNOSIS — J9859 Other diseases of mediastinum, not elsewhere classified: Secondary | ICD-10-CM | POA: Diagnosis not present

## 2023-05-01 DIAGNOSIS — L97811 Non-pressure chronic ulcer of other part of right lower leg limited to breakdown of skin: Secondary | ICD-10-CM | POA: Diagnosis not present

## 2023-05-01 DIAGNOSIS — I5023 Acute on chronic systolic (congestive) heart failure: Secondary | ICD-10-CM | POA: Diagnosis not present

## 2023-05-01 DIAGNOSIS — K551 Chronic vascular disorders of intestine: Secondary | ICD-10-CM | POA: Diagnosis not present

## 2023-05-01 DIAGNOSIS — I82432 Acute embolism and thrombosis of left popliteal vein: Secondary | ICD-10-CM | POA: Diagnosis not present

## 2023-05-01 DIAGNOSIS — I480 Paroxysmal atrial fibrillation: Secondary | ICD-10-CM | POA: Diagnosis not present

## 2023-05-01 DIAGNOSIS — I82521 Chronic embolism and thrombosis of right iliac vein: Secondary | ICD-10-CM | POA: Diagnosis not present

## 2023-05-01 NOTE — Telephone Encounter (Signed)
Burna Mortimer calling from [facility] returning the nurses phone call. Please advise

## 2023-05-01 NOTE — Patient Instructions (Signed)
1. DNR (do not resuscitate) discussion  -Will fill out DNR forms for patient and health power of attorney   Follow up:  Follow up as scheduled

## 2023-05-01 NOTE — Progress Notes (Signed)
Virtual Visit via Telephone Note  I connected with Sarah Wolf on 05/01/23 at 11:20 AM EDT by telephone and verified that I am speaking with the correct person using two identifiers.  Location: Patient: home Provider: office   I discussed the limitations, risks, security and privacy concerns of performing an evaluation and management service by telephone and the availability of in person appointments. I also discussed with the patient that there may be a patient responsible charge related to this service. The patient expressed understanding and agreed to proceed.   History of Present Illness:  Patient presents today for telephone visit.  Patient states that she does need a DNR form to post in her house if needed if EMS is required.  Patient is listed as DNR in the [place] system.  Her nephew is her healthcare power of attorney and both her and her nephew are on the phone today. Denies f/c/s, n/v/d, hemoptysis, PND, leg swelling Denies chest pain or edema   Observations/Objective:     04/26/2023   11:12 AM 03/30/2023   11:34 PM 03/30/2023    8:02 PM  Vitals with BMI  Height 5\' 2"     Weight 149 lbs    BMI 27.25    Systolic 110 142 161  Diastolic 69 105 91  Pulse 81 91 88      Assessment and Plan:  1. DNR (do not resuscitate) discussion  -Will fill out DNR forms for patient and health power of attorney   Follow up:  Follow up as scheduled    I discussed the assessment and treatment plan with the patient. The patient was provided an opportunity to ask questions and all were answered. The patient agreed with the plan and demonstrated an understanding of the instructions.   The patient was advised to call back or seek an in-person evaluation if the symptoms worsen or if the condition fails to improve as anticipated.  I provided 23 minutes of non-face-to-face time during this encounter.   Ivonne Andrew, NP

## 2023-05-01 NOTE — Telephone Encounter (Signed)
Left a message for [facility] [phone].... orders need to be sent to the pts PCP... Dr Tenny Craw consulted on the pt in the hosp due to AFIB.[facility] did not keep her post hosp follow up.. pt being managed by her PCP Angus Seller NP and vascular surgery.   Will fax orders back to them with a note to send to her PCP... fax [phone].

## 2023-05-02 DIAGNOSIS — I110 Hypertensive heart disease with heart failure: Secondary | ICD-10-CM | POA: Diagnosis not present

## 2023-05-02 DIAGNOSIS — I70223 Atherosclerosis of native arteries of extremities with rest pain, bilateral legs: Secondary | ICD-10-CM | POA: Diagnosis not present

## 2023-05-02 DIAGNOSIS — J9859 Other diseases of mediastinum, not elsewhere classified: Secondary | ICD-10-CM | POA: Diagnosis not present

## 2023-05-02 DIAGNOSIS — L97821 Non-pressure chronic ulcer of other part of left lower leg limited to breakdown of skin: Secondary | ICD-10-CM | POA: Diagnosis not present

## 2023-05-02 DIAGNOSIS — I480 Paroxysmal atrial fibrillation: Secondary | ICD-10-CM | POA: Diagnosis not present

## 2023-05-02 DIAGNOSIS — I5032 Chronic diastolic (congestive) heart failure: Secondary | ICD-10-CM | POA: Diagnosis not present

## 2023-05-02 DIAGNOSIS — I82411 Acute embolism and thrombosis of right femoral vein: Secondary | ICD-10-CM | POA: Diagnosis not present

## 2023-05-02 DIAGNOSIS — I5023 Acute on chronic systolic (congestive) heart failure: Secondary | ICD-10-CM | POA: Diagnosis not present

## 2023-05-02 DIAGNOSIS — I701 Atherosclerosis of renal artery: Secondary | ICD-10-CM | POA: Diagnosis not present

## 2023-05-02 DIAGNOSIS — K551 Chronic vascular disorders of intestine: Secondary | ICD-10-CM | POA: Diagnosis not present

## 2023-05-02 DIAGNOSIS — I82432 Acute embolism and thrombosis of left popliteal vein: Secondary | ICD-10-CM | POA: Diagnosis not present

## 2023-05-02 DIAGNOSIS — S90822D Blister (nonthermal), left foot, subsequent encounter: Secondary | ICD-10-CM | POA: Diagnosis not present

## 2023-05-02 DIAGNOSIS — I872 Venous insufficiency (chronic) (peripheral): Secondary | ICD-10-CM | POA: Diagnosis not present

## 2023-05-02 DIAGNOSIS — I82521 Chronic embolism and thrombosis of right iliac vein: Secondary | ICD-10-CM | POA: Diagnosis not present

## 2023-05-02 DIAGNOSIS — L97811 Non-pressure chronic ulcer of other part of right lower leg limited to breakdown of skin: Secondary | ICD-10-CM | POA: Diagnosis not present

## 2023-05-02 DIAGNOSIS — E1151 Type 2 diabetes mellitus with diabetic peripheral angiopathy without gangrene: Secondary | ICD-10-CM | POA: Diagnosis not present

## 2023-05-03 ENCOUNTER — Ambulatory Visit (HOSPITAL_COMMUNITY)
Admission: RE | Admit: 2023-05-03 | Discharge: 2023-05-03 | Disposition: A | Discharge status: Home / Self Care | Payer: Medicare Other | Source: Ambulatory Visit | Attending: Vascular Surgery

## 2023-05-03 ENCOUNTER — Encounter: Payer: Self-pay | Admitting: Vascular Surgery

## 2023-05-03 ENCOUNTER — Telehealth: Payer: Self-pay | Admitting: Internal Medicine

## 2023-05-03 ENCOUNTER — Ambulatory Visit (INDEPENDENT_AMBULATORY_CARE_PROVIDER_SITE_OTHER)
Admission: RE | Admit: 2023-05-03 | Discharge: 2023-05-03 | Disposition: A | Discharge status: Home / Self Care | Payer: Medicare Other | Source: Ambulatory Visit | Attending: Vascular Surgery | Admitting: Vascular Surgery

## 2023-05-03 ENCOUNTER — Ambulatory Visit: Payer: Medicare Other | Admitting: Vascular Surgery

## 2023-05-03 VITALS — BP 158/89 | HR 84 | Temp 97.2°F | Ht 62.0 in

## 2023-05-03 DIAGNOSIS — I110 Hypertensive heart disease with heart failure: Secondary | ICD-10-CM | POA: Diagnosis not present

## 2023-05-03 DIAGNOSIS — I5043 Acute on chronic combined systolic (congestive) and diastolic (congestive) heart failure: Secondary | ICD-10-CM | POA: Diagnosis not present

## 2023-05-03 DIAGNOSIS — I70221 Atherosclerosis of native arteries of extremities with rest pain, right leg: Secondary | ICD-10-CM

## 2023-05-03 DIAGNOSIS — I82521 Chronic embolism and thrombosis of right iliac vein: Secondary | ICD-10-CM | POA: Diagnosis not present

## 2023-05-03 DIAGNOSIS — I82432 Acute embolism and thrombosis of left popliteal vein: Secondary | ICD-10-CM | POA: Diagnosis not present

## 2023-05-03 DIAGNOSIS — R0989 Other specified symptoms and signs involving the circulatory and respiratory systems: Secondary | ICD-10-CM | POA: Diagnosis not present

## 2023-05-03 DIAGNOSIS — L97819 Non-pressure chronic ulcer of other part of right lower leg with unspecified severity: Secondary | ICD-10-CM | POA: Diagnosis not present

## 2023-05-03 DIAGNOSIS — I5023 Acute on chronic systolic (congestive) heart failure: Secondary | ICD-10-CM | POA: Diagnosis not present

## 2023-05-03 DIAGNOSIS — K551 Chronic vascular disorders of intestine: Secondary | ICD-10-CM | POA: Diagnosis not present

## 2023-05-03 DIAGNOSIS — I517 Cardiomegaly: Secondary | ICD-10-CM | POA: Diagnosis not present

## 2023-05-03 DIAGNOSIS — I5032 Chronic diastolic (congestive) heart failure: Secondary | ICD-10-CM | POA: Diagnosis not present

## 2023-05-03 DIAGNOSIS — I701 Atherosclerosis of renal artery: Secondary | ICD-10-CM | POA: Diagnosis not present

## 2023-05-03 DIAGNOSIS — I10 Essential (primary) hypertension: Secondary | ICD-10-CM | POA: Diagnosis not present

## 2023-05-03 DIAGNOSIS — L97829 Non-pressure chronic ulcer of other part of left lower leg with unspecified severity: Secondary | ICD-10-CM | POA: Diagnosis not present

## 2023-05-03 DIAGNOSIS — J9601 Acute respiratory failure with hypoxia: Secondary | ICD-10-CM | POA: Diagnosis not present

## 2023-05-03 DIAGNOSIS — S90822D Blister (nonthermal), left foot, subsequent encounter: Secondary | ICD-10-CM | POA: Diagnosis not present

## 2023-05-03 DIAGNOSIS — I70223 Atherosclerosis of native arteries of extremities with rest pain, bilateral legs: Secondary | ICD-10-CM

## 2023-05-03 DIAGNOSIS — R918 Other nonspecific abnormal finding of lung field: Secondary | ICD-10-CM | POA: Diagnosis not present

## 2023-05-03 DIAGNOSIS — R609 Edema, unspecified: Secondary | ICD-10-CM | POA: Diagnosis not present

## 2023-05-03 DIAGNOSIS — Z7901 Long term (current) use of anticoagulants: Secondary | ICD-10-CM | POA: Diagnosis not present

## 2023-05-03 DIAGNOSIS — Z79899 Other long term (current) drug therapy: Secondary | ICD-10-CM | POA: Diagnosis not present

## 2023-05-03 DIAGNOSIS — E1151 Type 2 diabetes mellitus with diabetic peripheral angiopathy without gangrene: Secondary | ICD-10-CM | POA: Diagnosis not present

## 2023-05-03 DIAGNOSIS — J9859 Other diseases of mediastinum, not elsewhere classified: Secondary | ICD-10-CM | POA: Diagnosis not present

## 2023-05-03 DIAGNOSIS — R0902 Hypoxemia: Secondary | ICD-10-CM | POA: Diagnosis not present

## 2023-05-03 DIAGNOSIS — I82411 Acute embolism and thrombosis of right femoral vein: Secondary | ICD-10-CM | POA: Diagnosis not present

## 2023-05-03 DIAGNOSIS — J90 Pleural effusion, not elsewhere classified: Secondary | ICD-10-CM | POA: Diagnosis not present

## 2023-05-03 DIAGNOSIS — I509 Heart failure, unspecified: Secondary | ICD-10-CM | POA: Diagnosis not present

## 2023-05-03 DIAGNOSIS — Z95828 Presence of other vascular implants and grafts: Secondary | ICD-10-CM | POA: Diagnosis not present

## 2023-05-03 DIAGNOSIS — J9621 Acute and chronic respiratory failure with hypoxia: Secondary | ICD-10-CM | POA: Diagnosis not present

## 2023-05-03 DIAGNOSIS — Z8711 Personal history of peptic ulcer disease: Secondary | ICD-10-CM | POA: Diagnosis not present

## 2023-05-03 DIAGNOSIS — M7989 Other specified soft tissue disorders: Secondary | ICD-10-CM | POA: Diagnosis not present

## 2023-05-03 DIAGNOSIS — Z880 Allergy status to penicillin: Secondary | ICD-10-CM | POA: Diagnosis not present

## 2023-05-03 DIAGNOSIS — I70202 Unspecified atherosclerosis of native arteries of extremities, left leg: Secondary | ICD-10-CM | POA: Diagnosis not present

## 2023-05-03 DIAGNOSIS — K209 Esophagitis, unspecified without bleeding: Secondary | ICD-10-CM | POA: Diagnosis not present

## 2023-05-03 DIAGNOSIS — E876 Hypokalemia: Secondary | ICD-10-CM | POA: Diagnosis not present

## 2023-05-03 DIAGNOSIS — L97821 Non-pressure chronic ulcer of other part of left lower leg limited to breakdown of skin: Secondary | ICD-10-CM | POA: Diagnosis not present

## 2023-05-03 DIAGNOSIS — K449 Diaphragmatic hernia without obstruction or gangrene: Secondary | ICD-10-CM | POA: Diagnosis not present

## 2023-05-03 DIAGNOSIS — I700 Atherosclerosis of aorta: Secondary | ICD-10-CM | POA: Diagnosis not present

## 2023-05-03 DIAGNOSIS — I4819 Other persistent atrial fibrillation: Secondary | ICD-10-CM | POA: Diagnosis not present

## 2023-05-03 DIAGNOSIS — I480 Paroxysmal atrial fibrillation: Secondary | ICD-10-CM | POA: Diagnosis not present

## 2023-05-03 DIAGNOSIS — Z86718 Personal history of other venous thrombosis and embolism: Secondary | ICD-10-CM | POA: Diagnosis not present

## 2023-05-03 DIAGNOSIS — L97811 Non-pressure chronic ulcer of other part of right lower leg limited to breakdown of skin: Secondary | ICD-10-CM | POA: Diagnosis not present

## 2023-05-03 DIAGNOSIS — I872 Venous insufficiency (chronic) (peripheral): Secondary | ICD-10-CM | POA: Diagnosis not present

## 2023-05-03 DIAGNOSIS — E8770 Fluid overload, unspecified: Secondary | ICD-10-CM | POA: Diagnosis not present

## 2023-05-03 DIAGNOSIS — I3139 Other pericardial effusion (noninflammatory): Secondary | ICD-10-CM | POA: Diagnosis not present

## 2023-05-03 DIAGNOSIS — H9193 Unspecified hearing loss, bilateral: Secondary | ICD-10-CM | POA: Diagnosis not present

## 2023-05-03 DIAGNOSIS — R1310 Dysphagia, unspecified: Secondary | ICD-10-CM | POA: Diagnosis not present

## 2023-05-03 DIAGNOSIS — K219 Gastro-esophageal reflux disease without esophagitis: Secondary | ICD-10-CM | POA: Diagnosis not present

## 2023-05-03 LAB — VAS US ABI WITH/WO TBI: Right ABI: 1.09

## 2023-05-03 NOTE — Telephone Encounter (Signed)
Left voicemail to return call to office.  Call to patient spoke with Nephew per DPR and he states they are at the vein specialist now and he will try to get her to go to the ED

## 2023-05-03 NOTE — Telephone Encounter (Signed)
Sarah Wolf from [facility] is requesting a callback regarding pt retaining fluid again, weight went up and feeling fatigue. He stated she was advised to go to the ED but is refusing to do so. Please advise

## 2023-05-03 NOTE — Progress Notes (Signed)
Patient ID: Sarah Wolf, female   DOB: November 03, 1934, 88 y.o.   MRN: [id number]  Reason for Consult: Follow-up   Referred by Tally Joe, MD  Subjective:     HPI:  Sarah Wolf is a 88 y.o. female follows up after right lower extremity lytic catheter placement and stenting of the right SFA.  This was for acute on chronic bilateral lower extremity ischemia after being evaluated in the emergency department.  Patient has had significant swelling of bilateral lower extremities and has had her Lasix dose increased.  She continues to have discoloration of her toes bilaterally but her chief complaint is edema.  She has been placed on anticoagulation.  She has weeping from the legs is followed by outpatient home health nursing.  Past Medical History:  Diagnosis Date   Hypertension    Peripheral arterial disease (HCC)    History reviewed. No pertinent family history. Past Surgical History:  Procedure Laterality Date   ABDOMINAL AORTOGRAM W/LOWER EXTREMITY N/A 03/15/2023   Procedure: ABDOMINAL AORTOGRAM W/LOWER EXTREMITY;  Surgeon: Victorino Sparrow, MD;  Location: [facility] INVASIVE CV LAB;  Service: Cardiovascular;  Laterality: N/A;   BIOPSY  03/26/2023   Procedure: BIOPSY;  Surgeon: Benancio Deeds, MD;  Location: [facility] ENDOSCOPY;  Service: Gastroenterology;;   ESOPHAGOGASTRODUODENOSCOPY (EGD) WITH PROPOFOL N/A 03/26/2023   Procedure: ESOPHAGOGASTRODUODENOSCOPY (EGD) WITH PROPOFOL;  Surgeon: Benancio Deeds, MD;  Location: [facility] ENDOSCOPY;  Service: Gastroenterology;  Laterality: N/A;   ORIF FEMUR FRACTURE Right 03/08/2019   Procedure: OPEN REDUCTION INTERNAL FIXATION (ORIF) DISTAL FEMUR FRACTURE;  Surgeon: Kathryne Hitch, MD;  Location: [facility] ORS;  Service: Orthopedics;  Laterality: Right;   PERIPHERAL VASCULAR INTERVENTION Right 03/16/2023   Procedure: PERIPHERAL VASCULAR INTERVENTION;  Surgeon: Cephus Shelling, MD;  Location: [facility] INVASIVE CV LAB;  Service: Cardiovascular;  Laterality: Right;   SFA   PERIPHERAL VASCULAR THROMBECTOMY N/A 03/16/2023   Procedure: LYSIS RECHECK;  Surgeon: Cephus Shelling, MD;  Location: [facility] INVASIVE CV LAB;  Service: Cardiovascular;  Laterality: N/A;    Short Social History:  Social History   Tobacco Use   Smoking status: Never   Smokeless tobacco: Never  Substance Use Topics   Alcohol use: Not Currently    Allergies  Allergen Reactions   Penicillin G Hives    Current Outpatient Medications  Medication Sig Dispense Refill   amiodarone (PACERONE) 200 MG tablet Take 1 tablet (200 mg total) by mouth 2 (two) times daily. 90 tablet 1   apixaban (ELIQUIS) 5 MG TABS tablet Take 1 tablet (5 mg total) by mouth 2 (two) times daily. 90 tablet 1   atorvastatin (LIPITOR) 40 MG tablet Take 1 tablet (40 mg total) by mouth daily. 90 tablet 1   furosemide (LASIX) 20 MG tablet Take 1 tablet (20 mg total) by mouth daily. For 5 days . 5 tablet 0   metoprolol tartrate (LOPRESSOR) 100 MG tablet Take 1 tablet (100 mg total) by mouth 2 (two) times daily. 90 tablet 1   pantoprazole (PROTONIX) 40 MG tablet Take 1 tablet (40 mg total) by mouth 2 (two) times daily. 60 tablet 11   potassium chloride SA (KLOR-CON M) 20 MEQ tablet Take 1 tablet (20 mEq total) by mouth daily for 14 days. 14 tablet 0   sucralfate (CARAFATE) 1 GM/10ML suspension Take 10 mLs (1 g total) by mouth every 6 (six) hours. 414 mL 0   No current facility-administered medications for this visit.    Review of Systems  Constitutional:  Constitutional negative. HENT: HENT negative.  Eyes: Eyes negative.  Respiratory: Respiratory negative.  Cardiovascular: Positive for leg swelling.  GI: Gastrointestinal negative.  Musculoskeletal: Positive for leg pain.  Skin: Positive for wound.  Neurological: Neurological negative. Hematologic: Hematologic/lymphatic negative.  Psychiatric: Psychiatric negative.        Objective:  Objective   Vitals:   05/03/23 0949  BP: (!) 158/89  Pulse: 84   Temp: (!) 97.2 F (36.2 C)  TempSrc: Temporal  SpO2: 95%  Height: 5\' 2"  (1.575 m)   Body mass index is 27.25 kg/m.  Physical Exam HENT:     Head: Normocephalic.     Nose: Nose normal.     Mouth/Throat:     Mouth: Mucous membranes are moist.  Eyes:     Pupils: Pupils are equal, round, and reactive to light.  Abdominal:     General: Abdomen is flat.  Musculoskeletal:        General: Swelling present.     Right lower leg: Edema present.     Left lower leg: Edema present.     Comments: Purple discoloration of all toes  Skin:    Comments: Weeping  R cap refill <2s L cap refill 2-3 s  Neurological:     Mental Status: She is alert.  Psychiatric:        Mood and Affect: Mood normal.     Data: ABI Findings:  +---------+------------------+-----+--------+--------+  Right   Rt Pressure (mmHg)IndexWaveformComment   +---------+------------------+-----+--------+--------+  Brachial 137                                      +---------+------------------+-----+--------+--------+  PTA     149               1.09 biphasic          +---------+------------------+-----+--------+--------+  DP                             absent            +---------+------------------+-----+--------+--------+  Great Toe                       Absent            +---------+------------------+-----+--------+--------+   +--------+------------------+-----+--------+-------------+  Left   Lt Pressure (mmHg)IndexWaveformComment        +--------+------------------+-----+--------+-------------+  Brachial                              Not evaluated  +--------+------------------+-----+--------+-------------+  PTA                                   Not evaluated  +--------+------------------+-----+--------+-------------+  DP                                    Not evaluated  +--------+------------------+-----+--------+-------------+    +-------+-------------+-------------+------------+------------+  ABI/TBIToday's ABI  Today's TBI  Previous ABIPrevious TBI  +-------+-------------+-------------+------------+------------+  Right 1.09         0.00         0.00        0.00          +-------+-------------+-------------+------------+------------+  Left  Not evaluatedNot evaluated0.56        0.15          +-------+-------------+-------------+------------+------------+           Summary:  Right: Resting right ankle-brachial index is within normal range. The  right toe-brachial index is abnormal.   RIGHT      PSV cm/sRatioStenosisWaveform  Comments                          +-----------+--------+-----+--------+----------+---------------------------  ----+  CFA Distal 85                   biphasic                                    +-----------+--------+-----+--------+----------+---------------------------  ----+  DFA       61                   biphasic                                    +-----------+--------+-----+--------+----------+---------------------------  ----+  SFA Prox   68                   biphasic                                    +-----------+--------+-----+--------+----------+---------------------------  ----+  TP Trunk   50                   monophasic                                  +-----------+--------+-----+--------+----------+---------------------------  ----+  ATA Prox   51                   biphasic                                    +-----------+--------+-----+--------+----------+---------------------------  ----+  ATA Mid                                   Unable to evaluate  secondary to                                            bandaging                         +-----------+--------+-----+--------+----------+---------------------------  ----+  ATA Distal                                Unable to  evaluate  secondary to  bandaging                         +-----------+--------+-----+--------+----------+---------------------------  ----+  PTA Prox   61                   biphasic                                    +-----------+--------+-----+--------+----------+---------------------------  ----+  PTA Mid                                   Unable to evaluate  secondary to                                            bandaging                         +-----------+--------+-----+--------+----------+---------------------------  ----+  PTA Distal                                Unable to evaluate  secondary to                                            bandaging                         +-----------+--------+-----+--------+----------+---------------------------  ----+  PERO Prox  48                   monophasic                                  +-----------+--------+-----+--------+----------+---------------------------  ----+  PERO Mid                                  Unable to evaluate  secondary to                                            bandaging                         +-----------+--------+-----+--------+----------+---------------------------  ----+  PERO Distal                               Unable to evaluate  secondary to                                            bandaging                         +-----------+--------+-----+--------+----------+---------------------------  ----+  Right Stent(s):  +---------------+--------+--------+--------+--------+  SFA           PSV cm/sStenosisWaveformComments  +---------------+--------+--------+--------+--------+  Prox to Stent  79              biphasic          +---------------+--------+--------+--------+--------+  Proximal Stent 66              biphasic           +---------------+--------+--------+--------+--------+  Mid Stent      67              biphasic          +---------------+--------+--------+--------+--------+  Distal Stent   83              biphasic          +---------------+--------+--------+--------+--------+  Distal to Stent95              biphasic          +---------------+--------+--------+--------+--------+     Summary:  Right: Patent stent with no evidence of stenosis in the superficial  femoral artery artery.       Assessment/Plan:    88 year old female status post right lower extremity revascularization with known occlusion of her popliteal artery on the left as well with strong posterior tibial signal on the right with brisk capillary refill and delayed capillary refill on the left with a weak peroneal and dorsalis pedis signals.  Given her multiple ongoing symptoms and the fact that swelling is her chief complaint at this time I have recommended follow-up in 4 to 6 weeks for further evaluation.  Ultimately she may require aortogram from the right common femoral approach to revascularize the left popliteal this would be a high risk procedure given her advanced age and medical comorbidities.  I have recommended compression stockings as tolerated as well as elevation of her legs when she is in her chair.  She demonstrates good understanding and I will see her in 4 to 6 weeks.      Maeola Harman MD [facility]

## 2023-05-04 ENCOUNTER — Telehealth: Payer: Self-pay | Admitting: Nurse Practitioner

## 2023-05-04 ENCOUNTER — Telehealth: Payer: Self-pay

## 2023-05-04 ENCOUNTER — Emergency Department (HOSPITAL_COMMUNITY): Payer: Medicare Other

## 2023-05-04 ENCOUNTER — Ambulatory Visit: Payer: Medicare Other | Admitting: Nurse Practitioner

## 2023-05-04 ENCOUNTER — Telehealth: Payer: Self-pay | Admitting: Internal Medicine

## 2023-05-04 ENCOUNTER — Other Ambulatory Visit: Payer: Self-pay

## 2023-05-04 ENCOUNTER — Encounter (HOSPITAL_COMMUNITY): Payer: Self-pay

## 2023-05-04 ENCOUNTER — Inpatient Hospital Stay (HOSPITAL_COMMUNITY)
Admission: EM | Admit: 2023-05-04 | Discharge: 2023-05-08 | DRG: 291 | Disposition: A | Discharge status: Home Health | Payer: Medicare Other | Attending: Internal Medicine | Admitting: Internal Medicine

## 2023-05-04 DIAGNOSIS — Z880 Allergy status to penicillin: Secondary | ICD-10-CM

## 2023-05-04 DIAGNOSIS — L97821 Non-pressure chronic ulcer of other part of left lower leg limited to breakdown of skin: Secondary | ICD-10-CM | POA: Diagnosis not present

## 2023-05-04 DIAGNOSIS — L97819 Non-pressure chronic ulcer of other part of right lower leg with unspecified severity: Secondary | ICD-10-CM | POA: Diagnosis present

## 2023-05-04 DIAGNOSIS — Z8711 Personal history of peptic ulcer disease: Secondary | ICD-10-CM | POA: Diagnosis not present

## 2023-05-04 DIAGNOSIS — I70221 Atherosclerosis of native arteries of extremities with rest pain, right leg: Secondary | ICD-10-CM | POA: Diagnosis present

## 2023-05-04 DIAGNOSIS — I872 Venous insufficiency (chronic) (peripheral): Secondary | ICD-10-CM | POA: Diagnosis not present

## 2023-05-04 DIAGNOSIS — I82411 Acute embolism and thrombosis of right femoral vein: Secondary | ICD-10-CM | POA: Diagnosis not present

## 2023-05-04 DIAGNOSIS — H9193 Unspecified hearing loss, bilateral: Secondary | ICD-10-CM | POA: Diagnosis present

## 2023-05-04 DIAGNOSIS — I110 Hypertensive heart disease with heart failure: Principal | ICD-10-CM | POA: Diagnosis present

## 2023-05-04 DIAGNOSIS — K2970 Gastritis, unspecified, without bleeding: Secondary | ICD-10-CM

## 2023-05-04 DIAGNOSIS — Z86718 Personal history of other venous thrombosis and embolism: Secondary | ICD-10-CM

## 2023-05-04 DIAGNOSIS — I701 Atherosclerosis of renal artery: Secondary | ICD-10-CM | POA: Diagnosis not present

## 2023-05-04 DIAGNOSIS — I5032 Chronic diastolic (congestive) heart failure: Secondary | ICD-10-CM | POA: Diagnosis present

## 2023-05-04 DIAGNOSIS — I5023 Acute on chronic systolic (congestive) heart failure: Secondary | ICD-10-CM | POA: Diagnosis not present

## 2023-05-04 DIAGNOSIS — E1151 Type 2 diabetes mellitus with diabetic peripheral angiopathy without gangrene: Secondary | ICD-10-CM | POA: Diagnosis not present

## 2023-05-04 DIAGNOSIS — K551 Chronic vascular disorders of intestine: Secondary | ICD-10-CM | POA: Diagnosis not present

## 2023-05-04 DIAGNOSIS — Z79899 Other long term (current) drug therapy: Secondary | ICD-10-CM | POA: Diagnosis not present

## 2023-05-04 DIAGNOSIS — L97829 Non-pressure chronic ulcer of other part of left lower leg with unspecified severity: Secondary | ICD-10-CM | POA: Diagnosis present

## 2023-05-04 DIAGNOSIS — I70223 Atherosclerosis of native arteries of extremities with rest pain, bilateral legs: Secondary | ICD-10-CM | POA: Diagnosis not present

## 2023-05-04 DIAGNOSIS — I70202 Unspecified atherosclerosis of native arteries of extremities, left leg: Secondary | ICD-10-CM | POA: Diagnosis present

## 2023-05-04 DIAGNOSIS — S90822D Blister (nonthermal), left foot, subsequent encounter: Secondary | ICD-10-CM | POA: Diagnosis not present

## 2023-05-04 DIAGNOSIS — Z95828 Presence of other vascular implants and grafts: Secondary | ICD-10-CM | POA: Diagnosis not present

## 2023-05-04 DIAGNOSIS — K209 Esophagitis, unspecified without bleeding: Secondary | ICD-10-CM | POA: Diagnosis present

## 2023-05-04 DIAGNOSIS — K449 Diaphragmatic hernia without obstruction or gangrene: Secondary | ICD-10-CM | POA: Diagnosis present

## 2023-05-04 DIAGNOSIS — Z7901 Long term (current) use of anticoagulants: Secondary | ICD-10-CM

## 2023-05-04 DIAGNOSIS — T17908A Unspecified foreign body in respiratory tract, part unspecified causing other injury, initial encounter: Secondary | ICD-10-CM | POA: Diagnosis present

## 2023-05-04 DIAGNOSIS — M7989 Other specified soft tissue disorders: Secondary | ICD-10-CM | POA: Diagnosis not present

## 2023-05-04 DIAGNOSIS — I5020 Unspecified systolic (congestive) heart failure: Secondary | ICD-10-CM | POA: Diagnosis present

## 2023-05-04 DIAGNOSIS — I82432 Acute embolism and thrombosis of left popliteal vein: Secondary | ICD-10-CM | POA: Diagnosis not present

## 2023-05-04 DIAGNOSIS — R0902 Hypoxemia: Secondary | ICD-10-CM | POA: Diagnosis not present

## 2023-05-04 DIAGNOSIS — J9859 Other diseases of mediastinum, not elsewhere classified: Secondary | ICD-10-CM | POA: Diagnosis not present

## 2023-05-04 DIAGNOSIS — I480 Paroxysmal atrial fibrillation: Secondary | ICD-10-CM | POA: Diagnosis not present

## 2023-05-04 DIAGNOSIS — K219 Gastro-esophageal reflux disease without esophagitis: Secondary | ICD-10-CM | POA: Diagnosis present

## 2023-05-04 DIAGNOSIS — E876 Hypokalemia: Secondary | ICD-10-CM | POA: Diagnosis present

## 2023-05-04 DIAGNOSIS — Z8719 Personal history of other diseases of the digestive system: Secondary | ICD-10-CM

## 2023-05-04 DIAGNOSIS — L97811 Non-pressure chronic ulcer of other part of right lower leg limited to breakdown of skin: Secondary | ICD-10-CM | POA: Diagnosis not present

## 2023-05-04 DIAGNOSIS — I5043 Acute on chronic combined systolic (congestive) and diastolic (congestive) heart failure: Secondary | ICD-10-CM | POA: Diagnosis present

## 2023-05-04 DIAGNOSIS — R1310 Dysphagia, unspecified: Secondary | ICD-10-CM | POA: Diagnosis present

## 2023-05-04 DIAGNOSIS — I509 Heart failure, unspecified: Principal | ICD-10-CM

## 2023-05-04 DIAGNOSIS — R600 Localized edema: Secondary | ICD-10-CM

## 2023-05-04 DIAGNOSIS — I4819 Other persistent atrial fibrillation: Secondary | ICD-10-CM | POA: Diagnosis present

## 2023-05-04 DIAGNOSIS — J9601 Acute respiratory failure with hypoxia: Secondary | ICD-10-CM | POA: Diagnosis not present

## 2023-05-04 DIAGNOSIS — E8770 Fluid overload, unspecified: Secondary | ICD-10-CM | POA: Diagnosis not present

## 2023-05-04 DIAGNOSIS — I82521 Chronic embolism and thrombosis of right iliac vein: Secondary | ICD-10-CM | POA: Diagnosis not present

## 2023-05-04 DIAGNOSIS — I739 Peripheral vascular disease, unspecified: Secondary | ICD-10-CM | POA: Diagnosis present

## 2023-05-04 DIAGNOSIS — J9621 Acute and chronic respiratory failure with hypoxia: Secondary | ICD-10-CM | POA: Diagnosis not present

## 2023-05-04 LAB — BASIC METABOLIC PANEL
Anion gap: 13 (ref 5–15)
BUN: 27 mg/dL — ABNORMAL HIGH (ref 8–23)
CO2: 33 mmol/L — ABNORMAL HIGH (ref 22–32)
Calcium: 8.9 mg/dL (ref 8.9–10.3)
Chloride: 85 mmol/L — ABNORMAL LOW (ref 98–111)
Creatinine, Ser: 0.97 mg/dL (ref 0.44–1.00)
GFR, Estimated: 57 mL/min — ABNORMAL LOW (ref >60)
Glucose, Bld: 103 mg/dL — ABNORMAL HIGH (ref 70–99)
Potassium: 3.2 mmol/L — ABNORMAL LOW (ref 3.5–5.1)
Sodium: 131 mmol/L — ABNORMAL LOW (ref 135–145)

## 2023-05-04 LAB — URINALYSIS, COMPLETE (UACMP) WITH MICROSCOPIC
Bacteria, UA: NONE SEEN
Bilirubin Urine: NEGATIVE
Glucose, UA: NEGATIVE mg/dL
Ketones, ur: NEGATIVE mg/dL
Leukocytes,Ua: NEGATIVE
Nitrite: NEGATIVE
Protein, ur: NEGATIVE mg/dL
Specific Gravity, Urine: 1.003 — ABNORMAL LOW (ref 1.005–1.030)
pH: 8 (ref 5.0–8.0)

## 2023-05-04 LAB — CBC
HCT: 32.6 % — ABNORMAL LOW (ref 36.0–46.0)
HCT: 32.2 % — ABNORMAL LOW (ref 36.0–46.0)
Hemoglobin: 10.4 g/dL — ABNORMAL LOW (ref 12.0–15.0)
Hemoglobin: 10.4 g/dL — ABNORMAL LOW (ref 12.0–15.0)
MCH: 29.5 pg (ref 26.0–34.0)
MCH: 29.7 pg (ref 26.0–34.0)
MCHC: 31.9 g/dL (ref 30.0–36.0)
MCHC: 32.3 g/dL (ref 30.0–36.0)
MCV: 92.4 fL (ref 80.0–100.0)
MCV: 92 fL (ref 80.0–100.0)
Platelets: 281 10*3/uL (ref 150–400)
Platelets: 283 10*3/uL (ref 150–400)
RBC: 3.53 MIL/uL — ABNORMAL LOW (ref 3.87–5.11)
RBC: 3.5 MIL/uL — ABNORMAL LOW (ref 3.87–5.11)
RDW: 15 % (ref 11.5–15.5)
RDW: 14.9 % (ref 11.5–15.5)
WBC: 6.8 10*3/uL (ref 4.0–10.5)
WBC: 5.2 10*3/uL (ref 4.0–10.5)
nRBC: 0 % (ref 0.0–0.2)
nRBC: 0 % (ref 0.0–0.2)

## 2023-05-04 LAB — PROTEIN / CREATININE RATIO, URINE
Creatinine, Urine: <10 mg/dL
Total Protein, Urine: 7 mg/dL

## 2023-05-04 LAB — TROPONIN I (HIGH SENSITIVITY)
Troponin I (High Sensitivity): 10 ng/L (ref <18)
Troponin I (High Sensitivity): 9 ng/L (ref <18)

## 2023-05-04 LAB — BRAIN NATRIURETIC PEPTIDE: B Natriuretic Peptide: 1260.5 pg/mL — ABNORMAL HIGH (ref 0.0–100.0)

## 2023-05-04 LAB — CREATININE, SERUM
Creatinine, Ser: 0.96 mg/dL (ref 0.44–1.00)
GFR, Estimated: 58 mL/min — ABNORMAL LOW (ref >60)

## 2023-05-04 MED ORDER — ATORVASTATIN CALCIUM 40 MG PO TABS
40.0000 mg | ORAL_TABLET | Freq: Every day | ORAL | Status: DC
  Administered 2023-05-05 – 2023-05-08 (×4): 40 mg via ORAL
  Filled 2023-05-04 (×4): qty 1

## 2023-05-04 MED ORDER — ACETAMINOPHEN 325 MG PO TABS
650.0000 mg | ORAL_TABLET | Freq: Four times a day (QID) | ORAL | Status: DC | PRN
  Administered 2023-05-08: 650 mg via ORAL
  Filled 2023-05-04: qty 2

## 2023-05-04 MED ORDER — FUROSEMIDE 10 MG/ML IJ SOLN
40.0000 mg | Freq: Once | INTRAMUSCULAR | Status: AC
  Administered 2023-05-04: 40 mg via INTRAVENOUS
  Filled 2023-05-04: qty 4

## 2023-05-04 MED ORDER — ACETAMINOPHEN 650 MG RE SUPP: 650.0000 mg | Freq: Four times a day (QID) | RECTAL | Status: DC | PRN

## 2023-05-04 MED ORDER — POLYETHYLENE GLYCOL 3350 17 G PO PACK: 17.0000 g | PACK | Freq: Every day | ORAL | Status: DC | PRN

## 2023-05-04 MED ORDER — ENOXAPARIN SODIUM 40 MG/0.4ML IJ SOSY: 40.0000 mg | PREFILLED_SYRINGE | INTRAMUSCULAR | Status: DC

## 2023-05-04 MED ORDER — METOPROLOL TARTRATE 50 MG PO TABS
100.0000 mg | ORAL_TABLET | Freq: Two times a day (BID) | ORAL | Status: DC
  Administered 2023-05-04 – 2023-05-08 (×8): 100 mg via ORAL
  Filled 2023-05-04 (×8): qty 2

## 2023-05-04 MED ORDER — FUROSEMIDE 10 MG/ML IJ SOLN
40.0000 mg | Freq: Every day | INTRAMUSCULAR | Status: DC
  Administered 2023-05-05 – 2023-05-06 (×2): 40 mg via INTRAVENOUS
  Filled 2023-05-04 (×2): qty 4

## 2023-05-04 MED ORDER — APIXABAN 5 MG PO TABS
5.0000 mg | ORAL_TABLET | Freq: Two times a day (BID) | ORAL | Status: DC
  Administered 2023-05-04 – 2023-05-08 (×8): 5 mg via ORAL
  Filled 2023-05-04 (×8): qty 1

## 2023-05-04 MED ORDER — POTASSIUM CHLORIDE CRYS ER 20 MEQ PO TBCR
40.0000 meq | EXTENDED_RELEASE_TABLET | Freq: Once | ORAL | Status: AC
  Administered 2023-05-04: 40 meq via ORAL
  Filled 2023-05-04: qty 2

## 2023-05-04 MED ORDER — PANTOPRAZOLE SODIUM 20 MG PO TBEC
20.0000 mg | DELAYED_RELEASE_TABLET | Freq: Every day | ORAL | Status: DC
  Administered 2023-05-05 – 2023-05-07 (×3): 20 mg via ORAL
  Filled 2023-05-04 (×4): qty 1

## 2023-05-04 NOTE — H&P (Addendum)
History and Physical    PatientDemitria Hay WUJ:[id number] DOB: [birth year] DOA: 05/04/2023 DOS: the patient was seen and examined on 05/04/2023 PCP: Ivonne Andrew, NP  Patient coming from: Home  Chief Complaint:  Chief Complaint  Patient presents with   Leg Swelling   HPI: Sharry Beining is a 88 y.o. female with medical history significant of known tendency to have fluid overload/congestive heart failure.  Patient per report takes Lasix as needed and needing it approximately once or twice a week.  However patient has had increasing leg swelling for approximately 10 days, which has persisted besides patient taking Lasix daily for the last 5 days.  Family/home caregiver noticed persistent/worsening leg swelling up to the mid thigh area today and patient was sent to the ER.  There is no report of patient having any chest pain shortness of breath fever or leg edema.  No trauma is reported.  Patient corroborates the history as above.  ER workup is notable for patient having an ambulation trial noted to be tachypneic.  Patient has been placed on oxygen since then for comfort.  Patient otherwise is feeling pretty good.  Medical evaluation is sought Review of Systems: As mentioned in the history of present illness. All other systems reviewed and are negative. Past Medical History:  Diagnosis Date   Hypertension    Peripheral arterial disease (HCC)    Past Surgical History:  Procedure Laterality Date   ABDOMINAL AORTOGRAM W/LOWER EXTREMITY N/A 03/15/2023   Procedure: ABDOMINAL AORTOGRAM W/LOWER EXTREMITY;  Surgeon: Victorino Sparrow, MD;  Location: [facility] INVASIVE CV LAB;  Service: Cardiovascular;  Laterality: N/A;   BIOPSY  03/26/2023   Procedure: BIOPSY;  Surgeon: Benancio Deeds, MD;  Location: [facility] ENDOSCOPY;  Service: Gastroenterology;;   ESOPHAGOGASTRODUODENOSCOPY (EGD) WITH PROPOFOL N/A 03/26/2023   Procedure: ESOPHAGOGASTRODUODENOSCOPY (EGD) WITH PROPOFOL;  Surgeon: Benancio Deeds, MD;   Location: [facility] ENDOSCOPY;  Service: Gastroenterology;  Laterality: N/A;   ORIF FEMUR FRACTURE Right 03/08/2019   Procedure: OPEN REDUCTION INTERNAL FIXATION (ORIF) DISTAL FEMUR FRACTURE;  Surgeon: Kathryne Hitch, MD;  Location: [facility] ORS;  Service: Orthopedics;  Laterality: Right;   PERIPHERAL VASCULAR INTERVENTION Right 03/16/2023   Procedure: PERIPHERAL VASCULAR INTERVENTION;  Surgeon: Cephus Shelling, MD;  Location: [facility] INVASIVE CV LAB;  Service: Cardiovascular;  Laterality: Right;  SFA   PERIPHERAL VASCULAR THROMBECTOMY N/A 03/16/2023   Procedure: LYSIS RECHECK;  Surgeon: Cephus Shelling, MD;  Location: [facility] INVASIVE CV LAB;  Service: Cardiovascular;  Laterality: N/A;   Social History:  reports that she has never smoked. She has never used smokeless tobacco. She reports that she does not currently use alcohol. She reports that she does not use drugs.  Allergies  Allergen Reactions   Penicillin G Hives    History reviewed. No pertinent family history.  Prior to Admission medications   Medication Sig Start Date End Date Taking? Authorizing Provider  amiodarone (PACERONE) 200 MG tablet Take 1 tablet (200 mg total) by mouth 2 (two) times daily. 04/26/23   Ivonne Andrew, NP  apixaban (ELIQUIS) 5 MG TABS tablet Take 1 tablet (5 mg total) by mouth 2 (two) times daily. 04/26/23   Ivonne Andrew, NP  atorvastatin (LIPITOR) 40 MG tablet Take 1 tablet (40 mg total) by mouth daily. 04/26/23   Ivonne Andrew, NP  furosemide (LASIX) 20 MG tablet Take 1 tablet (20 mg total) by mouth daily. For 5 days . 04/28/23   Ivonne Andrew, NP  metoprolol tartrate (  History and Physical    PatientDemitria Hay WUJ:[id number] DOB: [birth year] DOA: 05/04/2023 DOS: the patient was seen and examined on 05/04/2023 PCP: Ivonne Andrew, NP  Patient coming from: Home  Chief Complaint:  Chief Complaint  Patient presents with   Leg Swelling   HPI: Sharry Beining is a 88 y.o. female with medical history significant of known tendency to have fluid overload/congestive heart failure.  Patient per report takes Lasix as needed and needing it approximately once or twice a week.  However patient has had increasing leg swelling for approximately 10 days, which has persisted besides patient taking Lasix daily for the last 5 days.  Family/home caregiver noticed persistent/worsening leg swelling up to the mid thigh area today and patient was sent to the ER.  There is no report of patient having any chest pain shortness of breath fever or leg edema.  No trauma is reported.  Patient corroborates the history as above.  ER workup is notable for patient having an ambulation trial noted to be tachypneic.  Patient has been placed on oxygen since then for comfort.  Patient otherwise is feeling pretty good.  Medical evaluation is sought Review of Systems: As mentioned in the history of present illness. All other systems reviewed and are negative. Past Medical History:  Diagnosis Date   Hypertension    Peripheral arterial disease (HCC)    Past Surgical History:  Procedure Laterality Date   ABDOMINAL AORTOGRAM W/LOWER EXTREMITY N/A 03/15/2023   Procedure: ABDOMINAL AORTOGRAM W/LOWER EXTREMITY;  Surgeon: Victorino Sparrow, MD;  Location: [facility] INVASIVE CV LAB;  Service: Cardiovascular;  Laterality: N/A;   BIOPSY  03/26/2023   Procedure: BIOPSY;  Surgeon: Benancio Deeds, MD;  Location: [facility] ENDOSCOPY;  Service: Gastroenterology;;   ESOPHAGOGASTRODUODENOSCOPY (EGD) WITH PROPOFOL N/A 03/26/2023   Procedure: ESOPHAGOGASTRODUODENOSCOPY (EGD) WITH PROPOFOL;  Surgeon: Benancio Deeds, MD;   Location: [facility] ENDOSCOPY;  Service: Gastroenterology;  Laterality: N/A;   ORIF FEMUR FRACTURE Right 03/08/2019   Procedure: OPEN REDUCTION INTERNAL FIXATION (ORIF) DISTAL FEMUR FRACTURE;  Surgeon: Kathryne Hitch, MD;  Location: [facility]S;  Service: Orthopedics;  Laterality: Right;   PERIPHERAL VASCULAR INTERVENTION Right 03/16/2023   Procedure: PERIPHERAL VASCULAR INTERVENTION;  Surgeon: Cephus Shelling, MD;  Location: [facility] INVASIVE CV LAB;  Service: Cardiovascular;  Laterality: Right;  SFA   PERIPHERAL VASCULAR THROMBECTOMY N/A 03/16/2023   Procedure: LYSIS RECHECK;  Surgeon: Cephus Shelling, MD;  Location: [facility] INVASIVE CV LAB;  Service: Cardiovascular;  Laterality: N/A;   Social History:  reports that she has never smoked. She has never used smokeless tobacco. She reports that she does not currently use alcohol. She reports that she does not use drugs.  Allergies  Allergen Reactions   Penicillin G Hives    History reviewed. No pertinent family history.  Prior to Admission medications   Medication Sig Start Date End Date Taking? Authorizing Provider  amiodarone (PACERONE) 200 MG tablet Take 1 tablet (200 mg total) by mouth 2 (two) times daily. 04/26/23   Ivonne Andrew, NP  apixaban (ELIQUIS) 5 MG TABS tablet Take 1 tablet (5 mg total) by mouth 2 (two) times daily. 04/26/23   Ivonne Andrew, NP  atorvastatin (LIPITOR) 40 MG tablet Take 1 tablet (40 mg total) by mouth daily. 04/26/23   Ivonne Andrew, NP  furosemide (LASIX) 20 MG tablet Take 1 tablet (20 mg total) by mouth daily. For 5 days . 04/28/23   Ivonne Andrew, NP  metoprolol tartrate (  History and Physical    PatientDemitria Hay WUJ:[id number] DOB: [birth year] DOA: 05/04/2023 DOS: the patient was seen and examined on 05/04/2023 PCP: Ivonne Andrew, NP  Patient coming from: Home  Chief Complaint:  Chief Complaint  Patient presents with   Leg Swelling   HPI: Sharry Beining is a 88 y.o. female with medical history significant of known tendency to have fluid overload/congestive heart failure.  Patient per report takes Lasix as needed and needing it approximately once or twice a week.  However patient has had increasing leg swelling for approximately 10 days, which has persisted besides patient taking Lasix daily for the last 5 days.  Family/home caregiver noticed persistent/worsening leg swelling up to the mid thigh area today and patient was sent to the ER.  There is no report of patient having any chest pain shortness of breath fever or leg edema.  No trauma is reported.  Patient corroborates the history as above.  ER workup is notable for patient having an ambulation trial noted to be tachypneic.  Patient has been placed on oxygen since then for comfort.  Patient otherwise is feeling pretty good.  Medical evaluation is sought Review of Systems: As mentioned in the history of present illness. All other systems reviewed and are negative. Past Medical History:  Diagnosis Date   Hypertension    Peripheral arterial disease (HCC)    Past Surgical History:  Procedure Laterality Date   ABDOMINAL AORTOGRAM W/LOWER EXTREMITY N/A 03/15/2023   Procedure: ABDOMINAL AORTOGRAM W/LOWER EXTREMITY;  Surgeon: Victorino Sparrow, MD;  Location: [facility] INVASIVE CV LAB;  Service: Cardiovascular;  Laterality: N/A;   BIOPSY  03/26/2023   Procedure: BIOPSY;  Surgeon: Benancio Deeds, MD;  Location: [facility] ENDOSCOPY;  Service: Gastroenterology;;   ESOPHAGOGASTRODUODENOSCOPY (EGD) WITH PROPOFOL N/A 03/26/2023   Procedure: ESOPHAGOGASTRODUODENOSCOPY (EGD) WITH PROPOFOL;  Surgeon: Benancio Deeds, MD;   Location: [facility] ENDOSCOPY;  Service: Gastroenterology;  Laterality: N/A;   ORIF FEMUR FRACTURE Right 03/08/2019   Procedure: OPEN REDUCTION INTERNAL FIXATION (ORIF) DISTAL FEMUR FRACTURE;  Surgeon: Kathryne Hitch, MD;  Location: [facility];  Service: Orthopedics;  Laterality: Right;   PERIPHERAL VASCULAR INTERVENTION Right 03/16/2023   Procedure: PERIPHERAL VASCULAR INTERVENTION;  Surgeon: Cephus Shelling, MD;  Location: [facility] INVASIVE CV LAB;  Service: Cardiovascular;  Laterality: Right;  SFA   PERIPHERAL VASCULAR THROMBECTOMY N/A 03/16/2023   Procedure: LYSIS RECHECK;  Surgeon: Cephus Shelling, MD;  Location: [facility] INVASIVE CV LAB;  Service: Cardiovascular;  Laterality: N/A;   Social History:  reports that she has never smoked. She has never used smokeless tobacco. She reports that she does not currently use alcohol. She reports that she does not use drugs.  Allergies  Allergen Reactions   Penicillin G Hives    History reviewed. No pertinent family history.  Prior to Admission medications   Medication Sig Start Date End Date Taking? Authorizing Provider  amiodarone (PACERONE) 200 MG tablet Take 1 tablet (200 mg total) by mouth 2 (two) times daily. 04/26/23   Ivonne Andrew, NP  apixaban (ELIQUIS) 5 MG TABS tablet Take 1 tablet (5 mg total) by mouth 2 (two) times daily. 04/26/23   Ivonne Andrew, NP  atorvastatin (LIPITOR) 40 MG tablet Take 1 tablet (40 mg total) by mouth daily. 04/26/23   Ivonne Andrew, NP  furosemide (LASIX) 20 MG tablet Take 1 tablet (20 mg total) by mouth daily. For 5 days . 04/28/23   Ivonne Andrew, NP  metoprolol tartrate (  extending into the distal thigh. Complete occlusion of the                   left popliteal artery beginning at the P3 segment and                   extending through the trifurcation. " Performing Technologist: Gertie Fey MHA, RVT, RDCS, RDMS  Examination Guidelines: A complete evaluation includes at minimum, Doppler waveform signals and systolic blood pressure reading at the level of bilateral brachial, anterior tibial, and posterior tibial arteries, when vessel segments are accessible. Bilateral testing is  considered an integral part of a complete examination. Photoelectric Plethysmograph (PPG) waveforms and toe systolic pressure readings are included as required and additional duplex testing as needed. Limited examinations for reoccurring indications may be performed as noted.  ABI Findings: +---------+------------------+-----+--------+--------+ Right    Rt Pressure (mmHg)IndexWaveformComment  +---------+------------------+-----+--------+--------+ Brachial 137                                     +---------+------------------+-----+--------+--------+ PTA      149               1.09 biphasic         +---------+------------------+-----+--------+--------+ DP                              absent           +---------+------------------+-----+--------+--------+ Great Toe                       Absent           +---------+------------------+-----+--------+--------+ +--------+------------------+-----+--------+-------------+ Left    Lt Pressure (mmHg)IndexWaveformComment       +--------+------------------+-----+--------+-------------+ Brachial                               Not evaluated +--------+------------------+-----+--------+-------------+ PTA                                    Not evaluated +--------+------------------+-----+--------+-------------+ DP                                     Not evaluated +--------+------------------+-----+--------+-------------+ +-------+-------------+-------------+------------+------------+ ABI/TBIToday's ABI  Today's TBI  Previous ABIPrevious TBI +-------+-------------+-------------+------------+------------+ Right  1.09         0.00         0.00        0.00         +-------+-------------+-------------+------------+------------+ Left   Not evaluatedNot evaluated0.56        0.15         +-------+-------------+-------------+------------+------------+   Summary: Right: Resting right ankle-brachial index is within normal  range. The right toe-brachial index is abnormal. *See table(s) above for measurements and observations.  Electronically signed by Lemar Livings MD on 05/03/2023 at 10:56:56 AM.    Final    VAS [place] LOWER EXTREMITY ARTERIAL DUPLEX  Result Date: 05/03/2023 LOWER EXTREMITY ARTERIAL DUPLEX STUDY Patient Name:  ANALAYAH BROOKE  Date of Exam:   05/03/2023 Medical Rec #: [id number]   Accession #:    [id number] Date of Birth: 02/10/1935   Patient Gender: F Patient Age:  History and Physical    PatientDemitria Hay WUJ:[id number] DOB: [birth year] DOA: 05/04/2023 DOS: the patient was seen and examined on 05/04/2023 PCP: Ivonne Andrew, NP  Patient coming from: Home  Chief Complaint:  Chief Complaint  Patient presents with   Leg Swelling   HPI: Sharry Beining is a 88 y.o. female with medical history significant of known tendency to have fluid overload/congestive heart failure.  Patient per report takes Lasix as needed and needing it approximately once or twice a week.  However patient has had increasing leg swelling for approximately 10 days, which has persisted besides patient taking Lasix daily for the last 5 days.  Family/home caregiver noticed persistent/worsening leg swelling up to the mid thigh area today and patient was sent to the ER.  There is no report of patient having any chest pain shortness of breath fever or leg edema.  No trauma is reported.  Patient corroborates the history as above.  ER workup is notable for patient having an ambulation trial noted to be tachypneic.  Patient has been placed on oxygen since then for comfort.  Patient otherwise is feeling pretty good.  Medical evaluation is sought Review of Systems: As mentioned in the history of present illness. All other systems reviewed and are negative. Past Medical History:  Diagnosis Date   Hypertension    Peripheral arterial disease (HCC)    Past Surgical History:  Procedure Laterality Date   ABDOMINAL AORTOGRAM W/LOWER EXTREMITY N/A 03/15/2023   Procedure: ABDOMINAL AORTOGRAM W/LOWER EXTREMITY;  Surgeon: Victorino Sparrow, MD;  Location: [facility] INVASIVE CV LAB;  Service: Cardiovascular;  Laterality: N/A;   BIOPSY  03/26/2023   Procedure: BIOPSY;  Surgeon: Benancio Deeds, MD;  Location: [facility] ENDOSCOPY;  Service: Gastroenterology;;   ESOPHAGOGASTRODUODENOSCOPY (EGD) WITH PROPOFOL N/A 03/26/2023   Procedure: ESOPHAGOGASTRODUODENOSCOPY (EGD) WITH PROPOFOL;  Surgeon: Benancio Deeds, MD;   Location: [facility] ENDOSCOPY;  Service: Gastroenterology;  Laterality: N/A;   ORIF FEMUR FRACTURE Right 03/08/2019   Procedure: OPEN REDUCTION INTERNAL FIXATION (ORIF) DISTAL FEMUR FRACTURE;  Surgeon: Kathryne Hitch, MD;  Location: [facility];  Service: Orthopedics;  Laterality: Right;   PERIPHERAL VASCULAR INTERVENTION Right 03/16/2023   Procedure: PERIPHERAL VASCULAR INTERVENTION;  Surgeon: Cephus Shelling, MD;  Location: [facility] INVASIVE CV LAB;  Service: Cardiovascular;  Laterality: Right;  SFA   PERIPHERAL VASCULAR THROMBECTOMY N/A 03/16/2023   Procedure: LYSIS RECHECK;  Surgeon: Cephus Shelling, MD;  Location: [facility] INVASIVE CV LAB;  Service: Cardiovascular;  Laterality: N/A;   Social History:  reports that she has never smoked. She has never used smokeless tobacco. She reports that she does not currently use alcohol. She reports that she does not use drugs.  Allergies  Allergen Reactions   Penicillin G Hives    History reviewed. No pertinent family history.  Prior to Admission medications   Medication Sig Start Date End Date Taking? Authorizing Provider  amiodarone (PACERONE) 200 MG tablet Take 1 tablet (200 mg total) by mouth 2 (two) times daily. 04/26/23   Ivonne Andrew, NP  apixaban (ELIQUIS) 5 MG TABS tablet Take 1 tablet (5 mg total) by mouth 2 (two) times daily. 04/26/23   Ivonne Andrew, NP  atorvastatin (LIPITOR) 40 MG tablet Take 1 tablet (40 mg total) by mouth daily. 04/26/23   Ivonne Andrew, NP  furosemide (LASIX) 20 MG tablet Take 1 tablet (20 mg total) by mouth daily. For 5 days . 04/28/23   Ivonne Andrew, NP  metoprolol tartrate (  extending into the distal thigh. Complete occlusion of the                   left popliteal artery beginning at the P3 segment and                   extending through the trifurcation. " Performing Technologist: Gertie Fey MHA, RVT, RDCS, RDMS  Examination Guidelines: A complete evaluation includes at minimum, Doppler waveform signals and systolic blood pressure reading at the level of bilateral brachial, anterior tibial, and posterior tibial arteries, when vessel segments are accessible. Bilateral testing is  considered an integral part of a complete examination. Photoelectric Plethysmograph (PPG) waveforms and toe systolic pressure readings are included as required and additional duplex testing as needed. Limited examinations for reoccurring indications may be performed as noted.  ABI Findings: +---------+------------------+-----+--------+--------+ Right    Rt Pressure (mmHg)IndexWaveformComment  +---------+------------------+-----+--------+--------+ Brachial 137                                     +---------+------------------+-----+--------+--------+ PTA      149               1.09 biphasic         +---------+------------------+-----+--------+--------+ DP                              absent           +---------+------------------+-----+--------+--------+ Great Toe                       Absent           +---------+------------------+-----+--------+--------+ +--------+------------------+-----+--------+-------------+ Left    Lt Pressure (mmHg)IndexWaveformComment       +--------+------------------+-----+--------+-------------+ Brachial                               Not evaluated +--------+------------------+-----+--------+-------------+ PTA                                    Not evaluated +--------+------------------+-----+--------+-------------+ DP                                     Not evaluated +--------+------------------+-----+--------+-------------+ +-------+-------------+-------------+------------+------------+ ABI/TBIToday's ABI  Today's TBI  Previous ABIPrevious TBI +-------+-------------+-------------+------------+------------+ Right  1.09         0.00         0.00        0.00         +-------+-------------+-------------+------------+------------+ Left   Not evaluatedNot evaluated0.56        0.15         +-------+-------------+-------------+------------+------------+   Summary: Right: Resting right ankle-brachial index is within normal  range. The right toe-brachial index is abnormal. *See table(s) above for measurements and observations.  Electronically signed by Lemar Livings MD on 05/03/2023 at 10:56:56 AM.    Final    VAS [place] LOWER EXTREMITY ARTERIAL DUPLEX  Result Date: 05/03/2023 LOWER EXTREMITY ARTERIAL DUPLEX STUDY Patient Name:  ANALAYAH BROOKE  Date of Exam:   05/03/2023 Medical Rec #: [id number]   Accession #:    [id number] Date of Birth: 02/10/1935   Patient Gender: F Patient Age:  extending into the distal thigh. Complete occlusion of the                   left popliteal artery beginning at the P3 segment and                   extending through the trifurcation. " Performing Technologist: Gertie Fey MHA, RVT, RDCS, RDMS  Examination Guidelines: A complete evaluation includes at minimum, Doppler waveform signals and systolic blood pressure reading at the level of bilateral brachial, anterior tibial, and posterior tibial arteries, when vessel segments are accessible. Bilateral testing is  considered an integral part of a complete examination. Photoelectric Plethysmograph (PPG) waveforms and toe systolic pressure readings are included as required and additional duplex testing as needed. Limited examinations for reoccurring indications may be performed as noted.  ABI Findings: +---------+------------------+-----+--------+--------+ Right    Rt Pressure (mmHg)IndexWaveformComment  +---------+------------------+-----+--------+--------+ Brachial 137                                     +---------+------------------+-----+--------+--------+ PTA      149               1.09 biphasic         +---------+------------------+-----+--------+--------+ DP                              absent           +---------+------------------+-----+--------+--------+ Great Toe                       Absent           +---------+------------------+-----+--------+--------+ +--------+------------------+-----+--------+-------------+ Left    Lt Pressure (mmHg)IndexWaveformComment       +--------+------------------+-----+--------+-------------+ Brachial                               Not evaluated +--------+------------------+-----+--------+-------------+ PTA                                    Not evaluated +--------+------------------+-----+--------+-------------+ DP                                     Not evaluated +--------+------------------+-----+--------+-------------+ +-------+-------------+-------------+------------+------------+ ABI/TBIToday's ABI  Today's TBI  Previous ABIPrevious TBI +-------+-------------+-------------+------------+------------+ Right  1.09         0.00         0.00        0.00         +-------+-------------+-------------+------------+------------+ Left   Not evaluatedNot evaluated0.56        0.15         +-------+-------------+-------------+------------+------------+   Summary: Right: Resting right ankle-brachial index is within normal  range. The right toe-brachial index is abnormal. *See table(s) above for measurements and observations.  Electronically signed by Lemar Livings MD on 05/03/2023 at 10:56:56 AM.    Final    VAS [place] LOWER EXTREMITY ARTERIAL DUPLEX  Result Date: 05/03/2023 LOWER EXTREMITY ARTERIAL DUPLEX STUDY Patient Name:  ANALAYAH BROOKE  Date of Exam:   05/03/2023 Medical Rec #: [id number]   Accession #:    [id number] Date of Birth: 02/10/1935   Patient Gender: F Patient Age:  Collection Time: 05/04/23  3:11 PM  Result Value Ref Range   Troponin I (High Sensitivity) 9 <18 ng/L   Basic Metabolic Panel: Recent Labs  Lab 05/04/23 1332  NA 131*  K 3.2*  CL 85*  CO2 33*  GLUCOSE 103*  BUN 27*  CREATININE 0.97  CALCIUM 8.9   Liver Function Tests: No results for input(s): "AST", "ALT", "ALKPHOS", "BILITOT", "PROT", "ALBUMIN" in the last 168 hours. No results for input(s): "LIPASE", "AMYLASE" in the last 168 hours. No results for input(s): "AMMONIA" in the last 168 hours. CBC: Recent Labs  Lab 05/04/23 1332  WBC 6.8  HGB 10.4*  HCT 32.6*  MCV 92.4  PLT 281   Cardiac Enzymes: Recent Labs  Lab 05/04/23 1332 05/04/23 1511  TROPONINIHS 10 9    BNP (last 3 results) No results for input(s): "PROBNP" in the last 8760 hours. CBG: No results for input(s): "GLUCAP" in the last 168 hours.  Radiological Exams on Admission:  DG Chest 2 View  Result Date: 05/04/2023 CLINICAL DATA:  Peripheral edema evaluation EXAM: CHEST - 2 VIEW COMPARISON:  Radiograph 03/26/2023 FINDINGS: Low lung volumes. Stable cardiomediastinal silhouette. Aortic atherosclerotic calcification. Elevated left hemidiaphragm. Small bilateral pleural effusions and associated airspace opacities similar to prior. Pulmonary vascular congestion. IMPRESSION: Small bilateral pleural effusions and associated airspace opacities similar to prior. Electronically Signed   By: Minerva Fester M.D.   On: 05/04/2023 16:26   VAS [place] ABI WITH/WO TBI  Result Date: 05/03/2023  LOWER EXTREMITY DOPPLER STUDY Patient Name:  TASHEENA WAMBOLT  Date of Exam:   05/03/2023 Medical Rec #: [id number]   Accession #:    [id number] Date of Birth: 26-Apr-1935   Patient Gender: F Patient Age:   78 years Exam Location:  [facility] Procedure:      VAS [place] ABI WITH/WO TBI Referring Phys: Lemar Livings --------------------------------------------------------------------------------  Indications: Peripheral artery disease. High Risk Factors: Hyperlipidemia.  Vascular Interventions: 03/15/23 thrombolysis right lower extremity                         03/16/23 right SFA stenting with angioplasty x2 (6 mm x 5                         cm Viabahn and 6 mm x 40 drug-coated Eluvia), right                         lower extremity arteriogram. Limitations: Today's exam was limited due to extensive weeping and bandaging              bilateral calves to feet, blisters on left lower extremity and              foot. Left lower extremity not evaluated secondary to these              limitations. Comparison Study: 03/14/23 ABI/TBI- right=0.00/0.00, left=0.56/0.15                    CTA aorta bifemoral performed 03/13/23 for finding of SFA                   occlusion on venous ultrasound. " Occlusion right superficial                   femoral artery beginning several cm beyond the origin in

## 2023-05-04 NOTE — Telephone Encounter (Signed)
PT will have to cancel OV today because she has been hospitalized at [facility].

## 2023-05-04 NOTE — Telephone Encounter (Signed)
I spoke with [name] with [facility] and he says he saw the pt this past Tuesday and she weight 154 and today her weight is 158.. but she appears to be very puffy... fluid seems to be all over her body upper and lower and she is very weak and SOB... he says she appears unwell and he plans to call EMS to take her to the ED for immediate assessment.

## 2023-05-04 NOTE — Telephone Encounter (Signed)
Look like pt is at the hospital now.  KH

## 2023-05-04 NOTE — ED Notes (Signed)
Not evaluated +--------+------------------+-----+--------+-------------+ +-------+-------------+-------------+------------+------------+ ABI/TBIToday's ABI  Today's TBI  Previous ABIPrevious TBI +-------+-------------+-------------+------------+------------+ Right  1.09         0.00         0.00        0.00         +-------+-------------+-------------+------------+------------+ Left   Not evaluatedNot evaluated0.56        0.15         +-------+-------------+-------------+------------+------------+   Summary: Right: Resting right ankle-brachial index is within normal range. The right toe-brachial index is abnormal. *See table(s) above for measurements and observations.  Electronically signed by Lemar Livings MD on 05/03/2023 at 10:56:56 AM.    Final    VAS [place] LOWER EXTREMITY ARTERIAL DUPLEX  Result Date: 05/03/2023 LOWER EXTREMITY ARTERIAL DUPLEX STUDY Patient Name:  Sarah Wolf  Date of Exam:   05/03/2023 Medical Rec #: [id number]   Accession #:    [id number]  Date of Birth: 05-14-1935   Patient Gender: F Patient Age:   88 years Exam Location:  [facility] Procedure:      VAS [place] LOWER EXTREMITY ARTERIAL DUPLEX Referring Phys: Lemar Livings --------------------------------------------------------------------------------  Indications: Peripheral artery disease. High Risk Factors: Hyperlipidemia.  Vascular Interventions: 03/15/23 thrombolysis right lower extremity                         03/16/23 right SFA stenting with angioplasty x2 (6 mm x 5                         cm Viabahn and 6 mm x 40 drug-coated Eluvia), right                         lower extremity arteriogram. Current ABI:            right=1.09/0.00, left= not evaluated secondary to                         limitations Comparison Study: 03/14/23 Lower extremity arterial duplex- Right: Total                   occlusion noted in the superficial femoral artery.                   Reconstitution of the distal SFA via collateral. Dampened flow                   noted in the posterior tibial and anterior tibial ateries.                   Unable to detect flow in the peroneal artery.                    Left: Total occlusion noted in the popliteal artery.                   Reconstitution of the proximal PTA via collateral. Monophasic                   flow noted throughout the anterior tibial and peroneal                   arteries. Performing Technologist: Gertie Fey MHA, RDMS, RVT, RDCS  Examination Guidelines: A complete evaluation includes B-mode imaging, spectral Doppler, color Doppler, and power Doppler as needed of all accessible portions of each vessel.  Not evaluated +--------+------------------+-----+--------+-------------+ +-------+-------------+-------------+------------+------------+ ABI/TBIToday's ABI  Today's TBI  Previous ABIPrevious TBI +-------+-------------+-------------+------------+------------+ Right  1.09         0.00         0.00        0.00         +-------+-------------+-------------+------------+------------+ Left   Not evaluatedNot evaluated0.56        0.15         +-------+-------------+-------------+------------+------------+   Summary: Right: Resting right ankle-brachial index is within normal range. The right toe-brachial index is abnormal. *See table(s) above for measurements and observations.  Electronically signed by Lemar Livings MD on 05/03/2023 at 10:56:56 AM.    Final    VAS [place] LOWER EXTREMITY ARTERIAL DUPLEX  Result Date: 05/03/2023 LOWER EXTREMITY ARTERIAL DUPLEX STUDY Patient Name:  Sarah Wolf  Date of Exam:   05/03/2023 Medical Rec #: [id number]   Accession #:    [id number]  Date of Birth: 05-14-1935   Patient Gender: F Patient Age:   88 years Exam Location:  [facility] Procedure:      VAS [place] LOWER EXTREMITY ARTERIAL DUPLEX Referring Phys: Lemar Livings --------------------------------------------------------------------------------  Indications: Peripheral artery disease. High Risk Factors: Hyperlipidemia.  Vascular Interventions: 03/15/23 thrombolysis right lower extremity                         03/16/23 right SFA stenting with angioplasty x2 (6 mm x 5                         cm Viabahn and 6 mm x 40 drug-coated Eluvia), right                         lower extremity arteriogram. Current ABI:            right=1.09/0.00, left= not evaluated secondary to                         limitations Comparison Study: 03/14/23 Lower extremity arterial duplex- Right: Total                   occlusion noted in the superficial femoral artery.                   Reconstitution of the distal SFA via collateral. Dampened flow                   noted in the posterior tibial and anterior tibial ateries.                   Unable to detect flow in the peroneal artery.                    Left: Total occlusion noted in the popliteal artery.                   Reconstitution of the proximal PTA via collateral. Monophasic                   flow noted throughout the anterior tibial and peroneal                   arteries. Performing Technologist: Gertie Fey MHA, RDMS, RVT, RDCS  Examination Guidelines: A complete evaluation includes B-mode imaging, spectral Doppler, color Doppler, and power Doppler as needed of all accessible portions of each vessel.  Not evaluated +--------+------------------+-----+--------+-------------+ +-------+-------------+-------------+------------+------------+ ABI/TBIToday's ABI  Today's TBI  Previous ABIPrevious TBI +-------+-------------+-------------+------------+------------+ Right  1.09         0.00         0.00        0.00         +-------+-------------+-------------+------------+------------+ Left   Not evaluatedNot evaluated0.56        0.15         +-------+-------------+-------------+------------+------------+   Summary: Right: Resting right ankle-brachial index is within normal range. The right toe-brachial index is abnormal. *See table(s) above for measurements and observations.  Electronically signed by Lemar Livings MD on 05/03/2023 at 10:56:56 AM.    Final    VAS [place] LOWER EXTREMITY ARTERIAL DUPLEX  Result Date: 05/03/2023 LOWER EXTREMITY ARTERIAL DUPLEX STUDY Patient Name:  Sarah Wolf  Date of Exam:   05/03/2023 Medical Rec #: [id number]   Accession #:    [id number]  Date of Birth: 05-14-1935   Patient Gender: F Patient Age:   88 years Exam Location:  [facility] Procedure:      VAS [place] LOWER EXTREMITY ARTERIAL DUPLEX Referring Phys: Lemar Livings --------------------------------------------------------------------------------  Indications: Peripheral artery disease. High Risk Factors: Hyperlipidemia.  Vascular Interventions: 03/15/23 thrombolysis right lower extremity                         03/16/23 right SFA stenting with angioplasty x2 (6 mm x 5                         cm Viabahn and 6 mm x 40 drug-coated Eluvia), right                         lower extremity arteriogram. Current ABI:            right=1.09/0.00, left= not evaluated secondary to                         limitations Comparison Study: 03/14/23 Lower extremity arterial duplex- Right: Total                   occlusion noted in the superficial femoral artery.                   Reconstitution of the distal SFA via collateral. Dampened flow                   noted in the posterior tibial and anterior tibial ateries.                   Unable to detect flow in the peroneal artery.                    Left: Total occlusion noted in the popliteal artery.                   Reconstitution of the proximal PTA via collateral. Monophasic                   flow noted throughout the anterior tibial and peroneal                   arteries. Performing Technologist: Gertie Fey MHA, RDMS, RVT, RDCS  Examination Guidelines: A complete evaluation includes B-mode imaging, spectral Doppler, color Doppler, and power Doppler as needed of all accessible portions of each vessel.  H) 70 - 99 mg/dL    Comment: Glucose reference range applies only to samples taken after fasting for at least 8 hours.   BUN 27 (H) 8 - 23 mg/dL   Creatinine, Ser 1.61 0.44 - 1.00 mg/dL   Calcium 8.9 8.9 - 09.6 mg/dL   GFR, Estimated 57 (L) >60 mL/min    Comment: (NOTE) Calculated using the CKD-EPI Creatinine Equation (2021)    Anion gap 13 5 - 15    Comment: Performed at [facility], [address] [place], [place] [address]  Brain natriuretic peptide     Status: Abnormal   Collection Time: 05/04/23  1:32 PM  Result Value Ref Range   B Natriuretic Peptide 1,260.5 (H) 0.0 - 100.0 pg/mL    Comment: Performed at [facility], [address], [place], [place] [address]  Troponin I (High Sensitivity)     Status: None   Collection Time: 05/04/23  1:32 PM  Result Value Ref Range   Troponin I (High Sensitivity) 10 <18 ng/L    Comment:  (NOTE) Elevated high sensitivity troponin I (hsTnI) values and significant  changes across serial measurements may suggest ACS but many other  chronic and acute conditions are known to elevate hsTnI results.  Refer to the "Links" section for chest pain algorithms and additional  guidance. Performed at [facility], [address], [place], [place] [address]   Troponin I (High Sensitivity)     Status: None   Collection Time: 05/04/23  3:11 PM  Result Value Ref Range   Troponin I (High Sensitivity) 9 <18 ng/L    Comment: (NOTE) Elevated high sensitivity troponin I (hsTnI) values and significant  changes across serial measurements may suggest ACS but many other  chronic and acute conditions are known to elevate hsTnI results.  Refer to the "Links" section for chest pain algorithms and additional  guidance. Performed at [facility], [address], [place], [place] [address]    DG Chest 2 View  Result Date: 05/04/2023 CLINICAL DATA:  Peripheral edema evaluation EXAM: CHEST - 2 VIEW COMPARISON:  Radiograph 03/26/2023 FINDINGS: Low lung volumes. Stable cardiomediastinal silhouette. Aortic atherosclerotic calcification. Elevated left hemidiaphragm. Small bilateral pleural effusions and associated airspace opacities similar to prior. Pulmonary vascular congestion. IMPRESSION: Small bilateral pleural effusions and associated airspace opacities similar to prior. Electronically Signed   By: Minerva Fester M.D.   On: 05/04/2023 16:26   VAS [place] ABI WITH/WO TBI  Result Date: 05/03/2023  LOWER EXTREMITY DOPPLER STUDY Patient Name:  Sarah Wolf  Date of Exam:   05/03/2023 Medical Rec #: [id number]   Accession #:    [id number] Date of Birth: 11-10-1934   Patient Gender: F Patient Age:   52 years Exam Location:  [facility] Procedure:      VAS [place] ABI WITH/WO TBI Referring Phys: Lemar Livings  --------------------------------------------------------------------------------  Indications: Peripheral artery disease. High Risk Factors: Hyperlipidemia.  Vascular Interventions: 03/15/23 thrombolysis right lower extremity                         03/16/23 right SFA stenting with angioplasty x2 (6 mm x 5                         cm Viabahn and 6 mm x 40 drug-coated Eluvia), right  H) 70 - 99 mg/dL    Comment: Glucose reference range applies only to samples taken after fasting for at least 8 hours.   BUN 27 (H) 8 - 23 mg/dL   Creatinine, Ser 1.61 0.44 - 1.00 mg/dL   Calcium 8.9 8.9 - 09.6 mg/dL   GFR, Estimated 57 (L) >60 mL/min    Comment: (NOTE) Calculated using the CKD-EPI Creatinine Equation (2021)    Anion gap 13 5 - 15    Comment: Performed at [facility], [address] [place], [place] [address]  Brain natriuretic peptide     Status: Abnormal   Collection Time: 05/04/23  1:32 PM  Result Value Ref Range   B Natriuretic Peptide 1,260.5 (H) 0.0 - 100.0 pg/mL    Comment: Performed at [facility], [address], [place], [place] [address]  Troponin I (High Sensitivity)     Status: None   Collection Time: 05/04/23  1:32 PM  Result Value Ref Range   Troponin I (High Sensitivity) 10 <18 ng/L    Comment:  (NOTE) Elevated high sensitivity troponin I (hsTnI) values and significant  changes across serial measurements may suggest ACS but many other  chronic and acute conditions are known to elevate hsTnI results.  Refer to the "Links" section for chest pain algorithms and additional  guidance. Performed at [facility], [address], [place], [place] [address]   Troponin I (High Sensitivity)     Status: None   Collection Time: 05/04/23  3:11 PM  Result Value Ref Range   Troponin I (High Sensitivity) 9 <18 ng/L    Comment: (NOTE) Elevated high sensitivity troponin I (hsTnI) values and significant  changes across serial measurements may suggest ACS but many other  chronic and acute conditions are known to elevate hsTnI results.  Refer to the "Links" section for chest pain algorithms and additional  guidance. Performed at [facility], [address], [place], [place] [address]    DG Chest 2 View  Result Date: 05/04/2023 CLINICAL DATA:  Peripheral edema evaluation EXAM: CHEST - 2 VIEW COMPARISON:  Radiograph 03/26/2023 FINDINGS: Low lung volumes. Stable cardiomediastinal silhouette. Aortic atherosclerotic calcification. Elevated left hemidiaphragm. Small bilateral pleural effusions and associated airspace opacities similar to prior. Pulmonary vascular congestion. IMPRESSION: Small bilateral pleural effusions and associated airspace opacities similar to prior. Electronically Signed   By: Minerva Fester M.D.   On: 05/04/2023 16:26   VAS [place] ABI WITH/WO TBI  Result Date: 05/03/2023  LOWER EXTREMITY DOPPLER STUDY Patient Name:  Sarah Wolf  Date of Exam:   05/03/2023 Medical Rec #: [id number]   Accession #:    [id number] Date of Birth: 11-10-1934   Patient Gender: F Patient Age:   52 years Exam Location:  [facility] Procedure:      VAS [place] ABI WITH/WO TBI Referring Phys: Lemar Livings  --------------------------------------------------------------------------------  Indications: Peripheral artery disease. High Risk Factors: Hyperlipidemia.  Vascular Interventions: 03/15/23 thrombolysis right lower extremity                         03/16/23 right SFA stenting with angioplasty x2 (6 mm x 5                         cm Viabahn and 6 mm x 40 drug-coated Eluvia), right  Not evaluated +--------+------------------+-----+--------+-------------+ +-------+-------------+-------------+------------+------------+ ABI/TBIToday's ABI  Today's TBI  Previous ABIPrevious TBI +-------+-------------+-------------+------------+------------+ Right  1.09         0.00         0.00        0.00         +-------+-------------+-------------+------------+------------+ Left   Not evaluatedNot evaluated0.56        0.15         +-------+-------------+-------------+------------+------------+   Summary: Right: Resting right ankle-brachial index is within normal range. The right toe-brachial index is abnormal. *See table(s) above for measurements and observations.  Electronically signed by Lemar Livings MD on 05/03/2023 at 10:56:56 AM.    Final    VAS [place] LOWER EXTREMITY ARTERIAL DUPLEX  Result Date: 05/03/2023 LOWER EXTREMITY ARTERIAL DUPLEX STUDY Patient Name:  Sarah Wolf  Date of Exam:   05/03/2023 Medical Rec #: [id number]   Accession #:    [id number]  Date of Birth: 05-14-1935   Patient Gender: F Patient Age:   88 years Exam Location:  [facility] Procedure:      VAS [place] LOWER EXTREMITY ARTERIAL DUPLEX Referring Phys: Lemar Livings --------------------------------------------------------------------------------  Indications: Peripheral artery disease. High Risk Factors: Hyperlipidemia.  Vascular Interventions: 03/15/23 thrombolysis right lower extremity                         03/16/23 right SFA stenting with angioplasty x2 (6 mm x 5                         cm Viabahn and 6 mm x 40 drug-coated Eluvia), right                         lower extremity arteriogram. Current ABI:            right=1.09/0.00, left= not evaluated secondary to                         limitations Comparison Study: 03/14/23 Lower extremity arterial duplex- Right: Total                   occlusion noted in the superficial femoral artery.                   Reconstitution of the distal SFA via collateral. Dampened flow                   noted in the posterior tibial and anterior tibial ateries.                   Unable to detect flow in the peroneal artery.                    Left: Total occlusion noted in the popliteal artery.                   Reconstitution of the proximal PTA via collateral. Monophasic                   flow noted throughout the anterior tibial and peroneal                   arteries. Performing Technologist: Gertie Fey MHA, RDMS, RVT, RDCS  Examination Guidelines: A complete evaluation includes B-mode imaging, spectral Doppler, color Doppler, and power Doppler as needed of all accessible portions of each vessel.  Not evaluated +--------+------------------+-----+--------+-------------+ +-------+-------------+-------------+------------+------------+ ABI/TBIToday's ABI  Today's TBI  Previous ABIPrevious TBI +-------+-------------+-------------+------------+------------+ Right  1.09         0.00         0.00        0.00         +-------+-------------+-------------+------------+------------+ Left   Not evaluatedNot evaluated0.56        0.15         +-------+-------------+-------------+------------+------------+   Summary: Right: Resting right ankle-brachial index is within normal range. The right toe-brachial index is abnormal. *See table(s) above for measurements and observations.  Electronically signed by Lemar Livings MD on 05/03/2023 at 10:56:56 AM.    Final    VAS [place] LOWER EXTREMITY ARTERIAL DUPLEX  Result Date: 05/03/2023 LOWER EXTREMITY ARTERIAL DUPLEX STUDY Patient Name:  Sarah Wolf  Date of Exam:   05/03/2023 Medical Rec #: [id number]   Accession #:    [id number]  Date of Birth: 05-14-1935   Patient Gender: F Patient Age:   88 years Exam Location:  [facility] Procedure:      VAS [place] LOWER EXTREMITY ARTERIAL DUPLEX Referring Phys: Lemar Livings --------------------------------------------------------------------------------  Indications: Peripheral artery disease. High Risk Factors: Hyperlipidemia.  Vascular Interventions: 03/15/23 thrombolysis right lower extremity                         03/16/23 right SFA stenting with angioplasty x2 (6 mm x 5                         cm Viabahn and 6 mm x 40 drug-coated Eluvia), right                         lower extremity arteriogram. Current ABI:            right=1.09/0.00, left= not evaluated secondary to                         limitations Comparison Study: 03/14/23 Lower extremity arterial duplex- Right: Total                   occlusion noted in the superficial femoral artery.                   Reconstitution of the distal SFA via collateral. Dampened flow                   noted in the posterior tibial and anterior tibial ateries.                   Unable to detect flow in the peroneal artery.                    Left: Total occlusion noted in the popliteal artery.                   Reconstitution of the proximal PTA via collateral. Monophasic                   flow noted throughout the anterior tibial and peroneal                   arteries. Performing Technologist: Gertie Fey MHA, RDMS, RVT, RDCS  Examination Guidelines: A complete evaluation includes B-mode imaging, spectral Doppler, color Doppler, and power Doppler as needed of all accessible portions of each vessel.

## 2023-05-04 NOTE — Telephone Encounter (Signed)
Pt c/o swelling/edema: STAT if pt has developed SOB within 24 hours  If swelling, where is the swelling located?   Everywhere - arms, legs  How much weight have you gained and in what time span?   Yes  Have you gained 2 pounds in a day or 5 pounds in a week?  3.5 pounds in 2 days  Do you have a log of your daily weights (if so, list)?   No  Are you currently taking a fluid pill?   Yes  Are you currently SOB?   Yes  Have you traveled recently in a car or plane for an extended period of time?   Caller [name]) stated he is concerned patient has been retaining fluid and has had low oxygen levels over the past week.

## 2023-05-04 NOTE — Assessment & Plan Note (Signed)
This is a chronic diagnosis documented in the patient's medical chart.  Continue with apixaban, metoprolol.  I note that patient is on amiodarone, in spite of this patient is actually currently in A-fib.  I see little benefit if patient is not maintaining sinus rhythm, and she is already rate controlled.  I will hold amiodarone for now pending any expert and input.

## 2023-05-04 NOTE — Telephone Encounter (Signed)
Sarah Wolf called to advise Pt has increased form fluid . It is in her legs and shoulder. O2 is 88% and she is congested. Pt has an appt. Please advise . kh

## 2023-05-04 NOTE — Assessment & Plan Note (Signed)
Patient does seem to have chronic requirement of Lasix at home, however typically only takes 1 or 2 tablets a week.  Unfortunately is having progressive leg swelling over the last 10 days that has poorly been responsive to 20 mg of Lasix for continuously 5 days.  This does represent an atypical clinical course for this lady.  Therefore I agree with looking for underlying confounding factors.  I will check lower extremity Dopplers.  We will also repeat an echo, please note the last echo seems to have been done in late August with EF of 40% but also some right ventricular dilation as well as finding of worsening EF compared to prior baseline study that was done in 2020.  Troponin is negative.  I will also check LFTs and urine protein creatinine ratio. And urinalsyis.  With regards to treatment, patient received 40 mg of Lasix IV in the ER, I will continue this daily, monitor daily weights for the patient as well as intake and output.  Patient's hypokalemia was corrected by ER attending with 40 mEq of KCl.  We will recheck it again tomorrow.

## 2023-05-04 NOTE — ED Triage Notes (Signed)
Patient brought in by EMS due to more swelling in legs than normal. Per EMS report, patient has had a 3 lb weight gain since yesterday. Pt's home health nurse wanted her evaluated. Pt has no other complaints.

## 2023-05-04 NOTE — ED Provider Notes (Signed)
0.00         0.00        0.00         +-------+-------------+-------------+------------+------------+ Left   Not evaluatedNot evaluated0.56        0.15         +-------+-------------+-------------+------------+------------+   Summary: Right: Resting right ankle-brachial index is within normal range. The right toe-brachial index is abnormal. *See table(s) above for measurements and observations.  Electronically signed by Lemar Livings MD on 05/03/2023 at 10:56:56 AM.    Final    VAS [place] LOWER EXTREMITY ARTERIAL DUPLEX  Result Date: 05/03/2023 LOWER EXTREMITY ARTERIAL DUPLEX STUDY Patient Name:  Sarah Wolf  Date of Exam:   05/03/2023 Medical Rec #: [id number]   Accession #:    [id number] Date of Birth: January 04, 1935   Patient Gender: F Patient Age:   88 years Exam Location:  [facility] Procedure:      VAS [place] LOWER EXTREMITY ARTERIAL DUPLEX Referring Phys: Lemar Livings --------------------------------------------------------------------------------  Indications: Peripheral artery disease. High Risk Factors: Hyperlipidemia.  Vascular Interventions: 03/15/23 thrombolysis right lower extremity                         03/16/23 right SFA stenting with angioplasty x2 (6  mm x 5                         cm Viabahn and 6 mm x 40 drug-coated Eluvia), right                         lower extremity arteriogram. Current ABI:            right=1.09/0.00, left= not evaluated secondary to                         limitations Comparison Study: 03/14/23 Lower extremity arterial duplex- Right: Total                   occlusion noted in the superficial femoral artery.                   Reconstitution of the distal SFA via collateral. Dampened flow                   noted in the posterior tibial and anterior tibial ateries.                   Unable to detect flow in the peroneal artery.                    Left: Total occlusion noted in the popliteal artery.                   Reconstitution of the proximal PTA via collateral. Monophasic                   flow noted throughout the anterior tibial and peroneal                   arteries. Performing Technologist: Gertie Fey MHA, RDMS, RVT, RDCS  Examination Guidelines: A complete evaluation includes B-mode imaging, spectral Doppler, color Doppler, and power Doppler as needed of all accessible portions of each vessel. Bilateral testing is considered an integral part of a complete examination. Limited examinations for reoccurring indications may be performed as noted.  +-----------+--------+-----+--------+----------+-------------------------------+ RIGHT  Palpations: Abdomen is soft.     Tenderness: There is no abdominal tenderness. There is no guarding or rebound.  Musculoskeletal:        General: No tenderness or deformity.     Cervical back: Neck supple.     Right lower leg: Edema present.     Left lower leg: Edema present.     Comments: Pitting edema up to thighs  Skin:    General: Skin is warm and dry.     Findings: No rash.  Neurological:     General: No focal deficit present.     Mental Status: She is alert.     Cranial Nerves: No cranial nerve deficit, dysarthria or facial asymmetry.     Sensory: No sensory deficit.     Motor: No abnormal muscle tone or seizure activity.     Coordination: Coordination normal.  Psychiatric:        Mood and Affect: Mood normal.    ED Results / Procedures / Treatments   Labs (all labs ordered are listed, but only abnormal results are displayed) Labs Reviewed  CBC - Abnormal; Notable  for the following components:      Result Value   RBC 3.53 (*)    Hemoglobin 10.4 (*)    HCT 32.6 (*)    All other components within normal limits  BASIC METABOLIC PANEL - Abnormal; Notable for the following components:   Sodium 131 (*)    Potassium 3.2 (*)    Chloride 85 (*)    CO2 33 (*)    Glucose, Bld 103 (*)    BUN 27 (*)    GFR, Estimated 57 (*)    All other components within normal limits  BRAIN NATRIURETIC PEPTIDE - Abnormal; Notable for the following components:   B Natriuretic Peptide 1,260.5 (*)    All other components within normal limits  TROPONIN I (HIGH SENSITIVITY)  TROPONIN I (HIGH SENSITIVITY)    EKG EKG Interpretation Date/Time:  Thursday May 04 2023 12:52:02 EDT Ventricular Rate:  75 PR Interval:    QRS Duration:  83 QT Interval:  364 QTC Calculation: 407 R Axis:   55  Text Interpretation: Atrial fibrillation Ventricular premature complex Probable anterior infarct, age indeterminate No significant change since last tracing Confirmed by Linwood Dibbles [phone]) on 05/04/2023 3:28:35 PM  Radiology VAS [place] ABI WITH/WO TBI  Result Date: 05/03/2023  LOWER EXTREMITY DOPPLER STUDY Patient Name:  Sarah Wolf  Date of Exam:   05/03/2023 Medical Rec #: [id number]   Accession #:    [id number] Date of Birth: 31-Aug-1934   Patient Gender: F Patient Age:   27 years Exam Location:  [facility] Procedure:      VAS [place] ABI WITH/WO TBI Referring Phys: Lemar Livings --------------------------------------------------------------------------------  Indications: Peripheral artery disease. High Risk Factors: Hyperlipidemia.  Vascular Interventions: 03/15/23 thrombolysis right lower extremity                         03/16/23 right SFA stenting with angioplasty x2 (6 mm x 5                         cm Viabahn and 6 mm x 40 drug-coated Eluvia), right                         lower extremity arteriogram. Limitations: Today's exam was limited due to extensive weeping and bandaging  Palpations: Abdomen is soft.     Tenderness: There is no abdominal tenderness. There is no guarding or rebound.  Musculoskeletal:        General: No tenderness or deformity.     Cervical back: Neck supple.     Right lower leg: Edema present.     Left lower leg: Edema present.     Comments: Pitting edema up to thighs  Skin:    General: Skin is warm and dry.     Findings: No rash.  Neurological:     General: No focal deficit present.     Mental Status: She is alert.     Cranial Nerves: No cranial nerve deficit, dysarthria or facial asymmetry.     Sensory: No sensory deficit.     Motor: No abnormal muscle tone or seizure activity.     Coordination: Coordination normal.  Psychiatric:        Mood and Affect: Mood normal.    ED Results / Procedures / Treatments   Labs (all labs ordered are listed, but only abnormal results are displayed) Labs Reviewed  CBC - Abnormal; Notable  for the following components:      Result Value   RBC 3.53 (*)    Hemoglobin 10.4 (*)    HCT 32.6 (*)    All other components within normal limits  BASIC METABOLIC PANEL - Abnormal; Notable for the following components:   Sodium 131 (*)    Potassium 3.2 (*)    Chloride 85 (*)    CO2 33 (*)    Glucose, Bld 103 (*)    BUN 27 (*)    GFR, Estimated 57 (*)    All other components within normal limits  BRAIN NATRIURETIC PEPTIDE - Abnormal; Notable for the following components:   B Natriuretic Peptide 1,260.5 (*)    All other components within normal limits  TROPONIN I (HIGH SENSITIVITY)  TROPONIN I (HIGH SENSITIVITY)    EKG EKG Interpretation Date/Time:  Thursday May 04 2023 12:52:02 EDT Ventricular Rate:  75 PR Interval:    QRS Duration:  83 QT Interval:  364 QTC Calculation: 407 R Axis:   55  Text Interpretation: Atrial fibrillation Ventricular premature complex Probable anterior infarct, age indeterminate No significant change since last tracing Confirmed by Linwood Dibbles [phone]) on 05/04/2023 3:28:35 PM  Radiology VAS [place] ABI WITH/WO TBI  Result Date: 05/03/2023  LOWER EXTREMITY DOPPLER STUDY Patient Name:  Sarah Wolf  Date of Exam:   05/03/2023 Medical Rec #: [id number]   Accession #:    [id number] Date of Birth: 31-Aug-1934   Patient Gender: F Patient Age:   27 years Exam Location:  [facility] Procedure:      VAS [place] ABI WITH/WO TBI Referring Phys: Lemar Livings --------------------------------------------------------------------------------  Indications: Peripheral artery disease. High Risk Factors: Hyperlipidemia.  Vascular Interventions: 03/15/23 thrombolysis right lower extremity                         03/16/23 right SFA stenting with angioplasty x2 (6 mm x 5                         cm Viabahn and 6 mm x 40 drug-coated Eluvia), right                         lower extremity arteriogram. Limitations: Today's exam was limited due to extensive weeping and bandaging  Palpations: Abdomen is soft.     Tenderness: There is no abdominal tenderness. There is no guarding or rebound.  Musculoskeletal:        General: No tenderness or deformity.     Cervical back: Neck supple.     Right lower leg: Edema present.     Left lower leg: Edema present.     Comments: Pitting edema up to thighs  Skin:    General: Skin is warm and dry.     Findings: No rash.  Neurological:     General: No focal deficit present.     Mental Status: She is alert.     Cranial Nerves: No cranial nerve deficit, dysarthria or facial asymmetry.     Sensory: No sensory deficit.     Motor: No abnormal muscle tone or seizure activity.     Coordination: Coordination normal.  Psychiatric:        Mood and Affect: Mood normal.    ED Results / Procedures / Treatments   Labs (all labs ordered are listed, but only abnormal results are displayed) Labs Reviewed  CBC - Abnormal; Notable  for the following components:      Result Value   RBC 3.53 (*)    Hemoglobin 10.4 (*)    HCT 32.6 (*)    All other components within normal limits  BASIC METABOLIC PANEL - Abnormal; Notable for the following components:   Sodium 131 (*)    Potassium 3.2 (*)    Chloride 85 (*)    CO2 33 (*)    Glucose, Bld 103 (*)    BUN 27 (*)    GFR, Estimated 57 (*)    All other components within normal limits  BRAIN NATRIURETIC PEPTIDE - Abnormal; Notable for the following components:   B Natriuretic Peptide 1,260.5 (*)    All other components within normal limits  TROPONIN I (HIGH SENSITIVITY)  TROPONIN I (HIGH SENSITIVITY)    EKG EKG Interpretation Date/Time:  Thursday May 04 2023 12:52:02 EDT Ventricular Rate:  75 PR Interval:    QRS Duration:  83 QT Interval:  364 QTC Calculation: 407 R Axis:   55  Text Interpretation: Atrial fibrillation Ventricular premature complex Probable anterior infarct, age indeterminate No significant change since last tracing Confirmed by Linwood Dibbles [phone]) on 05/04/2023 3:28:35 PM  Radiology VAS [place] ABI WITH/WO TBI  Result Date: 05/03/2023  LOWER EXTREMITY DOPPLER STUDY Patient Name:  Sarah Wolf  Date of Exam:   05/03/2023 Medical Rec #: [id number]   Accession #:    [id number] Date of Birth: 31-Aug-1934   Patient Gender: F Patient Age:   27 years Exam Location:  [facility] Procedure:      VAS [place] ABI WITH/WO TBI Referring Phys: Lemar Livings --------------------------------------------------------------------------------  Indications: Peripheral artery disease. High Risk Factors: Hyperlipidemia.  Vascular Interventions: 03/15/23 thrombolysis right lower extremity                         03/16/23 right SFA stenting with angioplasty x2 (6 mm x 5                         cm Viabahn and 6 mm x 40 drug-coated Eluvia), right                         lower extremity arteriogram. Limitations: Today's exam was limited due to extensive weeping and bandaging  Palpations: Abdomen is soft.     Tenderness: There is no abdominal tenderness. There is no guarding or rebound.  Musculoskeletal:        General: No tenderness or deformity.     Cervical back: Neck supple.     Right lower leg: Edema present.     Left lower leg: Edema present.     Comments: Pitting edema up to thighs  Skin:    General: Skin is warm and dry.     Findings: No rash.  Neurological:     General: No focal deficit present.     Mental Status: She is alert.     Cranial Nerves: No cranial nerve deficit, dysarthria or facial asymmetry.     Sensory: No sensory deficit.     Motor: No abnormal muscle tone or seizure activity.     Coordination: Coordination normal.  Psychiatric:        Mood and Affect: Mood normal.    ED Results / Procedures / Treatments   Labs (all labs ordered are listed, but only abnormal results are displayed) Labs Reviewed  CBC - Abnormal; Notable  for the following components:      Result Value   RBC 3.53 (*)    Hemoglobin 10.4 (*)    HCT 32.6 (*)    All other components within normal limits  BASIC METABOLIC PANEL - Abnormal; Notable for the following components:   Sodium 131 (*)    Potassium 3.2 (*)    Chloride 85 (*)    CO2 33 (*)    Glucose, Bld 103 (*)    BUN 27 (*)    GFR, Estimated 57 (*)    All other components within normal limits  BRAIN NATRIURETIC PEPTIDE - Abnormal; Notable for the following components:   B Natriuretic Peptide 1,260.5 (*)    All other components within normal limits  TROPONIN I (HIGH SENSITIVITY)  TROPONIN I (HIGH SENSITIVITY)    EKG EKG Interpretation Date/Time:  Thursday May 04 2023 12:52:02 EDT Ventricular Rate:  75 PR Interval:    QRS Duration:  83 QT Interval:  364 QTC Calculation: 407 R Axis:   55  Text Interpretation: Atrial fibrillation Ventricular premature complex Probable anterior infarct, age indeterminate No significant change since last tracing Confirmed by Linwood Dibbles [phone]) on 05/04/2023 3:28:35 PM  Radiology VAS [place] ABI WITH/WO TBI  Result Date: 05/03/2023  LOWER EXTREMITY DOPPLER STUDY Patient Name:  Sarah Wolf  Date of Exam:   05/03/2023 Medical Rec #: [id number]   Accession #:    [id number] Date of Birth: 31-Aug-1934   Patient Gender: F Patient Age:   27 years Exam Location:  [facility] Procedure:      VAS [place] ABI WITH/WO TBI Referring Phys: Lemar Livings --------------------------------------------------------------------------------  Indications: Peripheral artery disease. High Risk Factors: Hyperlipidemia.  Vascular Interventions: 03/15/23 thrombolysis right lower extremity                         03/16/23 right SFA stenting with angioplasty x2 (6 mm x 5                         cm Viabahn and 6 mm x 40 drug-coated Eluvia), right                         lower extremity arteriogram. Limitations: Today's exam was limited due to extensive weeping and bandaging  Palpations: Abdomen is soft.     Tenderness: There is no abdominal tenderness. There is no guarding or rebound.  Musculoskeletal:        General: No tenderness or deformity.     Cervical back: Neck supple.     Right lower leg: Edema present.     Left lower leg: Edema present.     Comments: Pitting edema up to thighs  Skin:    General: Skin is warm and dry.     Findings: No rash.  Neurological:     General: No focal deficit present.     Mental Status: She is alert.     Cranial Nerves: No cranial nerve deficit, dysarthria or facial asymmetry.     Sensory: No sensory deficit.     Motor: No abnormal muscle tone or seizure activity.     Coordination: Coordination normal.  Psychiatric:        Mood and Affect: Mood normal.    ED Results / Procedures / Treatments   Labs (all labs ordered are listed, but only abnormal results are displayed) Labs Reviewed  CBC - Abnormal; Notable  for the following components:      Result Value   RBC 3.53 (*)    Hemoglobin 10.4 (*)    HCT 32.6 (*)    All other components within normal limits  BASIC METABOLIC PANEL - Abnormal; Notable for the following components:   Sodium 131 (*)    Potassium 3.2 (*)    Chloride 85 (*)    CO2 33 (*)    Glucose, Bld 103 (*)    BUN 27 (*)    GFR, Estimated 57 (*)    All other components within normal limits  BRAIN NATRIURETIC PEPTIDE - Abnormal; Notable for the following components:   B Natriuretic Peptide 1,260.5 (*)    All other components within normal limits  TROPONIN I (HIGH SENSITIVITY)  TROPONIN I (HIGH SENSITIVITY)    EKG EKG Interpretation Date/Time:  Thursday May 04 2023 12:52:02 EDT Ventricular Rate:  75 PR Interval:    QRS Duration:  83 QT Interval:  364 QTC Calculation: 407 R Axis:   55  Text Interpretation: Atrial fibrillation Ventricular premature complex Probable anterior infarct, age indeterminate No significant change since last tracing Confirmed by Linwood Dibbles [phone]) on 05/04/2023 3:28:35 PM  Radiology VAS [place] ABI WITH/WO TBI  Result Date: 05/03/2023  LOWER EXTREMITY DOPPLER STUDY Patient Name:  Sarah Wolf  Date of Exam:   05/03/2023 Medical Rec #: [id number]   Accession #:    [id number] Date of Birth: 31-Aug-1934   Patient Gender: F Patient Age:   27 years Exam Location:  [facility] Procedure:      VAS [place] ABI WITH/WO TBI Referring Phys: Lemar Livings --------------------------------------------------------------------------------  Indications: Peripheral artery disease. High Risk Factors: Hyperlipidemia.  Vascular Interventions: 03/15/23 thrombolysis right lower extremity                         03/16/23 right SFA stenting with angioplasty x2 (6 mm x 5                         cm Viabahn and 6 mm x 40 drug-coated Eluvia), right                         lower extremity arteriogram. Limitations: Today's exam was limited due to extensive weeping and bandaging  0.00         0.00        0.00         +-------+-------------+-------------+------------+------------+ Left   Not evaluatedNot evaluated0.56        0.15         +-------+-------------+-------------+------------+------------+   Summary: Right: Resting right ankle-brachial index is within normal range. The right toe-brachial index is abnormal. *See table(s) above for measurements and observations.  Electronically signed by Lemar Livings MD on 05/03/2023 at 10:56:56 AM.    Final    VAS [place] LOWER EXTREMITY ARTERIAL DUPLEX  Result Date: 05/03/2023 LOWER EXTREMITY ARTERIAL DUPLEX STUDY Patient Name:  Sarah Wolf  Date of Exam:   05/03/2023 Medical Rec #: [id number]   Accession #:    [id number] Date of Birth: January 04, 1935   Patient Gender: F Patient Age:   88 years Exam Location:  [facility] Procedure:      VAS [place] LOWER EXTREMITY ARTERIAL DUPLEX Referring Phys: Lemar Livings --------------------------------------------------------------------------------  Indications: Peripheral artery disease. High Risk Factors: Hyperlipidemia.  Vascular Interventions: 03/15/23 thrombolysis right lower extremity                         03/16/23 right SFA stenting with angioplasty x2 (6  mm x 5                         cm Viabahn and 6 mm x 40 drug-coated Eluvia), right                         lower extremity arteriogram. Current ABI:            right=1.09/0.00, left= not evaluated secondary to                         limitations Comparison Study: 03/14/23 Lower extremity arterial duplex- Right: Total                   occlusion noted in the superficial femoral artery.                   Reconstitution of the distal SFA via collateral. Dampened flow                   noted in the posterior tibial and anterior tibial ateries.                   Unable to detect flow in the peroneal artery.                    Left: Total occlusion noted in the popliteal artery.                   Reconstitution of the proximal PTA via collateral. Monophasic                   flow noted throughout the anterior tibial and peroneal                   arteries. Performing Technologist: Gertie Fey MHA, RDMS, RVT, RDCS  Examination Guidelines: A complete evaluation includes B-mode imaging, spectral Doppler, color Doppler, and power Doppler as needed of all accessible portions of each vessel. Bilateral testing is considered an integral part of a complete examination. Limited examinations for reoccurring indications may be performed as noted.  +-----------+--------+-----+--------+----------+-------------------------------+ RIGHT

## 2023-05-05 ENCOUNTER — Inpatient Hospital Stay (HOSPITAL_COMMUNITY): Payer: Medicare Other

## 2023-05-05 ENCOUNTER — Encounter: Payer: Medicare Other | Admitting: Thoracic Surgery (Cardiothoracic Vascular Surgery)

## 2023-05-05 DIAGNOSIS — M7989 Other specified soft tissue disorders: Secondary | ICD-10-CM | POA: Diagnosis not present

## 2023-05-05 DIAGNOSIS — I5043 Acute on chronic combined systolic (congestive) and diastolic (congestive) heart failure: Secondary | ICD-10-CM

## 2023-05-05 LAB — BASIC METABOLIC PANEL
Anion gap: 12 (ref 5–15)
BUN: 24 mg/dL — ABNORMAL HIGH (ref 8–23)
CO2: 33 mmol/L — ABNORMAL HIGH (ref 22–32)
Calcium: 8.4 mg/dL — ABNORMAL LOW (ref 8.9–10.3)
Chloride: 88 mmol/L — ABNORMAL LOW (ref 98–111)
Creatinine, Ser: 0.86 mg/dL (ref 0.44–1.00)
GFR, Estimated: >60 mL/min (ref >60)
Glucose, Bld: 133 mg/dL — ABNORMAL HIGH (ref 70–99)
Potassium: 3.1 mmol/L — ABNORMAL LOW (ref 3.5–5.1)
Sodium: 133 mmol/L — ABNORMAL LOW (ref 135–145)

## 2023-05-05 LAB — HEPATIC FUNCTION PANEL
ALT: 17 U/L (ref 0–44)
AST: 23 U/L (ref 15–41)
Albumin: 3.1 g/dL — ABNORMAL LOW (ref 3.5–5.0)
Alkaline Phosphatase: 52 U/L (ref 38–126)
Bilirubin, Direct: 0.2 mg/dL (ref 0.0–0.2)
Indirect Bilirubin: 0.9 mg/dL (ref 0.3–0.9)
Total Bilirubin: 1.1 mg/dL (ref 0.3–1.2)
Total Protein: 6.1 g/dL — ABNORMAL LOW (ref 6.5–8.1)

## 2023-05-05 LAB — ECHOCARDIOGRAM COMPLETE
Area-P 1/2: 4.68 cm2
Calc EF: 50.7 %
Height: 62 in
MV VTI: 2.36 cm2
S' Lateral: 2.9 cm
Single Plane A2C EF: 53.9 %
Single Plane A4C EF: 49.1 %
Weight: 2458.57 [oz_av]

## 2023-05-05 LAB — APTT: aPTT: 47 s — ABNORMAL HIGH (ref 24–36)

## 2023-05-05 LAB — CBC
HCT: 30.6 % — ABNORMAL LOW (ref 36.0–46.0)
Hemoglobin: 9.8 g/dL — ABNORMAL LOW (ref 12.0–15.0)
MCH: 29.6 pg (ref 26.0–34.0)
MCHC: 32 g/dL (ref 30.0–36.0)
MCV: 92.4 fL (ref 80.0–100.0)
Platelets: 257 10*3/uL (ref 150–400)
RBC: 3.31 MIL/uL — ABNORMAL LOW (ref 3.87–5.11)
RDW: 14.9 % (ref 11.5–15.5)
WBC: 9.5 10*3/uL (ref 4.0–10.5)
nRBC: 0 % (ref 0.0–0.2)

## 2023-05-05 LAB — PROTIME-INR
INR: 2 — ABNORMAL HIGH (ref 0.8–1.2)
Prothrombin Time: 22.6 s — ABNORMAL HIGH (ref 11.4–15.2)

## 2023-05-05 MED ORDER — POTASSIUM CHLORIDE CRYS ER 20 MEQ PO TBCR
40.0000 meq | EXTENDED_RELEASE_TABLET | ORAL | Status: AC
  Administered 2023-05-05 (×2): 40 meq via ORAL
  Filled 2023-05-05 (×2): qty 2

## 2023-05-05 MED ORDER — ORAL CARE MOUTH RINSE: 15.0000 mL | OROMUCOSAL | Status: DC | PRN

## 2023-05-05 NOTE — Progress Notes (Signed)
  Echocardiogram 2D Echocardiogram has been performed.  Sarah Wolf 05/05/2023, 3:27 PM

## 2023-05-05 NOTE — Progress Notes (Signed)
Mobility Specialist - Progress Note   05/05/23 1436  Mobility  Activity Transferred from bed to chair  Level of Assistance Minimal assist, patient does 75% or more  Assistive Device Front wheel walker  Range of Motion/Exercises Active  Activity Response Tolerated well  Mobility Referral Yes  $Mobility charge 1 Mobility  Mobility Specialist Start Time (ACUTE ONLY) 1425  Mobility Specialist Stop Time (ACUTE ONLY) 1436  Mobility Specialist Time Calculation (min) (ACUTE ONLY) 11 min   Pt was found in bed and agreeable to transfer to recliner chair. Pt was min-A for bed mobility and CG for transfer. Pt was left on recliner chair with all needs met. Call bell in reach and chair alarm on. RN notified.  Billey Chang Mobility Specialist

## 2023-05-05 NOTE — Progress Notes (Addendum)
[facility] Progress Note Patient: Sarah Wolf ZOX:[id number] DOB: Jan 10, 1935 DOA: 05/04/2023  DOS: the patient was seen and examined on 05/05/2023  Brief hospital course: PMH of HTN, PAD, diastolic CHF presented to hospital with complaints of shortness of breath as well as volume overload. Currently receiving IV diuresis.  Assessment and Plan: Acute on chronic combined systolic and diastolic CHF. Receiving IV diuresis. Echocardiogram in August shows EF 40%. Monitor for now.  Persistent A-fib. On Eliquis, amiodarone as well as metoprolol. Was in A-fib at the time of admission. For now we will monitor.  Acute hypoxia. In the setting of CHF. Monitor for improvement.  PUD. GERD. Multiple bleeding ulcers. On anticoagulation. For now we will monitor.  PAD. Recently admitted for occlusion of the right SFA and left popliteal artery. Underwent angiogram with SFA stent. On Eliquis.  Reported history of DVT. On dual anticoagulation. For now we will monitor.  Type 2 diabetes mellitus, diet controlled. Globin A1c 6.6.  Well-controlled. Currently on sliding scale insulin.  Anterior mediastinum CT nodule. Outpatient follow-up.  Diaphragmatic hernia. At risk for aspiration. Will monitor.  Lower extremity ulcers. For now we will monitor. Continue to rest and changes.  Hypokalemia. Replacing. Monitor.  Subjective: No nausea no vomiting.  Breathing still heavy but improving.  No cough.  No fever no chills.  Physical Exam: General: in Mild distress, No Rash Cardiovascular: S1 and S2 Present, No Murmur Respiratory: Good respiratory effort, Bilateral Air entry present.  Basal crackles, No wheezes Abdomen: Bowel Sound present, No tenderness Extremities: Bilateral edema Neuro: Alert and oriented x3, no new focal deficit  Data Reviewed: I have Reviewed nursing notes, Vitals, and Lab results. Since last encounter, pertinent lab results CBC and BMP   . I have ordered  test including CBC and BMP  .   Disposition: Status is: Inpatient Remains inpatient appropriate because: Need for further diuresis  SCDs Start: 05/04/23 1715 apixaban (ELIQUIS) tablet 5 mg   Family Communication: Family at bedside Level of care: Telemetry   Vitals:   05/05/23 0503 05/05/23 0920 05/05/23 0925 05/05/23 1822  BP: 137/76   131/84  Pulse: 77   84  Resp: 18   18  Temp: 97.8 F (36.6 C)   98.7 F (37.1 C)  TempSrc: Oral   Oral  SpO2: 99% (!) 81% 97% 95%  Weight:      Height:         Author: Lynden Oxford, MD 05/05/2023 6:25 PM  Please look on [email or web address] to find out who is on call.

## 2023-05-05 NOTE — Progress Notes (Signed)
Lower extremity venous duplex completed. Please see CV Procedures for preliminary results.  Shona Simpson, RVT 05/05/23 2:57 PM

## 2023-05-05 NOTE — Plan of Care (Signed)
CHL Tonsillectomy/Adenoidectomy, Postoperative PEDS care plan entered in error.

## 2023-05-05 NOTE — Plan of Care (Signed)
  Problem: Activity: Goal: Ability to tolerate increased activity will improve Outcome: Progressing   Problem: Education: Goal: Knowledge of General Education information will improve Description: Including pain rating scale, medication(s)/side effects and non-pharmacologic comfort measures Outcome: Progressing   Problem: Clinical Measurements: Goal: Respiratory complications will improve Outcome: Progressing   Problem: Activity: Goal: Risk for activity intolerance will decrease Outcome: Progressing   Problem: Skin Integrity: Goal: Risk for impaired skin integrity will decrease Outcome: Progressing

## 2023-05-06 DIAGNOSIS — J9621 Acute and chronic respiratory failure with hypoxia: Secondary | ICD-10-CM | POA: Diagnosis not present

## 2023-05-06 LAB — CBC
HCT: 30.5 % — ABNORMAL LOW (ref 36.0–46.0)
Hemoglobin: 9.5 g/dL — ABNORMAL LOW (ref 12.0–15.0)
MCH: 29.7 pg (ref 26.0–34.0)
MCHC: 31.1 g/dL (ref 30.0–36.0)
MCV: 95.3 fL (ref 80.0–100.0)
Platelets: 267 10*3/uL (ref 150–400)
RBC: 3.2 MIL/uL — ABNORMAL LOW (ref 3.87–5.11)
RDW: 15.1 % (ref 11.5–15.5)
WBC: 8.3 10*3/uL (ref 4.0–10.5)
nRBC: 0 % (ref 0.0–0.2)

## 2023-05-06 LAB — BASIC METABOLIC PANEL
Anion gap: 9 (ref 5–15)
BUN: 22 mg/dL (ref 8–23)
CO2: 33 mmol/L — ABNORMAL HIGH (ref 22–32)
Calcium: 8.6 mg/dL — ABNORMAL LOW (ref 8.9–10.3)
Chloride: 90 mmol/L — ABNORMAL LOW (ref 98–111)
Creatinine, Ser: 0.94 mg/dL (ref 0.44–1.00)
GFR, Estimated: 59 mL/min — ABNORMAL LOW (ref >60)
Glucose, Bld: 97 mg/dL (ref 70–99)
Potassium: 3.7 mmol/L (ref 3.5–5.1)
Sodium: 132 mmol/L — ABNORMAL LOW (ref 135–145)

## 2023-05-06 LAB — MAGNESIUM: Magnesium: 2.1 mg/dL (ref 1.7–2.4)

## 2023-05-06 NOTE — Progress Notes (Signed)
SATURATION QUALIFICATIONS: (This note is used to comply with regulatory documentation for home oxygen)  Patient Saturations on Room Air at Rest = 80%  Patient Saturations on Room Air while Ambulating = N/A  Patient Saturations on  Liters of oxygen while Ambulating =   Please briefly explain why patient needs home oxygen: Pt requires oxygen at rest and while ambulating

## 2023-05-06 NOTE — Progress Notes (Signed)
Mobility Specialist - Progress Note  Pre-mobility: 75 bpm HR, 97% SpO2 During mobility: 103 bpm HR, 92% SpO2 Post-mobility: 90 bpm HR, 95% SPO2   05/06/23 1328  Oxygen Therapy  O2 Device Nasal Cannula  O2 Flow Rate (L/min) 2 L/min  Patient Activity (if Appropriate) Ambulating  Mobility  Activity Ambulated with assistance in hallway  Level of Assistance Contact guard assist, steadying assist  Assistive Device Front wheel walker  Distance Ambulated (ft) 90 ft  Range of Motion/Exercises Active  Activity Response Tolerated well  Mobility Referral Yes  $Mobility charge 1 Mobility  Mobility Specialist Start Time (ACUTE ONLY) 1300  Mobility Specialist Stop Time (ACUTE ONLY) 1328  Mobility Specialist Time Calculation (min) (ACUTE ONLY) 28 min   Pt was found in bed and agreeable to ambulate. Stated being fearful of falling. Pt at EOS returned to bed with all needs met. Call bell in reach. Bed alarm on.  Billey Chang Mobility Specialist

## 2023-05-06 NOTE — Hospital Course (Signed)
Brief hospital course: PMH of HTN, PAD, diastolic CHF presented to hospital with complaints of shortness of breath as well as volume overload. Currently receiving IV diuresis.   Assessment and Plan: Acute on chronic combined systolic and diastolic CHF. Treated with IV diuresis.  Now appears to be euvolemic and the creatinine also has bumped up therefore will hold diuresis. Echocardiogram in August shows EF 40%. Monitor for now.   Persistent A-fib. On Eliquis, amiodarone as well as metoprolol. Was in A-fib at the time of admission. For now we will monitor.   Acute hypoxic respiratory failure. Suspect there is a chronic component likely due to recurrent aspiration as well as large left-sided hiatal hernia. Oxygen saturation 66% on room air on 10/19 at rest and 80% on room air on 10/20. Incentive spirometry recommended. Will perform CT chest to ensure no other abnormalities.   PUD. GERD. Multiple bleeding ulcers. On anticoagulation. For now we will monitor.   PAD. Recently admitted for occlusion of the right SFA and left popliteal artery. Underwent angiogram with SFA stent. On Eliquis.   Reported history of DVT. On dual anticoagulation. For now we will monitor.   Type 2 diabetes mellitus, diet controlled. hemoGlobin A1c 6.6.  Well-controlled. Currently on sliding scale insulin.   Anterior mediastinum CT nodule. Outpatient follow-up.   Large diaphragmatic hernia. At risk for aspiration. Will monitor.   Lower extremity ulcers. For now we will monitor. Continue to rest and changes.   Hypokalemia. Replacing. Monitor.  Dysphagia. Prior history of dysphagia.  Had speech evaluation and recommendation is for D3 diet.  Diet changed.

## 2023-05-06 NOTE — Progress Notes (Signed)
[facility] Progress Note Patient: Sarah Wolf [id number]:[id number] DOB: [birth year] DOA: 05/04/2023  DOS: the patient was seen and examined on 05/06/2023  Brief hospital course: PMH of HTN, PAD, diastolic CHF presented to hospital with complaints of shortness of breath as well as volume overload. Currently receiving IV diuresis.   Assessment and Plan: Acute on chronic combined systolic and diastolic CHF. Receiving IV diuresis. Echocardiogram in August shows EF 40%. Monitor for now.   Persistent A-fib. On Eliquis, amiodarone as well as metoprolol. Was in A-fib at the time of admission. For now we will monitor.   Acute hypoxic respiratory failure. Suspect there is a chronic component which is not identified prior to admission. Patient actually dropped down to 66% on room air at the time of my evaluation. Will repeat chest x-ray. If the chest x-ray shows abnormality patient may require CT chest for further delineation and may require thoracentesis for further workup. Incentive spirometry recommended.   PUD. GERD. Multiple bleeding ulcers. On anticoagulation. For now we will monitor.   PAD. Recently admitted for occlusion of the right SFA and left popliteal artery. Underwent angiogram with SFA stent. On Eliquis.   Reported history of DVT. On dual anticoagulation. For now we will monitor.   Type 2 diabetes mellitus, diet controlled. Globin A1c 6.6.  Well-controlled. Currently on sliding scale insulin.   Anterior mediastinum CT nodule. Outpatient follow-up.   Diaphragmatic hernia. At risk for aspiration. Will monitor.   Lower extremity ulcers. For now we will monitor. Continue to rest and changes.   Hypokalemia. Replacing. Monitor.   Subjective: Continues to have shortness of breath.  No nausea no vomiting.  Swelling in the leg unchanged.  Physical Exam: General: in Mild distress, No Rash Cardiovascular: S1 and S2 Present, No Murmur Respiratory: Good  respiratory effort, Bilateral Air entry present.  Bilateral crackles, No wheezes Abdomen: Bowel Sound present, No tenderness Extremities: Bilateral edema Neuro: Alert and oriented x3, no new focal deficit  Data Reviewed: I have Reviewed nursing notes, Vitals, and Lab results. Since last encounter, pertinent lab results CBC and BMP   . I have ordered test including CBC and BMP  . I have discussed pt's care plan and test results with chest x-ray  .   Disposition: Status is: Inpatient Remains inpatient appropriate because: Need for further workup  SCDs Start: 05/04/23 1715 apixaban (ELIQUIS) tablet 5 mg   Family Communication: Discussed with family on the phone Level of care: Telemetry continue to CHF Vitals:   05/06/23 0922 05/06/23 0932 05/06/23 0935 05/06/23 1356  BP: 117/68   119/80  Pulse: 69   70  Resp:    18  Temp:    98 F (36.7 C)  TempSrc:    Oral  SpO2: 100% (!) 80% 98% 92%  Weight:      Height:         Author: Lynden Oxford, MD 05/06/2023 6:48 PM  Please look on [email or web address] to find out who is on call.

## 2023-05-07 ENCOUNTER — Inpatient Hospital Stay (HOSPITAL_COMMUNITY): Payer: Medicare Other

## 2023-05-07 DIAGNOSIS — J9601 Acute respiratory failure with hypoxia: Secondary | ICD-10-CM

## 2023-05-07 LAB — CBC
HCT: 30.7 % — ABNORMAL LOW (ref 36.0–46.0)
Hemoglobin: 9.4 g/dL — ABNORMAL LOW (ref 12.0–15.0)
MCH: 29.1 pg (ref 26.0–34.0)
MCHC: 30.6 g/dL (ref 30.0–36.0)
MCV: 95 fL (ref 80.0–100.0)
Platelets: 262 10*3/uL (ref 150–400)
RBC: 3.23 MIL/uL — ABNORMAL LOW (ref 3.87–5.11)
RDW: 15 % (ref 11.5–15.5)
WBC: 6.7 10*3/uL (ref 4.0–10.5)
nRBC: 0 % (ref 0.0–0.2)

## 2023-05-07 LAB — BASIC METABOLIC PANEL
Anion gap: 11 (ref 5–15)
BUN: 23 mg/dL (ref 8–23)
CO2: 33 mmol/L — ABNORMAL HIGH (ref 22–32)
Calcium: 8.7 mg/dL — ABNORMAL LOW (ref 8.9–10.3)
Chloride: 90 mmol/L — ABNORMAL LOW (ref 98–111)
Creatinine, Ser: 1.15 mg/dL — ABNORMAL HIGH (ref 0.44–1.00)
GFR, Estimated: 46 mL/min — ABNORMAL LOW (ref >60)
Glucose, Bld: 112 mg/dL — ABNORMAL HIGH (ref 70–99)
Potassium: 3.4 mmol/L — ABNORMAL LOW (ref 3.5–5.1)
Sodium: 134 mmol/L — ABNORMAL LOW (ref 135–145)

## 2023-05-07 LAB — MAGNESIUM: Magnesium: 2.2 mg/dL (ref 1.7–2.4)

## 2023-05-07 MED ORDER — POTASSIUM CHLORIDE CRYS ER 20 MEQ PO TBCR
40.0000 meq | EXTENDED_RELEASE_TABLET | Freq: Once | ORAL | Status: AC
  Administered 2023-05-07: 40 meq via ORAL
  Filled 2023-05-07: qty 2

## 2023-05-07 NOTE — Progress Notes (Signed)
[facility] Progress Note Patient: Sarah Wolf [id number]:[id number] DOB: 1935/04/29 DOA: 05/04/2023  DOS: the patient was seen and examined on 05/07/2023  Brief hospital course: PMH of HTN, PAD, diastolic CHF presented to hospital with complaints of shortness of breath as well as volume overload. Currently receiving IV diuresis.   Assessment and Plan: Acute on chronic combined systolic and diastolic CHF. Treated with IV diuresis.  Now appears to be euvolemic and the creatinine also has bumped up therefore will hold diuresis. Echocardiogram in August shows EF 40%. Monitor for now.   Persistent A-fib. On Eliquis, amiodarone as well as metoprolol. Was in A-fib at the time of admission. For now we will monitor.   Acute hypoxic respiratory failure. Suspect there is a chronic component likely due to recurrent aspiration as well as large left-sided hiatal hernia. Oxygen saturation 66% on room air on 10/19 at rest and 80% on room air on 10/20. Incentive spirometry recommended. Will perform CT chest to ensure no other abnormalities.   PUD. GERD. Multiple bleeding ulcers. On anticoagulation. For now we will monitor.   PAD. Recently admitted for occlusion of the right SFA and left popliteal artery. Underwent angiogram with SFA stent. On Eliquis.   Reported history of DVT. On dual anticoagulation. For now we will monitor.   Type 2 diabetes mellitus, diet controlled. hemoGlobin A1c 6.6.  Well-controlled. Currently on sliding scale insulin.   Anterior mediastinum CT nodule. Outpatient follow-up.   Large diaphragmatic hernia. At risk for aspiration. Will monitor.   Lower extremity ulcers. For now we will monitor. Continue to rest and changes.   Hypokalemia. Replacing. Monitor.  Dysphagia. Prior history of dysphagia.  Had speech evaluation and recommendation is for D3 diet.  Diet changed.   Subjective: Shortness of breath improving.  No nausea no vomiting.  No chest  pain.  Physical Exam: General: in Mild distress, No Rash Cardiovascular: S1 and S2 Present, No Murmur Respiratory: Good respiratory effort, Bilateral Air entry present.  Basal crackles, No wheezes Abdomen: Bowel Sound present, No tenderness Extremities: No edema Neuro: Alert and oriented x3, no new focal deficit  Data Reviewed: I have Reviewed nursing notes, Vitals, and Lab results. Since last encounter, pertinent lab results CBC and CMP   . I have ordered test including CBC and CMP, procalcitonin  . I have ordered imaging chest x-ray and CT of the chest without contrast  .   Disposition: Status is: Inpatient Remains inpatient appropriate because: Monitor for improvement in oxygenation and further workup  SCDs Start: 05/04/23 1715 apixaban (ELIQUIS) tablet 5 mg   Family Communication: Family at bedside Level of care: Telemetry   Vitals:   05/06/23 2103 05/07/23 0500 05/07/23 0502 05/07/23 1447  BP: 122/78  (!) 164/98 123/80  Pulse: 79  67 76  Resp:   16 18  Temp:   98.2 F (36.8 C) 98.2 F (36.8 C)  TempSrc:   Oral Oral  SpO2: 96%  98% 95%  Weight:  67.8 kg    Height:         Author: Lynden Oxford, MD 05/07/2023 5:30 PM  Please look on [email or web address] to find out who is on call.

## 2023-05-08 DIAGNOSIS — J9601 Acute respiratory failure with hypoxia: Secondary | ICD-10-CM | POA: Diagnosis not present

## 2023-05-08 LAB — CBC
HCT: 32.7 % — ABNORMAL LOW (ref 36.0–46.0)
Hemoglobin: 10.9 g/dL — ABNORMAL LOW (ref 12.0–15.0)
MCH: 29.8 pg (ref 26.0–34.0)
MCHC: 33.3 g/dL (ref 30.0–36.0)
MCV: 89.3 fL (ref 80.0–100.0)
Platelets: 269 10*3/uL (ref 150–400)
RBC: 3.66 MIL/uL — ABNORMAL LOW (ref 3.87–5.11)
RDW: 14.9 % (ref 11.5–15.5)
WBC: 7.5 10*3/uL (ref 4.0–10.5)
nRBC: 0 % (ref 0.0–0.2)

## 2023-05-08 LAB — BASIC METABOLIC PANEL
Anion gap: 11 (ref 5–15)
BUN: 19 mg/dL (ref 8–23)
CO2: 33 mmol/L — ABNORMAL HIGH (ref 22–32)
Calcium: 8.9 mg/dL (ref 8.9–10.3)
Chloride: 88 mmol/L — ABNORMAL LOW (ref 98–111)
Creatinine, Ser: 0.92 mg/dL (ref 0.44–1.00)
GFR, Estimated: >60 mL/min (ref >60)
Glucose, Bld: 138 mg/dL — ABNORMAL HIGH (ref 70–99)
Potassium: 3.5 mmol/L (ref 3.5–5.1)
Sodium: 132 mmol/L — ABNORMAL LOW (ref 135–145)

## 2023-05-08 LAB — MAGNESIUM: Magnesium: 2.1 mg/dL (ref 1.7–2.4)

## 2023-05-08 LAB — PROCALCITONIN: Procalcitonin: <0.1 ng/mL

## 2023-05-08 LAB — LACTATE DEHYDROGENASE: LDH: 216 U/L — ABNORMAL HIGH (ref 98–192)

## 2023-05-08 MED ORDER — POLYETHYLENE GLYCOL 3350 17 G PO PACK
17.0000 g | PACK | Freq: Every day | ORAL | Status: DC
  Administered 2023-05-08: 17 g via ORAL
  Filled 2023-05-08: qty 1

## 2023-05-08 MED ORDER — SENNOSIDES-DOCUSATE SODIUM 8.6-50 MG PO TABS
1.0000 | ORAL_TABLET | Freq: Two times a day (BID) | ORAL | Status: DC
  Administered 2023-05-08: 1 via ORAL
  Filled 2023-05-08: qty 1

## 2023-05-08 MED ORDER — POLYETHYLENE GLYCOL 3350 17 G PO PACK: 17.0000 g | PACK | Freq: Every day | ORAL | 0 refills | Status: DC | PRN

## 2023-05-08 MED ORDER — POTASSIUM CHLORIDE CRYS ER 20 MEQ PO TBCR: 20.0000 meq | EXTENDED_RELEASE_TABLET | ORAL | 0 refills | Status: DC

## 2023-05-08 MED ORDER — FUROSEMIDE 20 MG PO TABS: 20.0000 mg | ORAL_TABLET | ORAL | 0 refills | Status: DC

## 2023-05-08 MED ORDER — PANTOPRAZOLE SODIUM 40 MG PO TBEC: 40.0000 mg | DELAYED_RELEASE_TABLET | Freq: Two times a day (BID) | ORAL | Status: DC

## 2023-05-08 NOTE — TOC CM/SW Note (Deleted)
    Durable Medical Equipment  (From admission, onward)           Start     Ordered   05/08/23 1136  For home use only DME oxygen  Once       Question Answer Comment  Length of Need Lifetime   Mode or (Route) Nasal cannula   Frequency Continuous (stationary and portable oxygen unit needed)   Oxygen conserving device Yes   Oxygen delivery system Gas      05/08/23 1137

## 2023-05-08 NOTE — Evaluation (Signed)
Physical Therapy Evaluation Patient Details Name: Sarah Wolf MRN: [id number] DOB: 1934/08/28 Today's Date: 05/08/2023  History of Present Illness  Patient is 88 y.o. female presented to hospital with complaints of shortness of breath as well as volume overload. PMH significant ofr HTN, PAD, CHF,   Clinical Impression  Sarah Wolf is 88 y.o. female admitted with above HPI and diagnosis. Patient is currently limited by functional impairments below (see PT problem list). Patient lives alone and is independent at baseline with intermittent assist from her nephew. Pt ambulated ~200' with CGA and RW fading to supervision; desats on RA to 80's but maintained O2 on 2L/min. Patient will benefit from continued skilled PT interventions to address impairments and progress independence with mobility, recommending HHPT for endurance training and O2 line management. Acute PT will follow and progress as able.         If plan is discharge home, recommend the following: A little help with walking and/or transfers;A little help with bathing/dressing/bathroom;Assistance with cooking/housework;Direct supervision/assist for medications management;Assist for transportation;Help with stairs or ramp for entrance;Supervision due to cognitive status   Can travel by private vehicle        Equipment Recommendations None recommended by PT  Recommendations for Other Services       Functional Status Assessment Patient has had a recent decline in their functional status and demonstrates the ability to make significant improvements in function in a reasonable and predictable amount of time.     Precautions / Restrictions Precautions Precautions: Fall Precaution Comments: montior O2 Restrictions Weight Bearing Restrictions: No      Mobility  Bed Mobility Overal bed mobility: Needs Assistance Bed Mobility: Supine to Sit     Supine to sit: Supervision     General bed mobility comments: with increased time     Transfers Overall transfer level: Needs assistance Equipment used: Rolling walker (2 wheels) Transfers: Sit to/from Stand Sit to Stand: Supervision           General transfer comment: sup for safety, use of hands to rise and lower.    Ambulation/Gait Ambulation/Gait assistance: Contact guard assist, Supervision Gait Distance (Feet): 200 Feet Assistive device: Rolling walker (2 wheels) Gait Pattern/deviations: Step-through pattern, Decreased stride length, Shuffle Gait velocity: decr     General Gait Details: cues for proximity to RW, overall steady. SpO2 94% on 2L/min, 90% on RA with gait and desat to 80's at end of gait once seated.  Stairs            Wheelchair Mobility     Tilt Bed    Modified Rankin (Stroke Patients Only)       Balance Overall balance assessment: Mild deficits observed, not formally tested                                           Pertinent Vitals/Pain Pain Assessment Pain Assessment: No/denies pain    Home Living Family/patient expects to be discharged to:: Private residence Living Arrangements: Alone Available Help at Discharge: Family;Available PRN/intermittently Type of Home: House Home Access: Ramped entrance       Home Layout: One level Home Equipment: Cane - single point      Prior Function Prior Level of Function : Independent/Modified Independent;Driving             Mobility Comments: Using cane at home       Extremity/Trunk Assessment  Upper Extremity Assessment Upper Extremity Assessment: Defer to OT evaluation RUE Deficits / Details: h/o shoulder injury for about 6 months per patient report. "they told me i am too old" when asked what MD said about this issue. patietn is able to abduct shoulder about 40 degrees actively no FF. elbow hand and wrist WFL. noted to have edema and small skin tears pre existing on anterior elbow prior to start of session    Lower Extremity  Assessment Lower Extremity Assessment: Generalized weakness    Cervical / Trunk Assessment Cervical / Trunk Assessment: Kyphotic  Communication   Communication Communication: No apparent difficulties  Cognition Arousal: Alert Behavior During Therapy: WFL for tasks assessed/performed Overall Cognitive Status: Within Functional Limits for tasks assessed                                 General Comments: patient was plesant and cooperative. patient knew she was in the hosptial. patients nephew was present during session as well.        General Comments      Exercises     Assessment/Plan    PT Assessment Patient needs continued PT services  PT Problem List Decreased balance;Decreased mobility;Decreased knowledge of use of DME;Decreased safety awareness;Decreased knowledge of precautions;Cardiopulmonary status limiting activity;Decreased activity tolerance;Decreased strength       PT Treatment Interventions DME instruction;Gait training;Stair training;Functional mobility training;Therapeutic activities;Therapeutic exercise;Balance training;Neuromuscular re-education;Cognitive remediation;Patient/family education    PT Goals (Current goals can be found in the Care Plan section)  Acute Rehab PT Goals Patient Stated Goal: get better and home PT Goal Formulation: With patient Time For Goal Achievement: 05/22/23 Potential to Achieve Goals: Good    Frequency Min 1X/week     Co-evaluation               AM-PAC PT "6 Clicks" Mobility  Outcome Measure Help needed turning from your back to your side while in a flat bed without using bedrails?: A Little Help needed moving from lying on your back to sitting on the side of a flat bed without using bedrails?: A Little Help needed moving to and from a bed to a chair (including a wheelchair)?: A Little Help needed standing up from a chair using your arms (e.g., wheelchair or bedside chair)?: A Little Help needed to  walk in hospital room?: A Little Help needed climbing 3-5 steps with a railing? : A Little 6 Click Score: 18    End of Session Equipment Utilized During Treatment: Gait belt;Oxygen Activity Tolerance: Patient tolerated treatment well Patient left: in chair;with call bell/phone within reach;with chair alarm set;with family/visitor present Nurse Communication: Mobility status PT Visit Diagnosis: Other abnormalities of gait and mobility (R26.89);Muscle weakness (generalized) (M62.81);Difficulty in walking, not elsewhere classified (R26.2);Other symptoms and signs involving the nervous system (R29.898);Unsteadiness on feet (R26.81)    Time: 1610-9604 PT Time Calculation (min) (ACUTE ONLY): 17 min   Charges:   PT Evaluation $PT Eval Low Complexity: 1 Low   PT General Charges $$ ACUTE PT VISIT: 1 Visit         Wynn Maudlin, DPT Acute Rehabilitation Services Office [phone]  05/08/23 4:14 PM

## 2023-05-08 NOTE — Evaluation (Signed)
Occupational Therapy Evaluation Patient Details Name: Sarah Wolf MRN: [id number] DOB: 13-Nov-1934 Today's Date: 05/08/2023   History of Present Illness Patient is 88 y.o. female presented to hospital with complaints of shortness of breath as well as volume overload. PMH significant ofr HTN, PAD, CHF,   Clinical Impression   Patient is a 88 year old female who was admitted for above. Patient is a 88 year old female who lives at home alone with nephew support. Currently patient needs O2 to maintain above 90% during functional mobility with patient dropping to 80s on RA during session. Patient will need 24/7 caregiver support in next level of care for safety with O2 cord management and IADL tasks. Patient was noted to have decreased functional activity tolerance, decreased endurance, decreased standing balance, decreased safety awareness, and decreased knowledge of AD/AE impacting participation in ADLs. Patients nephew reported that he would line up more caregivers support for patient in next level of care with [facility] support. Patient would continue to benefit from skilled OT services at this time while admitted and after d/c to address noted deficits in order to improve overall safety and independence in ADLs.        If plan is discharge home, recommend the following: A little help with walking and/or transfers;A little help with bathing/dressing/bathroom;Assistance with cooking/housework;Assist for transportation;Direct supervision/assist for medications management;Direct supervision/assist for financial management;Help with stairs or ramp for entrance    Functional Status Assessment  Patient has had a recent decline in their functional status and demonstrates the ability to make significant improvements in function in a reasonable and predictable amount of time.  Equipment Recommendations  None recommended by OT       Precautions / Restrictions Precautions Precaution Comments: montior  O2 Restrictions Weight Bearing Restrictions: No      Mobility Bed Mobility Overal bed mobility: Needs Assistance Bed Mobility: Supine to Sit     Supine to sit: Supervision     General bed mobility comments: with increased time           Balance Overall balance assessment: Mild deficits observed, not formally tested           ADL either performed or assessed with clinical judgement   ADL Overall ADL's : Needs assistance/impaired Eating/Feeding: Supervision/ safety;Sitting       Upper Body Bathing: Set up;Sitting   Lower Body Bathing: Minimal assistance;Sitting/lateral leans   Upper Body Dressing : Set up;Sitting   Lower Body Dressing: Contact guard assist;Minimal assistance;Sitting/lateral leans   Toilet Transfer: Contact guard assist;Ambulation;Rolling walker (2 wheels) Toilet Transfer Details (indicate cue type and reason): needs max A for O2 cord management with education provded with nephew on management. Toileting- Clothing Manipulation and Hygiene: Set up;Sitting/lateral lean;Supervision/safety         General ADL Comments: nephew said that he planst to have caregiver come and assist patient with help from church friend to provide increased A at home     Vision Baseline Vision/History: 1 Wears glasses Additional Comments: patient noted to drift to the R side in hallway with cues needed to avoid obstacles on this side            Pertinent Vitals/Pain Pain Assessment Pain Assessment: No/denies pain     Extremity/Trunk Assessment Upper Extremity Assessment Upper Extremity Assessment: RUE deficits/detail RUE Deficits / Details: h/o shoulder injury for about 6 months per patient report. "they told me i am too old" when asked what MD said about this issue. patietn is able to abduct shoulder  about 40 degrees actively no FF. elbow hand and wrist WFL. noted to have edema and small skin tears preexisting on anterior elbow prior to start of session    Lower Extremity Assessment Lower Extremity Assessment: Defer to PT evaluation   Cervical / Trunk Assessment Cervical / Trunk Assessment: Kyphotic   Communication Communication Communication: No apparent difficulties   Cognition Arousal: Alert Behavior During Therapy: WFL for tasks assessed/performed Overall Cognitive Status: Within Functional Limits for tasks assessed         General Comments: patient was plesant and cooperative. patient knew she was in the hosptial. patients nephew was present during session as well.                Home Living Family/patient expects to be discharged to:: Private residence Living Arrangements: Alone Available Help at Discharge: Family;Available PRN/intermittently Type of Home: House Home Access: Ramped entrance     Home Layout: One level     Bathroom Shower/Tub: Chief Strategy Officer: Standard     Home Equipment: Cane - single point          Prior Functioning/Environment Prior Level of Function : Independent/Modified Independent;Driving             Mobility Comments: Using cane at home          OT Problem List: Decreased activity tolerance;Impaired balance (sitting and/or standing);Decreased coordination;Decreased safety awareness;Decreased knowledge of precautions;Decreased knowledge of use of DME or AE      OT Treatment/Interventions: Self-care/ADL training;Therapeutic exercise;DME and/or AE instruction;Patient/family education;Therapeutic activities;Balance training    OT Goals(Current goals can be found in the care plan section) Acute Rehab OT Goals Patient Stated Goal: to go home OT Goal Formulation: With patient/family Time For Goal Achievement: 05/22/23 Potential to Achieve Goals: Fair  OT Frequency: Min 1X/week    Co-evaluation PT/OT/SLP Co-Evaluation/Treatment: Yes Reason for Co-Treatment: To address functional/ADL transfers PT goals addressed during session: Mobility/safety with  mobility OT goals addressed during session: ADL's and self-care      AM-PAC OT "6 Clicks" Daily Activity     Outcome Measure Help from another person eating meals?: A Little Help from another person taking care of personal grooming?: A Little Help from another person toileting, which includes using toliet, bedpan, or urinal?: A Little Help from another person bathing (including washing, rinsing, drying)?: A Little Help from another person to put on and taking off regular upper body clothing?: A Little Help from another person to put on and taking off regular lower body clothing?: A Little 6 Click Score: 18   End of Session Equipment Utilized During Treatment: Gait belt;Rolling walker (2 wheels);Oxygen Nurse Communication: Mobility status  Activity Tolerance: Patient tolerated treatment well Patient left: in chair;with call bell/phone within reach;with family/visitor present;with nursing/sitter in room  OT Visit Diagnosis: Unsteadiness on feet (R26.81);Other abnormalities of gait and mobility (R26.89);Muscle weakness (generalized) (M62.81)                Time: 4098-1191 OT Time Calculation (min): 23 min Charges:  OT General Charges $OT Visit: 1 Visit OT Evaluation $OT Eval Moderate Complexity: 1 Mod  Zyden Suman OTR/L, MS Acute Rehabilitation Department Office# [phone]   Selinda Flavin 05/08/2023, 12:32 PM

## 2023-05-08 NOTE — TOC Transition Note (Signed)
Transition of Care [facility]) - CM/SW Discharge Note   Patient Details  Name: Sarah Wolf MRN: [id number] Date of Birth: May 10, 1935  Transition of Care [facility]) CM/SW Contact:  Darleene Cleaver, LCSW Phone Number: 05/08/2023, 12:58 PM   Clinical Narrative:     CSW was informed that patient will need HH PT, OT, RN, and Aide.  Patient is currently open to [facility], CSW contacted [facility], who confirmed they can continue to follow patient.  CSW was also informed by bedside nurse and attending physician that patient needs oxygen.    CSW spoke to patient's nephew Anselm Pancoast, [phone] to discuss if he wanted a different agency for [facility] and he said no.  CSW also asked if he had preference for oxygen company, he did not.  CSW was able to contact Jermaine from [place], and he can provide the oxygen for the patient.  CSW updated patient's nephew, about the name of the company, and information was also added to the AVS.    Patient will be going home with home health through [facility].  TOC signing off please reconsult with any other TOC needs, home health agency has been notified of planned discharge, patient's nephew was will transport her home to [address] [place], [place], [address].   Final next level of care: Home w Home Health Services Barriers to Discharge: Barriers Resolved   Patient Goals and CMS Choice CMS Medicare.gov Compare Post Acute Care list provided to:: Patient Represenative (must comment) Choice offered to / list presented to : [facility] POA / Guardian (Patient's nephew Bud)  Discharge Placement                         Discharge Plan and Services Additional resources added to the After Visit Summary for                  DME Arranged: Oxygen DME Agency: [employer] Date DME Agency Contacted: 05/08/23 Time DME Agency Contacted: 1145 Representative spoke with at DME Agency: Vaughan Basta HH Arranged: PT, OT, Nurse's Aide, RN HH Agency: [facility] Date [facility] Agency  Contacted: 05/08/23 Time HH Agency Contacted: 1140 Representative spoke with at [facility] Agency: [facility]  Social Determinants of Health (SDOH) Interventions SDOH Screenings   Food Insecurity: No Food Insecurity (05/04/2023)  Housing: Low Risk  (05/04/2023)  Recent Concern: Housing - Medium Risk (03/13/2023)  Transportation Needs: No Transportation Needs (05/04/2023)  Utilities: Not At Risk (05/04/2023)  Tobacco Use: Low Risk  (05/04/2023)     Readmission Risk Interventions     No data to display

## 2023-05-08 NOTE — Progress Notes (Signed)
SATURATION QUALIFICATIONS: (This note is used to comply with regulatory documentation for home oxygen)  Patient Saturations on Room Air at Rest = 86%  Patient Saturations on Room Air while Ambulating = 80%  Patient Saturations on 2 Liters of oxygen while Ambulating = 95%  Please briefly explain why patient needs home oxygen:

## 2023-05-08 NOTE — TOC CM/SW Note (Signed)
    Durable Medical Equipment  (From admission, onward)           Start     Ordered   05/08/23 1237  For home use only DME oxygen  Once       Question Answer Comment  Length of Need Lifetime   Mode or (Route) Nasal cannula   Liters per Minute 2   Frequency Continuous (stationary and portable oxygen unit needed)   Oxygen conserving device Yes   Oxygen delivery system Gas      05/08/23 1237

## 2023-05-09 ENCOUNTER — Telehealth: Payer: Self-pay

## 2023-05-09 NOTE — Telephone Encounter (Signed)
Pt nephew called to find out about the DNR forms. Please advise If this has been completed. KH

## 2023-05-09 NOTE — Telephone Encounter (Signed)
Caller & Relationship to patient:  MRN #  [id number]   Call Back Number:   Date of Last Office Visit: 05/04/2023     Date of Next Office Visit: 05/23/2023    Medication(s) to be Refilled: Pt ask if a nurse can call him (Nephew) ask some questions  Preferred Pharmacy:   ** Please notify patient to allow 48-72 hours to process** **Let patient know to contact pharmacy at the end of the day to make sure medication is ready. ** **If patient has not been seen in a year or longer, book an appointment **Advise to use MyChart for refill requests OR to contact their pharmacy

## 2023-05-10 ENCOUNTER — Telehealth: Payer: Self-pay

## 2023-05-10 DIAGNOSIS — I5032 Chronic diastolic (congestive) heart failure: Secondary | ICD-10-CM | POA: Diagnosis not present

## 2023-05-10 DIAGNOSIS — L97821 Non-pressure chronic ulcer of other part of left lower leg limited to breakdown of skin: Secondary | ICD-10-CM | POA: Diagnosis not present

## 2023-05-10 DIAGNOSIS — I872 Venous insufficiency (chronic) (peripheral): Secondary | ICD-10-CM | POA: Diagnosis not present

## 2023-05-10 DIAGNOSIS — I480 Paroxysmal atrial fibrillation: Secondary | ICD-10-CM | POA: Diagnosis not present

## 2023-05-10 DIAGNOSIS — E1151 Type 2 diabetes mellitus with diabetic peripheral angiopathy without gangrene: Secondary | ICD-10-CM | POA: Diagnosis not present

## 2023-05-10 DIAGNOSIS — I82411 Acute embolism and thrombosis of right femoral vein: Secondary | ICD-10-CM | POA: Diagnosis not present

## 2023-05-10 DIAGNOSIS — I701 Atherosclerosis of renal artery: Secondary | ICD-10-CM | POA: Diagnosis not present

## 2023-05-10 DIAGNOSIS — J9859 Other diseases of mediastinum, not elsewhere classified: Secondary | ICD-10-CM | POA: Diagnosis not present

## 2023-05-10 DIAGNOSIS — L97811 Non-pressure chronic ulcer of other part of right lower leg limited to breakdown of skin: Secondary | ICD-10-CM | POA: Diagnosis not present

## 2023-05-10 DIAGNOSIS — I5020 Unspecified systolic (congestive) heart failure: Secondary | ICD-10-CM

## 2023-05-10 DIAGNOSIS — I5023 Acute on chronic systolic (congestive) heart failure: Secondary | ICD-10-CM | POA: Diagnosis not present

## 2023-05-10 DIAGNOSIS — I70223 Atherosclerosis of native arteries of extremities with rest pain, bilateral legs: Secondary | ICD-10-CM | POA: Diagnosis not present

## 2023-05-10 DIAGNOSIS — I82521 Chronic embolism and thrombosis of right iliac vein: Secondary | ICD-10-CM | POA: Diagnosis not present

## 2023-05-10 DIAGNOSIS — S90822D Blister (nonthermal), left foot, subsequent encounter: Secondary | ICD-10-CM | POA: Diagnosis not present

## 2023-05-10 DIAGNOSIS — I110 Hypertensive heart disease with heart failure: Secondary | ICD-10-CM | POA: Diagnosis not present

## 2023-05-10 DIAGNOSIS — I82432 Acute embolism and thrombosis of left popliteal vein: Secondary | ICD-10-CM | POA: Diagnosis not present

## 2023-05-10 DIAGNOSIS — J9601 Acute respiratory failure with hypoxia: Secondary | ICD-10-CM

## 2023-05-10 DIAGNOSIS — K551 Chronic vascular disorders of intestine: Secondary | ICD-10-CM | POA: Diagnosis not present

## 2023-05-10 NOTE — Telephone Encounter (Signed)
Please view and sign order. Also advise because it will need to be faxed to [facility]  @ [phone]. If questions they can be call [phone].

## 2023-05-10 NOTE — Telephone Encounter (Signed)
Done Kh 

## 2023-05-11 DIAGNOSIS — I701 Atherosclerosis of renal artery: Secondary | ICD-10-CM | POA: Diagnosis not present

## 2023-05-11 DIAGNOSIS — I82411 Acute embolism and thrombosis of right femoral vein: Secondary | ICD-10-CM | POA: Diagnosis not present

## 2023-05-11 DIAGNOSIS — I82432 Acute embolism and thrombosis of left popliteal vein: Secondary | ICD-10-CM | POA: Diagnosis not present

## 2023-05-11 DIAGNOSIS — L97821 Non-pressure chronic ulcer of other part of left lower leg limited to breakdown of skin: Secondary | ICD-10-CM | POA: Diagnosis not present

## 2023-05-11 DIAGNOSIS — E1151 Type 2 diabetes mellitus with diabetic peripheral angiopathy without gangrene: Secondary | ICD-10-CM | POA: Diagnosis not present

## 2023-05-11 DIAGNOSIS — J9859 Other diseases of mediastinum, not elsewhere classified: Secondary | ICD-10-CM | POA: Diagnosis not present

## 2023-05-11 DIAGNOSIS — I82521 Chronic embolism and thrombosis of right iliac vein: Secondary | ICD-10-CM | POA: Diagnosis not present

## 2023-05-11 DIAGNOSIS — I480 Paroxysmal atrial fibrillation: Secondary | ICD-10-CM | POA: Diagnosis not present

## 2023-05-11 DIAGNOSIS — I5023 Acute on chronic systolic (congestive) heart failure: Secondary | ICD-10-CM | POA: Diagnosis not present

## 2023-05-11 DIAGNOSIS — I872 Venous insufficiency (chronic) (peripheral): Secondary | ICD-10-CM | POA: Diagnosis not present

## 2023-05-11 DIAGNOSIS — I70223 Atherosclerosis of native arteries of extremities with rest pain, bilateral legs: Secondary | ICD-10-CM | POA: Diagnosis not present

## 2023-05-11 DIAGNOSIS — L97811 Non-pressure chronic ulcer of other part of right lower leg limited to breakdown of skin: Secondary | ICD-10-CM | POA: Diagnosis not present

## 2023-05-11 DIAGNOSIS — I5032 Chronic diastolic (congestive) heart failure: Secondary | ICD-10-CM | POA: Diagnosis not present

## 2023-05-11 DIAGNOSIS — K551 Chronic vascular disorders of intestine: Secondary | ICD-10-CM | POA: Diagnosis not present

## 2023-05-11 DIAGNOSIS — S90822D Blister (nonthermal), left foot, subsequent encounter: Secondary | ICD-10-CM | POA: Diagnosis not present

## 2023-05-11 DIAGNOSIS — I110 Hypertensive heart disease with heart failure: Secondary | ICD-10-CM | POA: Diagnosis not present

## 2023-05-11 NOTE — Discharge Summary (Signed)
FV Mid   Full                                                        +---------+---------------+---------+-----------+----------+--------------+ FV DistalFull                                                        +---------+---------------+---------+-----------+----------+--------------+ PFV      Full                                                        +---------+---------------+---------+-----------+----------+--------------+ POP      Full           Yes      Yes                                 +---------+---------------+---------+-----------+----------+--------------+ PTV      Full                                                        +---------+---------------+---------+-----------+----------+--------------+ PERO     Full                                                        +---------+---------------+---------+-----------+----------+--------------+     Summary: BILATERAL: - No evidence of deep vein thrombosis seen in the lower extremities, bilaterally. -No evidence of popliteal cyst, bilaterally.   *See table(s) above for measurements and observations. Electronically signed by Sherald Hess MD on 05/05/2023 at 4:23:23 PM.    Final    DG Chest 2 View  Result Date: 05/04/2023 CLINICAL DATA:  Peripheral edema evaluation EXAM: CHEST - 2 VIEW COMPARISON:  Radiograph 03/26/2023 FINDINGS: Low lung volumes. Stable cardiomediastinal silhouette. Aortic atherosclerotic calcification. Elevated left hemidiaphragm. Small bilateral pleural effusions and associated airspace opacities similar to  prior. Pulmonary vascular congestion. IMPRESSION: Small bilateral pleural effusions and associated airspace opacities similar to prior. Electronically Signed   By: Minerva Fester M.D.   On: 05/04/2023 16:26   VAS [place] ABI WITH/WO TBI  Result Date: 05/03/2023  LOWER EXTREMITY DOPPLER STUDY Patient Name:  Sarah Wolf  Date of Exam:   05/03/2023 Medical Rec #: [id number]   Accession #:    [id number] Date of Birth: 03-Apr-1935   Patient Gender: F Patient Age:   88 years Exam Location:  [facility] Procedure:      VAS [place] ABI WITH/WO TBI Referring Phys: Lemar Livings --------------------------------------------------------------------------------  Indications: Peripheral artery disease. High Risk Factors: Hyperlipidemia.  Vascular Interventions: 03/15/23 thrombolysis right lower extremity  FV Mid   Full                                                        +---------+---------------+---------+-----------+----------+--------------+ FV DistalFull                                                        +---------+---------------+---------+-----------+----------+--------------+ PFV      Full                                                        +---------+---------------+---------+-----------+----------+--------------+ POP      Full           Yes      Yes                                 +---------+---------------+---------+-----------+----------+--------------+ PTV      Full                                                        +---------+---------------+---------+-----------+----------+--------------+ PERO     Full                                                        +---------+---------------+---------+-----------+----------+--------------+     Summary: BILATERAL: - No evidence of deep vein thrombosis seen in the lower extremities, bilaterally. -No evidence of popliteal cyst, bilaterally.   *See table(s) above for measurements and observations. Electronically signed by Sherald Hess MD on 05/05/2023 at 4:23:23 PM.    Final    DG Chest 2 View  Result Date: 05/04/2023 CLINICAL DATA:  Peripheral edema evaluation EXAM: CHEST - 2 VIEW COMPARISON:  Radiograph 03/26/2023 FINDINGS: Low lung volumes. Stable cardiomediastinal silhouette. Aortic atherosclerotic calcification. Elevated left hemidiaphragm. Small bilateral pleural effusions and associated airspace opacities similar to  prior. Pulmonary vascular congestion. IMPRESSION: Small bilateral pleural effusions and associated airspace opacities similar to prior. Electronically Signed   By: Minerva Fester M.D.   On: 05/04/2023 16:26   VAS [place] ABI WITH/WO TBI  Result Date: 05/03/2023  LOWER EXTREMITY DOPPLER STUDY Patient Name:  Sarah Wolf  Date of Exam:   05/03/2023 Medical Rec #: [id number]   Accession #:    [id number] Date of Birth: 03-Apr-1935   Patient Gender: F Patient Age:   88 years Exam Location:  [facility] Procedure:      VAS [place] ABI WITH/WO TBI Referring Phys: Lemar Livings --------------------------------------------------------------------------------  Indications: Peripheral artery disease. High Risk Factors: Hyperlipidemia.  Vascular Interventions: 03/15/23 thrombolysis right lower extremity  28.3 ml LV SV MOD A4C:     32.8 ml LV SV MOD BP:      21.2 ml RIGHT VENTRICLE         IVC TAPSE (M-mode): 1.6 cm  IVC diam: 2.20 cm LEFT ATRIUM              Index        RIGHT  ATRIUM           Index LA diam:        3.70 cm  2.16 cm/m   RA Area:     18.70 cm LA Vol (A2C):   109.0 ml 63.78 ml/m  RA Volume:   49.70 ml  29.08 ml/m LA Vol (A4C):   59.7 ml  34.93 ml/m LA Biplane Vol: 80.9 ml  47.33 ml/m  AORTIC VALVE LVOT Vmax:   103.00 cm/s LVOT Vmean:  65.600 cm/s LVOT VTI:    0.176 m  AORTA Ao Root diam: 2.60 cm Ao Asc diam:  3.60 cm MITRAL VALVE                TRICUSPID VALVE MV Area (PHT): 4.68 cm     TR Peak grad:   38.9 mmHg MV Area VTI:   2.36 cm     TR Vmax:        312.00 cm/s MV Peak grad:  6.7 mmHg MV Mean grad:  2.0 mmHg     SHUNTS MV Vmax:       1.29 m/s     Systemic VTI:  0.18 m MV Vmean:      67.5 cm/s    Systemic Diam: 2.10 cm MV Decel Time: 162 msec MV E velocity: 138.00 cm/s Epifanio Lesches MD Electronically signed by Epifanio Lesches MD Signature Date/Time: 05/05/2023/6:29:16 PM    Final    VAS [place] LOWER EXTREMITY VENOUS (DVT)  Result Date: 05/05/2023  Lower Venous DVT Study Patient Name:  Sarah Wolf  Date of Exam:   05/05/2023 Medical Rec #: [id number]   Accession #:    [id number] Date of Birth: May 17, 1935   Patient Gender: F Patient Age:   88 years Exam Location:  [facility] Procedure:      VAS [place] LOWER EXTREMITY VENOUS (DVT) Referring Phys: [facility] GOEL --------------------------------------------------------------------------------  Indications: Swelling, and Edema.  Risk Factors: DVT Hx past pregnancy. Limitations: Poor ultrasound/tissue interface. Comparison Study: No significant changes seen since prior exam 03/13/23 Performing Technologist: Shona Simpson  Examination Guidelines: A complete evaluation includes B-mode imaging, spectral Doppler, color Doppler, and power Doppler as needed of all accessible portions of each vessel. Bilateral testing is considered an integral part of a complete examination. Limited examinations for reoccurring indications may be performed as noted. The reflux portion of the exam is performed with the patient in  reverse Trendelenburg.  +---------+---------------+---------+-----------+----------+--------------+ RIGHT    CompressibilityPhasicitySpontaneityPropertiesThrombus Aging +---------+---------------+---------+-----------+----------+--------------+ CFV      Full           Yes      Yes                                 +---------+---------------+---------+-----------+----------+--------------+ SFJ      Full                                                        +---------+---------------+---------+-----------+----------+--------------+  Physician Discharge Summary   Patient: Sarah Wolf MRN: [id number] DOB: 30-Sep-1934  Admit date:     05/04/2023  Discharge date: 05/08/2023  Discharge Physician: Lynden Oxford  PCP: Ivonne Andrew, NP  Recommendations at discharge: Follow-up with PCP in 1 week with BMP.   Follow-up Information     Ivonne Andrew, NP. Schedule an appointment as soon as possible for a visit in 1 week(s).   Specialties: Pulmonary Disease, Endocrinology Why: with BMP lab to look at kidney and electrolytes Contact information: [address] [place] [address] [phone]         [facility], [facility] Follow up.   Specialty: Home Health Services Why: [facility] will continue providing home health physical therapy, occupational therapy, registered nurse, aide, and a speech therapist.  Give them a call if you have any questions. Contact information: [address] [place] [address] [phone]         [facility] Follow up.   Why: [facility] will be the provider for your oxygen.  Give them a call if you have any questions. Contact information: [address],  [place], [place] [address],  [phone]               Discharge Diagnoses: Principal Problem:   Acute respiratory failure with hypoxia (HCC) Active Problems:   Critical limb ischemia of right lower extremity (HCC)   Persistent atrial fibrillation (HCC)   Heart failure with mildly reduced ejection fraction (HFmrEF, 41-49%) (HCC)   History of DVT (deep vein thrombosis)   Lesion of mediastinum   Aspiration into airway   History of GI bleed   Anticoagulated   Esophagitis   PAD (peripheral artery disease) (HCC)  Brief hospital course: PMH of HTN, PAD, diastolic CHF presented to hospital with complaints of shortness of breath as well as volume overload. Treated with IV diuresis.   Assessment and Plan: Acute hypoxic respiratory failure. Large hiatal hernia. Patient  presents with actually hypoxia. Suspected to have acute on chronic combined systolic and diastolic CHF and was treated with IV diuresis. Despite aggressive diuresis and being euvolemic patient remained persistently hypoxic.  A CT chest was performed to ensure there is no acute abnormality. While the CT chest does show component of possible basilar effusion as well as right-sided infiltrate most likely the patient is actually suffering from hypoxia from the effect of large hiatal hernia and complications from that. No evidence of acute pneumonia based on clinical examination. Patient was euvolemic based on clinical examination. At present recommend to continue oxygen on discharge. Also recommend incentive spirometry. Do not think the patient is a great candidate for any surgical intervention like gastropexy for the hiatal hernia but if the family desires further workup a surgical referral might be indicated.  Acute on chronic combined systolic and diastolic CHF. Treated with IV diuresis.  Now appears to be euvolemic and the creatinine also has bumped up therefore diuresis was on hold.  Creatinine improving.  Will resume home regimen. Echocardiogram in August shows EF 40%.   Persistent A-fib. On Eliquis, amiodarone as well as metoprolol. Was in A-fib at the time of admission.    PUD. GERD. Multiple bleeding ulcers. On anticoagulation. For now we will monitor.   PAD. Recently admitted for occlusion of the right SFA and left popliteal artery. Underwent angiogram with SFA stent. On Eliquis.   Reported history of DVT. On dual anticoagulation. For now we will monitor.   Type  and/or kV according to patient size and/or use of iterative reconstruction technique. COMPARISON:  Chest radiograph 05/07/2023.  CT chest 03/13/2023 FINDINGS: Cardiovascular: Mild cardiac enlargement. Minimal pericardial effusion. Normal caliber thoracic aorta. Calcification of the aorta and coronary arteries. Mediastinum/Nodes: Thyroid gland is unremarkable. Esophagus is decompressed. Esophageal and left diaphragmatic hernia with most of the stomach in the chest. Scattered lymph nodes are not pathologically enlarged, likely reactive. Lungs/Pleura: Moderate bilateral pleural effusions with basilar atelectasis or consolidation, greater on the left. Changes could represent compressive atelectasis or pneumonia. Consolidations are increased since prior study. No pneumothorax. Upper Abdomen:  Several liver cysts are demonstrated without change since prior study. Vascular calcifications. No acute abnormalities. Edema demonstrated in the subcutaneous fat of the chest and upper abdomen. Musculoskeletal: Degenerative changes in the spine and shoulders. IMPRESSION: 1. Cardiac enlargement with small pericardial effusion. 2. Moderate bilateral pleural effusions with basilar consolidation, possibly atelectasis or pneumonia. Consolidative changes are increasing since prior study. 3. Esophageal and left diaphragmatic hernia with most of the stomach in the chest. 4. Aortic atherosclerosis. Electronically Signed   By: Burman Nieves M.D.   On: 05/07/2023 22:48   DG Chest 2 View  Result Date: 05/07/2023 CLINICAL DATA:  Pleural effusions EXAM: CHEST - 2 VIEW COMPARISON:  Chest x-ray dated May 04, 2023 FINDINGS: Unchanged cardiomegaly. Similar large hiatal hernia. Bibasilar atelectasis. Small left pleural effusion. Similar mild right upper lobe opacity. No evidence of pneumothorax. IMPRESSION: 1. Similar small left pleural effusion. 2. Large left sided hernia. 3. Unchanged mild right upper lobe opacity which could be due to atelectasis, infection or aspiration. 4. Cardiomegaly. Electronically Signed   By: Allegra Lai M.D.   On: 05/07/2023 15:41   ECHOCARDIOGRAM COMPLETE  Result Date: 05/05/2023    ECHOCARDIOGRAM REPORT   Patient Name:   Sarah Wolf Date of Exam: 05/05/2023 Medical Rec #:  [id number]  Height:       62.0 in Accession #:    [id number] Weight:       153.7 lb Date of Birth:  01/21/1935  BSA:          1.709 m Patient Age:    88 years   BP:           137/76 mmHg Patient Gender: F          HR:           85 bpm. Exam Location:  Inpatient Procedure: 2D Echo, 3D Echo, Cardiac Doppler and Color Doppler Indications:    I50.40* Unspecified combined systolic (congestive) and diastolic                 (congestive) heart failure  History:        Patient has prior history of Echocardiogram  examinations, most                 recent 03/16/2023. CHF, Abnormal ECG, Arrythmias:Atrial                 Fibrillation, Signs/Symptoms:Shortness of Breath and Dyspnea;                 Risk Factors:Dyslipidemia. Pericardial effusion.  Sonographer:    Sheralyn Boatman RDCS Referring Phys: [id number] [facility] GOEL IMPRESSIONS  1. Left ventricular ejection fraction, by estimation, is 50 to 55%. The left ventricle has low normal function. The left ventricle demonstrates global hypokinesis. Left ventricular diastolic parameters are indeterminate.  2. Right ventricular systolic function is mildly reduced. The right ventricular size is normal. There is moderately elevated  Physician Discharge Summary   Patient: Sarah Wolf MRN: [id number] DOB: 30-Sep-1934  Admit date:     05/04/2023  Discharge date: 05/08/2023  Discharge Physician: Lynden Oxford  PCP: Ivonne Andrew, NP  Recommendations at discharge: Follow-up with PCP in 1 week with BMP.   Follow-up Information     Ivonne Andrew, NP. Schedule an appointment as soon as possible for a visit in 1 week(s).   Specialties: Pulmonary Disease, Endocrinology Why: with BMP lab to look at kidney and electrolytes Contact information: [address] [place] [address] [phone]         Care, [facility] Follow up.   Specialty: Home Health Services Why: [facility] will continue providing home health physical therapy, occupational therapy, registered nurse, aide, and a speech therapist.  Give them a call if you have any questions. Contact information: [address] [place] [address] [phone]         [facility] Follow up.   Why: [employer] will be the provider for your oxygen.  Give them a call if you have any questions. Contact information: [address],  [place], [place] [address],  [phone]               Discharge Diagnoses: Principal Problem:   Acute respiratory failure with hypoxia (HCC) Active Problems:   Critical limb ischemia of right lower extremity (HCC)   Persistent atrial fibrillation (HCC)   Heart failure with mildly reduced ejection fraction (HFmrEF, 41-49%) (HCC)   History of DVT (deep vein thrombosis)   Lesion of mediastinum   Aspiration into airway   History of GI bleed   Anticoagulated   Esophagitis   PAD (peripheral artery disease) (HCC)  Brief hospital course: PMH of HTN, PAD, diastolic CHF presented to hospital with complaints of shortness of breath as well as volume overload. Treated with IV diuresis.   Assessment and Plan: Acute hypoxic respiratory failure. Large hiatal hernia. Patient  presents with actually hypoxia. Suspected to have acute on chronic combined systolic and diastolic CHF and was treated with IV diuresis. Despite aggressive diuresis and being euvolemic patient remained persistently hypoxic.  A CT chest was performed to ensure there is no acute abnormality. While the CT chest does show component of possible basilar effusion as well as right-sided infiltrate most likely the patient is actually suffering from hypoxia from the effect of large hiatal hernia and complications from that. No evidence of acute pneumonia based on clinical examination. Patient was euvolemic based on clinical examination. At present recommend to continue oxygen on discharge. Also recommend incentive spirometry. Do not think the patient is a great candidate for any surgical intervention like gastropexy for the hiatal hernia but if the family desires further workup a surgical referral might be indicated.  Acute on chronic combined systolic and diastolic CHF. Treated with IV diuresis.  Now appears to be euvolemic and the creatinine also has bumped up therefore diuresis was on hold.  Creatinine improving.  Will resume home regimen. Echocardiogram in August shows EF 40%.   Persistent A-fib. On Eliquis, amiodarone as well as metoprolol. Was in A-fib at the time of admission.    PUD. GERD. Multiple bleeding ulcers. On anticoagulation. For now we will monitor.   PAD. Recently admitted for occlusion of the right SFA and left popliteal artery. Underwent angiogram with SFA stent. On Eliquis.   Reported history of DVT. On dual anticoagulation. For now we will monitor.   Type  Physician Discharge Summary   Patient: Sarah Wolf MRN: [id number] DOB: 30-Sep-1934  Admit date:     05/04/2023  Discharge date: 05/08/2023  Discharge Physician: Lynden Oxford  PCP: Ivonne Andrew, NP  Recommendations at discharge: Follow-up with PCP in 1 week with BMP.   Follow-up Information     Ivonne Andrew, NP. Schedule an appointment as soon as possible for a visit in 1 week(s).   Specialties: Pulmonary Disease, Endocrinology Why: with BMP lab to look at kidney and electrolytes Contact information: [address] [place] [address] [phone]         [facility], [facility] Follow up.   Specialty: Home Health Services Why: [facility] will continue providing home health physical therapy, occupational therapy, registered nurse, aide, and a speech therapist.  Give them a call if you have any questions. Contact information: [address] [place] [address] [phone]         [facility] Follow up.   Why: [employer] will be the provider for your oxygen.  Give them a call if you have any questions. Contact information: [address],  [place], [place] [address],  [phone]               Discharge Diagnoses: Principal Problem:   Acute respiratory failure with hypoxia (HCC) Active Problems:   Critical limb ischemia of right lower extremity (HCC)   Persistent atrial fibrillation (HCC)   Heart failure with mildly reduced ejection fraction (HFmrEF, 41-49%) (HCC)   History of DVT (deep vein thrombosis)   Lesion of mediastinum   Aspiration into airway   History of GI bleed   Anticoagulated   Esophagitis   PAD (peripheral artery disease) (HCC)  Brief hospital course: PMH of HTN, PAD, diastolic CHF presented to hospital with complaints of shortness of breath as well as volume overload. Treated with IV diuresis.   Assessment and Plan: Acute hypoxic respiratory failure. Large hiatal hernia. Patient  presents with actually hypoxia. Suspected to have acute on chronic combined systolic and diastolic CHF and was treated with IV diuresis. Despite aggressive diuresis and being euvolemic patient remained persistently hypoxic.  A CT chest was performed to ensure there is no acute abnormality. While the CT chest does show component of possible basilar effusion as well as right-sided infiltrate most likely the patient is actually suffering from hypoxia from the effect of large hiatal hernia and complications from that. No evidence of acute pneumonia based on clinical examination. Patient was euvolemic based on clinical examination. At present recommend to continue oxygen on discharge. Also recommend incentive spirometry. Do not think the patient is a great candidate for any surgical intervention like gastropexy for the hiatal hernia but if the family desires further workup a surgical referral might be indicated.  Acute on chronic combined systolic and diastolic CHF. Treated with IV diuresis.  Now appears to be euvolemic and the creatinine also has bumped up therefore diuresis was on hold.  Creatinine improving.  Will resume home regimen. Echocardiogram in August shows EF 40%.   Persistent A-fib. On Eliquis, amiodarone as well as metoprolol. Was in A-fib at the time of admission.    PUD. GERD. Multiple bleeding ulcers. On anticoagulation. For now we will monitor.   PAD. Recently admitted for occlusion of the right SFA and left popliteal artery. Underwent angiogram with SFA stent. On Eliquis.   Reported history of DVT. On dual anticoagulation. For now we will monitor.   Type  PTA                                    Not evaluated +--------+------------------+-----+--------+-------------+ DP                                     Not evaluated +--------+------------------+-----+--------+-------------+ +-------+-------------+-------------+------------+------------+ ABI/TBIToday's ABI  Today's TBI  Previous ABIPrevious TBI +-------+-------------+-------------+------------+------------+ Right  1.09         0.00         0.00        0.00         +-------+-------------+-------------+------------+------------+ Left   Not evaluatedNot  evaluated0.56        0.15         +-------+-------------+-------------+------------+------------+   Summary: Right: Resting right ankle-brachial index is within normal range. The right toe-brachial index is abnormal. *See table(s) above for measurements and observations.  Electronically signed by Lemar Livings MD on 05/03/2023 at 10:56:56 AM.    Final    VAS [place] LOWER EXTREMITY ARTERIAL DUPLEX  Result Date: 05/03/2023 LOWER EXTREMITY ARTERIAL DUPLEX STUDY Patient Name:  Sarah Wolf  Date of Exam:   05/03/2023 Medical Rec #: [id number]   Accession #:    [id number] Date of Birth: September 18, 1934   Patient Gender: F Patient Age:   26 years Exam Location:  [facility] Procedure:      VAS [place] LOWER EXTREMITY ARTERIAL DUPLEX Referring Phys: Lemar Livings --------------------------------------------------------------------------------  Indications: Peripheral artery disease. High Risk Factors: Hyperlipidemia.  Vascular Interventions: 03/15/23 thrombolysis right lower extremity                         03/16/23 right SFA stenting with angioplasty x2 (6 mm x 5                         cm Viabahn and 6 mm x 40 drug-coated Eluvia), right                         lower extremity arteriogram. Current ABI:            right=1.09/0.00, left= not evaluated secondary to                         limitations Comparison Study: 03/14/23 Lower extremity arterial duplex- Right: Total                   occlusion noted in the superficial femoral artery.                   Reconstitution of the distal SFA via collateral. Dampened flow                   noted in the posterior tibial and anterior tibial ateries.                   Unable to detect flow in the peroneal artery.                    Left: Total occlusion noted in the popliteal artery.                   Reconstitution of the proximal PTA via collateral. Monophasic  Physician Discharge Summary   Patient: Sarah Wolf MRN: [id number] DOB: 30-Sep-1934  Admit date:     05/04/2023  Discharge date: 05/08/2023  Discharge Physician: Lynden Oxford  PCP: Ivonne Andrew, NP  Recommendations at discharge: Follow-up with PCP in 1 week with BMP.   Follow-up Information     Ivonne Andrew, NP. Schedule an appointment as soon as possible for a visit in 1 week(s).   Specialties: Pulmonary Disease, Endocrinology Why: with BMP lab to look at kidney and electrolytes Contact information: [address] [place] [address] [phone]         Care, [facility] Follow up.   Specialty: Home Health Services Why: [facility] will continue providing home health physical therapy, occupational therapy, registered nurse, aide, and a speech therapist.  Give them a call if you have any questions. Contact information: [address] [place] [address] [phone]         [facility] Follow up.   Why: [facility] will be the provider for your oxygen.  Give them a call if you have any questions. Contact information: [address],  [place], [place] [address],  [phone]               Discharge Diagnoses: Principal Problem:   Acute respiratory failure with hypoxia (HCC) Active Problems:   Critical limb ischemia of right lower extremity (HCC)   Persistent atrial fibrillation (HCC)   Heart failure with mildly reduced ejection fraction (HFmrEF, 41-49%) (HCC)   History of DVT (deep vein thrombosis)   Lesion of mediastinum   Aspiration into airway   History of GI bleed   Anticoagulated   Esophagitis   PAD (peripheral artery disease) (HCC)  Brief hospital course: PMH of HTN, PAD, diastolic CHF presented to hospital with complaints of shortness of breath as well as volume overload. Treated with IV diuresis.   Assessment and Plan: Acute hypoxic respiratory failure. Large hiatal hernia. Patient  presents with actually hypoxia. Suspected to have acute on chronic combined systolic and diastolic CHF and was treated with IV diuresis. Despite aggressive diuresis and being euvolemic patient remained persistently hypoxic.  A CT chest was performed to ensure there is no acute abnormality. While the CT chest does show component of possible basilar effusion as well as right-sided infiltrate most likely the patient is actually suffering from hypoxia from the effect of large hiatal hernia and complications from that. No evidence of acute pneumonia based on clinical examination. Patient was euvolemic based on clinical examination. At present recommend to continue oxygen on discharge. Also recommend incentive spirometry. Do not think the patient is a great candidate for any surgical intervention like gastropexy for the hiatal hernia but if the family desires further workup a surgical referral might be indicated.  Acute on chronic combined systolic and diastolic CHF. Treated with IV diuresis.  Now appears to be euvolemic and the creatinine also has bumped up therefore diuresis was on hold.  Creatinine improving.  Will resume home regimen. Echocardiogram in August shows EF 40%.   Persistent A-fib. On Eliquis, amiodarone as well as metoprolol. Was in A-fib at the time of admission.    PUD. GERD. Multiple bleeding ulcers. On anticoagulation. For now we will monitor.   PAD. Recently admitted for occlusion of the right SFA and left popliteal artery. Underwent angiogram with SFA stent. On Eliquis.   Reported history of DVT. On dual anticoagulation. For now we will monitor.   Type  28.3 ml LV SV MOD A4C:     32.8 ml LV SV MOD BP:      21.2 ml RIGHT VENTRICLE         IVC TAPSE (M-mode): 1.6 cm  IVC diam: 2.20 cm LEFT ATRIUM              Index        RIGHT  ATRIUM           Index LA diam:        3.70 cm  2.16 cm/m   RA Area:     18.70 cm LA Vol (A2C):   109.0 ml 63.78 ml/m  RA Volume:   49.70 ml  29.08 ml/m LA Vol (A4C):   59.7 ml  34.93 ml/m LA Biplane Vol: 80.9 ml  47.33 ml/m  AORTIC VALVE LVOT Vmax:   103.00 cm/s LVOT Vmean:  65.600 cm/s LVOT VTI:    0.176 m  AORTA Ao Root diam: 2.60 cm Ao Asc diam:  3.60 cm MITRAL VALVE                TRICUSPID VALVE MV Area (PHT): 4.68 cm     TR Peak grad:   38.9 mmHg MV Area VTI:   2.36 cm     TR Vmax:        312.00 cm/s MV Peak grad:  6.7 mmHg MV Mean grad:  2.0 mmHg     SHUNTS MV Vmax:       1.29 m/s     Systemic VTI:  0.18 m MV Vmean:      67.5 cm/s    Systemic Diam: 2.10 cm MV Decel Time: 162 msec MV E velocity: 138.00 cm/s Epifanio Lesches MD Electronically signed by Epifanio Lesches MD Signature Date/Time: 05/05/2023/6:29:16 PM    Final    VAS [place] LOWER EXTREMITY VENOUS (DVT)  Result Date: 05/05/2023  Lower Venous DVT Study Patient Name:  Sarah Wolf  Date of Exam:   05/05/2023 Medical Rec #: [id number]   Accession #:    [id number] Date of Birth: May 17, 1935   Patient Gender: F Patient Age:   88 years Exam Location:  [facility] Procedure:      VAS [place] LOWER EXTREMITY VENOUS (DVT) Referring Phys: [facility] GOEL --------------------------------------------------------------------------------  Indications: Swelling, and Edema.  Risk Factors: DVT Hx past pregnancy. Limitations: Poor ultrasound/tissue interface. Comparison Study: No significant changes seen since prior exam 03/13/23 Performing Technologist: Shona Simpson  Examination Guidelines: A complete evaluation includes B-mode imaging, spectral Doppler, color Doppler, and power Doppler as needed of all accessible portions of each vessel. Bilateral testing is considered an integral part of a complete examination. Limited examinations for reoccurring indications may be performed as noted. The reflux portion of the exam is performed with the patient in  reverse Trendelenburg.  +---------+---------------+---------+-----------+----------+--------------+ RIGHT    CompressibilityPhasicitySpontaneityPropertiesThrombus Aging +---------+---------------+---------+-----------+----------+--------------+ CFV      Full           Yes      Yes                                 +---------+---------------+---------+-----------+----------+--------------+ SFJ      Full                                                        +---------+---------------+---------+-----------+----------+--------------+  PTA                                    Not evaluated +--------+------------------+-----+--------+-------------+ DP                                     Not evaluated +--------+------------------+-----+--------+-------------+ +-------+-------------+-------------+------------+------------+ ABI/TBIToday's ABI  Today's TBI  Previous ABIPrevious TBI +-------+-------------+-------------+------------+------------+ Right  1.09         0.00         0.00        0.00         +-------+-------------+-------------+------------+------------+ Left   Not evaluatedNot  evaluated0.56        0.15         +-------+-------------+-------------+------------+------------+   Summary: Right: Resting right ankle-brachial index is within normal range. The right toe-brachial index is abnormal. *See table(s) above for measurements and observations.  Electronically signed by Lemar Livings MD on 05/03/2023 at 10:56:56 AM.    Final    VAS [place] LOWER EXTREMITY ARTERIAL DUPLEX  Result Date: 05/03/2023 LOWER EXTREMITY ARTERIAL DUPLEX STUDY Patient Name:  Sarah Wolf  Date of Exam:   05/03/2023 Medical Rec #: [id number]   Accession #:    [id number] Date of Birth: September 18, 1934   Patient Gender: F Patient Age:   26 years Exam Location:  [facility] Procedure:      VAS [place] LOWER EXTREMITY ARTERIAL DUPLEX Referring Phys: Lemar Livings --------------------------------------------------------------------------------  Indications: Peripheral artery disease. High Risk Factors: Hyperlipidemia.  Vascular Interventions: 03/15/23 thrombolysis right lower extremity                         03/16/23 right SFA stenting with angioplasty x2 (6 mm x 5                         cm Viabahn and 6 mm x 40 drug-coated Eluvia), right                         lower extremity arteriogram. Current ABI:            right=1.09/0.00, left= not evaluated secondary to                         limitations Comparison Study: 03/14/23 Lower extremity arterial duplex- Right: Total                   occlusion noted in the superficial femoral artery.                   Reconstitution of the distal SFA via collateral. Dampened flow                   noted in the posterior tibial and anterior tibial ateries.                   Unable to detect flow in the peroneal artery.                    Left: Total occlusion noted in the popliteal artery.                   Reconstitution of the proximal PTA via collateral. Monophasic  FV Mid   Full                                                        +---------+---------------+---------+-----------+----------+--------------+ FV DistalFull                                                        +---------+---------------+---------+-----------+----------+--------------+ PFV      Full                                                        +---------+---------------+---------+-----------+----------+--------------+ POP      Full           Yes      Yes                                 +---------+---------------+---------+-----------+----------+--------------+ PTV      Full                                                        +---------+---------------+---------+-----------+----------+--------------+ PERO     Full                                                        +---------+---------------+---------+-----------+----------+--------------+     Summary: BILATERAL: - No evidence of deep vein thrombosis seen in the lower extremities, bilaterally. -No evidence of popliteal cyst, bilaterally.   *See table(s) above for measurements and observations. Electronically signed by Sherald Hess MD on 05/05/2023 at 4:23:23 PM.    Final    DG Chest 2 View  Result Date: 05/04/2023 CLINICAL DATA:  Peripheral edema evaluation EXAM: CHEST - 2 VIEW COMPARISON:  Radiograph 03/26/2023 FINDINGS: Low lung volumes. Stable cardiomediastinal silhouette. Aortic atherosclerotic calcification. Elevated left hemidiaphragm. Small bilateral pleural effusions and associated airspace opacities similar to  prior. Pulmonary vascular congestion. IMPRESSION: Small bilateral pleural effusions and associated airspace opacities similar to prior. Electronically Signed   By: Minerva Fester M.D.   On: 05/04/2023 16:26   VAS [place] ABI WITH/WO TBI  Result Date: 05/03/2023  LOWER EXTREMITY DOPPLER STUDY Patient Name:  Sarah Wolf  Date of Exam:   05/03/2023 Medical Rec #: [id number]   Accession #:    [id number] Date of Birth: 03-Apr-1935   Patient Gender: F Patient Age:   88 years Exam Location:  [facility] Procedure:      VAS [place] ABI WITH/WO TBI Referring Phys: Lemar Livings --------------------------------------------------------------------------------  Indications: Peripheral artery disease. High Risk Factors: Hyperlipidemia.  Vascular Interventions: 03/15/23 thrombolysis right lower extremity  28.3 ml LV SV MOD A4C:     32.8 ml LV SV MOD BP:      21.2 ml RIGHT VENTRICLE         IVC TAPSE (M-mode): 1.6 cm  IVC diam: 2.20 cm LEFT ATRIUM              Index        RIGHT  ATRIUM           Index LA diam:        3.70 cm  2.16 cm/m   RA Area:     18.70 cm LA Vol (A2C):   109.0 ml 63.78 ml/m  RA Volume:   49.70 ml  29.08 ml/m LA Vol (A4C):   59.7 ml  34.93 ml/m LA Biplane Vol: 80.9 ml  47.33 ml/m  AORTIC VALVE LVOT Vmax:   103.00 cm/s LVOT Vmean:  65.600 cm/s LVOT VTI:    0.176 m  AORTA Ao Root diam: 2.60 cm Ao Asc diam:  3.60 cm MITRAL VALVE                TRICUSPID VALVE MV Area (PHT): 4.68 cm     TR Peak grad:   38.9 mmHg MV Area VTI:   2.36 cm     TR Vmax:        312.00 cm/s MV Peak grad:  6.7 mmHg MV Mean grad:  2.0 mmHg     SHUNTS MV Vmax:       1.29 m/s     Systemic VTI:  0.18 m MV Vmean:      67.5 cm/s    Systemic Diam: 2.10 cm MV Decel Time: 162 msec MV E velocity: 138.00 cm/s Epifanio Lesches MD Electronically signed by Epifanio Lesches MD Signature Date/Time: 05/05/2023/6:29:16 PM    Final    VAS [place] LOWER EXTREMITY VENOUS (DVT)  Result Date: 05/05/2023  Lower Venous DVT Study Patient Name:  Sarah Wolf  Date of Exam:   05/05/2023 Medical Rec #: [id number]   Accession #:    [id number] Date of Birth: May 17, 1935   Patient Gender: F Patient Age:   88 years Exam Location:  [facility] Procedure:      VAS [place] LOWER EXTREMITY VENOUS (DVT) Referring Phys: [facility] GOEL --------------------------------------------------------------------------------  Indications: Swelling, and Edema.  Risk Factors: DVT Hx past pregnancy. Limitations: Poor ultrasound/tissue interface. Comparison Study: No significant changes seen since prior exam 03/13/23 Performing Technologist: Shona Simpson  Examination Guidelines: A complete evaluation includes B-mode imaging, spectral Doppler, color Doppler, and power Doppler as needed of all accessible portions of each vessel. Bilateral testing is considered an integral part of a complete examination. Limited examinations for reoccurring indications may be performed as noted. The reflux portion of the exam is performed with the patient in  reverse Trendelenburg.  +---------+---------------+---------+-----------+----------+--------------+ RIGHT    CompressibilityPhasicitySpontaneityPropertiesThrombus Aging +---------+---------------+---------+-----------+----------+--------------+ CFV      Full           Yes      Yes                                 +---------+---------------+---------+-----------+----------+--------------+ SFJ      Full                                                        +---------+---------------+---------+-----------+----------+--------------+

## 2023-05-12 ENCOUNTER — Telehealth: Payer: Self-pay

## 2023-05-12 ENCOUNTER — Telehealth: Payer: Self-pay | Admitting: Nurse Practitioner

## 2023-05-12 DIAGNOSIS — I701 Atherosclerosis of renal artery: Secondary | ICD-10-CM | POA: Diagnosis not present

## 2023-05-12 DIAGNOSIS — I5023 Acute on chronic systolic (congestive) heart failure: Secondary | ICD-10-CM | POA: Diagnosis not present

## 2023-05-12 DIAGNOSIS — I5032 Chronic diastolic (congestive) heart failure: Secondary | ICD-10-CM | POA: Diagnosis not present

## 2023-05-12 DIAGNOSIS — L97821 Non-pressure chronic ulcer of other part of left lower leg limited to breakdown of skin: Secondary | ICD-10-CM | POA: Diagnosis not present

## 2023-05-12 DIAGNOSIS — I872 Venous insufficiency (chronic) (peripheral): Secondary | ICD-10-CM | POA: Diagnosis not present

## 2023-05-12 DIAGNOSIS — I82521 Chronic embolism and thrombosis of right iliac vein: Secondary | ICD-10-CM | POA: Diagnosis not present

## 2023-05-12 DIAGNOSIS — I82411 Acute embolism and thrombosis of right femoral vein: Secondary | ICD-10-CM | POA: Diagnosis not present

## 2023-05-12 DIAGNOSIS — S90822D Blister (nonthermal), left foot, subsequent encounter: Secondary | ICD-10-CM | POA: Diagnosis not present

## 2023-05-12 DIAGNOSIS — I480 Paroxysmal atrial fibrillation: Secondary | ICD-10-CM | POA: Diagnosis not present

## 2023-05-12 DIAGNOSIS — L97811 Non-pressure chronic ulcer of other part of right lower leg limited to breakdown of skin: Secondary | ICD-10-CM | POA: Diagnosis not present

## 2023-05-12 DIAGNOSIS — I82432 Acute embolism and thrombosis of left popliteal vein: Secondary | ICD-10-CM | POA: Diagnosis not present

## 2023-05-12 DIAGNOSIS — I110 Hypertensive heart disease with heart failure: Secondary | ICD-10-CM | POA: Diagnosis not present

## 2023-05-12 DIAGNOSIS — I70223 Atherosclerosis of native arteries of extremities with rest pain, bilateral legs: Secondary | ICD-10-CM | POA: Diagnosis not present

## 2023-05-12 DIAGNOSIS — J9859 Other diseases of mediastinum, not elsewhere classified: Secondary | ICD-10-CM | POA: Diagnosis not present

## 2023-05-12 DIAGNOSIS — K551 Chronic vascular disorders of intestine: Secondary | ICD-10-CM | POA: Diagnosis not present

## 2023-05-12 DIAGNOSIS — E1151 Type 2 diabetes mellitus with diabetic peripheral angiopathy without gangrene: Secondary | ICD-10-CM | POA: Diagnosis not present

## 2023-05-12 NOTE — Telephone Encounter (Signed)
[facility] called Requesting continuation of physical therapy 2 week 4 , 1 week 1

## 2023-05-12 NOTE — Telephone Encounter (Signed)
April wanted a verbal for continuing Home health 1x week for 6 weeks and pt has blister on right are and leg (not advised of which one ) requesting foam wrap and cream. Please advise of approval. Thanks [facility]

## 2023-05-15 DIAGNOSIS — I110 Hypertensive heart disease with heart failure: Secondary | ICD-10-CM | POA: Diagnosis not present

## 2023-05-15 DIAGNOSIS — I82411 Acute embolism and thrombosis of right femoral vein: Secondary | ICD-10-CM | POA: Diagnosis not present

## 2023-05-15 DIAGNOSIS — I701 Atherosclerosis of renal artery: Secondary | ICD-10-CM | POA: Diagnosis not present

## 2023-05-15 DIAGNOSIS — I82432 Acute embolism and thrombosis of left popliteal vein: Secondary | ICD-10-CM | POA: Diagnosis not present

## 2023-05-15 DIAGNOSIS — I70223 Atherosclerosis of native arteries of extremities with rest pain, bilateral legs: Secondary | ICD-10-CM | POA: Diagnosis not present

## 2023-05-15 DIAGNOSIS — L97811 Non-pressure chronic ulcer of other part of right lower leg limited to breakdown of skin: Secondary | ICD-10-CM | POA: Diagnosis not present

## 2023-05-15 DIAGNOSIS — E1151 Type 2 diabetes mellitus with diabetic peripheral angiopathy without gangrene: Secondary | ICD-10-CM | POA: Diagnosis not present

## 2023-05-15 DIAGNOSIS — I82521 Chronic embolism and thrombosis of right iliac vein: Secondary | ICD-10-CM | POA: Diagnosis not present

## 2023-05-15 DIAGNOSIS — L97821 Non-pressure chronic ulcer of other part of left lower leg limited to breakdown of skin: Secondary | ICD-10-CM | POA: Diagnosis not present

## 2023-05-15 DIAGNOSIS — I5032 Chronic diastolic (congestive) heart failure: Secondary | ICD-10-CM | POA: Diagnosis not present

## 2023-05-15 DIAGNOSIS — I872 Venous insufficiency (chronic) (peripheral): Secondary | ICD-10-CM | POA: Diagnosis not present

## 2023-05-15 DIAGNOSIS — I5023 Acute on chronic systolic (congestive) heart failure: Secondary | ICD-10-CM | POA: Diagnosis not present

## 2023-05-15 DIAGNOSIS — K551 Chronic vascular disorders of intestine: Secondary | ICD-10-CM | POA: Diagnosis not present

## 2023-05-15 DIAGNOSIS — I480 Paroxysmal atrial fibrillation: Secondary | ICD-10-CM | POA: Diagnosis not present

## 2023-05-15 DIAGNOSIS — S90822D Blister (nonthermal), left foot, subsequent encounter: Secondary | ICD-10-CM | POA: Diagnosis not present

## 2023-05-15 DIAGNOSIS — J9859 Other diseases of mediastinum, not elsewhere classified: Secondary | ICD-10-CM | POA: Diagnosis not present

## 2023-05-16 DIAGNOSIS — I5032 Chronic diastolic (congestive) heart failure: Secondary | ICD-10-CM | POA: Diagnosis not present

## 2023-05-16 DIAGNOSIS — L97821 Non-pressure chronic ulcer of other part of left lower leg limited to breakdown of skin: Secondary | ICD-10-CM | POA: Diagnosis not present

## 2023-05-16 DIAGNOSIS — K551 Chronic vascular disorders of intestine: Secondary | ICD-10-CM | POA: Diagnosis not present

## 2023-05-16 DIAGNOSIS — S90822D Blister (nonthermal), left foot, subsequent encounter: Secondary | ICD-10-CM | POA: Diagnosis not present

## 2023-05-16 DIAGNOSIS — E1151 Type 2 diabetes mellitus with diabetic peripheral angiopathy without gangrene: Secondary | ICD-10-CM | POA: Diagnosis not present

## 2023-05-16 DIAGNOSIS — I110 Hypertensive heart disease with heart failure: Secondary | ICD-10-CM | POA: Diagnosis not present

## 2023-05-16 DIAGNOSIS — I70223 Atherosclerosis of native arteries of extremities with rest pain, bilateral legs: Secondary | ICD-10-CM | POA: Diagnosis not present

## 2023-05-16 DIAGNOSIS — I82411 Acute embolism and thrombosis of right femoral vein: Secondary | ICD-10-CM | POA: Diagnosis not present

## 2023-05-16 DIAGNOSIS — I5023 Acute on chronic systolic (congestive) heart failure: Secondary | ICD-10-CM | POA: Diagnosis not present

## 2023-05-16 DIAGNOSIS — J9859 Other diseases of mediastinum, not elsewhere classified: Secondary | ICD-10-CM | POA: Diagnosis not present

## 2023-05-16 DIAGNOSIS — I82521 Chronic embolism and thrombosis of right iliac vein: Secondary | ICD-10-CM | POA: Diagnosis not present

## 2023-05-16 DIAGNOSIS — I872 Venous insufficiency (chronic) (peripheral): Secondary | ICD-10-CM | POA: Diagnosis not present

## 2023-05-16 DIAGNOSIS — I82432 Acute embolism and thrombosis of left popliteal vein: Secondary | ICD-10-CM | POA: Diagnosis not present

## 2023-05-16 DIAGNOSIS — I701 Atherosclerosis of renal artery: Secondary | ICD-10-CM | POA: Diagnosis not present

## 2023-05-16 DIAGNOSIS — I480 Paroxysmal atrial fibrillation: Secondary | ICD-10-CM | POA: Diagnosis not present

## 2023-05-16 DIAGNOSIS — L97811 Non-pressure chronic ulcer of other part of right lower leg limited to breakdown of skin: Secondary | ICD-10-CM | POA: Diagnosis not present

## 2023-05-16 NOTE — Telephone Encounter (Signed)
April was advise [facility]

## 2023-05-17 NOTE — Telephone Encounter (Signed)
If this is in regards to home health, what they mean is an order for social worker to see him in the context of the home health services. You just need to put in orders for [facility] to do that. It would be a Psychologist, forensic.

## 2023-05-18 ENCOUNTER — Ambulatory Visit (INDEPENDENT_AMBULATORY_CARE_PROVIDER_SITE_OTHER): Payer: Medicare Other | Admitting: Nurse Practitioner

## 2023-05-18 ENCOUNTER — Encounter: Payer: Self-pay | Admitting: Nurse Practitioner

## 2023-05-18 VITALS — BP 136/78 | HR 76 | Ht 62.0 in | Wt 143.0 lb

## 2023-05-18 DIAGNOSIS — I5020 Unspecified systolic (congestive) heart failure: Secondary | ICD-10-CM | POA: Diagnosis not present

## 2023-05-18 NOTE — Patient Instructions (Addendum)
1. Heart failure with mildly reduced ejection fraction (HFmrEF, 41-49%) (HCC)  - follow with cardiology  -follow with vascular  Follow up:  Follow up in 3 months

## 2023-05-18 NOTE — Telephone Encounter (Signed)
Done KH 

## 2023-05-19 DIAGNOSIS — S51801D Unspecified open wound of right forearm, subsequent encounter: Secondary | ICD-10-CM | POA: Diagnosis not present

## 2023-05-19 DIAGNOSIS — S90822D Blister (nonthermal), left foot, subsequent encounter: Secondary | ICD-10-CM | POA: Diagnosis not present

## 2023-05-19 DIAGNOSIS — K551 Chronic vascular disorders of intestine: Secondary | ICD-10-CM | POA: Diagnosis not present

## 2023-05-19 DIAGNOSIS — I4819 Other persistent atrial fibrillation: Secondary | ICD-10-CM | POA: Diagnosis not present

## 2023-05-19 DIAGNOSIS — I70223 Atherosclerosis of native arteries of extremities with rest pain, bilateral legs: Secondary | ICD-10-CM | POA: Diagnosis not present

## 2023-05-19 DIAGNOSIS — I110 Hypertensive heart disease with heart failure: Secondary | ICD-10-CM | POA: Diagnosis not present

## 2023-05-19 DIAGNOSIS — I82411 Acute embolism and thrombosis of right femoral vein: Secondary | ICD-10-CM | POA: Diagnosis not present

## 2023-05-19 DIAGNOSIS — I82432 Acute embolism and thrombosis of left popliteal vein: Secondary | ICD-10-CM | POA: Diagnosis not present

## 2023-05-19 DIAGNOSIS — L97811 Non-pressure chronic ulcer of other part of right lower leg limited to breakdown of skin: Secondary | ICD-10-CM | POA: Diagnosis not present

## 2023-05-19 DIAGNOSIS — J9601 Acute respiratory failure with hypoxia: Secondary | ICD-10-CM | POA: Diagnosis not present

## 2023-05-19 DIAGNOSIS — J9859 Other diseases of mediastinum, not elsewhere classified: Secondary | ICD-10-CM | POA: Diagnosis not present

## 2023-05-19 DIAGNOSIS — I5043 Acute on chronic combined systolic (congestive) and diastolic (congestive) heart failure: Secondary | ICD-10-CM | POA: Diagnosis not present

## 2023-05-19 DIAGNOSIS — I82521 Chronic embolism and thrombosis of right iliac vein: Secondary | ICD-10-CM | POA: Diagnosis not present

## 2023-05-19 DIAGNOSIS — I701 Atherosclerosis of renal artery: Secondary | ICD-10-CM | POA: Diagnosis not present

## 2023-05-19 DIAGNOSIS — I872 Venous insufficiency (chronic) (peripheral): Secondary | ICD-10-CM | POA: Diagnosis not present

## 2023-05-19 DIAGNOSIS — E1151 Type 2 diabetes mellitus with diabetic peripheral angiopathy without gangrene: Secondary | ICD-10-CM | POA: Diagnosis not present

## 2023-05-21 NOTE — Progress Notes (Unsigned)
Cardiology Office Note    Date:  05/23/2023  ID:  Vergene, Marland 07/07/1935, MRN [id number] PCP:  Ivonne Andrew, NP  Cardiologist:  Dietrich Pates, MD  Electrophysiologist:  None   Chief Complaint: Hospital follow up for new afib   History of Present Illness: Marland Kitchen    Ginelle Schrade is a 88 y.o. female with visit-pertinent history of persistent AF, atrial flutter, acute DVT in 2020, hypertension, CHF and PAD.   In 02/2019 Ms. Scholler suffered a mechanical fall resulting in a distal femur fracture.  She underwent ORIF on 03/08/2019 and discharged to SNF.  An echocardiogram on 03/09/2019 showed LVEF 60 to 65% and mild LVH, noted to be in A-fib, it does not appear she was seen by cardiology.  Xarelto was prescribed at discharge but due to an error treatment was not provided to her and Ortho note in 04/2019 she was restarted on 20 mg Xarelto.  She had developed acute DVT in the right peroneal vein in 03/29/2019 and placed on treatment dose Xarelto.  Does not appear that this was continued long-term.  On 03/13/23 she presented to [facility] with right leg pain. [place] was negative for DVT but did show incidental finding of occlusion of the proximal to mid SFA and dampened flow in the distal SFA, popliteal artery and PTA.  She was also noted to be in A-fib RVR in the 140s.  She was initially started on IV amiodarone, however this was stopped due to concerning for possible embolic etiology behind RLE arterial occlusion.  She was started on IV heparin and IV diltiazem.  Echo during admission showed LVEF of 40 to 45%.  On 03/16/2023 she underwent thrombolysis of right lower extremity and SFA stenting, following she had worsening encephalopathy and possible aspiration event leading to worsening hypoxia requiring intubation, she was extubated on 8/30.  On 03/24/2023 she had coffee-ground emesis, underwent EGD on 9/8 found to have large hiatal hernia, severe circumferential esophagitis with ulceration without bleeding, suspected long  segment of Barrett's esophagus, gastritis, nonbleeding duodenal ulcers.  She was discharged on 03/30/2023 to SNF.  On 05/04/2023 she presented to the emergency room elevation of increasing leg swelling and 3 pound weight gain since the day prior.  There was question if there was a component of acute hypoxic respiratory failure with chronic component patient's oxygen saturation dropped to 60% 6% on room air during ED physicians evaluation and she was persistently hypoxic despite aggressive diuresis and being euvolemic.  CT chest showed possible component of basilar effusion as well as right sided infiltrate, it was felt that she was likely suffering from hypoxia from the effects of a large hiatal hernia.  Repeat echocardiogram on 05/05/2023 indicated LVEF of 50 to 55%, LV with low normal function, global hypokinesis, diastolic parameters were indeterminate, moderately elevated pulmonary artery systolic pressure.   Today she presents for follow up. She reports that she is doing very well.  Following her last hospital discharge she has been maintained on 2 L of oxygen via nasal cannula.  She denies chest pain, shortness of breath, palpitations, orthopnea or PND.  She does report intermittent lower extremity edema that was much improved with Lasix on Monday, Wednesday, Friday.  She has been working with PT at home and feels that she is doing very well.  She does note that she had a fall Halloween night at 3 AM.  She reports that she was in her kitchen and her head reached into her fridge, something started to  fall and she tried to reach out and grab it which caused her to lose her balance, she hit her right arm, denies hitting her head or losing consciousness.  She reports that this was her first fall.  She uses a walker at home.  Labwork independently reviewed: 05/08/2023: Sodium 132, creatinine 0.92, magnesium 2.1, potassium 3.5, hemoglobin 10.9, hematocrit 32.7 ROS: .   Today she denies chest pain, shortness  of breath, lower extremity edema, fatigue, palpitations, melena, hematuria, hemoptysis, diaphoresis, weakness, presyncope, syncope, orthopnea, and PND.  All other systems are reviewed and otherwise negative. Studies Reviewed: Marland Kitchen    EKG:  EKG is ordered today, personally reviewed, demonstrating  EKG Interpretation Date/Time:  Tuesday May 23 2023 15:11:39 EST Ventricular Rate:  70 PR Interval:    QRS Duration:  82 QT Interval:  378 QTC Calculation: 408 R Axis:   -25  Text Interpretation: Atrial flutter with variable A-V block Confirmed by Reather Littler [phone]) on 05/23/2023 4:56:20 PM   CV Studies: Cardiac Studies & Procedures       ECHOCARDIOGRAM  ECHOCARDIOGRAM COMPLETE 05/05/2023  Narrative ECHOCARDIOGRAM REPORT    Patient Name:   HERMIE REAGOR Date of Exam: 05/05/2023 Medical Rec #:  [id number]  Height:       62.0 in Accession #:    [id number] Weight:       153.7 lb Date of Birth:  04-04-1935  BSA:          1.709 m Patient Age:    88 years   BP:           137/76 mmHg Patient Gender: F          HR:           85 bpm. Exam Location:  Inpatient  Procedure: 2D Echo, 3D Echo, Cardiac Doppler and Color Doppler  Indications:    I50.40* Unspecified combined systolic (congestive) and diastolic (congestive) heart failure  History:        Patient has prior history of Echocardiogram examinations, most recent 03/16/2023. CHF, Abnormal ECG, Arrythmias:Atrial Fibrillation, Signs/Symptoms:Shortness of Breath and Dyspnea; Risk Factors:Dyslipidemia. Pericardial effusion.  Sonographer:    Sheralyn Boatman RDCS Referring Phys: [id number] [facility] GOEL  IMPRESSIONS   1. Left ventricular ejection fraction, by estimation, is 50 to 55%. The left ventricle has low normal function. The left ventricle demonstrates global hypokinesis. Left ventricular diastolic parameters are indeterminate. 2. Right ventricular systolic function is mildly reduced. The right ventricular size is normal. There is moderately  elevated pulmonary artery systolic pressure. The estimated right ventricular systolic pressure is 53.9 mmHg. 3. Left atrial size was moderately dilated. 4. Right atrial size was mildly dilated. 5. A small pericardial effusion is present. 6. The mitral valve is degenerative. Trivial mitral valve regurgitation. No evidence of mitral stenosis. 7. Tricuspid valve regurgitation is mild to moderate. 8. The aortic valve is tricuspid. Aortic valve regurgitation is not visualized. Aortic valve sclerosis/calcification is present, without any evidence of aortic stenosis. 9. The inferior vena cava is dilated in size with <50% respiratory variability, suggesting right atrial pressure of 15 mmHg.  FINDINGS Left Ventricle: Left ventricular ejection fraction, by estimation, is 50 to 55%. The left ventricle has low normal function. The left ventricle demonstrates global hypokinesis. The left ventricular internal cavity size was normal in size. There is no left ventricular hypertrophy. Left ventricular diastolic parameters are indeterminate.  Right Ventricle: The right ventricular size is normal. No increase in right ventricular wall thickness. Right ventricular systolic function is mildly reduced.  There is moderately elevated pulmonary artery systolic pressure. The tricuspid regurgitant velocity is 3.12 m/s, and with an assumed right atrial pressure of 15 mmHg, the estimated right ventricular systolic pressure is 53.9 mmHg.  Left Atrium: Left atrial size was moderately dilated.  Right Atrium: Right atrial size was mildly dilated.  Pericardium: A small pericardial effusion is present.  Mitral Valve: The mitral valve is degenerative in appearance. Trivial mitral valve regurgitation. No evidence of mitral valve stenosis. MV peak gradient, 6.7 mmHg. The mean mitral valve gradient is 2.0 mmHg.  Tricuspid Valve: The tricuspid valve is normal in structure. Tricuspid valve regurgitation is mild to moderate. No  evidence of tricuspid stenosis.  Aortic Valve: The aortic valve is tricuspid. Aortic valve regurgitation is not visualized. Aortic valve sclerosis/calcification is present, without any evidence of aortic stenosis.  Pulmonic Valve: The pulmonic valve was normal in structure. Pulmonic valve regurgitation is not visualized. No evidence of pulmonic stenosis.  Aorta: The aortic root and ascending aorta are structurally normal, with no evidence of dilitation.  Venous: The inferior vena cava is dilated in size with less than 50% respiratory variability, suggesting right atrial pressure of 15 mmHg.  IAS/Shunts: No atrial level shunt detected by color flow Doppler.   LEFT VENTRICLE PLAX 2D LVIDd:         3.70 cm LVIDs:         2.90 cm LV PW:         0.90 cm LV IVS:        0.80 cm LVOT diam:     2.10 cm     3D Volume EF: LV SV:         61          3D EF:        54 % LV SV Index:   36          LV EDV:       86 ml LVOT Area:     3.46 cm    LV ESV:       40 ml LV SV:        46 ml  LV Volumes (MOD) LV vol d, MOD A2C: 52.6 ml LV vol d, MOD A4C: 32.8 ml LV vol s, MOD A2C: 24.3 ml LV vol s, MOD A4C: 16.7 ml LV SV MOD A2C:     28.3 ml LV SV MOD A4C:     32.8 ml LV SV MOD BP:      21.2 ml  RIGHT VENTRICLE         IVC TAPSE (M-mode): 1.6 cm  IVC diam: 2.20 cm  LEFT ATRIUM              Index        RIGHT ATRIUM           Index LA diam:        3.70 cm  2.16 cm/m   RA Area:     18.70 cm LA Vol (A2C):   109.0 ml 63.78 ml/m  RA Volume:   49.70 ml  29.08 ml/m LA Vol (A4C):   59.7 ml  34.93 ml/m LA Biplane Vol: 80.9 ml  47.33 ml/m AORTIC VALVE LVOT Vmax:   103.00 cm/s LVOT Vmean:  65.600 cm/s LVOT VTI:    0.176 m  AORTA Ao Root diam: 2.60 cm Ao Asc diam:  3.60 cm  MITRAL VALVE                TRICUSPID  VALVE MV Area (PHT): 4.68 cm     TR Peak grad:   38.9 mmHg MV Area VTI:   2.36 cm     TR Vmax:        312.00 cm/s MV Peak grad:  6.7 mmHg MV Mean grad:  2.0 mmHg     SHUNTS MV  Vmax:       1.29 m/s     Systemic VTI:  0.18 m MV Vmean:      67.5 cm/s    Systemic Diam: 2.10 cm MV Decel Time: 162 msec MV E velocity: 138.00 cm/s  Epifanio Lesches MD Electronically signed by Epifanio Lesches MD Signature Date/Time: 05/05/2023/6:29:16 PM    Final              Current Reported Medications:.    Current Meds  Medication Sig   apixaban (ELIQUIS) 5 MG TABS tablet Take 1 tablet (5 mg total) by mouth 2 (two) times daily.   atorvastatin (LIPITOR) 40 MG tablet Take 1 tablet (40 mg total) by mouth daily.   [START ON 05/24/2023] furosemide (LASIX) 20 MG tablet Take 1 tablet (20 mg total) by mouth every Monday, Wednesday, and Friday.   metoprolol tartrate (LOPRESSOR) 100 MG tablet Take 1 tablet (100 mg total) by mouth 2 (two) times daily.   pantoprazole (PROTONIX) 40 MG tablet Take 1 tablet (40 mg total) by mouth 2 (two) times daily.   polyethylene glycol (MIRALAX / GLYCOLAX) 17 g packet Take 17 g by mouth daily as needed for mild constipation.   sucralfate (CARAFATE) 1 GM/10ML suspension Take 10 mLs (1 g total) by mouth every 6 (six) hours.   [DISCONTINUED] amiodarone (PACERONE) 200 MG tablet Take 1 tablet (200 mg total) by mouth 2 (two) times daily.   Physical Exam:    VS:  BP 136/76   Pulse 70   Ht 5\' 4"  (1.626 m)   Wt 144 lb (65.3 kg)   SpO2 100%   BMI 24.72 kg/m    Wt Readings from Last 3 Encounters:  05/23/23 144 lb (65.3 kg)  05/18/23 143 lb (64.9 kg)  05/08/23 149 lb 14.6 oz (68 kg)    GEN: Well nourished, well developed in no acute distress NECK: No JVD; No carotid bruits CARDIAC: Irregular RR, no murmurs, rubs, gallops RESPIRATORY:  Clear to auscultation without rales, wheezing or rhonchi  ABDOMEN: Soft, non-tender, non-distended EXTREMITIES:  Mild bilateral lower extremity edema; No acute deformity   Asessement and Plan:Marland Kitchen    Afib with RVR/atrial flutter: Noted to have paroxysmal atrial fibrillation on echo in 2020 but she was never seen by  cardiology.  She presented in 02/2023 with critical limb ischemia, RLE arterial occlusion from possible embolic etiology but was also noted to be in atrial fibrillation with RVR.  Echo showed LVEF of 40 to 45%.  With improved rate control her LVEF has improved to 50 to 55%.  During hospitalization she was started on oral amiodarone 200 mg twice daily.  Her EKG today shows atrial flutter at 70 bpm.  She denies palpitations or feeling of increased heart rate.  She continues on Eliquis 5 mg twice daily, denies bleeding problems, of note she does have a right forearm hematoma that she reports is improving, see below.  Today we discussed cardioversion, patient deferred, she reports she is feeling well and does not want to undergo further procedures at this time. Encouraged to monitor her heart rate at home.  Reviewed ED precautions. Will reduce amiodarone to 200 mg  daily, continue Eliquis 5 mg twice daily and metoprolol tartrate 100 mg twice daily. Check BMET and TSH.   HF with improved EF: Echo during admission showed LVEF of 40 to 45%. With improved rate control her LVEF has improved to 50 to 55%.  She has some mild bilateral lower extremity edema, she reports that she has not taken her Lasix last few days as she has run out.  Discussed working to increase GDMT, patient deferred as she reports that she is feeling well and would rather not try any new medications at this time. She has been trying to monitor her weights at home, she reports that they have been overall stable.  Encouraged to continue monitoring and notify the office if weight gain of 2 to 3 pounds overnight or 5 pounds in a week. She is currently on 2L of oxygen via nasal cannula, started during recent hospitalization. Will send in refill of her Lasix 20 mg by mouth every Monday, Wednesday and Friday. Check BMET.    Right arm swelling/hematoma/bruising: Patient reports that she fell on Halloween on her right arm, she was trying to catch something that  fell out of her fridge.  She denies numbness, tingling or decreased motion.  Notes some mild soreness. Her right forearm has bruising and swelling, she has a hematoma on the medial aspect of the forearm, it is nonpulsatile. She has 1+ bilateral radial pulses, hands equally warm. She notes improvement in swelling and size of hematoma in the recent days.  Will check x-ray for fracture and right upper extremity duplex.  She is currently on Eliquis 5 mg twice daily.  Reviewed ED precautions such as if she starts to lose sensation in the arm, have significant swelling with increased pain or new difficulty breathing that she should report to the emergency room.   Pericardial effusion: Noted on echo on 03/14/23 and repeat echo on 05/05/23, stable. She denies chest pain or increased shortness of breath.   Hypertension: Blood pressure today 142/78, on recheck 136/76.  Encouraged to monitor her blood pressures at home, given advanced age, recent fall and need for diuresis will defer adding antihypertensive.  Will restart her Lasix 20 mg Monday, Wednesday and Friday.  Continue Lopressor 100 mg twice daily.  Occlusion of bilateral lower extremity arteries: CTA on admission showed complete occlusion of left popliteal artery and right SFA as well as CTO of right internal iliac artery.  Lower extremity arterial ultrasound showed total occlusion of left popliteal artery and right SFA.  ABIs indicative of critical limb ischemia on the right and moderate disease on the left.  On 03/16/2023 she underwent thrombolysis with VVS.  She was then seen in follow-up by Dr. Randie Heinz on 05/03/2023, he noted she may require aortogram from the right common femoral approach to revascularize the left popliteal, however given advanced age and medical comorbidities would be a high risk procedure.  She will have close follow-up with him.  Lesion of mediastinum: Noted incidental finding on CT chest of nodule anterior mediastinum.  She has been  referred to CT surgery for follow-up.  Disposition: F/u with APP at [facility] office in one month and Dr. Tenny Craw in four months.   Signed, [name], NP

## 2023-05-22 DIAGNOSIS — I701 Atherosclerosis of renal artery: Secondary | ICD-10-CM | POA: Diagnosis not present

## 2023-05-22 DIAGNOSIS — S90822D Blister (nonthermal), left foot, subsequent encounter: Secondary | ICD-10-CM | POA: Diagnosis not present

## 2023-05-22 DIAGNOSIS — I872 Venous insufficiency (chronic) (peripheral): Secondary | ICD-10-CM | POA: Diagnosis not present

## 2023-05-22 DIAGNOSIS — I70223 Atherosclerosis of native arteries of extremities with rest pain, bilateral legs: Secondary | ICD-10-CM | POA: Diagnosis not present

## 2023-05-22 DIAGNOSIS — I5043 Acute on chronic combined systolic (congestive) and diastolic (congestive) heart failure: Secondary | ICD-10-CM | POA: Diagnosis not present

## 2023-05-22 DIAGNOSIS — I82411 Acute embolism and thrombosis of right femoral vein: Secondary | ICD-10-CM | POA: Diagnosis not present

## 2023-05-22 DIAGNOSIS — S51801D Unspecified open wound of right forearm, subsequent encounter: Secondary | ICD-10-CM | POA: Diagnosis not present

## 2023-05-22 DIAGNOSIS — K551 Chronic vascular disorders of intestine: Secondary | ICD-10-CM | POA: Diagnosis not present

## 2023-05-22 DIAGNOSIS — J9859 Other diseases of mediastinum, not elsewhere classified: Secondary | ICD-10-CM | POA: Diagnosis not present

## 2023-05-22 DIAGNOSIS — J9601 Acute respiratory failure with hypoxia: Secondary | ICD-10-CM | POA: Diagnosis not present

## 2023-05-22 DIAGNOSIS — I82432 Acute embolism and thrombosis of left popliteal vein: Secondary | ICD-10-CM | POA: Diagnosis not present

## 2023-05-22 DIAGNOSIS — L97811 Non-pressure chronic ulcer of other part of right lower leg limited to breakdown of skin: Secondary | ICD-10-CM | POA: Diagnosis not present

## 2023-05-22 DIAGNOSIS — I110 Hypertensive heart disease with heart failure: Secondary | ICD-10-CM | POA: Diagnosis not present

## 2023-05-22 DIAGNOSIS — I82521 Chronic embolism and thrombosis of right iliac vein: Secondary | ICD-10-CM | POA: Diagnosis not present

## 2023-05-22 DIAGNOSIS — E1151 Type 2 diabetes mellitus with diabetic peripheral angiopathy without gangrene: Secondary | ICD-10-CM | POA: Diagnosis not present

## 2023-05-22 DIAGNOSIS — I4819 Other persistent atrial fibrillation: Secondary | ICD-10-CM | POA: Diagnosis not present

## 2023-05-23 ENCOUNTER — Ambulatory Visit: Payer: Medicare Other | Attending: Physician Assistant | Admitting: Cardiology

## 2023-05-23 ENCOUNTER — Other Ambulatory Visit: Payer: Self-pay

## 2023-05-23 ENCOUNTER — Telehealth: Payer: Self-pay

## 2023-05-23 ENCOUNTER — Encounter: Payer: Self-pay | Admitting: Cardiology

## 2023-05-23 VITALS — BP 136/76 | HR 70 | Ht 64.0 in | Wt 144.0 lb

## 2023-05-23 DIAGNOSIS — I4892 Unspecified atrial flutter: Secondary | ICD-10-CM | POA: Diagnosis not present

## 2023-05-23 DIAGNOSIS — I4819 Other persistent atrial fibrillation: Secondary | ICD-10-CM | POA: Diagnosis not present

## 2023-05-23 DIAGNOSIS — R2231 Localized swelling, mass and lump, right upper limb: Secondary | ICD-10-CM

## 2023-05-23 DIAGNOSIS — I3139 Other pericardial effusion (noninflammatory): Secondary | ICD-10-CM

## 2023-05-23 DIAGNOSIS — M79601 Pain in right arm: Secondary | ICD-10-CM

## 2023-05-23 DIAGNOSIS — J9859 Other diseases of mediastinum, not elsewhere classified: Secondary | ICD-10-CM

## 2023-05-23 DIAGNOSIS — I5020 Unspecified systolic (congestive) heart failure: Secondary | ICD-10-CM

## 2023-05-23 DIAGNOSIS — I739 Peripheral vascular disease, unspecified: Secondary | ICD-10-CM

## 2023-05-23 DIAGNOSIS — I10 Essential (primary) hypertension: Secondary | ICD-10-CM

## 2023-05-23 MED ORDER — AMIODARONE HCL 200 MG PO TABS: 200.0000 mg | ORAL_TABLET | Freq: Every day | ORAL | 3 refills | Status: DC

## 2023-05-23 MED ORDER — FUROSEMIDE 20 MG PO TABS
20.0000 mg | ORAL_TABLET | ORAL | 3 refills | Status: DC
Start: 2023-05-24 — End: 2023-05-25

## 2023-05-23 NOTE — Patient Instructions (Addendum)
Medication Instructions:  Decrease: Amiodarone (Pacerone) to 200 mg Once Daily Re start:  Furosemide (Lasix) 20 mg Every Monday, Wednesday, and Friday  *If you need a refill on your cardiac medications before your next appointment, please call your pharmacy*   Lab Work: BMET and TSH today If you have labs (blood work) drawn today and your tests are completely normal, you will receive your results only by: MyChart Message (if you have MyChart) OR A paper copy in the mail If you have any lab test that is abnormal or we need to change your treatment, we will call you to review the results.   Testing/Procedures: Your physician has requested that you have an upper extremity venous duplex (right arm) on 05/24/2023 (tomorrow) at 9:15 am at [facility] [address] [place], [place], [place] [address].  This test is an ultrasound of the veins in the arm. It looks at venous blood flow that carries blood from the heart to the arm. Allow thirty minutes for an Upper Venous exam. There are no restrictions or special instructions.  Please note: We ask at that you not bring children with you during ultrasound (echo/ vascular) testing. Due to room size and safety concerns, children are not allowed in the ultrasound rooms during exams. Our front office staff cannot provide observation of children in our lobby area while testing is being conducted. An adult accompanying a patient to their appointment will only be allowed in the ultrasound room at the discretion of the ultrasound technician under special circumstances. We apologize for any inconvenience.   Also: X Ray of Right Forearm was ordered.  Please go to: [facility] [address] [place], [place].   Follow-Up: At [facility], you and your health needs are our priority.  As part of our continuing mission to provide you with exceptional heart care, we have created designated Provider Care Teams.  These Care Teams include your  primary Cardiologist (physician) and Advanced Practice Providers (APPs -  Physician Assistants and Nurse Practitioners) who all work together to provide you with the care you need, when you need it.  We recommend signing up for the patient portal called "MyChart".  Sign up information is provided on this After Visit Summary.  MyChart is used to connect with patients for Virtual Visits (Telemedicine).  Patients are able to view lab/test results, encounter notes, upcoming appointments, etc.  Non-urgent messages can be sent to your provider as well.   To learn more about what you can do with MyChart, go to [email or web address].    Your next appointment:   1 month(s)  Provider:   Ronie Spies, PA-C or Tereso Newcomer, PA-C       Then, Dietrich Pates, MD will plan to see you again in 4 month(s).

## 2023-05-23 NOTE — Telephone Encounter (Signed)
April form [facility] called to advise pt has fallen twice. Right arm pain, may need x-ray or appt.  April can be reached back at [phone]. Please advise. KH

## 2023-05-24 ENCOUNTER — Ambulatory Visit
Admission: RE | Admit: 2023-05-24 | Discharge: 2023-05-24 | Disposition: A | Discharge status: Home / Self Care | Payer: Medicare Other | Source: Ambulatory Visit | Attending: Cardiology | Admitting: Cardiology

## 2023-05-24 ENCOUNTER — Ambulatory Visit (HOSPITAL_COMMUNITY)
Admission: RE | Admit: 2023-05-24 | Discharge: 2023-05-24 | Disposition: A | Discharge status: Home / Self Care | Payer: Medicare Other | Source: Ambulatory Visit | Attending: Cardiovascular Disease | Admitting: Cardiovascular Disease

## 2023-05-24 DIAGNOSIS — M79601 Pain in right arm: Secondary | ICD-10-CM | POA: Diagnosis not present

## 2023-05-24 DIAGNOSIS — M7989 Other specified soft tissue disorders: Secondary | ICD-10-CM | POA: Diagnosis not present

## 2023-05-24 DIAGNOSIS — I4819 Other persistent atrial fibrillation: Secondary | ICD-10-CM | POA: Diagnosis not present

## 2023-05-24 DIAGNOSIS — I739 Peripheral vascular disease, unspecified: Secondary | ICD-10-CM | POA: Diagnosis not present

## 2023-05-24 DIAGNOSIS — M79631 Pain in right forearm: Secondary | ICD-10-CM | POA: Diagnosis not present

## 2023-05-24 DIAGNOSIS — I5020 Unspecified systolic (congestive) heart failure: Secondary | ICD-10-CM | POA: Diagnosis not present

## 2023-05-25 ENCOUNTER — Ambulatory Visit: Payer: Medicare Other | Admitting: Gastroenterology

## 2023-05-25 ENCOUNTER — Telehealth: Payer: Self-pay | Admitting: Internal Medicine

## 2023-05-25 ENCOUNTER — Other Ambulatory Visit: Payer: Self-pay

## 2023-05-25 VITALS — BP 118/62 | HR 68 | Ht 65.0 in | Wt 142.0 lb

## 2023-05-25 DIAGNOSIS — Z9981 Dependence on supplemental oxygen: Secondary | ICD-10-CM

## 2023-05-25 DIAGNOSIS — Z7901 Long term (current) use of anticoagulants: Secondary | ICD-10-CM

## 2023-05-25 DIAGNOSIS — K449 Diaphragmatic hernia without obstruction or gangrene: Secondary | ICD-10-CM | POA: Diagnosis not present

## 2023-05-25 DIAGNOSIS — K2101 Gastro-esophageal reflux disease with esophagitis, with bleeding: Secondary | ICD-10-CM

## 2023-05-25 DIAGNOSIS — I4892 Unspecified atrial flutter: Secondary | ICD-10-CM

## 2023-05-25 DIAGNOSIS — I4819 Other persistent atrial fibrillation: Secondary | ICD-10-CM

## 2023-05-25 DIAGNOSIS — I5020 Unspecified systolic (congestive) heart failure: Secondary | ICD-10-CM

## 2023-05-25 DIAGNOSIS — K2270 Barrett's esophagus without dysplasia: Secondary | ICD-10-CM

## 2023-05-25 LAB — BASIC METABOLIC PANEL
BUN/Creatinine Ratio: 18 (ref 12–28)
BUN: 19 mg/dL (ref 8–27)
CO2: 32 mmol/L — ABNORMAL HIGH (ref 20–29)
Calcium: 9.5 mg/dL (ref 8.7–10.3)
Chloride: 93 mmol/L — ABNORMAL LOW (ref 96–106)
Creatinine, Ser: 1.04 mg/dL — ABNORMAL HIGH (ref 0.57–1.00)
Glucose: 104 mg/dL — ABNORMAL HIGH (ref 70–99)
Potassium: 4.3 mmol/L (ref 3.5–5.2)
Sodium: 134 mmol/L (ref 134–144)
eGFR: 52 mL/min/{1.73_m2} — ABNORMAL LOW (ref >59)

## 2023-05-25 LAB — TSH: TSH: 68.2 u[IU]/mL — ABNORMAL HIGH (ref 0.450–4.500)

## 2023-05-25 MED ORDER — FUROSEMIDE 20 MG PO TABS: 20.0000 mg | ORAL_TABLET | ORAL | 3 refills | Status: DC

## 2023-05-25 MED ORDER — POTASSIUM CHLORIDE CRYS ER 20 MEQ PO TBCR: 20.0000 meq | EXTENDED_RELEASE_TABLET | ORAL | 0 refills | Status: DC

## 2023-05-25 MED ORDER — PANTOPRAZOLE SODIUM 40 MG PO TBEC: 40.0000 mg | DELAYED_RELEASE_TABLET | Freq: Two times a day (BID) | ORAL | 11 refills | Status: DC

## 2023-05-25 NOTE — Telephone Encounter (Signed)
Called Bud (pts nephew/dpr) back and endorsed to him the rationale as to why the EP Referral was placed and needed for the pt.   Bud is aware the EP appt is needed to discuss better rate control options for for her afib.   Bud said he was able to get the appt made for 11/12 with Dr. Nelly Laurence for this, and will keep this appt as scheduled, now that this is better explained.   Bud verbalized understanding and agrees with this plan.

## 2023-05-25 NOTE — Patient Instructions (Signed)
We have sent the following medications to your pharmacy for you to pick up at your convenience: Protonix 40 mg : Take twice daily  Thank you for entrusting me with your care and for choosing [facility], Dr. Ileene Patrick   If your blood pressure at your visit was 140/90 or greater, please contact your primary care physician to follow up on this. ______________________________________________________  If you are age 86 or older, your body mass index should be between 23-30. Your Body mass index is 23.63 kg/m. If this is out of the aforementioned range listed, please consider follow up with your Primary Care Provider.  If you are age 42 or younger, your body mass index should be between 19-25. Your Body mass index is 23.63 kg/m. If this is out of the aformentioned range listed, please consider follow up with your Primary Care Provider.  ________________________________________________________  The  GI providers would like to encourage you to use [facility] to communicate with providers for non-urgent requests or questions.  Due to long hold times on the telephone, sending your provider a message by [facility] may be a faster and more efficient way to get a response.  Please allow 48 business hours for a response.  Please remember that this is for non-urgent requests.  _______________________________________________________  Due to recent changes in healthcare laws, you may see the results of your imaging and laboratory studies on MyChart before your provider has had a chance to review them.  We understand that in some cases there may be results that are confusing or concerning to you. Not all laboratory results come back in the same time frame and the provider may be waiting for multiple results in order to interpret others.  Please give [place] 48 hours in order for your provider to thoroughly review all the results before contacting the office for clarification of your results.

## 2023-05-25 NOTE — Telephone Encounter (Signed)
Lvm for nephew to call for an appointment or take pt to er or UC

## 2023-05-25 NOTE — Telephone Encounter (Signed)
S/w pts nephew/POA Sarah Wolf to schedule patients urgent referral for EP. He states that with both physicians at the [facility] on 05/23/23 and Dr. Adela Lank today 05/25/23 - they have stated patient is fine, and now patient has this urgent referral. He is confused to what is going on and would like a call back to discuss.

## 2023-05-25 NOTE — Progress Notes (Signed)
HPI :  88 year old female here for a follow-up visit.  I know her from prior hospitalization in September.  She has numerous comorbidities, CHF, A-fib, DVT.  She was admitted in August with right leg pain.  She did not have an obvious DVT but had an occluded mid to proximal SFA. 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 She was in A-fib with RVR and she was started on Heparin IV.  Multilevel occlusive disease thought to be from cardioembolic event with poor outflow in the foot. S/P aortogram with initiation of thrombolysis therapy via catheter from the common femoral artery through the popliteal artery by vascular surgery 8/28.  She was eventually transitioned to Eliquis and aspirin.  She developed coffee-ground emesis and dark stools.  During that time she was having CHF exacerbation, received Lasix, placed on oxygen.  She had a large hiatal hernia on chest x-ray and was started on antibiotics for pneumonia.  When she was stabilized from a cardiopulmonary standpoint she underwent an EGD with me on September 8.  She had a very large hiatal hernia in conjunction with severe circumferential esophagitis, could not clearly see GEJ landmarks.  She had suspected long segment Barrett's without nodularity.  Small biopsies taken showed some Barrett's without dysplasia.  She also had some duodenal ulcers, largest 1 cm in size.  Biopsies negative for H. pylori.  She resolved her bleeding with PPI alone, was able to resume anticoagulation.  She was discharged from that hospitalization.  She unfortunately was readmitted to the hospital from October 17 to 21, found to have CHF exacerbation, needed aggressive diuresis, had hypoxia.  She is now  on home oxygen.  She was seen by cardiology just a few days ago.  On amiodarone for history of flutter/A-fib with RVR.  She remains on Eliquis.  They have been tweaking her Lasix dosing for further diuresis.  They are a bit confused about her dosing of Lasix currently.        EGD 03/26/23: Findings: A large hiatal hernia was present. Severe circumferential esophagitis / ulceration with no active bleeding or stigmata was found thoughout most of the entire esophagus. Started at 17cm from the incisors and distally a few cms above the GEJ. One small biopsy was taken near the periphery of the ulcerated area to rule out viral etiology. Suspected long segment Barrett's esophagus without nodularity was present in the distal esophagus up until the diffuse ulceration - Z line not able to be appreciated. Biopsies not taken given recent bleeding. The exam of the esophagus was otherwise normal. Diffuse moderate inflammation characterized by erythema, friability and granularity was found in the entire examined stomach. Biopsies were taken with a cold forceps for Helicobacter pylori testing. The exam of the stomach was otherwise normal. Five non-bleeding superficial duodenal ulcers with a clean ulcer base (Forrest Class III) were found in the duodenal bulb. Most small 2-40mm in size, the largest lesion was 10 mm in largest dimension. The exam of the duodenum was otherwise normal.  FINAL MICROSCOPIC DIAGNOSIS:   A. STOMACH, BIOPSY:  - Gastric oxyntic mucosa with no specific histopathologic changes  - Helicobacter pylori-like organisms are not identified on routine HE  stain   B. ESOPHAGUS, BIOPSY:  - Barrett's esophagus, negative for dysplasia  -  Negative for viral cytopathic changes     Past Medical History:  Diagnosis Date   Atherosclerosis of native arteries of extremities with rest pain, right leg (HCC)    CHF (congestive heart failure) (HCC)    Diaphragmatic hernia without obstruction or gangrene    GERD  (gastroesophageal reflux disease)    History of DVT (deep vein thrombosis)    Hypertension    Peripheral arterial disease (HCC)    Persistent atrial fibrillation (HCC)    Personal history of peptic ulcer disease    Type 2 diabetes mellitus with diabetic peripheral angiopathy without gangrene (HCC)    Unspecified hearing loss, bilateral      Past Surgical History:  Procedure Laterality Date   ABDOMINAL AORTOGRAM W/LOWER EXTREMITY N/A 03/15/2023   Procedure: ABDOMINAL AORTOGRAM W/LOWER EXTREMITY;  Surgeon: Victorino Sparrow, MD;  Location[facility] INVASIVE CV LAB;  Service: Cardiovascular;  Laterality: N/A;   BIOPSY  03/26/2023   Procedure: BIOPSY;  Surgeon: Benancio Deeds, MD;  Location: [facility] ENDOSCOPY;  Service: Gastroenterology;;   ESOPHAGOGASTRODUODENOSCOPY (EGD) WITH PROPOFOL N/A 03/26/2023   Procedure: ESOPHAGOGASTRODUODENOSCOPY (EGD) WITH PROPOFOL;  Surgeon: Benancio Deeds, MD;  Location: [facility] ENDOSCOPY;  Service: Gastroenterology;  Laterality: N/A;   ORIF FEMUR FRACTURE Right 03/08/2019   Procedure: OPEN REDUCTION INTERNAL FIXATION (ORIF) DISTAL FEMUR FRACTURE;  Surgeon: Kathryne Hitch, MD;  Location: [facility] ORS;  Service: Orthopedics;  Laterality: Right;   PERIPHERAL VASCULAR INTERVENTION Right 03/16/2023   Procedure: PERIPHERAL VASCULAR INTERVENTION;  Surgeon: Cephus Shelling, MD;  Location: [facility] INVASIVE CV LAB;  Service: Cardiovascular;  Laterality: Right;  SFA   PERIPHERAL VASCULAR THROMBECTOMY N/A 03/16/2023   Procedure: LYSIS RECHECK;  Surgeon: Cephus Shelling, MD;  Location: [facility] INVASIVE CV LAB;  Service: Cardiovascular;  Laterality: N/A;   No family history on file. Social History   Tobacco Use   Smoking status: Never   Smokeless tobacco: Never  Substance Use Topics   Alcohol use: Not Currently   Drug use: Never   Current Outpatient Medications  Medication Sig Dispense Refill   amiodarone (PACERONE) 200 MG tablet Take 1 tablet (200 mg total) by mouth daily.  90 tablet 3   apixaban (ELIQUIS) 5 MG TABS tablet Take 1 tablet (5 mg total) by mouth 2 (two) times daily. 90 tablet 1   atorvastatin (LIPITOR) 40 MG tablet Take 1 tablet (40 mg total) by mouth daily. 90 tablet 1   metoprolol tartrate (LOPRESSOR) 100 MG tablet Take 1 tablet (100 mg total) by mouth 2 (two) times daily. 90 tablet 1   pantoprazole (PROTONIX) 40 MG tablet Take 1 tablet (40 mg total) by mouth 2 (two) times daily. 60 tablet 11   polyethylene glycol (MIRALAX / GLYCOLAX) 17 g packet Take 17 g by mouth daily as needed for mild constipation. 14 each 0   sucralfate (CARAFATE) 1 GM/10ML suspension Take 10 mLs (1 g total) by mouth every 6 (six) hours. 414 mL 0   furosemide (LASIX) 20 MG tablet Take 1 tablet (20 mg total) by mouth every Monday, Wednesday, and Friday. 40 tablet 3   potassium chloride SA (KLOR-CON M) 20 MEQ tablet Take 1 tablet (20 mEq total) by mouth every Monday, Wednesday, and Friday for 14 days. With lasix 14 tablet 0   No current facility-administered medications for this visit.   Allergies  Allergen Reactions   Penicillin G Hives     Review of Systems: All systems reviewed and negative except where noted in HPI.  Lab Results  Component Value Date   WBC 7.5 05/08/2023   HGB 10.9 (L) 05/08/2023   HCT 32.7 (L) 05/08/2023   MCV 89.3 05/08/2023   PLT 269 05/08/2023      Physical Exam: BP 118/62   Pulse 68   Ht 5\' 5"  (1.651 m)   Wt 142 lb (64.4 kg)   BMI 23.63 kg/m  Constitutional: Pleasant female in no acute distress. On oxygen Neurological: Alert and oriented to person place and time. Psychiatric: Normal mood and affect. Behavior is normal.   ASSESSMENT: 88 y.o. female here for assessment of the following  1. Gastroesophageal reflux disease with esophagitis and hemorrhage   2. Barrett's esophagus without dysplasia   3. Hiatal hernia   4. Anticoagulated   5. Supplemental oxygen dependent    Severe esophagitis in the setting of a large hiatal  hernia with long segment Barrett's causing GI bleeding during hospitalization while she was anticoagulated.  Fortunately she resolved her bleeding with PPI alone and did not require any endoscopic intervention (nothing focal to treat this was diffusely ulcerated).  Unfortunately her course has been quite complicated lately with readmission for respiratory failure, CHF, A-fib with RVR.  She is now oxygen dependent and debilitated.  We discussed her endoscopy findings.  She has a long segment of Barrett's esophagus, at risk for esophageal cancer, unable to assess for dysplasia or high risk features at the time of her last endoscopy given the extent of ulceration.  We discussed that normally this finding would warrant a follow-up endoscopy with close surveillance after she has been on PPI for some time.  However, given her comorbidities and age with significant other medical problems she is very high risk for elective anesthesia.  I feel the risks of further endoscopy outweigh the benefits currently.  She and family today in the office agree.  She has an extremely large hiatal hernia which is leading to this issue.  She is not a candidate for surgical repair of that.  I recommend indefinite high-dose PPI to prevent recurrent GI bleeding in the future, and hopefully minimize her risk for progression of Barrett's.  She is tolerating Protonix well, no further bleeding, hemoglobin stable, and she is eating well without any dysphagia.  She is at risk for dysphagia from stricturing of her lower esophagus in the setting of ulceration but she has not had problems with that yet.  They understand the issues at play, agree that she is very high risk for anesthesia and we will hold off on elective endoscopy at this time and continue conservative measures with Protonix.  She should take this 40 mg twice daily.  Discussed long-term risks of chronic PPI and they understand this, I feel benefits outweigh risks.  PLAN: -  continue protonix 40mg  BID indefinitely - high risk for recurrent GI bleeding and recurrent esophagitis off PPI in the setting of anticoagulation - no plans for surveillance EGD at this time given age and medical problems as we discussed at length in the office today. Family and patient in agreement - counseled on BE, risks of this and risks of long term PPI - they understand, continue protonix - recommend they contact the cardiology office if they are confused about dosing of her Lasix currently  Harlin Rain, MD [facility]

## 2023-05-25 NOTE — Telephone Encounter (Signed)
Pts nephew/ caregiver advised her lab results and I advised him that once I hear back from [facility] NP re: the Lasix and K I will call him and leave him a message and send in to the Pharmacy if she is to continue Mon, Wed, and Fri.   He agreed to the EP referral.

## 2023-05-25 NOTE — Telephone Encounter (Signed)
Lvm for April of [place]. [facility] [phone].

## 2023-05-25 NOTE — Telephone Encounter (Signed)
Pt c/o medication issue:  1. Name of Medication:  potassium chloride SA (KLOR-CON M) 20 MEQ tablet   2. How are you currently taking this medication (dosage and times per day)?   3. Are you having a reaction (difficulty breathing--STAT)?   4. What is your medication issue? Nephew wants to know if patient should still be taking this medication.  Please advise.

## 2023-05-25 NOTE — Telephone Encounter (Signed)
She can continue lasix and potassium on Mon, Wed and Friday. Please send refill of potassium. Thank you!

## 2023-05-25 NOTE — Telephone Encounter (Signed)
Per lab results from today:    [name], NP 05/25/2023  8:29 AM EST Back to Top    Please let Ms. Capozzi know that her kidney function is stable and her electrolytes are stable. Her labs show that her thyroid stimulating hormone is high, unfortunately this can be caused by amiodarone. Would recommend urgent referral to our EP team to discuss better rate control options for atrial fibrillation. Please have her stop amiodarone and continue to monitor her heart rate and symptoms at home.    Urgent referral placed for EP.   Message sent to [facility] to clarify if the pt should stay on Lasix and K three times per week.

## 2023-05-26 DIAGNOSIS — S90822D Blister (nonthermal), left foot, subsequent encounter: Secondary | ICD-10-CM | POA: Diagnosis not present

## 2023-05-26 DIAGNOSIS — I82521 Chronic embolism and thrombosis of right iliac vein: Secondary | ICD-10-CM | POA: Diagnosis not present

## 2023-05-26 DIAGNOSIS — K551 Chronic vascular disorders of intestine: Secondary | ICD-10-CM | POA: Diagnosis not present

## 2023-05-26 DIAGNOSIS — I872 Venous insufficiency (chronic) (peripheral): Secondary | ICD-10-CM | POA: Diagnosis not present

## 2023-05-26 DIAGNOSIS — E1151 Type 2 diabetes mellitus with diabetic peripheral angiopathy without gangrene: Secondary | ICD-10-CM | POA: Diagnosis not present

## 2023-05-26 DIAGNOSIS — S51801D Unspecified open wound of right forearm, subsequent encounter: Secondary | ICD-10-CM | POA: Diagnosis not present

## 2023-05-26 DIAGNOSIS — I701 Atherosclerosis of renal artery: Secondary | ICD-10-CM | POA: Diagnosis not present

## 2023-05-26 DIAGNOSIS — L97811 Non-pressure chronic ulcer of other part of right lower leg limited to breakdown of skin: Secondary | ICD-10-CM | POA: Diagnosis not present

## 2023-05-26 DIAGNOSIS — I110 Hypertensive heart disease with heart failure: Secondary | ICD-10-CM | POA: Diagnosis not present

## 2023-05-26 DIAGNOSIS — J9859 Other diseases of mediastinum, not elsewhere classified: Secondary | ICD-10-CM | POA: Diagnosis not present

## 2023-05-26 DIAGNOSIS — I82432 Acute embolism and thrombosis of left popliteal vein: Secondary | ICD-10-CM | POA: Diagnosis not present

## 2023-05-26 DIAGNOSIS — I70223 Atherosclerosis of native arteries of extremities with rest pain, bilateral legs: Secondary | ICD-10-CM | POA: Diagnosis not present

## 2023-05-26 DIAGNOSIS — I82411 Acute embolism and thrombosis of right femoral vein: Secondary | ICD-10-CM | POA: Diagnosis not present

## 2023-05-26 DIAGNOSIS — J9601 Acute respiratory failure with hypoxia: Secondary | ICD-10-CM | POA: Diagnosis not present

## 2023-05-26 DIAGNOSIS — I4819 Other persistent atrial fibrillation: Secondary | ICD-10-CM | POA: Diagnosis not present

## 2023-05-26 DIAGNOSIS — I5043 Acute on chronic combined systolic (congestive) and diastolic (congestive) heart failure: Secondary | ICD-10-CM | POA: Diagnosis not present

## 2023-05-29 DIAGNOSIS — I82521 Chronic embolism and thrombosis of right iliac vein: Secondary | ICD-10-CM | POA: Diagnosis not present

## 2023-05-29 DIAGNOSIS — I110 Hypertensive heart disease with heart failure: Secondary | ICD-10-CM | POA: Diagnosis not present

## 2023-05-29 DIAGNOSIS — E1151 Type 2 diabetes mellitus with diabetic peripheral angiopathy without gangrene: Secondary | ICD-10-CM | POA: Diagnosis not present

## 2023-05-29 DIAGNOSIS — I70223 Atherosclerosis of native arteries of extremities with rest pain, bilateral legs: Secondary | ICD-10-CM | POA: Diagnosis not present

## 2023-05-29 DIAGNOSIS — I5043 Acute on chronic combined systolic (congestive) and diastolic (congestive) heart failure: Secondary | ICD-10-CM | POA: Diagnosis not present

## 2023-05-29 DIAGNOSIS — K551 Chronic vascular disorders of intestine: Secondary | ICD-10-CM | POA: Diagnosis not present

## 2023-05-29 DIAGNOSIS — S51801D Unspecified open wound of right forearm, subsequent encounter: Secondary | ICD-10-CM | POA: Diagnosis not present

## 2023-05-29 DIAGNOSIS — I82432 Acute embolism and thrombosis of left popliteal vein: Secondary | ICD-10-CM | POA: Diagnosis not present

## 2023-05-29 DIAGNOSIS — I82411 Acute embolism and thrombosis of right femoral vein: Secondary | ICD-10-CM | POA: Diagnosis not present

## 2023-05-29 DIAGNOSIS — I872 Venous insufficiency (chronic) (peripheral): Secondary | ICD-10-CM | POA: Diagnosis not present

## 2023-05-29 DIAGNOSIS — J9859 Other diseases of mediastinum, not elsewhere classified: Secondary | ICD-10-CM | POA: Diagnosis not present

## 2023-05-29 DIAGNOSIS — I4819 Other persistent atrial fibrillation: Secondary | ICD-10-CM | POA: Diagnosis not present

## 2023-05-29 DIAGNOSIS — S90822D Blister (nonthermal), left foot, subsequent encounter: Secondary | ICD-10-CM | POA: Diagnosis not present

## 2023-05-29 DIAGNOSIS — I701 Atherosclerosis of renal artery: Secondary | ICD-10-CM | POA: Diagnosis not present

## 2023-05-29 DIAGNOSIS — L97811 Non-pressure chronic ulcer of other part of right lower leg limited to breakdown of skin: Secondary | ICD-10-CM | POA: Diagnosis not present

## 2023-05-29 DIAGNOSIS — J9601 Acute respiratory failure with hypoxia: Secondary | ICD-10-CM | POA: Diagnosis not present

## 2023-05-30 ENCOUNTER — Encounter: Payer: Self-pay | Admitting: Cardiovascular Disease

## 2023-05-30 ENCOUNTER — Ambulatory Visit: Payer: Medicare Other | Attending: Cardiovascular Disease | Admitting: Cardiovascular Disease

## 2023-05-30 VITALS — BP 114/66 | HR 71 | Ht 65.0 in | Wt 140.0 lb

## 2023-05-30 DIAGNOSIS — I70223 Atherosclerosis of native arteries of extremities with rest pain, bilateral legs: Secondary | ICD-10-CM | POA: Diagnosis not present

## 2023-05-30 DIAGNOSIS — I872 Venous insufficiency (chronic) (peripheral): Secondary | ICD-10-CM | POA: Diagnosis not present

## 2023-05-30 DIAGNOSIS — L97811 Non-pressure chronic ulcer of other part of right lower leg limited to breakdown of skin: Secondary | ICD-10-CM | POA: Diagnosis not present

## 2023-05-30 DIAGNOSIS — I4892 Unspecified atrial flutter: Secondary | ICD-10-CM

## 2023-05-30 DIAGNOSIS — J9601 Acute respiratory failure with hypoxia: Secondary | ICD-10-CM | POA: Diagnosis not present

## 2023-05-30 DIAGNOSIS — S90822D Blister (nonthermal), left foot, subsequent encounter: Secondary | ICD-10-CM | POA: Diagnosis not present

## 2023-05-30 DIAGNOSIS — K551 Chronic vascular disorders of intestine: Secondary | ICD-10-CM | POA: Diagnosis not present

## 2023-05-30 DIAGNOSIS — I110 Hypertensive heart disease with heart failure: Secondary | ICD-10-CM | POA: Diagnosis not present

## 2023-05-30 DIAGNOSIS — I82432 Acute embolism and thrombosis of left popliteal vein: Secondary | ICD-10-CM | POA: Diagnosis not present

## 2023-05-30 DIAGNOSIS — J9859 Other diseases of mediastinum, not elsewhere classified: Secondary | ICD-10-CM | POA: Diagnosis not present

## 2023-05-30 DIAGNOSIS — I82411 Acute embolism and thrombosis of right femoral vein: Secondary | ICD-10-CM | POA: Diagnosis not present

## 2023-05-30 DIAGNOSIS — I701 Atherosclerosis of renal artery: Secondary | ICD-10-CM | POA: Diagnosis not present

## 2023-05-30 DIAGNOSIS — S51801D Unspecified open wound of right forearm, subsequent encounter: Secondary | ICD-10-CM | POA: Diagnosis not present

## 2023-05-30 DIAGNOSIS — I4819 Other persistent atrial fibrillation: Secondary | ICD-10-CM | POA: Diagnosis not present

## 2023-05-30 DIAGNOSIS — E1151 Type 2 diabetes mellitus with diabetic peripheral angiopathy without gangrene: Secondary | ICD-10-CM | POA: Diagnosis not present

## 2023-05-30 DIAGNOSIS — I82521 Chronic embolism and thrombosis of right iliac vein: Secondary | ICD-10-CM | POA: Diagnosis not present

## 2023-05-30 DIAGNOSIS — I5043 Acute on chronic combined systolic (congestive) and diastolic (congestive) heart failure: Secondary | ICD-10-CM | POA: Diagnosis not present

## 2023-05-30 NOTE — Patient Instructions (Signed)
Medication Instructions:  Your physician recommends that you continue on your current medications as directed. Please refer to the Current Medication list given to you today. *If you need a refill on your cardiac medications before your next appointment, please call your pharmacy*   Lab Work: CBC to be completed today - ordered by Lindwood Qua, NP If you have labs (blood work) drawn today and your tests are completely normal, you will receive your results only by: MyChart Message (if you have MyChart) OR A paper copy in the mail If you have any lab test that is abnormal or we need to change your treatment, we will call you to review the results.   Follow-Up: At [facility], you and your health needs are our priority.  As part of our continuing mission to provide you with exceptional heart care, we have created designated Provider Care Teams.  These Care Teams include your primary Cardiologist (physician) and Advanced Practice Providers (APPs -  Physician Assistants and Nurse Practitioners) who all work together to provide you with the care you need, when you need it.  We recommend signing up for the patient portal called "MyChart".  Sign up information is provided on this After Visit Summary.  MyChart is used to connect with patients for Virtual Visits (Telemedicine).  Patients are able to view lab/test results, encounter notes, upcoming appointments, etc.  Non-urgent messages can be sent to your provider as well.   To learn more about what you can do with MyChart, go to [email or web address].    Your next appointment:   Follow up with general cardiology as instructed - 4 month with Dr Tenny Craw  Provider:   Dietrich Pates, MD

## 2023-05-30 NOTE — Progress Notes (Signed)
  Electrophysiology Office Note:    Date:  05/30/2023   ID:  Sarah Wolf [id number], MRN [id number]  PCP:  Ivonne Andrew, NP   [facility] Providers Cardiologist:  Dietrich Pates, MD     Referring MD: Reather Littler D, NP   History of Present Illness:    Sarah Wolf is a 88 y.o. female with a medical history significant for persistent atrial fibrillation, atrial flutter, CHFmrEF, peripheral arterial disease, DVT referred for AF management.     Atrial fibrillation was noted as late as 2020 at the time of an echocardiogram but not addressed.  She was admitted with right leg pain in August 2024 and found to have arterial occlusion and atrial fibrillation with RVR.  Echocardiogram admission showed an ejection fraction of 40 to 45%.  She required thrombolysis and SFA stenting.  Her course was complicated by cephalopathy, respiratory failure due to to aspiration, coffee-ground emesis with esophagitis and gastritis.  She was started on amiodarone.  This was discontinued due to abnormal thyroid studies.  She declined cardioversion and is not interested in having any further procedures.       Today she reports that she is doing very well -- " for an old woman."  EKGs/Labs/Other Studies Reviewed Today:     Echocardiogram:  TTE May 05, 2023 EF 50 to 55%.  Mildly reduced RV systolic function.  Left atrium moderately dilated.  Right atrium mildly dilated.  Small pericardial effusion is present    EKG:   EKG Interpretation Date/Time:  Tuesday May 30 2023 11:58:52 EST Ventricular Rate:  71 PR Interval:    QRS Duration:  82 QT Interval:  368 QTC Calculation: 399 R Axis:   38  Text Interpretation: Atrial flutter with variable A-V block Low voltage QRS Septal infarct (cited on or before 30-May-2023) T wave abnormality, consider inferior ischemia When compared with ECG of 23-May-2023 15:11, No significant change was found Confirmed by York Pellant [phone]) on  05/30/2023 12:09:26 PM     Physical Exam:    VS:  BP 114/66 (BP Location: Left Arm, Patient Position: Sitting, Cuff Size: Normal)   Pulse 71   Ht 5\' 5"  (1.651 m)   Wt 140 lb (63.5 kg)   SpO2 (!) 85%   BMI 23.30 kg/m     Wt Readings from Last 3 Encounters:  05/30/23 140 lb (63.5 kg)  05/25/23 142 lb (64.4 kg)  05/23/23 144 lb (65.3 kg)     GEN: Frail and elderly but well nourished, well developed, and in no acute distress CARDIAC: iRRR, no murmurs, rubs, gallops RESPIRATORY:  Normal work of breathing MUSCULOSKELETAL: trace edema    ASSESSMENT & PLAN:     Persistent atrial fibrillation Due to her age, frailty, comorbidities, and her own preferences, I do not plan for any rhythm control efforts She is not interested in further evaluation or considering pacemaker AVJ ablation for rate control Her pulse rate is ideal currently I have asked her to have her caretakers maintain a log of her heart rates for further guidance She may follow-up with internal cardiology clinic for ongoing rate control  Secondary hypercoagulable state Recent history of limb ischemia requiring intervention Continue apixaban appropriately dosed at 5 mg p.o. twice daily    Signed, Maurice Small, MD  05/30/2023 12:33 PM    [facility]

## 2023-05-31 ENCOUNTER — Telehealth: Payer: Self-pay | Admitting: Cardiovascular Disease

## 2023-05-31 NOTE — Telephone Encounter (Signed)
Nephew called and said pt is refusing to do lab work  and wanted to make provider  aware

## 2023-05-31 NOTE — Telephone Encounter (Signed)
I spoke with patient's nephew.  He reports patient was going to have lab work done after office visit with Dr Nelly Laurence.  On the way to the lab on the first floor patient made nephew aware she was not going to have the lab work done.  Lab work was a CBC ordered by Reather Littler, NP

## 2023-06-01 DIAGNOSIS — I82411 Acute embolism and thrombosis of right femoral vein: Secondary | ICD-10-CM | POA: Diagnosis not present

## 2023-06-01 DIAGNOSIS — E1151 Type 2 diabetes mellitus with diabetic peripheral angiopathy without gangrene: Secondary | ICD-10-CM | POA: Diagnosis not present

## 2023-06-01 DIAGNOSIS — I70223 Atherosclerosis of native arteries of extremities with rest pain, bilateral legs: Secondary | ICD-10-CM | POA: Diagnosis not present

## 2023-06-01 DIAGNOSIS — J9601 Acute respiratory failure with hypoxia: Secondary | ICD-10-CM | POA: Diagnosis not present

## 2023-06-01 DIAGNOSIS — I872 Venous insufficiency (chronic) (peripheral): Secondary | ICD-10-CM | POA: Diagnosis not present

## 2023-06-01 DIAGNOSIS — I82521 Chronic embolism and thrombosis of right iliac vein: Secondary | ICD-10-CM | POA: Diagnosis not present

## 2023-06-01 DIAGNOSIS — J9859 Other diseases of mediastinum, not elsewhere classified: Secondary | ICD-10-CM | POA: Diagnosis not present

## 2023-06-01 DIAGNOSIS — I82432 Acute embolism and thrombosis of left popliteal vein: Secondary | ICD-10-CM | POA: Diagnosis not present

## 2023-06-01 DIAGNOSIS — I4819 Other persistent atrial fibrillation: Secondary | ICD-10-CM | POA: Diagnosis not present

## 2023-06-01 DIAGNOSIS — I701 Atherosclerosis of renal artery: Secondary | ICD-10-CM | POA: Diagnosis not present

## 2023-06-01 DIAGNOSIS — I110 Hypertensive heart disease with heart failure: Secondary | ICD-10-CM | POA: Diagnosis not present

## 2023-06-01 DIAGNOSIS — S90822D Blister (nonthermal), left foot, subsequent encounter: Secondary | ICD-10-CM | POA: Diagnosis not present

## 2023-06-01 DIAGNOSIS — I5043 Acute on chronic combined systolic (congestive) and diastolic (congestive) heart failure: Secondary | ICD-10-CM | POA: Diagnosis not present

## 2023-06-01 DIAGNOSIS — L97811 Non-pressure chronic ulcer of other part of right lower leg limited to breakdown of skin: Secondary | ICD-10-CM | POA: Diagnosis not present

## 2023-06-01 DIAGNOSIS — S51801D Unspecified open wound of right forearm, subsequent encounter: Secondary | ICD-10-CM | POA: Diagnosis not present

## 2023-06-01 DIAGNOSIS — K551 Chronic vascular disorders of intestine: Secondary | ICD-10-CM | POA: Diagnosis not present

## 2023-06-05 ENCOUNTER — Emergency Department (HOSPITAL_COMMUNITY): Payer: Medicare Other

## 2023-06-05 ENCOUNTER — Observation Stay (HOSPITAL_COMMUNITY)
Admission: EM | Admit: 2023-06-05 | Discharge: 2023-06-09 | Disposition: A | Discharge status: Skilled Nursing Facility | Payer: Medicare Other | Attending: Internal Medicine | Admitting: Internal Medicine

## 2023-06-05 ENCOUNTER — Telehealth: Payer: Self-pay

## 2023-06-05 ENCOUNTER — Encounter (HOSPITAL_COMMUNITY): Payer: Self-pay | Admitting: Emergency Medicine

## 2023-06-05 ENCOUNTER — Other Ambulatory Visit: Payer: Self-pay

## 2023-06-05 DIAGNOSIS — I4819 Other persistent atrial fibrillation: Secondary | ICD-10-CM | POA: Diagnosis not present

## 2023-06-05 DIAGNOSIS — Z87898 Personal history of other specified conditions: Secondary | ICD-10-CM

## 2023-06-05 DIAGNOSIS — I3139 Other pericardial effusion (noninflammatory): Secondary | ICD-10-CM | POA: Diagnosis not present

## 2023-06-05 DIAGNOSIS — N1831 Chronic kidney disease, stage 3a: Secondary | ICD-10-CM | POA: Insufficient documentation

## 2023-06-05 DIAGNOSIS — Z66 Do not resuscitate: Secondary | ICD-10-CM | POA: Insufficient documentation

## 2023-06-05 DIAGNOSIS — R1310 Dysphagia, unspecified: Secondary | ICD-10-CM | POA: Diagnosis not present

## 2023-06-05 DIAGNOSIS — I4811 Longstanding persistent atrial fibrillation: Secondary | ICD-10-CM | POA: Insufficient documentation

## 2023-06-05 DIAGNOSIS — I739 Peripheral vascular disease, unspecified: Secondary | ICD-10-CM | POA: Diagnosis not present

## 2023-06-05 DIAGNOSIS — J984 Other disorders of lung: Secondary | ICD-10-CM | POA: Diagnosis not present

## 2023-06-05 DIAGNOSIS — J9859 Other diseases of mediastinum, not elsewhere classified: Secondary | ICD-10-CM | POA: Diagnosis not present

## 2023-06-05 DIAGNOSIS — Z79899 Other long term (current) drug therapy: Secondary | ICD-10-CM | POA: Diagnosis not present

## 2023-06-05 DIAGNOSIS — E876 Hypokalemia: Secondary | ICD-10-CM | POA: Diagnosis not present

## 2023-06-05 DIAGNOSIS — Z8719 Personal history of other diseases of the digestive system: Secondary | ICD-10-CM | POA: Insufficient documentation

## 2023-06-05 DIAGNOSIS — S12100A Unspecified displaced fracture of second cervical vertebra, initial encounter for closed fracture: Secondary | ICD-10-CM | POA: Diagnosis not present

## 2023-06-05 DIAGNOSIS — S199XXA Unspecified injury of neck, initial encounter: Secondary | ICD-10-CM | POA: Diagnosis not present

## 2023-06-05 DIAGNOSIS — S12110A Anterior displaced Type II dens fracture, initial encounter for closed fracture: Secondary | ICD-10-CM

## 2023-06-05 DIAGNOSIS — W19XXXA Unspecified fall, initial encounter: Secondary | ICD-10-CM

## 2023-06-05 DIAGNOSIS — R262 Difficulty in walking, not elsewhere classified: Secondary | ICD-10-CM | POA: Insufficient documentation

## 2023-06-05 DIAGNOSIS — S161XXA Strain of muscle, fascia and tendon at neck level, initial encounter: Secondary | ICD-10-CM

## 2023-06-05 DIAGNOSIS — R9089 Other abnormal findings on diagnostic imaging of central nervous system: Secondary | ICD-10-CM | POA: Diagnosis not present

## 2023-06-05 DIAGNOSIS — I701 Atherosclerosis of renal artery: Secondary | ICD-10-CM | POA: Diagnosis not present

## 2023-06-05 DIAGNOSIS — I872 Venous insufficiency (chronic) (peripheral): Secondary | ICD-10-CM | POA: Diagnosis not present

## 2023-06-05 DIAGNOSIS — I110 Hypertensive heart disease with heart failure: Secondary | ICD-10-CM | POA: Diagnosis not present

## 2023-06-05 DIAGNOSIS — M542 Cervicalgia: Secondary | ICD-10-CM | POA: Diagnosis not present

## 2023-06-05 DIAGNOSIS — I130 Hypertensive heart and chronic kidney disease with heart failure and stage 1 through stage 4 chronic kidney disease, or unspecified chronic kidney disease: Secondary | ICD-10-CM | POA: Diagnosis not present

## 2023-06-05 DIAGNOSIS — I82432 Acute embolism and thrombosis of left popliteal vein: Secondary | ICD-10-CM | POA: Diagnosis not present

## 2023-06-05 DIAGNOSIS — I6782 Cerebral ischemia: Secondary | ICD-10-CM | POA: Diagnosis not present

## 2023-06-05 DIAGNOSIS — S0990XA Unspecified injury of head, initial encounter: Secondary | ICD-10-CM | POA: Diagnosis not present

## 2023-06-05 DIAGNOSIS — K219 Gastro-esophageal reflux disease without esophagitis: Secondary | ICD-10-CM | POA: Insufficient documentation

## 2023-06-05 DIAGNOSIS — Y92009 Unspecified place in unspecified non-institutional (private) residence as the place of occurrence of the external cause: Secondary | ICD-10-CM

## 2023-06-05 DIAGNOSIS — I5043 Acute on chronic combined systolic (congestive) and diastolic (congestive) heart failure: Secondary | ICD-10-CM | POA: Diagnosis not present

## 2023-06-05 DIAGNOSIS — R7989 Other specified abnormal findings of blood chemistry: Secondary | ICD-10-CM

## 2023-06-05 DIAGNOSIS — F0390 Unspecified dementia without behavioral disturbance: Secondary | ICD-10-CM | POA: Diagnosis not present

## 2023-06-05 DIAGNOSIS — R946 Abnormal results of thyroid function studies: Secondary | ICD-10-CM | POA: Insufficient documentation

## 2023-06-05 DIAGNOSIS — R410 Disorientation, unspecified: Secondary | ICD-10-CM | POA: Diagnosis not present

## 2023-06-05 DIAGNOSIS — L97811 Non-pressure chronic ulcer of other part of right lower leg limited to breakdown of skin: Secondary | ICD-10-CM | POA: Diagnosis not present

## 2023-06-05 DIAGNOSIS — Y92019 Unspecified place in single-family (private) house as the place of occurrence of the external cause: Secondary | ICD-10-CM | POA: Diagnosis not present

## 2023-06-05 DIAGNOSIS — M6281 Muscle weakness (generalized): Secondary | ICD-10-CM | POA: Diagnosis not present

## 2023-06-05 DIAGNOSIS — E7800 Pure hypercholesterolemia, unspecified: Secondary | ICD-10-CM | POA: Diagnosis present

## 2023-06-05 DIAGNOSIS — I82521 Chronic embolism and thrombosis of right iliac vein: Secondary | ICD-10-CM | POA: Diagnosis not present

## 2023-06-05 DIAGNOSIS — S51801D Unspecified open wound of right forearm, subsequent encounter: Secondary | ICD-10-CM | POA: Diagnosis not present

## 2023-06-05 DIAGNOSIS — R0602 Shortness of breath: Secondary | ICD-10-CM | POA: Diagnosis not present

## 2023-06-05 DIAGNOSIS — J9611 Chronic respiratory failure with hypoxia: Secondary | ICD-10-CM | POA: Diagnosis not present

## 2023-06-05 DIAGNOSIS — J9601 Acute respiratory failure with hypoxia: Secondary | ICD-10-CM | POA: Diagnosis not present

## 2023-06-05 DIAGNOSIS — I2723 Pulmonary hypertension due to lung diseases and hypoxia: Secondary | ICD-10-CM | POA: Diagnosis not present

## 2023-06-05 DIAGNOSIS — I700 Atherosclerosis of aorta: Secondary | ICD-10-CM | POA: Diagnosis not present

## 2023-06-05 DIAGNOSIS — E1151 Type 2 diabetes mellitus with diabetic peripheral angiopathy without gangrene: Secondary | ICD-10-CM | POA: Diagnosis not present

## 2023-06-05 DIAGNOSIS — S12112A Nondisplaced Type II dens fracture, initial encounter for closed fracture: Secondary | ICD-10-CM | POA: Diagnosis not present

## 2023-06-05 DIAGNOSIS — J90 Pleural effusion, not elsewhere classified: Secondary | ICD-10-CM | POA: Diagnosis not present

## 2023-06-05 DIAGNOSIS — I70223 Atherosclerosis of native arteries of extremities with rest pain, bilateral legs: Secondary | ICD-10-CM | POA: Diagnosis not present

## 2023-06-05 DIAGNOSIS — I6529 Occlusion and stenosis of unspecified carotid artery: Secondary | ICD-10-CM | POA: Diagnosis not present

## 2023-06-05 DIAGNOSIS — I5042 Chronic combined systolic (congestive) and diastolic (congestive) heart failure: Secondary | ICD-10-CM | POA: Diagnosis not present

## 2023-06-05 DIAGNOSIS — S90822D Blister (nonthermal), left foot, subsequent encounter: Secondary | ICD-10-CM | POA: Diagnosis not present

## 2023-06-05 DIAGNOSIS — I6501 Occlusion and stenosis of right vertebral artery: Secondary | ICD-10-CM | POA: Diagnosis not present

## 2023-06-05 DIAGNOSIS — R42 Dizziness and giddiness: Secondary | ICD-10-CM | POA: Diagnosis not present

## 2023-06-05 DIAGNOSIS — K551 Chronic vascular disorders of intestine: Secondary | ICD-10-CM | POA: Diagnosis not present

## 2023-06-05 DIAGNOSIS — I82411 Acute embolism and thrombosis of right femoral vein: Secondary | ICD-10-CM | POA: Diagnosis not present

## 2023-06-05 LAB — CBC WITH DIFFERENTIAL/PLATELET
Abs Immature Granulocytes: 0.02 10*3/uL (ref 0.00–0.07)
Basophils Absolute: 0 10*3/uL (ref 0.0–0.1)
Basophils Relative: 0 %
Eosinophils Absolute: 0 10*3/uL (ref 0.0–0.5)
Eosinophils Relative: 0 %
HCT: 36.5 % (ref 36.0–46.0)
Hemoglobin: 11.3 g/dL — ABNORMAL LOW (ref 12.0–15.0)
Immature Granulocytes: 0 %
Lymphocytes Relative: 11 %
Lymphs Abs: 0.8 10*3/uL (ref 0.7–4.0)
MCH: 29 pg (ref 26.0–34.0)
MCHC: 31 g/dL (ref 30.0–36.0)
MCV: 93.6 fL (ref 80.0–100.0)
Monocytes Absolute: 0.3 10*3/uL (ref 0.1–1.0)
Monocytes Relative: 5 %
Neutro Abs: 5.7 10*3/uL (ref 1.7–7.7)
Neutrophils Relative %: 84 %
Platelets: 251 10*3/uL (ref 150–400)
RBC: 3.9 MIL/uL (ref 3.87–5.11)
RDW: 17.2 % — ABNORMAL HIGH (ref 11.5–15.5)
WBC: 6.9 10*3/uL (ref 4.0–10.5)
nRBC: 0 % (ref 0.0–0.2)

## 2023-06-05 LAB — BASIC METABOLIC PANEL
Anion gap: 11 (ref 5–15)
BUN: 16 mg/dL (ref 8–23)
CO2: 29 mmol/L (ref 22–32)
Calcium: 8.7 mg/dL — ABNORMAL LOW (ref 8.9–10.3)
Chloride: 95 mmol/L — ABNORMAL LOW (ref 98–111)
Creatinine, Ser: 0.86 mg/dL (ref 0.44–1.00)
GFR, Estimated: >60 mL/min (ref >60)
Glucose, Bld: 117 mg/dL — ABNORMAL HIGH (ref 70–99)
Potassium: 3 mmol/L — ABNORMAL LOW (ref 3.5–5.1)
Sodium: 135 mmol/L (ref 135–145)

## 2023-06-05 MED ORDER — ATORVASTATIN CALCIUM 40 MG PO TABS
40.0000 mg | ORAL_TABLET | Freq: Every day | ORAL | Status: DC
  Administered 2023-06-05 – 2023-06-09 (×5): 40 mg via ORAL
  Filled 2023-06-05 (×5): qty 1

## 2023-06-05 MED ORDER — IOHEXOL 350 MG/ML SOLN
75.0000 mL | Freq: Once | INTRAVENOUS | Status: AC | PRN
  Administered 2023-06-05: 75 mL via INTRAVENOUS

## 2023-06-05 MED ORDER — ACETAMINOPHEN 325 MG PO TABS
650.0000 mg | ORAL_TABLET | Freq: Four times a day (QID) | ORAL | Status: DC | PRN
  Administered 2023-06-09: 650 mg via ORAL
  Filled 2023-06-05: qty 2

## 2023-06-05 MED ORDER — FUROSEMIDE 20 MG PO TABS
20.0000 mg | ORAL_TABLET | ORAL | Status: DC
  Administered 2023-06-05: 20 mg via ORAL
  Filled 2023-06-05: qty 1

## 2023-06-05 MED ORDER — SUCRALFATE 1 GM/10ML PO SUSP
1.0000 g | Freq: Two times a day (BID) | ORAL | Status: DC
  Administered 2023-06-05 – 2023-06-09 (×8): 1 g via ORAL
  Filled 2023-06-05 (×8): qty 10

## 2023-06-05 MED ORDER — POTASSIUM CHLORIDE CRYS ER 20 MEQ PO TBCR
20.0000 meq | EXTENDED_RELEASE_TABLET | ORAL | Status: DC
  Administered 2023-06-05 – 2023-06-09 (×3): 20 meq via ORAL
  Filled 2023-06-05 (×3): qty 1

## 2023-06-05 MED ORDER — POTASSIUM CHLORIDE CRYS ER 20 MEQ PO TBCR
40.0000 meq | EXTENDED_RELEASE_TABLET | Freq: Once | ORAL | Status: AC
  Administered 2023-06-05: 40 meq via ORAL
  Filled 2023-06-05: qty 2

## 2023-06-05 MED ORDER — METOPROLOL TARTRATE 50 MG PO TABS
100.0000 mg | ORAL_TABLET | Freq: Two times a day (BID) | ORAL | Status: DC
  Administered 2023-06-05 – 2023-06-09 (×8): 100 mg via ORAL
  Filled 2023-06-05 (×4): qty 2
  Filled 2023-06-05: qty 4
  Filled 2023-06-05: qty 2
  Filled 2023-06-05: qty 4
  Filled 2023-06-05: qty 2

## 2023-06-05 MED ORDER — MORPHINE SULFATE (PF) 2 MG/ML IV SOLN
2.0000 mg | Freq: Once | INTRAVENOUS | Status: AC
  Administered 2023-06-06: 2 mg via INTRAVENOUS
  Filled 2023-06-05: qty 1

## 2023-06-05 MED ORDER — PANTOPRAZOLE SODIUM 40 MG PO TBEC
40.0000 mg | DELAYED_RELEASE_TABLET | Freq: Two times a day (BID) | ORAL | Status: DC
  Administered 2023-06-05 – 2023-06-09 (×8): 40 mg via ORAL
  Filled 2023-06-05 (×8): qty 1

## 2023-06-05 MED ORDER — HYDROCODONE-ACETAMINOPHEN 5-325 MG PO TABS
1.0000 | ORAL_TABLET | Freq: Four times a day (QID) | ORAL | Status: DC | PRN
  Administered 2023-06-05 – 2023-06-08 (×4): 1 via ORAL
  Filled 2023-06-05 (×4): qty 1

## 2023-06-05 NOTE — Telephone Encounter (Signed)
-----   Message from [name] sent at 06/05/2023  4:27 PM EST ----- Please let Sarah Wolf know that her forearm xray shows no evidence of fracture. Would recommend follow up with PCP for for any ongoing concerns related to arm swelling or pain.

## 2023-06-05 NOTE — ED Notes (Addendum)
C-collar appropriately repositioned by this RN, educated to leave c-collar in place.

## 2023-06-05 NOTE — Assessment & Plan Note (Signed)
Continue statin. 

## 2023-06-05 NOTE — ED Notes (Signed)
IV team at bedside 

## 2023-06-05 NOTE — H&P (Signed)
History and Physical    Patient: Sarah Wolf DOB: [birth year] DOA: 06/05/2023 DOS: the patient was seen and examined on 06/05/2023 PCP: Ivonne Andrew, NP  Patient coming from: Home  Chief Complaint:  Chief Complaint  Patient presents with   Fall   HPI: Sarah Wolf is a 88 y.o. female with medical history significant of combined systolic and diastolic CHF (EF 23-55% on 04/2023), HTN, persistent A.fib on Eliquis, hx of DVT (2020), HTN, PAD s/p Rt SFA stent, chronic hypoxic respiratory failure though secondary to large hiatal hernia who presents with recurrent falls.  Patient reports turning around after putting dishes away and just lose balance and hit her head on the refrigerator. No loss of consciousness. Can't really describe if she was dizzy or just felt off balance. She was using her walker. Thinks she had a total of 3 other falls this past month but can't recall why. She had a life alarm necklace and was able to call for help. She lives alone but has nephew that visits and helps with her medication and finance.  She is otherwise independent with all ADLs and still drives to grocery stores. She denies any chest pain, shortness of breath.  No recent nausea, vomiting diarrhea or constipation.  Per nephew he is unsure if her history is entirely accurate. Pt has complained to him on numerous occasions that she has felt dizzy.  Today when he found her her walker was far from her and did not appear that she was using it.  He thinks that perhaps she became dizzy and was stumbling around trying to keep her balance.  He also noticed her oxygen was off so unsure of her compliance.  On arrival to ED, she was afebrile, BP elevated at 154/109 and stable on room air.  CBC without leukocytosis, hemoglobin of 11.3.  CMP notable for mild hypokalemia of 3, stable creatinine of 0.87, normal LFTs.  CT head demonstrated minimally displaced type II dens fracture with fracture on the left  extending to the left transverse foramen. CTA of the head neck was obtained to exclude vascular injury which showed no evidence of arterial injury in the neck however it did reveal a large pericardial effusion that is probably increased from chest CT done a month ago.  There is also a large left pleural effusion with volume loss of the left lung.  ED consulted neurosurgery on-call Dr. Wynetta Emery who recommended collar and to follow-up in 1 week. Review of Systems: As mentioned in the history of present illness. All other systems reviewed and are negative. Past Medical History:  Diagnosis Date   Atherosclerosis of native arteries of extremities with rest pain, right leg (HCC)    CHF (congestive heart failure) (HCC)    Diaphragmatic hernia without obstruction or gangrene    GERD (gastroesophageal reflux disease)    History of DVT (deep vein thrombosis)    Hypertension    Peripheral arterial disease (HCC)    Persistent atrial fibrillation (HCC)    Personal history of peptic ulcer disease    Type 2 diabetes mellitus with diabetic peripheral angiopathy without gangrene (HCC)    Unspecified hearing loss, bilateral    Past Surgical History:  Procedure Laterality Date   ABDOMINAL AORTOGRAM W/LOWER EXTREMITY N/A 03/15/2023   Procedure: ABDOMINAL AORTOGRAM W/LOWER EXTREMITY;  Surgeon: Victorino Sparrow, MD;  Location: [facility] INVASIVE CV LAB;  Service: Cardiovascular;  Laterality: N/A;   BIOPSY  03/26/2023   Procedure: BIOPSY;  Surgeon: Ileene Patrick  P, MD;  Location: [facility] ENDOSCOPY;  Service: Gastroenterology;;   ESOPHAGOGASTRODUODENOSCOPY (EGD) WITH PROPOFOL N/A 03/26/2023   Procedure: ESOPHAGOGASTRODUODENOSCOPY (EGD) WITH PROPOFOL;  Surgeon: Benancio Deeds, MD;  Location: [facility] ENDOSCOPY;  Service: Gastroenterology;  Laterality: N/A;   ORIF FEMUR FRACTURE Right 03/08/2019   Procedure: OPEN REDUCTION INTERNAL FIXATION (ORIF) DISTAL FEMUR FRACTURE;  Surgeon: Kathryne Hitch, MD;  Location: [facility] ORS;   Service: Orthopedics;  Laterality: Right;   PERIPHERAL VASCULAR INTERVENTION Right 03/16/2023   Procedure: PERIPHERAL VASCULAR INTERVENTION;  Surgeon: Cephus Shelling, MD;  Location: [facility] INVASIVE CV LAB;  Service: Cardiovascular;  Laterality: Right;  SFA   PERIPHERAL VASCULAR THROMBECTOMY N/A 03/16/2023   Procedure: LYSIS RECHECK;  Surgeon: Cephus Shelling, MD;  Location: [facility] INVASIVE CV LAB;  Service: Cardiovascular;  Laterality: N/A;   Social History:  reports that she has never smoked. She has never used smokeless tobacco. She reports that she does not currently use alcohol. She reports that she does not use drugs.  Allergies  Allergen Reactions   Penicillin G Hives    History reviewed. No pertinent family history.  Prior to Admission medications   Medication Sig Start Date End Date Taking? Authorizing Provider  amiodarone (PACERONE) 200 MG tablet Take 200 mg by mouth daily. 05/30/23   [provider]  apixaban (ELIQUIS) 5 MG TABS tablet Take 1 tablet (5 mg total) by mouth 2 (two) times daily. 04/26/23   Ivonne Andrew, NP  atorvastatin (LIPITOR) 40 MG tablet Take 1 tablet (40 mg total) by mouth daily. 04/26/23   Ivonne Andrew, NP  furosemide (LASIX) 20 MG tablet Take 1 tablet (20 mg total) by mouth every Monday, Wednesday, and Friday. 05/26/23 08/24/23  Reather Littler D, NP  metoprolol tartrate (LOPRESSOR) 100 MG tablet Take 1 tablet (100 mg total) by mouth 2 (two) times daily. 04/26/23   Ivonne Andrew, NP  pantoprazole (PROTONIX) 40 MG tablet Take 1 tablet (40 mg total) by mouth 2 (two) times daily. 05/25/23 05/24/24  Armbruster, Willaim Rayas, MD  polyethylene glycol (MIRALAX / GLYCOLAX) 17 g packet Take 17 g by mouth daily as needed for mild constipation. 05/08/23   Rolly Salter, MD  potassium chloride SA (KLOR-CON M) 20 MEQ tablet Take 1 tablet (20 mEq total) by mouth every Monday, Wednesday, and Friday for 14 days. With lasix 05/26/23 06/09/23  Reather Littler D, NP   sucralfate (CARAFATE) 1 GM/10ML suspension Take 10 mLs (1 g total) by mouth every 6 (six) hours. 04/26/23   Ivonne Andrew, NP    Physical Exam: Vitals:   06/05/23 1745 06/05/23 1940 06/05/23 2000 06/05/23 2015  BP: (!) 155/123 116/83 126/83 (!) 129/113  Pulse: 71 84 77 79  Resp: 17 17  (!) 21  Temp: 97.7 F (36.5 C)     TempSrc: Oral     SpO2: 98% 97% 98% 100%   Constitutional: NAD, calm, comfortable, well-appearing elderly female lying in bed with c-collar Eyes:  lids and conjunctivae normal ENMT: Mucous membranes are moist.  Missing dentition Neck: normal, c-collar in place Respiratory: clear to auscultation bilaterally, no wheezing, no crackles. Normal respiratory effort. No accessory muscle use.  On 2 L via nasal cannula Cardiovascular: Regular rate and rhythm, no murmurs / rubs / gallops. No extremity edema.  Abdomen: Soft, nontender nondistended  musculoskeletal: no clubbing / cyanosis. No joint deformity upper and lower extremities. Normal muscle tone.  Skin: Superficial skin tear to the left dorsal hand, ecchymosis throughout the right  dorsal hand Neurologic: CN 2-12 grossly intact.  Strength 5/5 in all 4.  Psychiatric: Normal judgment and insight. Alert and oriented x 3. Normal mood. Data Reviewed:  See HPI  Assessment and Plan: * Pericardial effusion - CT imaging showing large pericardial effusion that is probably increased from chest CT done a few months ago -Last echocardiogram on 05/05/2023 showed trivial pericardial effusion.  Will repeat echocardiogram.  No cardiac symptoms at this time and she is hemodynamically stable.  Dens fracture (HCC) -Minimally displaced type 3 dens fracture. On the left the fracture extends into the left transverse foramen. -No vascular injury on CTA neck -Neurosurgery was consulted and recommended follow-up outpatient in a week -Continue c-collar  Pleural effusion - Patient with history of combined systolic and diastolic heart  failure currently with large left-sided pleural effusion.  However she otherwise appears euvolemic on exam -Continue with home Lasix -Will obtain ultrasound-guided thoracentesis in the morning-she does have large hiatal hernia to the left lung   Persistent atrial fibrillation (HCC) - Continue metoprolol - Patient recently discontinued off amiodarone on 11/8 due to concerns of amiodarone induced thyroid disease -Hold Eliquis for thoracentesis in the morning  History of dysphagia - Continue dysphasic 3 diet as previously recommended by speech therapy   Abnormal TSH - Patient with abnormal TSH on 11/6 thought potentially to be amiodarone induced.  Has discontinued amiodarone since 11/8. -will repeat TSH  Chronic combined systolic (congestive) and diastolic (congestive) heart failure (HCC) -05/05/2023- EF of 50-55% with global hypokinesis. LV diastolic indeterminate. Moderately elevated pulmonary artery systolic pressure. Small pericardial effusion. Trivial MV regurgitation. Mild to moderate TV regurgitation.  -Has large left pleural effusion but otherwise appears euvolemic. Effusion appears isolated rather than due to CHF exacerbation -continue home Lasix  Fall at home, initial encounter - Unclear cause.  Patient reports fall being due to unsteady gait.  However nephew was concerned that she has been feeling dizziness. -Fall could be multifactorial from her chronic hypoxia with questionable adherence to her supplemental oxygen, large new left-sided pleural effusion and enlarging pericardial effusion -PT evaluation  Chronic hypoxic respiratory failure (HCC) - Thought secondary to large hiatal hernia -Currently remains on 2 L via nasal cannula.  Questions patient's compliance at home.  Hypokalemia - Administered oral potassium  Pure hypercholesterolemia - Continue statin  PAD (peripheral artery disease) (HCC) - S/p right SFA stent -Holding Eliquis for thoracentesis      Advance  Care Planning: DNR  Consults: Neurosurgery  Family Communication: Discussed with nephew over the phone  Severity of Illness: The appropriate patient status for this patient is OBSERVATION. Observation status is judged to be reasonable and necessary in order to provide the required intensity of service to ensure the patient's safety. The patient's presenting symptoms, physical exam findings, and initial radiographic and laboratory data in the context of their medical condition is felt to place them at decreased risk for further clinical deterioration. Furthermore, it is anticipated that the patient will be medically stable for discharge from the hospital within 2 midnights of admission.   Author: Anselm Jungling, DO 06/05/2023 8:39 PM  For on call review [email or web address].

## 2023-06-05 NOTE — ED Notes (Signed)
Patient transported to CT 

## 2023-06-05 NOTE — Assessment & Plan Note (Signed)
-  Minimally displaced type 3 dens fracture. On the left the fracture extends into the left transverse foramen. -No vascular injury on CTA neck -Neurosurgery was consulted and recommended follow-up outpatient in a week -Continue c-collar

## 2023-06-05 NOTE — Assessment & Plan Note (Signed)
-   Continue dysphasic 3 diet as previously recommended by speech therapy

## 2023-06-05 NOTE — ED Notes (Signed)
ED TO INPATIENT HANDOFF REPORT  ED Nurse Name and Phone #: Dahlia Client [phone]  S Name/Age/Gender Sarah Wolf 88 y.o. female Room/Bed: 021C/021C  Code Status   Code Status: Prior  Home/SNF/Other Home Patient oriented to: self, place, time, and situation Is this baseline? Yes   Triage Complete: Triage complete  Chief Complaint fall  Triage Note Pt BIB [facility] with reports of fall yesterday. Hit back of head on refrigerator. Denies LOC. On Eliquis. Left side neck pain.    Allergies Allergies  Allergen Reactions   Penicillin G Hives    Level of Care/Admitting Diagnosis ED Disposition     ED Disposition  Discharge   Condition  --   Comment  I, Derwood Kaplan, MD, have physically assessed this patient and they appear reasonably screened and/or stabilized for discharge and I doubt any other medical condition or other [facility] requiring further screening, evaluation, or treatment in the ED exis ts or is present at this time prior to discharge.          B Medical/Surgery History Past Medical History:  Diagnosis Date   Atherosclerosis of native arteries of extremities with rest pain, right leg (HCC)    CHF (congestive heart failure) (HCC)    Diaphragmatic hernia without obstruction or gangrene    GERD (gastroesophageal reflux disease)    History of DVT (deep vein thrombosis)    Hypertension    Peripheral arterial disease (HCC)    Persistent atrial fibrillation (HCC)    Personal history of peptic ulcer disease    Type 2 diabetes mellitus with diabetic peripheral angiopathy without gangrene (HCC)    Unspecified hearing loss, bilateral    Past Surgical History:  Procedure Laterality Date   ABDOMINAL AORTOGRAM W/LOWER EXTREMITY N/A 03/15/2023   Procedure: ABDOMINAL AORTOGRAM W/LOWER EXTREMITY;  Surgeon: Victorino Sparrow, MD;  Location: [facility] INVASIVE CV LAB;  Service: Cardiovascular;  Laterality: N/A;   BIOPSY  03/26/2023   Procedure: BIOPSY;  Surgeon: Benancio Deeds, MD;   Location: [facility] ENDOSCOPY;  Service: Gastroenterology;;   ESOPHAGOGASTRODUODENOSCOPY (EGD) WITH PROPOFOL N/A 03/26/2023   Procedure: ESOPHAGOGASTRODUODENOSCOPY (EGD) WITH PROPOFOL;  Surgeon: Benancio Deeds, MD;  Location: [facility] ENDOSCOPY;  Service: Gastroenterology;  Laterality: N/A;   ORIF FEMUR FRACTURE Right 03/08/2019   Procedure: OPEN REDUCTION INTERNAL FIXATION (ORIF) DISTAL FEMUR FRACTURE;  Surgeon: Kathryne Hitch, MD;  Location: [facility] ORS;  Service: Orthopedics;  Laterality: Right;   PERIPHERAL VASCULAR INTERVENTION Right 03/16/2023   Procedure: PERIPHERAL VASCULAR INTERVENTION;  Surgeon: Cephus Shelling, MD;  Location: [facility] INVASIVE CV LAB;  Service: Cardiovascular;  Laterality: Right;  SFA   PERIPHERAL VASCULAR THROMBECTOMY N/A 03/16/2023   Procedure: LYSIS RECHECK;  Surgeon: Cephus Shelling, MD;  Location: [facility] INVASIVE CV LAB;  Service: Cardiovascular;  Laterality: N/A;     A IV Location/Drains/Wounds Patient Lines/Drains/Airways Status     Active Line/Drains/Airways     Name Placement date Placement time Site Days   Peripheral IV 06/05/23 20 G 2.5" Left Antecubital 06/05/23  1457  Antecubital  less than 1            Intake/Output Last 24 hours No intake or output data in the 24 hours ending 06/05/23 1900  Labs/Imaging Results for orders placed or performed during the hospital encounter of 06/05/23 (from the past 48 hour(s))  Basic metabolic panel     Status: Abnormal   Collection Time: 06/05/23 11:15 AM  Result Value Ref Range   Sodium 135 135 - 145 mmol/L  Potassium 3.0 (L) 3.5 - 5.1 mmol/L   Chloride 95 (L) 98 - 111 mmol/L   CO2 29 22 - 32 mmol/L   Glucose, Bld 117 (H) 70 - 99 mg/dL    Comment: Glucose reference range applies only to samples taken after fasting for at least 8 hours.   BUN 16 8 - 23 mg/dL   Creatinine, Ser 1.61 0.44 - 1.00 mg/dL   Calcium 8.7 (L) 8.9 - 10.3 mg/dL   GFR, Estimated >09 >60 mL/min    Comment: (NOTE) Calculated using the  CKD-EPI Creatinine Equation (2021)    Anion gap 11 5 - 15    Comment: Performed at [facility], [address] [place], [place] [address]  CBC with Differential     Status: Abnormal   Collection Time: 06/05/23 11:15 AM  Result Value Ref Range   WBC 6.9 4.0 - 10.5 K/uL   RBC 3.90 3.87 - 5.11 MIL/uL   Hemoglobin 11.3 (L) 12.0 - 15.0 g/dL   HCT 81.1 91.4 - 78.2 %   MCV 93.6 80.0 - 100.0 fL   MCH 29.0 26.0 - 34.0 pg   MCHC 31.0 30.0 - 36.0 g/dL   RDW 95.6 (H) 21.3 - 08.6 %   Platelets 251 150 - 400 K/uL   nRBC 0.0 0.0 - 0.2 %   Neutrophils Relative % 84 %   Neutro Abs 5.7 1.7 - 7.7 K/uL   Lymphocytes Relative 11 %   Lymphs Abs 0.8 0.7 - 4.0 K/uL   Monocytes Relative 5 %   Monocytes Absolute 0.3 0.1 - 1.0 K/uL   Eosinophils Relative 0 %   Eosinophils Absolute 0.0 0.0 - 0.5 K/uL   Basophils Relative 0 %   Basophils Absolute 0.0 0.0 - 0.1 K/uL   Immature Granulocytes 0 %   Abs Immature Granulocytes 0.02 0.00 - 0.07 K/uL    Comment: Performed at [facility], [address] [place], [place] [address]   CT Angio Neck W and/or Wo Contrast  Result Date: 06/05/2023 CLINICAL DATA:  Cervical spine fracture. Type 3 dens fracture with extension to the transverse foramen on the left. EXAM: CT ANGIOGRAPHY NECK TECHNIQUE: Multidetector CT imaging of the neck was performed using the standard protocol during bolus administration of intravenous contrast. Multiplanar CT image reconstructions and MIPs were obtained to evaluate the vascular anatomy. Carotid stenosis measurements (when applicable) are obtained utilizing NASCET criteria, using the distal internal carotid diameter as the denominator. RADIATION DOSE REDUCTION: This exam was performed according to the departmental dose-optimization program which includes automated exposure control, adjustment of the mA and/or kV according to patient size and/or use of iterative reconstruction technique. CONTRAST:  75mL OMNIPAQUE IOHEXOL 350 MG/ML  SOLN COMPARISON:  Cervical spine CT same day FINDINGS: Aortic arch: Aortic atherosclerosis. Large pericardial effusion is noted, probably increased from the chest CT done 1 month ago. Right carotid system: Common carotid artery widely patent to the bifurcation. Soft and calcified plaque at the carotid bifurcation and ICA bulb. No stenosis greater than 20%. Stenosis in the cervical ICA beyond the bulb shows a minimal diameter of 2.5 mm. Compared to the normal diameter of 5 mm, this indicates a 50% stenosis. Left carotid system: Common carotid artery widely patent to the bifurcation. Calcified plaque at the carotid bifurcation and ICA bulb. No stenosis. Vertebral arteries: Atherosclerotic plaque at both vertebral artery origins. No stenosis on the left. On the right, there is a 50% stenosis. Atherosclerotic disease affecting the proximal right  vertebral artery but without further stenosis greater than 20%. Beyond that the vessels are widely patent through the cervical region. This includes at the C2 level where there is no evidence intimal injury. Skeleton: Type 3 fracture of the dens as shown by the prior cervical spine Other neck: No mass or lymphadenopathy. Upper chest: Large left effusion with volume loss of the left lung. IMPRESSION: 1. No evidence of arterial injury in the neck, with specific attention to the C2 level. 2. 50% stenosis of the cervical ICA beyond the bulb on the right. 3. 50% stenosis of the right vertebral artery origin. 4. Large pericardial effusion, probably increased from the chest CT done 1 month ago. 5. Large left pleural effusion with volume loss of the left lung. 6. Type 3 fracture of the dens as shown by the prior cervical spine CT. Aortic Atherosclerosis (ICD10-I70.0). Electronically Signed   By: Paulina Fusi M.D.   On: 06/05/2023 17:41   CT Head Wo Contrast  Result Date: 06/05/2023 CLINICAL DATA:  Head trauma, moderate-severe fall on eliq; Neck trauma (Age >= 65y) fall w neck pain  EXAM: CT HEAD WITHOUT CONTRAST CT CERVICAL SPINE WITHOUT CONTRAST TECHNIQUE: Multidetector CT imaging of the head and cervical spine was performed following the standard protocol without intravenous contrast. Multiplanar CT image reconstructions of the cervical spine were also generated. RADIATION DOSE REDUCTION: This exam was performed according to the departmental dose-optimization program which includes automated exposure control, adjustment of the mA and/or kV according to patient size and/or use of iterative reconstruction technique. COMPARISON:  None Available. FINDINGS: CT HEAD FINDINGS Brain: No hemorrhage. No hydrocephalus. No extra-axial fluid collection. No mass effect. No mass lesion. No CT evidence of an acute cortical infarct. There is sequela of moderate chronic microvascular ischemic change. Generalized volume loss. Vascular: No hyperdense vessel or unexpected calcification. Skull: Normal. Negative for fracture or focal lesion. Sinuses/Orbits: No middle ear or mastoid effusion. Paranasal sinuses are clear. Bilateral lens replacement. Orbits are otherwise unremarkable. Other: None. CT CERVICAL SPINE FINDINGS Alignment: There is minimally displaced type 3 dens fracture. On the left the fracture extends into the left transverse foramen. Skull base and vertebrae: There is minimally displaced type 3 dens fracture. On the left the fracture extends into the left transverse foramen. Possible additional minimal superior endplate compression deformity at T1. Soft tissues and spinal canal: There is mild prevertebral soft tissue edema. No definite evidence of an epidural hematoma. There is a degenerative pannus at the C1-C2 articulation. Disc levels:  No evidence of high-grade spinal canal stenosis Upper chest: There is a left-sided pleural effusion, unchanged compared to 05/07/2023 Other: None IMPRESSION: 1. No CT evidence of intracranial injury. 2. Minimally displaced type 3 dens fracture. On the left the  fracture extends into the left transverse foramen. Recommend further evaluation with a CTA of the neck to exclude vascular injury. 3. Possible additional minimal superior endplate compression deformity at T1. Electronically Signed   By: Lorenza Cambridge M.D.   On: 06/05/2023 13:39   CT Cervical Spine Wo Contrast  Result Date: 06/05/2023 CLINICAL DATA:  Head trauma, moderate-severe fall on eliq; Neck trauma (Age >= 65y) fall w neck pain EXAM: CT HEAD WITHOUT CONTRAST CT CERVICAL SPINE WITHOUT CONTRAST TECHNIQUE: Multidetector CT imaging of the head and cervical spine was performed following the standard protocol without intravenous contrast. Multiplanar CT image reconstructions of the cervical spine were also generated. RADIATION DOSE REDUCTION: This exam was performed according to the departmental dose-optimization program which includes automated  exposure control, adjustment of the mA and/or kV according to patient size and/or use of iterative reconstruction technique. COMPARISON:  None Available. FINDINGS: CT HEAD FINDINGS Brain: No hemorrhage. No hydrocephalus. No extra-axial fluid collection. No mass effect. No mass lesion. No CT evidence of an acute cortical infarct. There is sequela of moderate chronic microvascular ischemic change. Generalized volume loss. Vascular: No hyperdense vessel or unexpected calcification. Skull: Normal. Negative for fracture or focal lesion. Sinuses/Orbits: No middle ear or mastoid effusion. Paranasal sinuses are clear. Bilateral lens replacement. Orbits are otherwise unremarkable. Other: None. CT CERVICAL SPINE FINDINGS Alignment: There is minimally displaced type 3 dens fracture. On the left the fracture extends into the left transverse foramen. Skull base and vertebrae: There is minimally displaced type 3 dens fracture. On the left the fracture extends into the left transverse foramen. Possible additional minimal superior endplate compression deformity at T1. Soft tissues and  spinal canal: There is mild prevertebral soft tissue edema. No definite evidence of an epidural hematoma. There is a degenerative pannus at the C1-C2 articulation. Disc levels:  No evidence of high-grade spinal canal stenosis Upper chest: There is a left-sided pleural effusion, unchanged compared to 05/07/2023 Other: None IMPRESSION: 1. No CT evidence of intracranial injury. 2. Minimally displaced type 3 dens fracture. On the left the fracture extends into the left transverse foramen. Recommend further evaluation with a CTA of the neck to exclude vascular injury. 3. Possible additional minimal superior endplate compression deformity at T1. Electronically Signed   By: Lorenza Cambridge M.D.   On: 06/05/2023 13:39   DG Chest Portable 1 View  Result Date: 06/05/2023 CLINICAL DATA:  Shortness of breath, fall EXAM: PORTABLE CHEST 1 VIEW COMPARISON:  05/07/2023 FINDINGS: Moderate to large left pleural effusion. Part of the left basilar density may be due to the patient's known large left eccentric hiatal hernia containing most of the stomach and also part of the pancreas that was shown on the CT scan from 05/07/2023. Low lung volumes are present, causing crowding of the pulmonary vasculature. Minimal blunting of the right lateral costophrenic angle suggesting a small right pleural effusion. Mildly indistinct pulmonary vasculature favoring pulmonary venous hypertension. Suspected mild enlargement of the cardiopericardial silhouette. Atheromatous vascular calcification of the thoracic aorta IMPRESSION: 1. Moderate to large left pleural effusion. 2. Small right pleural effusion. 3. Mildly indistinct pulmonary vasculature favoring pulmonary venous hypertension. 4. Suspected mild enlargement of the cardiopericardial silhouette. 5. Large left eccentric hiatal hernia containing most of the stomach and also part of the pancreas shown on prior exams, potentially accounting for some of the density at the left lung base. 6. Low  lung volumes. Electronically Signed   By: Gaylyn Rong M.D.   On: 06/05/2023 13:30    Pending Labs Unresulted Labs (From admission, onward)    None       Vitals/Pain Today's Vitals   06/05/23 1037 06/05/23 1245 06/05/23 1655 06/05/23 1745  BP: (!) 154/109 (!) 140/104 (!) 75/65 (!) 155/123  Pulse: 66 73 65 71  Resp: 16 16 18 17   Temp: 97.6 F (36.4 C) 97.7 F (36.5 C) 97.7 F (36.5 C) 97.7 F (36.5 C)  TempSrc: Oral Oral Oral Oral  SpO2: 92% 100% 97% 98%    Isolation Precautions No active isolations  Medications Medications  potassium chloride SA (KLOR-CON M) CR tablet 40 mEq (40 mEq Oral Given 06/05/23 1250)  iohexol (OMNIPAQUE) 350 MG/ML injection 75 mL (75 mLs Intravenous Contrast Given 06/05/23 1617)    Mobility walks  Focused Assessments     R Recommendations: See Admitting Provider Note  Report given to:   Additional Notes:

## 2023-06-05 NOTE — Assessment & Plan Note (Signed)
-  05/05/2023- EF of 50-55% with global hypokinesis. LV diastolic indeterminate. Moderately elevated pulmonary artery systolic pressure. Small pericardial effusion. Trivial MV regurgitation. Mild to moderate TV regurgitation.  -Has large left pleural effusion but otherwise appears euvolemic. Effusion appears isolated rather than due to CHF exacerbation -continue home Lasix

## 2023-06-05 NOTE — ED Notes (Signed)
Phlebotomy at bedside.

## 2023-06-05 NOTE — Assessment & Plan Note (Signed)
-   Continue metoprolol - Patient recently discontinued off amiodarone on 11/8 due to concerns of amiodarone induced thyroid disease -Hold Eliquis for thoracentesis in the morning

## 2023-06-05 NOTE — ED Notes (Addendum)
Patient placed on 2L [place] by RN, nephew states her baseline is oxygen is 2 liters/[place].

## 2023-06-05 NOTE — Assessment & Plan Note (Signed)
-   CT imaging showing large pericardial effusion that is probably increased from chest CT done a few months ago -Last echocardiogram on 05/05/2023 showed trivial pericardial effusion.  Will repeat echocardiogram.  No cardiac symptoms at this time and she is hemodynamically stable.

## 2023-06-05 NOTE — ED Notes (Signed)
XR at bedside

## 2023-06-05 NOTE — ED Notes (Signed)
Patient repositioned correctly in c-collar

## 2023-06-05 NOTE — ED Notes (Signed)
Pt nephew would like updates [name].

## 2023-06-05 NOTE — ED Notes (Signed)
Patient arrives to room in [facility]

## 2023-06-05 NOTE — Discharge Instructions (Addendum)
The workup in the emergency room reveals that you have a cervical spine fracture and also fluid around the heart.  We wanted you to stay in the hospital, however you have opted to go home.   Please follow-up with the neurosurgeon and the cardiologist.  Call the neurosurgery team for a close follow-up.  Cardiology team will contact you to set up an appointment.  Use Tylenol every 4 hours as needed for pain. Follow-up with your primary doctor for pleural effusion/fluid in your lungs. Wear neck collar until you see the neurosurgeon in 1 week.  No lifting anything heavier than 10 pounds. If you develop weakness, numbness, difficulty with bowel or bladder function return to the emergency room or call the neurosurgeon office.

## 2023-06-05 NOTE — ED Triage Notes (Signed)
Pt BIB [facility] with reports of fall yesterday. Hit back of head on refrigerator. Denies LOC. On Eliquis. Left side neck pain.

## 2023-06-05 NOTE — ED Provider Notes (Addendum)
Physical Exam  BP (!) 140/104 (BP Location: Right Arm)   Pulse 73   Temp 97.7 F (36.5 C) (Oral)   Resp 16   SpO2 100%   Physical Exam  Procedures  .Critical Care  Performed by: Derwood Kaplan, MD Authorized by: Derwood Kaplan, MD   Critical care provider statement:    Critical care time (minutes):  36   Critical care was time spent personally by me on the following activities:  Development of treatment plan with patient or surrogate, discussions with consultants, evaluation of patient's response to treatment, examination of patient, ordering and review of laboratory studies, ordering and review of radiographic studies, ordering and performing treatments and interventions, pulse oximetry, re-evaluation of patient's condition and review of old charts   ED Course / MDM    Medical Decision Making Amount and/or Complexity of Data Reviewed Radiology: ordered.  Risk Prescription drug management.   Assuming care of patient today. Has cervical spine fracture. CT-A ordered. Neurosurgery recommended outpatient f/u.  6:31 PM  Patient CT angiogram is positive for large pericardial effusion.  I have reviewed patient's records including recent admission. Patient was admitted for blood clots, developed acute respiratory failure with hypoxia and was given in the ICU at that point.  She is now on Eliquis.  She was taking anticoagulation even before it appears because of A-fib.  The echocardiogram from October revealed small pericardial effusion.  It appears now that the pericardial effusion is larger on the CAT scan.  I discussed this finding with the patient.  Inform her that the CT angiogram is reassuring from the perspective of the blood vessels in her neck, they do not see any signs of trauma.  However the pericardial effusion is large.  Patient is nephews at the bedside.  Patient indicates to me that she has had some dizziness, she does not know why she fell.  She thinks that she  lost balance.  She did not lose consciousness.  She denies any chest pain, shortness of breath.  Nephew states that patient lives by herself.  He has noticed that patient's balance has worsened and she has become weaker.  I discussed with the patient that in the setting of her having high comorbidity index, her being on blood thinners, her being on oxygen at home, her having echocardiogram that reveals worsening pericardial effusion and having injury that reveals cervical spine fracture, which has high risk of comorbidity if there is worsening complication and more falls -it would be best for her to be admitted.  She needs repeat echocardiogram.  She will need to be assessed for possible pericardiocentesis.  She will need PT-OT to ensure she is safe to go home.  She might even need to go to a rehab facility.  Patient is adamantly against that recommendation.  She indicates that she is comfortable even dying, but she does not want to stay in the hospital.  Patient wants to leave against medical advice. Patient understands that his/her actions will lead to inadequate medical workup, and that he/she is at risk of complications of missed diagnosis, which includes morbidity and mortality.  Alternative options discussed - obs stay. Opportunity to change mind given. Discussion witnessed by patient's nephew Patient is demonstrating good capacity to make decision. Patient understands that she needs to return to the ER immediately if her symptoms get worse.   7:09 PM  Patient has not changed her mind.  She is comfortable with admission. Medicine consulted.  Patient will need echocardiogram.  She  will need PT-OT.    Derwood Kaplan, MD 06/05/23 1910

## 2023-06-05 NOTE — Assessment & Plan Note (Signed)
-   Thought secondary to large hiatal hernia -Currently remains on 2 L via nasal cannula.  Questions patient's compliance at home.

## 2023-06-05 NOTE — Assessment & Plan Note (Signed)
-   Administered oral potassium

## 2023-06-05 NOTE — Assessment & Plan Note (Signed)
-   Patient with abnormal TSH on 11/6 thought potentially to be amiodarone induced.  Has discontinued amiodarone since 11/8. -will repeat TSH

## 2023-06-05 NOTE — ED Provider Triage Note (Signed)
Emergency Medicine Provider Triage Evaluation Note  Sarah Wolf , a 88 y.o. female  was evaluated in triage.  History of atrial relation on Eliquis.  Reports fall backward yesterday.  Struck back of her head on the fridge.  Now with left-sided neck pain.  Uncertain why she fell.  Able to ambulate since fall.  Did not seek medical evaluation yesterday.  No other injuries reported Review of Systems  Positive: Neck pain Negative: Headache, chest pain, shortness of breath, abdominal pain, pain in extremities  Physical Exam  BP (!) 154/109   Pulse 66   Temp 97.6 F (36.4 C) (Oral)   Resp 16   SpO2 92%  Gen:   Awake, no distress Resp:  Normal effort clear to auscultation bilaterally MSK:   Moves extremities without difficulty.  Bruising over dorsal aspect with both hands with skin tears.  No bony tenderness.  Left paraspinal cervical tenderness.   Medical Decision Making  Medically screening exam initiated at 10:43 AM.  Appropriate orders placed.  [name] was informed that the remainder of the evaluation will be completed by another provider, this initial triage assessment does not replace that evaluation, and the importance of remaining in the ED until their evaluation is complete.     Royanne Foots, DO 06/05/23 1049

## 2023-06-05 NOTE — Assessment & Plan Note (Signed)
-   Patient with history of combined systolic and diastolic heart failure currently with large left-sided pleural effusion.  However she otherwise appears euvolemic on exam -Continue with home Lasix -Will obtain ultrasound-guided thoracentesis in the morning-she does have large hiatal hernia to the left lung

## 2023-06-05 NOTE — ED Notes (Signed)
Awaiting patient from EMS triage

## 2023-06-05 NOTE — Assessment & Plan Note (Signed)
-   Unclear cause.  Patient reports fall being due to unsteady gait.  However nephew was concerned that she has been feeling dizziness. -Fall could be multifactorial from her chronic hypoxia with questionable adherence to her supplemental oxygen, large new left-sided pleural effusion and enlarging pericardial effusion -PT evaluation

## 2023-06-05 NOTE — Assessment & Plan Note (Signed)
-   S/p right SFA stent -Holding Eliquis for thoracentesis

## 2023-06-05 NOTE — ED Provider Notes (Signed)
[place] EMERGENCY DEPARTMENT AT [facility] Provider Note   CSN: [id number] Arrival date & time: 06/05/23  1032     History  Chief Complaint  Patient presents with   Fall    Sarah Wolf is a 88 y.o. female.  Patient presents from home after falling backwards yesterday and hitting the back of her head.  Patient said left-sided neck pain since then.  No syncope or seizures.  No vomiting.  Patient is on Eliquis from atrial fibrillation history.  No numbness or weakness in arms or legs.   Fall Associated symptoms include headaches. Pertinent negatives include no chest pain, no abdominal pain and no shortness of breath.       Home Medications Prior to Admission medications   Medication Sig Start Date End Date Taking? Authorizing Provider  amiodarone (PACERONE) 200 MG tablet Take 200 mg by mouth daily. 05/30/23   [provider]  apixaban (ELIQUIS) 5 MG TABS tablet Take 1 tablet (5 mg total) by mouth 2 (two) times daily. 04/26/23   Ivonne Andrew, NP  atorvastatin (LIPITOR) 40 MG tablet Take 1 tablet (40 mg total) by mouth daily. 04/26/23   Ivonne Andrew, NP  furosemide (LASIX) 20 MG tablet Take 1 tablet (20 mg total) by mouth every Monday, Wednesday, and Friday. 05/26/23 08/24/23  Reather Littler D, NP  metoprolol tartrate (LOPRESSOR) 100 MG tablet Take 1 tablet (100 mg total) by mouth 2 (two) times daily. 04/26/23   Ivonne Andrew, NP  pantoprazole (PROTONIX) 40 MG tablet Take 1 tablet (40 mg total) by mouth 2 (two) times daily. 05/25/23 05/24/24  Armbruster, Willaim Rayas, MD  polyethylene glycol (MIRALAX / GLYCOLAX) 17 g packet Take 17 g by mouth daily as needed for mild constipation. 05/08/23   Rolly Salter, MD  potassium chloride SA (KLOR-CON M) 20 MEQ tablet Take 1 tablet (20 mEq total) by mouth every Monday, Wednesday, and Friday for 14 days. With lasix 05/26/23 06/09/23  Reather Littler D, NP  sucralfate (CARAFATE) 1 GM/10ML suspension Take 10 mLs (1 g total) by  mouth every 6 (six) hours. 04/26/23   Ivonne Andrew, NP      Allergies    Penicillin g    Review of Systems   Review of Systems  Constitutional:  Negative for chills and fever.  HENT:  Negative for congestion.   Eyes:  Negative for visual disturbance.  Respiratory:  Negative for shortness of breath.   Cardiovascular:  Negative for chest pain.  Gastrointestinal:  Negative for abdominal pain and vomiting.  Genitourinary:  Negative for dysuria and flank pain.  Musculoskeletal:  Positive for neck pain. Negative for back pain and neck stiffness.  Skin:  Negative for rash.  Neurological:  Positive for headaches. Negative for weakness, light-headedness and numbness.    Physical Exam Updated Vital Signs BP (!) 140/104 (BP Location: Right Arm)   Pulse 73   Temp 97.7 F (36.5 C) (Oral)   Resp 16   SpO2 100%  Physical Exam Vitals and nursing note reviewed.  Constitutional:      General: She is not in acute distress.    Appearance: She is well-developed.  HENT:     Head: Normocephalic.     Comments: Patient has minimal tenderness posterior scalp in the left paraspinal cervical region.  No hematoma the scalp.  C-collar in place.    Mouth/Throat:     Mouth: Mucous membranes are moist.  Eyes:     General:  Right eye: No discharge.        Left eye: No discharge.     Conjunctiva/sclera: Conjunctivae normal.  Neck:     Trachea: No tracheal deviation.  Cardiovascular:     Rate and Rhythm: Normal rate and regular rhythm.  Pulmonary:     Effort: Pulmonary effort is normal.     Comments: Decreased breath sounds lower Abdominal:     General: There is no distension.     Palpations: Abdomen is soft.     Tenderness: There is no abdominal tenderness. There is no guarding.  Musculoskeletal:        General: Tenderness present. No swelling.     Cervical back: Normal range of motion and neck supple. No rigidity.     Comments: Patient has normal strength in all extremities with  flexion extension, decreased range of motion in the right shoulder and that is chronic as she said it dislocates if she flexes at sometimes.  Skin:    General: Skin is warm.     Capillary Refill: Capillary refill takes less than 2 seconds.     Findings: No rash.  Neurological:     General: No focal deficit present.     Mental Status: She is alert.     Cranial Nerves: No cranial nerve deficit.  Psychiatric:        Mood and Affect: Mood normal.     ED Results / Procedures / Treatments   Labs (all labs ordered are listed, but only abnormal results are displayed) Labs Reviewed  BASIC METABOLIC PANEL - Abnormal; Notable for the following components:      Result Value   Potassium 3.0 (*)    Chloride 95 (*)    Glucose, Bld 117 (*)    Calcium 8.7 (*)    All other components within normal limits  CBC WITH DIFFERENTIAL/PLATELET - Abnormal; Notable for the following components:   Hemoglobin 11.3 (*)    RDW 17.2 (*)    All other components within normal limits    EKG None  Radiology CT Head Wo Contrast  Result Date: 06/05/2023 CLINICAL DATA:  Head trauma, moderate-severe fall on eliq; Neck trauma (Age >= 65y) fall w neck pain EXAM: CT HEAD WITHOUT CONTRAST CT CERVICAL SPINE WITHOUT CONTRAST TECHNIQUE: Multidetector CT imaging of the head and cervical spine was performed following the standard protocol without intravenous contrast. Multiplanar CT image reconstructions of the cervical spine were also generated. RADIATION DOSE REDUCTION: This exam was performed according to the departmental dose-optimization program which includes automated exposure control, adjustment of the mA and/or kV according to patient size and/or use of iterative reconstruction technique. COMPARISON:  None Available. FINDINGS: CT HEAD FINDINGS Brain: No hemorrhage. No hydrocephalus. No extra-axial fluid collection. No mass effect. No mass lesion. No CT evidence of an acute cortical infarct. There is sequela of moderate  chronic microvascular ischemic change. Generalized volume loss. Vascular: No hyperdense vessel or unexpected calcification. Skull: Normal. Negative for fracture or focal lesion. Sinuses/Orbits: No middle ear or mastoid effusion. Paranasal sinuses are clear. Bilateral lens replacement. Orbits are otherwise unremarkable. Other: None. CT CERVICAL SPINE FINDINGS Alignment: There is minimally displaced type 3 dens fracture. On the left the fracture extends into the left transverse foramen. Skull base and vertebrae: There is minimally displaced type 3 dens fracture. On the left the fracture extends into the left transverse foramen. Possible additional minimal superior endplate compression deformity at T1. Soft tissues and spinal canal: There is mild prevertebral soft tissue edema.  No definite evidence of an epidural hematoma. There is a degenerative pannus at the C1-C2 articulation. Disc levels:  No evidence of high-grade spinal canal stenosis Upper chest: There is a left-sided pleural effusion, unchanged compared to 05/07/2023 Other: None IMPRESSION: 1. No CT evidence of intracranial injury. 2. Minimally displaced type 3 dens fracture. On the left the fracture extends into the left transverse foramen. Recommend further evaluation with a CTA of the neck to exclude vascular injury. 3. Possible additional minimal superior endplate compression deformity at T1. Electronically Signed   By: Lorenza Cambridge M.D.   On: 06/05/2023 13:39   CT Cervical Spine Wo Contrast  Result Date: 06/05/2023 CLINICAL DATA:  Head trauma, moderate-severe fall on eliq; Neck trauma (Age >= 65y) fall w neck pain EXAM: CT HEAD WITHOUT CONTRAST CT CERVICAL SPINE WITHOUT CONTRAST TECHNIQUE: Multidetector CT imaging of the head and cervical spine was performed following the standard protocol without intravenous contrast. Multiplanar CT image reconstructions of the cervical spine were also generated. RADIATION DOSE REDUCTION: This exam was performed  according to the departmental dose-optimization program which includes automated exposure control, adjustment of the mA and/or kV according to patient size and/or use of iterative reconstruction technique. COMPARISON:  None Available. FINDINGS: CT HEAD FINDINGS Brain: No hemorrhage. No hydrocephalus. No extra-axial fluid collection. No mass effect. No mass lesion. No CT evidence of an acute cortical infarct. There is sequela of moderate chronic microvascular ischemic change. Generalized volume loss. Vascular: No hyperdense vessel or unexpected calcification. Skull: Normal. Negative for fracture or focal lesion. Sinuses/Orbits: No middle ear or mastoid effusion. Paranasal sinuses are clear. Bilateral lens replacement. Orbits are otherwise unremarkable. Other: None. CT CERVICAL SPINE FINDINGS Alignment: There is minimally displaced type 3 dens fracture. On the left the fracture extends into the left transverse foramen. Skull base and vertebrae: There is minimally displaced type 3 dens fracture. On the left the fracture extends into the left transverse foramen. Possible additional minimal superior endplate compression deformity at T1. Soft tissues and spinal canal: There is mild prevertebral soft tissue edema. No definite evidence of an epidural hematoma. There is a degenerative pannus at the C1-C2 articulation. Disc levels:  No evidence of high-grade spinal canal stenosis Upper chest: There is a left-sided pleural effusion, unchanged compared to 05/07/2023 Other: None IMPRESSION: 1. No CT evidence of intracranial injury. 2. Minimally displaced type 3 dens fracture. On the left the fracture extends into the left transverse foramen. Recommend further evaluation with a CTA of the neck to exclude vascular injury. 3. Possible additional minimal superior endplate compression deformity at T1. Electronically Signed   By: Lorenza Cambridge M.D.   On: 06/05/2023 13:39   DG Chest Portable 1 View  Result Date:  06/05/2023 CLINICAL DATA:  Shortness of breath, fall EXAM: PORTABLE CHEST 1 VIEW COMPARISON:  05/07/2023 FINDINGS: Moderate to large left pleural effusion. Part of the left basilar density may be due to the patient's known large left eccentric hiatal hernia containing most of the stomach and also part of the pancreas that was shown on the CT scan from 05/07/2023. Low lung volumes are present, causing crowding of the pulmonary vasculature. Minimal blunting of the right lateral costophrenic angle suggesting a small right pleural effusion. Mildly indistinct pulmonary vasculature favoring pulmonary venous hypertension. Suspected mild enlargement of the cardiopericardial silhouette. Atheromatous vascular calcification of the thoracic aorta IMPRESSION: 1. Moderate to large left pleural effusion. 2. Small right pleural effusion. 3. Mildly indistinct pulmonary vasculature favoring pulmonary venous hypertension. 4. Suspected mild  enlargement of the cardiopericardial silhouette. 5. Large left eccentric hiatal hernia containing most of the stomach and also part of the pancreas shown on prior exams, potentially accounting for some of the density at the left lung base. 6. Low lung volumes. Electronically Signed   By: Gaylyn Rong M.D.   On: 06/05/2023 13:30    Procedures Procedures    Medications Ordered in ED Medications  potassium chloride SA (KLOR-CON M) CR tablet 40 mEq (40 mEq Oral Given 06/05/23 1250)    ED Course/ Medical Decision Making/ A&P                                 Medical Decision Making Amount and/or Complexity of Data Reviewed Radiology: ordered.  Risk Prescription drug management.   Patient presents after mechanical fall yesterday and on anticoagulation making her high risk.  Plan for CT head and neck to look for any fracture.  Low concern for intracranial hemorrhage however anticoagulation does make her higher risk.  Patient denies any abdominal or chest or lower back pain from  the fall.  Patient on baseline 2 L nasal cannula.  Chest x-ray independently reviewed showing pleural effusions, patient has normal work of breathing.  Stable for outpatient follow-up with primary doctor.  Radiologist called with concerns for C2 fracture, reviewed with neurosurgeon on-call Dr Wynetta Emery who recommended collar and follow-up in 1 week.  CT angiogram pending and patient will be discharged assuming no acute findings on this.  Updated patient and family.    Blood work ordered independently reviewed overall reassuring mild hypokalemia 3.0, oral potassium ordered, minimal anemia at baseline 11.        Final Clinical Impression(s) / ED Diagnoses Final diagnoses:  Acute head injury, initial encounter  Cervical strain, acute, initial encounter  Hypokalemia  Pleural effusion  Closed displaced fracture of second cervical vertebra, unspecified fracture morphology, initial encounter [facility])    Rx / DC Orders ED Discharge Orders     None         Blane Ohara, MD 06/05/23 1533

## 2023-06-06 ENCOUNTER — Observation Stay (HOSPITAL_COMMUNITY): Payer: Medicare Other

## 2023-06-06 ENCOUNTER — Observation Stay (HOSPITAL_BASED_OUTPATIENT_CLINIC_OR_DEPARTMENT_OTHER): Payer: Medicare Other

## 2023-06-06 ENCOUNTER — Encounter: Payer: Medicare Other | Admitting: Thoracic Surgery (Cardiothoracic Vascular Surgery)

## 2023-06-06 DIAGNOSIS — Y92009 Unspecified place in unspecified non-institutional (private) residence as the place of occurrence of the external cause: Secondary | ICD-10-CM

## 2023-06-06 DIAGNOSIS — S12100D Unspecified displaced fracture of second cervical vertebra, subsequent encounter for fracture with routine healing: Secondary | ICD-10-CM | POA: Diagnosis not present

## 2023-06-06 DIAGNOSIS — I3139 Other pericardial effusion (noninflammatory): Secondary | ICD-10-CM

## 2023-06-06 DIAGNOSIS — E876 Hypokalemia: Secondary | ICD-10-CM

## 2023-06-06 DIAGNOSIS — Z87898 Personal history of other specified conditions: Secondary | ICD-10-CM

## 2023-06-06 DIAGNOSIS — I4819 Other persistent atrial fibrillation: Secondary | ICD-10-CM

## 2023-06-06 DIAGNOSIS — J9611 Chronic respiratory failure with hypoxia: Secondary | ICD-10-CM | POA: Diagnosis not present

## 2023-06-06 DIAGNOSIS — W19XXXA Unspecified fall, initial encounter: Secondary | ICD-10-CM

## 2023-06-06 DIAGNOSIS — I5042 Chronic combined systolic (congestive) and diastolic (congestive) heart failure: Secondary | ICD-10-CM

## 2023-06-06 DIAGNOSIS — J90 Pleural effusion, not elsewhere classified: Secondary | ICD-10-CM | POA: Diagnosis not present

## 2023-06-06 DIAGNOSIS — R7989 Other specified abnormal findings of blood chemistry: Secondary | ICD-10-CM

## 2023-06-06 LAB — CBC
HCT: 34 % — ABNORMAL LOW (ref 36.0–46.0)
Hemoglobin: 10.8 g/dL — ABNORMAL LOW (ref 12.0–15.0)
MCH: 29 pg (ref 26.0–34.0)
MCHC: 31.8 g/dL (ref 30.0–36.0)
MCV: 91.4 fL (ref 80.0–100.0)
Platelets: 265 10*3/uL (ref 150–400)
RBC: 3.72 MIL/uL — ABNORMAL LOW (ref 3.87–5.11)
RDW: 17.4 % — ABNORMAL HIGH (ref 11.5–15.5)
WBC: 8.9 10*3/uL (ref 4.0–10.5)
nRBC: 0 % (ref 0.0–0.2)

## 2023-06-06 LAB — BASIC METABOLIC PANEL
Anion gap: 12 (ref 5–15)
BUN: 16 mg/dL (ref 8–23)
CO2: 28 mmol/L (ref 22–32)
Calcium: 9.2 mg/dL (ref 8.9–10.3)
Chloride: 95 mmol/L — ABNORMAL LOW (ref 98–111)
Creatinine, Ser: 1.14 mg/dL — ABNORMAL HIGH (ref 0.44–1.00)
GFR, Estimated: 47 mL/min — ABNORMAL LOW (ref >60)
Glucose, Bld: 136 mg/dL — ABNORMAL HIGH (ref 70–99)
Potassium: 3.4 mmol/L — ABNORMAL LOW (ref 3.5–5.1)
Sodium: 135 mmol/L (ref 135–145)

## 2023-06-06 LAB — T4, FREE: Free T4: 0.38 ng/dL — ABNORMAL LOW (ref 0.61–1.12)

## 2023-06-06 LAB — ECHOCARDIOGRAM LIMITED: S' Lateral: 2.4 cm

## 2023-06-06 LAB — TSH: TSH: 55.641 u[IU]/mL — ABNORMAL HIGH (ref 0.350–4.500)

## 2023-06-06 MED ORDER — APIXABAN 5 MG PO TABS
5.0000 mg | ORAL_TABLET | Freq: Two times a day (BID) | ORAL | Status: DC
  Administered 2023-06-06 – 2023-06-09 (×6): 5 mg via ORAL
  Filled 2023-06-06 (×6): qty 1

## 2023-06-06 MED ORDER — PROCHLORPERAZINE EDISYLATE 10 MG/2ML IJ SOLN
5.0000 mg | INTRAMUSCULAR | Status: AC
  Administered 2023-06-06: 5 mg via INTRAVENOUS
  Filled 2023-06-06: qty 2

## 2023-06-06 MED ORDER — LIDOCAINE HCL 1 % IJ SOLN
INTRAMUSCULAR | Status: AC
  Filled 2023-06-06: qty 20

## 2023-06-06 MED ORDER — POTASSIUM CHLORIDE CRYS ER 20 MEQ PO TBCR
40.0000 meq | EXTENDED_RELEASE_TABLET | Freq: Once | ORAL | Status: AC
  Administered 2023-06-06: 40 meq via ORAL
  Filled 2023-06-06: qty 2

## 2023-06-06 MED ORDER — HALOPERIDOL LACTATE 5 MG/ML IJ SOLN
1.0000 mg | Freq: Four times a day (QID) | INTRAMUSCULAR | Status: DC | PRN
  Administered 2023-06-06 (×2): 1 mg via INTRAVENOUS
  Filled 2023-06-06 (×2): qty 1

## 2023-06-06 NOTE — Evaluation (Signed)
Physical Therapy Evaluation Patient Details Name: Sarah Wolf MRN: [id number] DOB: 07-14-1935 Today's Date: 06/06/2023  History of Present Illness  Pt is 88 yo female who lives alone and presents on 06/05/23 after being found down at home by her nephew with O2 off. Fell and hit head on refrigerator. Pt with type 3 dens fx with likely concussion. Pt also with pericardial effusion.  PMH:  CHF (EF 50-55% on 04/2023), HTN, persistent A.fib on Eliquis, hx of DVT (2020), HTN, PAD s/p Rt SFA stent, chronic hypoxic respiratory failure due to hiatal hernia  Clinical Impression  Pt admitted with above diagnosis. Pt from home alone per chart. On eval pt cannot give accurate history or PLOF. Attempted bed mobility with pt but though pt moves all 4 extremities she resists guided mvmt and shows great fear and gravitational insecurity. Pt keeps stating that she wants to go to bed and to just let her lie down, all the while pt is supine in bed. Pt unable to be redirected from this. Cervical collar repositioned for comfort and pt positioned in neutral position in bed. Patient will benefit from continued inpatient follow up therapy, <3 hours/day.  Pt currently with functional limitations due to the deficits listed below (see PT Problem List). Pt will benefit from acute skilled PT to increase their independence and safety with mobility to allow discharge.           If plan is discharge home, recommend the following: Two people to help with walking and/or transfers;Two people to help with bathing/dressing/bathroom;Assistance with feeding;Assistance with cooking/housework;Direct supervision/assist for medications management;Direct supervision/assist for financial management;Assist for transportation;Help with stairs or ramp for entrance;Supervision due to cognitive status   Can travel by private vehicle   No    Equipment Recommendations None recommended by PT  Recommendations for Other Services  OT consult     Functional Status Assessment Patient has had a recent decline in their functional status and demonstrates the ability to make significant improvements in function in a reasonable and predictable amount of time.     Precautions / Restrictions Precautions Precautions: Fall Precaution Comments: pt had fallen and hit head on refrigerator Required Braces or Orthoses: Cervical Brace Cervical Brace: Hard collar;At all times Restrictions Weight Bearing Restrictions: No      Mobility  Bed Mobility Overal bed mobility: Needs Assistance Bed Mobility: Supine to Sit     Supine to sit: Max assist     General bed mobility comments: attempted rolling and coming to EOB but pt started yelling out that she wanted to go to bed. Pt body language and tone relayed fear and pt gripping rails like she's afraid of falling even though laying flat. When covers were removed pt began scratching RLE and digging nails in saying she was trying to get into her pocket so covers replaced.    Transfers                   General transfer comment: unable due to cognition    Ambulation/Gait               General Gait Details: unable to assess  Stairs            Wheelchair Mobility     Tilt Bed    Modified Rankin (Stroke Patients Only)       Balance Overall balance assessment: History of Falls, Needs assistance Sitting-balance support:  (gravitationally insecure even in supine)  Pertinent Vitals/Pain Pain Assessment Pain Assessment: No/denies pain    Home Living Family/patient expects to be discharged to:: Private residence Living Arrangements: Alone Available Help at Discharge: Family;Available PRN/intermittently Type of Home: House Home Access: Ramped entrance       Home Layout: One level Home Equipment: Cane - single Librarian, academic (2 wheels) Additional Comments: home info from last admission, pt not a  reliable historian on eval    Prior Function Prior Level of Function : Independent/Modified Independent;Driving             Mobility Comments: Using RW at home but in chart there is a note that nephew noted RW across the room from pt       Extremity/Trunk Assessment   Upper Extremity Assessment Upper Extremity Assessment: Difficult to assess due to impaired cognition    Lower Extremity Assessment Lower Extremity Assessment: Difficult to assess due to impaired cognition;RLE deficits/detail RLE Deficits / Details: attmpted to assess UE and LE strength but though pt moving all 4 extremities on her own when attempting to cue pt she resists all mvmt. Has strong grip on rail but then does not seem to be able to grip my hand tightly. Limited functional strength due to cognition    Cervical / Trunk Assessment Cervical / Trunk Assessment: Other exceptions Cervical / Trunk Exceptions: pt with dens fx, C collar on, repositioned for comfort of pt  Communication   Communication Communication: No apparent difficulties Cueing Techniques: Verbal cues;Gestural cues;Tactile cues;Visual cues  Cognition Arousal: Alert Behavior During Therapy: Restless, Agitated Overall Cognitive Status: Impaired/Different from baseline Area of Impairment: Orientation, Attention, Memory, Following commands, Safety/judgement, Awareness, Problem solving                 Orientation Level: Disoriented to, Place, Time, Situation Current Attention Level: Focused Memory: Decreased recall of precautions, Decreased short-term memory Following Commands: Follows one step commands inconsistently Safety/Judgement: Decreased awareness of safety, Decreased awareness of deficits Awareness: Intellectual Problem Solving: Decreased initiation, Difficulty sequencing, Requires verbal cues, Requires tactile cues General Comments: pt sometimes able to converse and talk about PLOF but then intermittently says that therapist  stole her walker and that she just wants to get in the bed and lie down because she is so tired. Pt could not be redirected to see that she is currently in the bed.        General Comments General comments (skin integrity, edema, etc.): HR in low 100's, SPO2 in 90's on 2L O2 via [place].    Exercises     Assessment/Plan    PT Assessment Patient needs continued PT services  PT Problem List Decreased cognition;Decreased strength;Decreased mobility;Decreased balance;Decreased knowledge of use of DME;Decreased safety awareness;Decreased knowledge of precautions;Cardiopulmonary status limiting activity       PT Treatment Interventions DME instruction;Gait training;Functional mobility training;Therapeutic activities;Therapeutic exercise;Balance training;Neuromuscular re-education;Cognitive remediation;Patient/family education    PT Goals (Current goals can be found in the Care Plan section)  Acute Rehab PT Goals Patient Stated Goal: go to bed PT Goal Formulation: Patient unable to participate in goal setting Time For Goal Achievement: 06/20/23 Potential to Achieve Goals: Fair    Frequency Min 1X/week     Co-evaluation               AM-PAC PT "6 Clicks" Mobility  Outcome Measure Help needed turning from your back to your side while in a flat bed without using bedrails?: Total Help needed moving from lying on your back to sitting on the side  of a flat bed without using bedrails?: Total Help needed moving to and from a bed to a chair (including a wheelchair)?: Total Help needed standing up from a chair using your arms (e.g., wheelchair or bedside chair)?: Total Help needed to walk in hospital room?: Total Help needed climbing 3-5 steps with a railing? : Total 6 Click Score: 6    End of Session Equipment Utilized During Treatment: Oxygen Activity Tolerance: Treatment limited secondary to agitation Patient left: in bed;with call bell/phone within reach;Other (comment) (Tech present  from ECHO) Nurse Communication: Mobility status PT Visit Diagnosis: Muscle weakness (generalized) (M62.81);Difficulty in walking, not elsewhere classified (R26.2)    Time: 6045-4098 PT Time Calculation (min) (ACUTE ONLY): 13 min   Charges:   PT Evaluation $PT Eval Moderate Complexity: 1 Mod   PT General Charges $$ ACUTE PT VISIT: 1 Visit         Lyanne Co, PT  Acute Rehab Services Secure chat preferred Office [phone]   Lawana Chambers Tarren Velardi 06/06/2023, 2:45 PM

## 2023-06-06 NOTE — ED Notes (Signed)
This RN and NT to position pt in bed for comfort.

## 2023-06-06 NOTE — Progress Notes (Signed)
Patient presents for  therapeutic and diagnostic left side thoracentesis. [place] limited chest shows trace amount of pleural fluid noted  Insufficient to perform a safe thoracentesis. Procedure not performed.

## 2023-06-06 NOTE — ED Notes (Signed)
ED TO INPATIENT HANDOFF REPORT  ED Nurse Name and Phone #: Alieu Finnigan [phone]  S Name/Age/Gender Sarah Wolf 88 y.o. female Room/Bed: 043C/043C  Code Status   Code Status: Limited: Do not attempt resuscitation (DNR) -DNR-LIMITED -Do Not Intubate/DNI   Home/SNF/Other Home Patient oriented to: self, place, and time Is this baseline? Yes   Triage Complete: Triage complete  Chief Complaint Pericardial effusion [I31.39]  Triage Note Pt BIB [facility] with reports of fall yesterday. Hit back of head on refrigerator. Denies LOC. On Eliquis. Left side neck pain.    Allergies Allergies  Allergen Reactions   Penicillin G Hives    Level of Care/Admitting Diagnosis ED Disposition     ED Disposition  Admit   Condition  --   Comment  Hospital Area: [facility] [[id number]]  Level of Care: Telemetry Medical [104]  May place patient in observation at [facility] or [facility] if equivalent level of care is available:: No  Covid Evaluation: Asymptomatic - no recent exposure (last 10 days) testing not required  Diagnosis: Pericardial effusion [203045]  Admitting Physician: Anselm Jungling [[id number]]  Attending Physician: Anselm Jungling [[id number]]          B Medical/Surgery History Past Medical History:  Diagnosis Date   Atherosclerosis of native arteries of extremities with rest pain, right leg (HCC)    CHF (congestive heart failure) (HCC)    Diaphragmatic hernia without obstruction or gangrene    GERD (gastroesophageal reflux disease)    History of DVT (deep vein thrombosis)    Hypertension    Peripheral arterial disease (HCC)    Persistent atrial fibrillation (HCC)    Personal history of peptic ulcer disease    Type 2 diabetes mellitus with diabetic peripheral angiopathy without gangrene (HCC)    Unspecified hearing loss, bilateral    Past Surgical History:  Procedure Laterality Date   ABDOMINAL AORTOGRAM W/LOWER EXTREMITY N/A 03/15/2023   Procedure: ABDOMINAL AORTOGRAM  W/LOWER EXTREMITY;  Surgeon: Victorino Sparrow, MD;  Location: [facility] INVASIVE CV LAB;  Service: Cardiovascular;  Laterality: N/A;   BIOPSY  03/26/2023   Procedure: BIOPSY;  Surgeon: Benancio Deeds, MD;  Location: [facility] ENDOSCOPY;  Service: Gastroenterology;;   ESOPHAGOGASTRODUODENOSCOPY (EGD) WITH PROPOFOL N/A 03/26/2023   Procedure: ESOPHAGOGASTRODUODENOSCOPY (EGD) WITH PROPOFOL;  Surgeon: Benancio Deeds, MD;  Location: [facility] ENDOSCOPY;  Service: Gastroenterology;  Laterality: N/A;   ORIF FEMUR FRACTURE Right 03/08/2019   Procedure: OPEN REDUCTION INTERNAL FIXATION (ORIF) DISTAL FEMUR FRACTURE;  Surgeon: Kathryne Hitch, MD;  Location: [facility] ORS;  Service: Orthopedics;  Laterality: Right;   PERIPHERAL VASCULAR INTERVENTION Right 03/16/2023   Procedure: PERIPHERAL VASCULAR INTERVENTION;  Surgeon: Cephus Shelling, MD;  Location: [facility] INVASIVE CV LAB;  Service: Cardiovascular;  Laterality: Right;  SFA   PERIPHERAL VASCULAR THROMBECTOMY N/A 03/16/2023   Procedure: LYSIS RECHECK;  Surgeon: Cephus Shelling, MD;  Location: [facility] INVASIVE CV LAB;  Service: Cardiovascular;  Laterality: N/A;     A IV Location/Drains/Wounds Patient Lines/Drains/Airways Status     Active Line/Drains/Airways     Name Placement date Placement time Site Days   Peripheral IV 06/05/23 20 G 2.5" Left Antecubital 06/05/23  1457  Antecubital  1   Wound / Incision (Open or Dehisced) 06/06/23 Skin tear Hand Left;Posterior 06/06/23  0000  Hand  less than 1   Wound / Incision (Open or Dehisced) 06/06/23 Skin tear Hand Posterior;Right 06/06/23  0001  Hand  less than 1  Intake/Output Last 24 hours No intake or output data in the 24 hours ending 06/06/23 1148  Labs/Imaging Results for orders placed or performed during the hospital encounter of 06/05/23 (from the past 48 hour(s))  Basic metabolic panel     Status: Abnormal   Collection Time: 06/05/23 11:15 AM  Result Value Ref Range   Sodium 135 135 - 145  mmol/L   Potassium 3.0 (L) 3.5 - 5.1 mmol/L   Chloride 95 (L) 98 - 111 mmol/L   CO2 29 22 - 32 mmol/L   Glucose, Bld 117 (H) 70 - 99 mg/dL    Comment: Glucose reference range applies only to samples taken after fasting for at least 8 hours.   BUN 16 8 - 23 mg/dL   Creatinine, Ser 0.34 0.44 - 1.00 mg/dL   Calcium 8.7 (L) 8.9 - 10.3 mg/dL   GFR, Estimated >74 >25 mL/min    Comment: (NOTE) Calculated using the CKD-EPI Creatinine Equation (2021)    Anion gap 11 5 - 15    Comment: Performed at [facility], [address] [place], [place] [address]  CBC with Differential     Status: Abnormal   Collection Time: 06/05/23 11:15 AM  Result Value Ref Range   WBC 6.9 4.0 - 10.5 K/uL   RBC 3.90 3.87 - 5.11 MIL/uL   Hemoglobin 11.3 (L) 12.0 - 15.0 g/dL   HCT 75.6 43.3 - 29.5 %   MCV 93.6 80.0 - 100.0 fL   MCH 29.0 26.0 - 34.0 pg   MCHC 31.0 30.0 - 36.0 g/dL   RDW 18.8 (H) 41.6 - 60.6 %   Platelets 251 150 - 400 K/uL   nRBC 0.0 0.0 - 0.2 %   Neutrophils Relative % 84 %   Neutro Abs 5.7 1.7 - 7.7 K/uL   Lymphocytes Relative 11 %   Lymphs Abs 0.8 0.7 - 4.0 K/uL   Monocytes Relative 5 %   Monocytes Absolute 0.3 0.1 - 1.0 K/uL   Eosinophils Relative 0 %   Eosinophils Absolute 0.0 0.0 - 0.5 K/uL   Basophils Relative 0 %   Basophils Absolute 0.0 0.0 - 0.1 K/uL   Immature Granulocytes 0 %   Abs Immature Granulocytes 0.02 0.00 - 0.07 K/uL    Comment: Performed at [facility], [address] [place], [place] [address]  TSH     Status: Abnormal   Collection Time: 06/06/23 12:01 AM  Result Value Ref Range   TSH 55.641 (H) 0.350 - 4.500 uIU/mL    Comment: Performed by a 3rd Generation assay with a functional sensitivity of <=0.01 uIU/mL. Performed at [facility], [address], [place], [place] [address]   CBC     Status: Abnormal   Collection Time: 06/06/23  5:08 AM  Result Value Ref Range   WBC 8.9 4.0 - 10.5 K/uL   RBC 3.72 (L) 3.87 - 5.11 MIL/uL   Hemoglobin 10.8  (L) 12.0 - 15.0 g/dL   HCT 35.5 (L) 73.2 - 20.2 %   MCV 91.4 80.0 - 100.0 fL   MCH 29.0 26.0 - 34.0 pg   MCHC 31.8 30.0 - 36.0 g/dL   RDW 54.2 (H) 70.6 - 23.7 %   Platelets 265 150 - 400 K/uL   nRBC 0.0 0.0 - 0.2 %    Comment: Performed at [facility], [address] [place], [place] [address]  Basic metabolic panel     Status: Abnormal  Collection Time: 06/06/23  5:08 AM  Result Value Ref Range   Sodium 135 135 - 145 mmol/L   Potassium 3.4 (L) 3.5 - 5.1 mmol/L   Chloride 95 (L) 98 - 111 mmol/L   CO2 28 22 - 32 mmol/L   Glucose, Bld 136 (H) 70 - 99 mg/dL    Comment: Glucose reference range applies only to samples taken after fasting for at least 8 hours.   BUN 16 8 - 23 mg/dL   Creatinine, Ser 1.61 (H) 0.44 - 1.00 mg/dL   Calcium 9.2 8.9 - 09.6 mg/dL   GFR, Estimated 47 (L) >60 mL/min    Comment: (NOTE) Calculated using the CKD-EPI Creatinine Equation (2021)    Anion gap 12 5 - 15    Comment: Performed at [facility], [address] [place], [place] [address]  T4, free     Status: Abnormal   Collection Time: 06/06/23  5:08 AM  Result Value Ref Range   Free T4 0.38 (L) 0.61 - 1.12 ng/dL    Comment: (NOTE) Biotin ingestion may interfere with free T4 tests. If the results are inconsistent with the TSH level, previous test results, or the clinical presentation, then consider biotin interference. If needed, order repeat testing after stopping biotin. Performed at [facility], [address], [place], [place] [address]    IR US CHEST  Result Date: 06/06/2023 INDICATION: 88 year old female. Incidental finding of left-sided pleural effusion on chest x-ray. Team is requesting a therapeutic and diagnostic left-sided thoracentesis. EXAM: Limited LEFT CHEST ULTRASOUND COMPARISON:  Chest XR, 06/05/2023.  CT chest, 05/07/2023. FINDINGS: Focused ultrasound along the LEFT chest. Images demonstrating trace LEFT pleural effusion. No significant pocket of fluid or  percutaneous window to allow safe thoracentesis. Risks outweigh the benefits. IMPRESSION: Trace LEFT pleural effusion without a safe window for percutaneous access Thoracentesis was NOT performed. Performed by Anders Grant NP Electronically Signed   By: Roanna Banning M.D.   On: 06/06/2023 11:03   CT Angio Neck W and/or Wo Contrast  Result Date: 06/05/2023 CLINICAL DATA:  Cervical spine fracture. Type 3 dens fracture with extension to the transverse foramen on the left. EXAM: CT ANGIOGRAPHY NECK TECHNIQUE: Multidetector CT imaging of the neck was performed using the standard protocol during bolus administration of intravenous contrast. Multiplanar CT image reconstructions and MIPs were obtained to evaluate the vascular anatomy. Carotid stenosis measurements (when applicable) are obtained utilizing NASCET criteria, using the distal internal carotid diameter as the denominator. RADIATION DOSE REDUCTION: This exam was performed according to the departmental dose-optimization program which includes automated exposure control, adjustment of the mA and/or kV according to patient size and/or use of iterative reconstruction technique. CONTRAST:  75mL OMNIPAQUE IOHEXOL 350 MG/ML SOLN COMPARISON:  Cervical spine CT same day FINDINGS: Aortic arch: Aortic atherosclerosis. Large pericardial effusion is noted, probably increased from the chest CT done 1 month ago. Right carotid system: Common carotid artery widely patent to the bifurcation. Soft and calcified plaque at the carotid bifurcation and ICA bulb. No stenosis greater than 20%. Stenosis in the cervical ICA beyond the bulb shows a minimal diameter of 2.5 mm. Compared to the normal diameter of 5 mm, this indicates a 50% stenosis. Left carotid system: Common carotid artery widely patent to the bifurcation. Calcified plaque at the carotid bifurcation and ICA bulb. No stenosis. Vertebral arteries: Atherosclerotic plaque at both vertebral artery origins. No stenosis on  the left. On the right, there is a 50% stenosis. Atherosclerotic disease  affecting the proximal right vertebral artery but without further stenosis greater than 20%. Beyond that the vessels are widely patent through the cervical region. This includes at the C2 level where there is no evidence intimal injury. Skeleton: Type 3 fracture of the dens as shown by the prior cervical spine Other neck: No mass or lymphadenopathy. Upper chest: Large left effusion with volume loss of the left lung. IMPRESSION: 1. No evidence of arterial injury in the neck, with specific attention to the C2 level. 2. 50% stenosis of the cervical ICA beyond the bulb on the right. 3. 50% stenosis of the right vertebral artery origin. 4. Large pericardial effusion, probably increased from the chest CT done 1 month ago. 5. Large left pleural effusion with volume loss of the left lung. 6. Type 3 fracture of the dens as shown by the prior cervical spine CT. Aortic Atherosclerosis (ICD10-I70.0). Electronically Signed   By: Paulina Fusi M.D.   On: 06/05/2023 17:41   CT Head Wo Contrast  Result Date: 06/05/2023 CLINICAL DATA:  Head trauma, moderate-severe fall on eliq; Neck trauma (Age >= 65y) fall w neck pain EXAM: CT HEAD WITHOUT CONTRAST CT CERVICAL SPINE WITHOUT CONTRAST TECHNIQUE: Multidetector CT imaging of the head and cervical spine was performed following the standard protocol without intravenous contrast. Multiplanar CT image reconstructions of the cervical spine were also generated. RADIATION DOSE REDUCTION: This exam was performed according to the departmental dose-optimization program which includes automated exposure control, adjustment of the mA and/or kV according to patient size and/or use of iterative reconstruction technique. COMPARISON:  None Available. FINDINGS: CT HEAD FINDINGS Brain: No hemorrhage. No hydrocephalus. No extra-axial fluid collection. No mass effect. No mass lesion. No CT evidence of an acute cortical infarct.  There is sequela of moderate chronic microvascular ischemic change. Generalized volume loss. Vascular: No hyperdense vessel or unexpected calcification. Skull: Normal. Negative for fracture or focal lesion. Sinuses/Orbits: No middle ear or mastoid effusion. Paranasal sinuses are clear. Bilateral lens replacement. Orbits are otherwise unremarkable. Other: None. CT CERVICAL SPINE FINDINGS Alignment: There is minimally displaced type 3 dens fracture. On the left the fracture extends into the left transverse foramen. Skull base and vertebrae: There is minimally displaced type 3 dens fracture. On the left the fracture extends into the left transverse foramen. Possible additional minimal superior endplate compression deformity at T1. Soft tissues and spinal canal: There is mild prevertebral soft tissue edema. No definite evidence of an epidural hematoma. There is a degenerative pannus at the C1-C2 articulation. Disc levels:  No evidence of high-grade spinal canal stenosis Upper chest: There is a left-sided pleural effusion, unchanged compared to 05/07/2023 Other: None IMPRESSION: 1. No CT evidence of intracranial injury. 2. Minimally displaced type 3 dens fracture. On the left the fracture extends into the left transverse foramen. Recommend further evaluation with a CTA of the neck to exclude vascular injury. 3. Possible additional minimal superior endplate compression deformity at T1. Electronically Signed   By: Lorenza Cambridge M.D.   On: 06/05/2023 13:39   CT Cervical Spine Wo Contrast  Result Date: 06/05/2023 CLINICAL DATA:  Head trauma, moderate-severe fall on eliq; Neck trauma (Age >= 65y) fall w neck pain EXAM: CT HEAD WITHOUT CONTRAST CT CERVICAL SPINE WITHOUT CONTRAST TECHNIQUE: Multidetector CT imaging of the head and cervical spine was performed following the standard protocol without intravenous contrast. Multiplanar CT image reconstructions of the cervical spine were also generated. RADIATION DOSE  REDUCTION: This exam was performed according to the departmental dose-optimization  program which includes automated exposure control, adjustment of the mA and/or kV according to patient size and/or use of iterative reconstruction technique. COMPARISON:  None Available. FINDINGS: CT HEAD FINDINGS Brain: No hemorrhage. No hydrocephalus. No extra-axial fluid collection. No mass effect. No mass lesion. No CT evidence of an acute cortical infarct. There is sequela of moderate chronic microvascular ischemic change. Generalized volume loss. Vascular: No hyperdense vessel or unexpected calcification. Skull: Normal. Negative for fracture or focal lesion. Sinuses/Orbits: No middle ear or mastoid effusion. Paranasal sinuses are clear. Bilateral lens replacement. Orbits are otherwise unremarkable. Other: None. CT CERVICAL SPINE FINDINGS Alignment: There is minimally displaced type 3 dens fracture. On the left the fracture extends into the left transverse foramen. Skull base and vertebrae: There is minimally displaced type 3 dens fracture. On the left the fracture extends into the left transverse foramen. Possible additional minimal superior endplate compression deformity at T1. Soft tissues and spinal canal: There is mild prevertebral soft tissue edema. No definite evidence of an epidural hematoma. There is a degenerative pannus at the C1-C2 articulation. Disc levels:  No evidence of high-grade spinal canal stenosis Upper chest: There is a left-sided pleural effusion, unchanged compared to 05/07/2023 Other: None IMPRESSION: 1. No CT evidence of intracranial injury. 2. Minimally displaced type 3 dens fracture. On the left the fracture extends into the left transverse foramen. Recommend further evaluation with a CTA of the neck to exclude vascular injury. 3. Possible additional minimal superior endplate compression deformity at T1. Electronically Signed   By: Lorenza Cambridge M.D.   On: 06/05/2023 13:39   DG Chest Portable 1  View  Result Date: 06/05/2023 CLINICAL DATA:  Shortness of breath, fall EXAM: PORTABLE CHEST 1 VIEW COMPARISON:  05/07/2023 FINDINGS: Moderate to large left pleural effusion. Part of the left basilar density may be due to the patient's known large left eccentric hiatal hernia containing most of the stomach and also part of the pancreas that was shown on the CT scan from 05/07/2023. Low lung volumes are present, causing crowding of the pulmonary vasculature. Minimal blunting of the right lateral costophrenic angle suggesting a small right pleural effusion. Mildly indistinct pulmonary vasculature favoring pulmonary venous hypertension. Suspected mild enlargement of the cardiopericardial silhouette. Atheromatous vascular calcification of the thoracic aorta IMPRESSION: 1. Moderate to large left pleural effusion. 2. Small right pleural effusion. 3. Mildly indistinct pulmonary vasculature favoring pulmonary venous hypertension. 4. Suspected mild enlargement of the cardiopericardial silhouette. 5. Large left eccentric hiatal hernia containing most of the stomach and also part of the pancreas shown on prior exams, potentially accounting for some of the density at the left lung base. 6. Low lung volumes. Electronically Signed   By: Gaylyn Rong M.D.   On: 06/05/2023 13:30    Pending Labs Unresulted Labs (From admission, onward)    None       Vitals/Pain Today's Vitals   06/06/23 0800 06/06/23 0845 06/06/23 0930 06/06/23 1030  BP: (!) 145/131 (!) 148/113 (!) 137/100 (!) 152/134  Pulse: (!) 107 (!) 180 98 (!) 112  Resp: (!) 21 19 (!) 42 16  Temp: 98 F (36.7 C)     TempSrc: Axillary     SpO2: 97% 94% 98% 95%  PainSc: 10-Worst pain ever       Isolation Precautions No active isolations  Medications Medications  atorvastatin (LIPITOR) tablet 40 mg (40 mg Oral Given 06/05/23 2100)  furosemide (LASIX) tablet 20 mg (20 mg Oral Given 06/05/23 2101)  metoprolol tartrate (LOPRESSOR) tablet  100 mg  (100 mg Oral Given 06/05/23 2101)  pantoprazole (PROTONIX) EC tablet 40 mg (40 mg Oral Given 06/05/23 2100)  potassium chloride SA (KLOR-CON M) CR tablet 20 mEq (20 mEq Oral Given 06/05/23 2100)  sucralfate (CARAFATE) 1 GM/10ML suspension 1 g (1 g Oral Given 06/05/23 2101)  HYDROcodone-acetaminophen (NORCO/VICODIN) 5-325 MG per tablet 1 tablet (1 tablet Oral Given 06/06/23 0514)  acetaminophen (TYLENOL) tablet 650 mg (has no administration in time range)  haloperidol lactate (HALDOL) injection 1 mg (1 mg Intravenous Given 06/06/23 0811)  potassium chloride SA (KLOR-CON M) CR tablet 40 mEq (has no administration in time range)  potassium chloride SA (KLOR-CON M) CR tablet 40 mEq (40 mEq Oral Given 06/05/23 1250)  iohexol (OMNIPAQUE) 350 MG/ML injection 75 mL (75 mLs Intravenous Contrast Given 06/05/23 1617)  morphine (PF) 2 MG/ML injection 2 mg (2 mg Intravenous Given 06/06/23 0232)  prochlorperazine (COMPAZINE) injection 5 mg (5 mg Intravenous Given 06/06/23 1478)    Mobility non-ambulatory     Focused Assessments Musculoskeletal     R Recommendations: See Admitting Provider Note  Report given to:   Additional Notes:

## 2023-06-06 NOTE — Evaluation (Signed)
Clinical/Bedside Swallow Evaluation Patient Details  Name: Sarah Wolf MRN: [id number] Date of Birth: Mar 19, 1935  Today's Date: 06/06/2023 Time: SLP Start Time (ACUTE ONLY): 1600 SLP Stop Time (ACUTE ONLY): 1620 SLP Time Calculation (min) (ACUTE ONLY): 20 min  Past Medical History:  Past Medical History:  Diagnosis Date   Atherosclerosis of native arteries of extremities with rest pain, right leg (HCC)    CHF (congestive heart failure) (HCC)    Diaphragmatic hernia without obstruction or gangrene    GERD (gastroesophageal reflux disease)    History of DVT (deep vein thrombosis)    Hypertension    Peripheral arterial disease (HCC)    Persistent atrial fibrillation (HCC)    Personal history of peptic ulcer disease    Type 2 diabetes mellitus with diabetic peripheral angiopathy without gangrene (HCC)    Unspecified hearing loss, bilateral    Past Surgical History:  Past Surgical History:  Procedure Laterality Date   ABDOMINAL AORTOGRAM W/LOWER EXTREMITY N/A 03/15/2023   Procedure: ABDOMINAL AORTOGRAM W/LOWER EXTREMITY;  Surgeon: Victorino Sparrow, MD;  Location: [facility] INVASIVE CV LAB;  Service: Cardiovascular;  Laterality: N/A;   BIOPSY  03/26/2023   Procedure: BIOPSY;  Surgeon: Benancio Deeds, MD;  Location: [facility] ENDOSCOPY;  Service: Gastroenterology;;   ESOPHAGOGASTRODUODENOSCOPY (EGD) WITH PROPOFOL N/A 03/26/2023   Procedure: ESOPHAGOGASTRODUODENOSCOPY (EGD) WITH PROPOFOL;  Surgeon: Benancio Deeds, MD;  Location: [facility] ENDOSCOPY;  Service: Gastroenterology;  Laterality: N/A;   ORIF FEMUR FRACTURE Right 03/08/2019   Procedure: OPEN REDUCTION INTERNAL FIXATION (ORIF) DISTAL FEMUR FRACTURE;  Surgeon: Kathryne Hitch, MD;  Location: [facility] ORS;  Service: Orthopedics;  Laterality: Right;   PERIPHERAL VASCULAR INTERVENTION Right 03/16/2023   Procedure: PERIPHERAL VASCULAR INTERVENTION;  Surgeon: Cephus Shelling, MD;  Location: [facility] INVASIVE CV LAB;  Service: Cardiovascular;   Laterality: Right;  SFA   PERIPHERAL VASCULAR THROMBECTOMY N/A 03/16/2023   Procedure: LYSIS RECHECK;  Surgeon: Cephus Shelling, MD;  Location: [facility] INVASIVE CV LAB;  Service: Cardiovascular;  Laterality: N/A;   HPI:  Patient is an 88 y.o. female with PMH: HF (EF 50-55% on 04/2023), HTN, persistent A.fib on Eliquis, hx of DVT (2020), HTN, PAD s/p Rt SFA stent, chronic hypoxic respiratory failure due to large hiatal hernia. She presnted to the hospital on 06/05/2023 from home after fall resulting in her hitting her head on the refridgerator. She has had a total of three falls in the past month. She lives alone and her nephew visits and helps with medication and finances. She is otherwise independent and still drives to grocery store.CT head demonstrated minimally displaced type II dens fracture with fracture on the left extending to the left transverse foramen. Neurosurgery consulted and c-collar recommended and f/u in one week.    Assessment / Plan / Recommendation  Clinical Impression  Patient presents with clinical s/s of what appears to be a cognitive-based dysphagia. In addition, she does have a h/o a large hiatal hernia which can impact her esophageal swallow function. Patient demonstrated reduced awareness of PO's when SLP presented thin liquids (water) via straw. Initially, she was not able to drink through straw and was observed to blow into straw instead. When straw was presented again, she was able to drink with mild discoordination and delay of swallow but no overt s/s aspiration. SLP is recommending to downgrade to Dys 1 (puree )solids and continue with thin liquids. SLP will follow for toleration and ability to advance with prognosis being good. SLP Visit Diagnosis: Dysphagia, unspecified (R13.10)  Aspiration Risk  Mild aspiration risk    Diet Recommendation Thin liquid;Dysphagia 1 (Puree)    Liquid Administration via: Straw Medication Administration: Whole meds with  puree Supervision: Full supervision/cueing for compensatory strategies;Staff to assist with self feeding Compensations: Small sips/bites;Slow rate;Minimize environmental distractions Postural Changes: Seated upright at 90 degrees    Other  Recommendations Oral Care Recommendations: Oral care BID    Recommendations for follow up therapy are one component of a multi-disciplinary discharge planning process, led by the attending physician.  Recommendations may be updated based on patient status, additional functional criteria and insurance authorization.  Follow up Recommendations Other (comment) (TBD)      Assistance Recommended at Discharge    Functional Status Assessment Patient has had a recent decline in their functional status and demonstrates the ability to make significant improvements in function in a reasonable and predictable amount of time.  Frequency and Duration min 2x/week  1 week       Prognosis Prognosis for improved oropharyngeal function: Good Barriers to Reach Goals: Cognitive deficits      Swallow Study   General Date of Onset: 06/05/23 HPI: Patient is an 88 y.o. female with PMH: HF (EF 50-55% on 04/2023), HTN, persistent A.fib on Eliquis, hx of DVT (2020), HTN, PAD s/p Rt SFA stent, chronic hypoxic respiratory failure due to large hiatal hernia. She presnted to the hospital on 06/05/2023 from home after fall resulting in her hitting her head on the refridgerator. She has had a total of three falls in the past month. She lives alone and her nephew visits and helps with medication and finances. She is otherwise independent and still drives to grocery store.CT head demonstrated minimally displaced type II dens fracture with fracture on the left extending to the left transverse foramen. Neurosurgery consulted and c-collar recommended and f/u in one week. Type of Study: Bedside Swallow Evaluation Previous Swallow Assessment: during previous admissions in August and September  of 2024 Diet Prior to this Study: Dysphagia 3 (mechanical soft);Thin liquids (Level 0) Temperature Spikes Noted: No Respiratory Status: Room air Behavior/Cognition: Alert;Cooperative;Pleasant mood;Confused Oral Cavity Assessment: Within Functional Limits Oral Care Completed by SLP: No Oral Cavity - Dentition: Adequate natural dentition Self-Feeding Abilities: Total assist Patient Positioning: Upright in bed Baseline Vocal Quality: Normal Volitional Cough: Cognitively unable to elicit Volitional Swallow: Unable to elicit    Oral/Motor/Sensory Function Overall Oral Motor/Sensory Function: Within functional limits   Ice Chips     Thin Liquid Thin Liquid: Impaired Presentation: Straw Oral Phase Impairments: Poor awareness of bolus Pharyngeal  Phase Impairments: Suspected delayed Swallow    Nectar Thick     Honey Thick     Puree Puree: Not tested   Solid     Solid: Not tested     Angela Nevin, MA, CCC-SLP Speech Therapy

## 2023-06-06 NOTE — ED Notes (Signed)
Pt very delirious and hallucinating. RN reached put top MD for medication. MD wrote orders for haldol.

## 2023-06-06 NOTE — ED Notes (Signed)
RN reached out to MD and MD stated pt does not need RN to go with pt to IR. RN updated IR as well.

## 2023-06-06 NOTE — ED Notes (Signed)
IR staff to room to transport pt.

## 2023-06-06 NOTE — Progress Notes (Addendum)
[facility]                                                                               Sarah Wolf, is a 88 y.o. female, DOB - 19-Nov-1934, WUJ:[id number] Admit date - 06/05/2023    Outpatient Primary MD for the patient is Sarah Andrew, NP  LOS - 0  days    Brief summary   Sarah Wolf is a 88 y.o. female with medical history significant of combined systolic and diastolic CHF (EF 95-62% on 04/2023), HTN, persistent A.fib on Eliquis, hx of DVT (2020), HTN, PAD s/p Rt SFA stent, chronic hypoxic respiratory failure though secondary to large hiatal hernia who presents with recurrent falls.    Assessment & Plan    Assessment and Plan: * Pericardial effusion - CT imaging showing large pericardial effusion that is probably increased from chest CT done a few months ago -Last echocardiogram on 05/05/2023 showed trivial pericardial effusion.   Repeat echocardiogram ordered and pending.  She denies any chest pain or sob.   Dens fracture (HCC) -Minimally displaced type 3 dens fracture. On the left the fracture extends into the left transverse foramen. -No vascular injury on CTA neck -Neurosurgery was consulted and recommended follow-up outpatient in a week -Continue c-collar. Pain control.  - therapy evaluations ordered and discussed with family at bedside, plan for snf when medically stable.    Left Pleural effusion - Patient with history of combined systolic and diastolic heart failure  - CT shows large left pleural effusion.  - IR consulted for thoracentesis, but surprisingly there is only scant fluid not amenable for thoracentesis.  - she is currently on 3 lit of [place] oxygen.  - no signs of infection found.   Persistent atrial fibrillation (HCC) -rate better controlled with pain control.  She is on metoprolol, continue the same.  Patient on amiodarone, which was discontinued earlier this month due to thyroid issues.  Restart Eliquis as thoracentesis couldn't be  done.   History of dysphagia - Continue dysphasic diet as per SLP recommendations.    Abnormal TSH - Patient with abnormal TSH on 11/6 thought potentially to be amiodarone induced.  Has discontinued amiodarone since 11/8. -TSH elevated  at 55 but improved from 68.  Free  t4 is low at 0.38.  Recommend checking thyroid panel in 4 to 6 weeks.    Chronic combined systolic (congestive) and diastolic (congestive) heart failure (HCC) -05/05/2023- EF of 50-55% with global hypokinesis. LV diastolic indeterminate. Moderately elevated pulmonary artery systolic pressure. Small pericardial effusion. Trivial MV regurgitation. Mild to moderate TV regurgitation.  - she appears slightly dehydrated with elevated creatinine from her baseline.  - hold lasix for now and monitor.   Fall at home, initial encounter - Unclear cause.  Patient reports fall being due to unsteady gait.  However nephew was concerned that she has been feeling dizziness. -Fall could be multifactorial from her chronic hypoxia with questionable adherence to her supplemental oxygen, deconditioning  and enlarging pericardial effusion -PT evaluation, OT evaluation and possible SNF . Family is agreeable.   Chronic hypoxic respiratory failure (HCC) - Thought secondary to large hiatal hernia -Currently remains on 2  L via nasal cannula.  Questions patient's compliance at home.  Hypokalemia Replaced, check magnesium level and repeat levels in am.   Pure hypercholesterolemia - Continue statin  PAD (peripheral artery disease) (HCC) - S/p right SFA stent - Resume eliquis.    Acute metabolic encephalopathy: Thought to be secondary hospital delirium vs neck pain vs from pain meds.  Prn haldol.      Estimated body mass index is 23.3 kg/m as calculated from the following:   Height as of 05/30/23: 5\' 5"  (1.651 m).   Weight as of 05/30/23: 63.5 kg.  Code Status: DNR - LIMITED DVT Prophylaxis:  ELIQUIS.   Level of Care: Level of  care: Telemetry Medical Family Communication: Discussed with family at bedside.   Disposition Plan:     Remains inpatient appropriate:  SNF placement.   Procedures:  ECHOCARDIOGRAM.                                                        Consultants:   None  Antimicrobials:   Anti-infectives (From admission, onward)    None        Medications  Scheduled Meds:  atorvastatin  40 mg Oral Daily   metoprolol tartrate  100 mg Oral BID   pantoprazole  40 mg Oral BID   potassium chloride SA  20 mEq Oral Q M,W,F   sucralfate  1 g Oral BID   Continuous Infusions: PRN Meds:.acetaminophen, haloperidol lactate, HYDROcodone-acetaminophen    Subjective:   [name] was seen and examined today.  Appears comfortable, wants to eat. No chest pain.   Objective:   Vitals:   06/06/23 1030 06/06/23 1115 06/06/23 1200 06/06/23 1442  BP: (!) 152/134 (!) 146/88 (!) 145/80 (!) 141/102  Pulse: (!) 112 90 100 (!) 101  Resp: 16 12 19 19   Temp:   98 F (36.7 C) 99.5 F (37.5 C)  TempSrc:   Oral   SpO2: 95% 96% 98% (!) 84%    Intake/Output Summary (Last 24 hours) at 06/06/2023 1742 Last data filed at 06/06/2023 1408 Gross per 24 hour  Intake --  Output 400 ml  Net -400 ml   There were no vitals filed for this visit.   Exam General exam: elderly woman in neck collar, appears comfortable.  Respiratory system: air entry fair, on 3 lit of [place] oxygen. No wheezing heard.  Cardiovascular system: S1 & S2 heard, RRR. Gastrointestinal system: Abdomen is nondistended, soft and nontender.  Central nervous system: Alert , Confused.  Extremities: no pedal edema.  Skin: No rashes,  Psychiatry: alert but confused.    Data Reviewed:  I have personally reviewed following labs and imaging studies   CBC Lab Results  Component Value Date   WBC 8.9 06/06/2023   RBC 3.72 (L) 06/06/2023   HGB 10.8 (L) 06/06/2023   HCT 34.0 (L) 06/06/2023   MCV 91.4 06/06/2023   MCH 29.0 06/06/2023   PLT  265 06/06/2023   MCHC 31.8 06/06/2023   RDW 17.4 (H) 06/06/2023   LYMPHSABS 0.8 06/05/2023   MONOABS 0.3 06/05/2023   EOSABS 0.0 06/05/2023   BASOSABS 0.0 06/05/2023     Last metabolic panel Lab Results  Component Value Date   NA 135 06/06/2023   K 3.4 (L) 06/06/2023   CL 95 (L) 06/06/2023   CO2  28 06/06/2023   BUN 16 06/06/2023   CREATININE 1.14 (H) 06/06/2023   GLUCOSE 136 (H) 06/06/2023   GFRNONAA 47 (L) 06/06/2023   GFRAA >60 03/11/2019   CALCIUM 9.2 06/06/2023   PHOS 1.5 (L) 03/30/2023   PROT 6.1 (L) 05/05/2023   ALBUMIN 3.1 (L) 05/05/2023   LABGLOB 2.5 04/26/2023   BILITOT 1.1 05/05/2023   ALKPHOS 52 05/05/2023   AST 23 05/05/2023   ALT 17 05/05/2023   ANIONGAP 12 06/06/2023    CBG (last 3)  No results for input(s): "GLUCAP" in the last 72 hours.    Coagulation Profile: No results for input(s): "INR", "PROTIME" in the last 168 hours.   Radiology Studies: IR US CHEST  Result Date: 06/06/2023 INDICATION: 88 year old female. Incidental finding of left-sided pleural effusion on chest x-ray. Team is requesting a therapeutic and diagnostic left-sided thoracentesis. EXAM: Limited LEFT CHEST ULTRASOUND COMPARISON:  Chest XR, 06/05/2023.  CT chest, 05/07/2023. FINDINGS: Focused ultrasound along the LEFT chest. Images demonstrating trace LEFT pleural effusion. No significant pocket of fluid or percutaneous window to allow safe thoracentesis. Risks outweigh the benefits. IMPRESSION: Trace LEFT pleural effusion without a safe window for percutaneous access Thoracentesis was NOT performed. Performed by Anders Grant NP Electronically Signed   By: Roanna Banning M.D.   On: 06/06/2023 11:03   CT Angio Neck W and/or Wo Contrast  Result Date: 06/05/2023 CLINICAL DATA:  Cervical spine fracture. Type 3 dens fracture with extension to the transverse foramen on the left. EXAM: CT ANGIOGRAPHY NECK TECHNIQUE: Multidetector CT imaging of the neck was performed using the standard  protocol during bolus administration of intravenous contrast. Multiplanar CT image reconstructions and MIPs were obtained to evaluate the vascular anatomy. Carotid stenosis measurements (when applicable) are obtained utilizing NASCET criteria, using the distal internal carotid diameter as the denominator. RADIATION DOSE REDUCTION: This exam was performed according to the departmental dose-optimization program which includes automated exposure control, adjustment of the mA and/or kV according to patient size and/or use of iterative reconstruction technique. CONTRAST:  75mL OMNIPAQUE IOHEXOL 350 MG/ML SOLN COMPARISON:  Cervical spine CT same day FINDINGS: Aortic arch: Aortic atherosclerosis. Large pericardial effusion is noted, probably increased from the chest CT done 1 month ago. Right carotid system: Common carotid artery widely patent to the bifurcation. Soft and calcified plaque at the carotid bifurcation and ICA bulb. No stenosis greater than 20%. Stenosis in the cervical ICA beyond the bulb shows a minimal diameter of 2.5 mm. Compared to the normal diameter of 5 mm, this indicates a 50% stenosis. Left carotid system: Common carotid artery widely patent to the bifurcation. Calcified plaque at the carotid bifurcation and ICA bulb. No stenosis. Vertebral arteries: Atherosclerotic plaque at both vertebral artery origins. No stenosis on the left. On the right, there is a 50% stenosis. Atherosclerotic disease affecting the proximal right vertebral artery but without further stenosis greater than 20%. Beyond that the vessels are widely patent through the cervical region. This includes at the C2 level where there is no evidence intimal injury. Skeleton: Type 3 fracture of the dens as shown by the prior cervical spine Other neck: No mass or lymphadenopathy. Upper chest: Large left effusion with volume loss of the left lung. IMPRESSION: 1. No evidence of arterial injury in the neck, with specific attention to the C2  level. 2. 50% stenosis of the cervical ICA beyond the bulb on the right. 3. 50% stenosis of the right vertebral artery origin. 4. Large pericardial effusion, probably increased from  the chest CT done 1 month ago. 5. Large left pleural effusion with volume loss of the left lung. 6. Type 3 fracture of the dens as shown by the prior cervical spine CT. Aortic Atherosclerosis (ICD10-I70.0). Electronically Signed   By: Paulina Fusi M.D.   On: 06/05/2023 17:41   CT Head Wo Contrast  Result Date: 06/05/2023 CLINICAL DATA:  Head trauma, moderate-severe fall on eliq; Neck trauma (Age >= 65y) fall w neck pain EXAM: CT HEAD WITHOUT CONTRAST CT CERVICAL SPINE WITHOUT CONTRAST TECHNIQUE: Multidetector CT imaging of the head and cervical spine was performed following the standard protocol without intravenous contrast. Multiplanar CT image reconstructions of the cervical spine were also generated. RADIATION DOSE REDUCTION: This exam was performed according to the departmental dose-optimization program which includes automated exposure control, adjustment of the mA and/or kV according to patient size and/or use of iterative reconstruction technique. COMPARISON:  None Available. FINDINGS: CT HEAD FINDINGS Brain: No hemorrhage. No hydrocephalus. No extra-axial fluid collection. No mass effect. No mass lesion. No CT evidence of an acute cortical infarct. There is sequela of moderate chronic microvascular ischemic change. Generalized volume loss. Vascular: No hyperdense vessel or unexpected calcification. Skull: Normal. Negative for fracture or focal lesion. Sinuses/Orbits: No middle ear or mastoid effusion. Paranasal sinuses are clear. Bilateral lens replacement. Orbits are otherwise unremarkable. Other: None. CT CERVICAL SPINE FINDINGS Alignment: There is minimally displaced type 3 dens fracture. On the left the fracture extends into the left transverse foramen. Skull base and vertebrae: There is minimally displaced type 3 dens  fracture. On the left the fracture extends into the left transverse foramen. Possible additional minimal superior endplate compression deformity at T1. Soft tissues and spinal canal: There is mild prevertebral soft tissue edema. No definite evidence of an epidural hematoma. There is a degenerative pannus at the C1-C2 articulation. Disc levels:  No evidence of high-grade spinal canal stenosis Upper chest: There is a left-sided pleural effusion, unchanged compared to 05/07/2023 Other: None IMPRESSION: 1. No CT evidence of intracranial injury. 2. Minimally displaced type 3 dens fracture. On the left the fracture extends into the left transverse foramen. Recommend further evaluation with a CTA of the neck to exclude vascular injury. 3. Possible additional minimal superior endplate compression deformity at T1. Electronically Signed   By: Lorenza Cambridge M.D.   On: 06/05/2023 13:39   CT Cervical Spine Wo Contrast  Result Date: 06/05/2023 CLINICAL DATA:  Head trauma, moderate-severe fall on eliq; Neck trauma (Age >= 65y) fall w neck pain EXAM: CT HEAD WITHOUT CONTRAST CT CERVICAL SPINE WITHOUT CONTRAST TECHNIQUE: Multidetector CT imaging of the head and cervical spine was performed following the standard protocol without intravenous contrast. Multiplanar CT image reconstructions of the cervical spine were also generated. RADIATION DOSE REDUCTION: This exam was performed according to the departmental dose-optimization program which includes automated exposure control, adjustment of the mA and/or kV according to patient size and/or use of iterative reconstruction technique. COMPARISON:  None Available. FINDINGS: CT HEAD FINDINGS Brain: No hemorrhage. No hydrocephalus. No extra-axial fluid collection. No mass effect. No mass lesion. No CT evidence of an acute cortical infarct. There is sequela of moderate chronic microvascular ischemic change. Generalized volume loss. Vascular: No hyperdense vessel or unexpected  calcification. Skull: Normal. Negative for fracture or focal lesion. Sinuses/Orbits: No middle ear or mastoid effusion. Paranasal sinuses are clear. Bilateral lens replacement. Orbits are otherwise unremarkable. Other: None. CT CERVICAL SPINE FINDINGS Alignment: There is minimally displaced type 3 dens fracture. On the  left the fracture extends into the left transverse foramen. Skull base and vertebrae: There is minimally displaced type 3 dens fracture. On the left the fracture extends into the left transverse foramen. Possible additional minimal superior endplate compression deformity at T1. Soft tissues and spinal canal: There is mild prevertebral soft tissue edema. No definite evidence of an epidural hematoma. There is a degenerative pannus at the C1-C2 articulation. Disc levels:  No evidence of high-grade spinal canal stenosis Upper chest: There is a left-sided pleural effusion, unchanged compared to 05/07/2023 Other: None IMPRESSION: 1. No CT evidence of intracranial injury. 2. Minimally displaced type 3 dens fracture. On the left the fracture extends into the left transverse foramen. Recommend further evaluation with a CTA of the neck to exclude vascular injury. 3. Possible additional minimal superior endplate compression deformity at T1. Electronically Signed   By: Lorenza Cambridge M.D.   On: 06/05/2023 13:39   DG Chest Portable 1 View  Result Date: 06/05/2023 CLINICAL DATA:  Shortness of breath, fall EXAM: PORTABLE CHEST 1 VIEW COMPARISON:  05/07/2023 FINDINGS: Moderate to large left pleural effusion. Part of the left basilar density may be due to the patient's known large left eccentric hiatal hernia containing most of the stomach and also part of the pancreas that was shown on the CT scan from 05/07/2023. Low lung volumes are present, causing crowding of the pulmonary vasculature. Minimal blunting of the right lateral costophrenic angle suggesting a small right pleural effusion. Mildly indistinct  pulmonary vasculature favoring pulmonary venous hypertension. Suspected mild enlargement of the cardiopericardial silhouette. Atheromatous vascular calcification of the thoracic aorta IMPRESSION: 1. Moderate to large left pleural effusion. 2. Small right pleural effusion. 3. Mildly indistinct pulmonary vasculature favoring pulmonary venous hypertension. 4. Suspected mild enlargement of the cardiopericardial silhouette. 5. Large left eccentric hiatal hernia containing most of the stomach and also part of the pancreas shown on prior exams, potentially accounting for some of the density at the left lung base. 6. Low lung volumes. Electronically Signed   By: Gaylyn Rong M.D.   On: 06/05/2023 13:30       Kathlen Mody M.D. [facility] 06/06/2023, 5:42 PM  Available via Epic secure chat 7am-7pm After 7 pm, please refer to night coverage provider listed on amion.

## 2023-06-06 NOTE — Telephone Encounter (Signed)
Called power of attorney advised of below they verbalized understanding, Patient fell again and is in hospital.

## 2023-06-06 NOTE — Progress Notes (Signed)
  Echocardiogram 2D Echocardiogram has been performed.  Leda Roys RDCS 06/06/2023, 1:48 PM

## 2023-06-06 NOTE — Telephone Encounter (Signed)
-----   Message from [name] sent at 06/05/2023  4:27 PM EST ----- Please let Sarah Wolf know that her forearm xray shows no evidence of fracture. Would recommend follow up with PCP for for any ongoing concerns related to arm swelling or pain.

## 2023-06-06 NOTE — ED Notes (Signed)
Notified [facility]. [facility], via secure chat, in regards to the patient having visual hallucinations, the patient is having visual hallucinations and believes she is falling out of bed, she is in a panic, gripping the hand rails of the bed in fear, which has resulted in more significant bruising to her right hand and forearm and her skin tear has increased in size due hitting it on the bed rails. The loose steri strips were removed, cleansed with NS and replaced it with a mepilex dressing for the padding. She is so fearful and yelling out, reorientation has not helped.

## 2023-06-06 NOTE — Care Management Obs Status (Signed)
MEDICARE OBSERVATION STATUS NOTIFICATION   Patient Details  Name: Sarah Wolf MRN: [id number] Date of Birth: 15-Aug-1934   Medicare Observation Status Notification Given:  Yes    Oletta Cohn, RN 06/06/2023, 9:14 AM

## 2023-06-07 DIAGNOSIS — I3139 Other pericardial effusion (noninflammatory): Secondary | ICD-10-CM | POA: Diagnosis not present

## 2023-06-07 LAB — CBC WITH DIFFERENTIAL/PLATELET
Abs Immature Granulocytes: 0.04 10*3/uL (ref 0.00–0.07)
Basophils Absolute: 0 10*3/uL (ref 0.0–0.1)
Basophils Relative: 0 %
Eosinophils Absolute: 0 10*3/uL (ref 0.0–0.5)
Eosinophils Relative: 0 %
HCT: 35.6 % — ABNORMAL LOW (ref 36.0–46.0)
Hemoglobin: 11.3 g/dL — ABNORMAL LOW (ref 12.0–15.0)
Immature Granulocytes: 1 %
Lymphocytes Relative: 13 %
Lymphs Abs: 1.1 10*3/uL (ref 0.7–4.0)
MCH: 29.1 pg (ref 26.0–34.0)
MCHC: 31.7 g/dL (ref 30.0–36.0)
MCV: 91.8 fL (ref 80.0–100.0)
Monocytes Absolute: 0.6 10*3/uL (ref 0.1–1.0)
Monocytes Relative: 7 %
Neutro Abs: 6.5 10*3/uL (ref 1.7–7.7)
Neutrophils Relative %: 79 %
Platelets: 223 10*3/uL (ref 150–400)
RBC: 3.88 MIL/uL (ref 3.87–5.11)
RDW: 17.9 % — ABNORMAL HIGH (ref 11.5–15.5)
WBC: 8.3 10*3/uL (ref 4.0–10.5)
nRBC: 0 % (ref 0.0–0.2)

## 2023-06-07 LAB — BASIC METABOLIC PANEL
Anion gap: 11 (ref 5–15)
BUN: 20 mg/dL (ref 8–23)
CO2: 28 mmol/L (ref 22–32)
Calcium: 9.1 mg/dL (ref 8.9–10.3)
Chloride: 98 mmol/L (ref 98–111)
Creatinine, Ser: 0.97 mg/dL (ref 0.44–1.00)
GFR, Estimated: 57 mL/min — ABNORMAL LOW (ref >60)
Glucose, Bld: 107 mg/dL — ABNORMAL HIGH (ref 70–99)
Potassium: 3.4 mmol/L — ABNORMAL LOW (ref 3.5–5.1)
Sodium: 137 mmol/L (ref 135–145)

## 2023-06-07 MED ORDER — ADULT MULTIVITAMIN W/MINERALS CH
1.0000 | ORAL_TABLET | Freq: Every day | ORAL | Status: DC
  Administered 2023-06-07 – 2023-06-09 (×3): 1 via ORAL
  Filled 2023-06-07 (×3): qty 1

## 2023-06-07 MED ORDER — POTASSIUM CHLORIDE CRYS ER 20 MEQ PO TBCR
40.0000 meq | EXTENDED_RELEASE_TABLET | Freq: Once | ORAL | Status: AC
  Administered 2023-06-07: 40 meq via ORAL
  Filled 2023-06-07: qty 2

## 2023-06-07 MED ORDER — HALOPERIDOL LACTATE 5 MG/ML IJ SOLN: 2.0000 mg | Freq: Four times a day (QID) | INTRAMUSCULAR | Status: DC | PRN

## 2023-06-07 NOTE — TOC CAGE-AID Note (Signed)
Transition of Care [facility]) - CAGE-AID Screening   Patient Details  Name: Sarah Wolf MRN: [id number] Date of Birth: [birth year]  Transition of [facility]) CM/SW Contact:    Janora Norlander, RN Phone Number: [phone] 06/07/2023, 10:26 AM   Clinical Narrative: Pt here after sustaining a minimally displaced type 2 dens fracture due to a fall.  Pt reports she fell the day prior to coming into the hospital and is on blood thinners.  Pt is wearing her [place] J c-collar and it is on correctly at this time.  Pt c/o no pain.  Pt also denies drug or alcohol use.  Screening complete.    CAGE-AID Screening:    Have You Ever Felt You Ought to Cut Down on Your Drinking or Drug Use?: No Have People Annoyed You By Critizing Your Drinking Or Drug Use?: No Have You Felt Bad Or Guilty About Your Drinking Or Drug Use?: No Have You Ever Had a Drink or Used Drugs First Thing In The Morning to Steady Your Nerves or to Get Rid of a Hangover?: No CAGE-AID Score: 0  Substance Abuse Education Offered: No

## 2023-06-07 NOTE — Evaluation (Signed)
Occupational Therapy Evaluation Patient Details Name: Sarah Wolf MRN: [id number] DOB: 1934/09/16 Today's Date: 06/07/2023   History of Present Illness Pt is 88 yo female who lives alone and presents on 06/05/23 after being found down at home by her nephew with O2 off. Fell and hit head on refrigerator. Pt with type 3 dens fx with likely concussion. Pt also with pericardial effusion.  PMH:  CHF (EF 50-55% on 04/2023), HTN, persistent A.fib on Eliquis, hx of DVT (2020), HTN, PAD s/p Rt SFA stent, chronic hypoxic respiratory failure due to hiatal hernia   Clinical Impression   Pt cognition look much more improved than on IE with PT. Pt following commands with more time and more receptive to moving EOB but elicits a fear of falling. OT comforting pt and ensuring her safety, she demonstrates increased need for assist with LB ADLs and needs min A to help steady and produce side steps EOB. Adjusted Pt's miami collar as it was noted to be poking into her skin, she reports improvements in pain after adjusting her cushions. Pt would benefit from continued acute skilled OT services to address deficits and help transition to next level of care. Patient would benefit from post acute skilled rehab facility with <3 hours of therapy and 24/7 support        If plan is discharge home, recommend the following: A little help with walking and/or transfers;A little help with bathing/dressing/bathroom;Assistance with cooking/housework;Supervision due to cognitive status    Functional Status Assessment  Patient has had a recent decline in their functional status and demonstrates the ability to make significant improvements in function in a reasonable and predictable amount of time.  Equipment Recommendations  None recommended by OT (defer to next level of care)    Recommendations for Other Services       Precautions / Restrictions Precautions Precautions: Fall Precaution Comments: pt had fallen and hit head  on refrigerator. Assumed C-spine precautions but no formal orders Required Braces or Orthoses: Cervical Brace Cervical Brace: Hard collar;At all times Restrictions Weight Bearing Restrictions: No      Mobility Bed Mobility Overal bed mobility: Needs Assistance Bed Mobility: Rolling Rolling: Used rails, Contact guard assist   Supine to sit: Min assist, Used rails     General bed mobility comments: cues for pt to use bed rails and min A to raise trunk into upright position    Transfers Overall transfer level: Needs assistance Equipment used: Rolling walker (2 wheels) Transfers: Sit to/from Stand Sit to Stand: Mod assist           General transfer comment: side steps at bedside Min A with cues to comprehend lateral steps      Balance Overall balance assessment: History of Falls, Needs assistance   Sitting balance-Leahy Scale: Fair Sitting balance - Comments: occasionally needs cues to keep Bilat feet on floor   Standing balance support: Reliant on assistive device for balance, Bilateral upper extremity supported Standing balance-Leahy Scale: Poor Standing balance comment: steps at bedside                           ADL either performed or assessed with clinical judgement   ADL Overall ADL's : Needs assistance/impaired Eating/Feeding: Independent;Sitting Eating/Feeding Details (indicate cue type and reason): eating sitting EOB with SLP team Grooming: Sitting;Contact guard assist   Upper Body Bathing: Sitting;Contact guard assist   Lower Body Bathing: Sitting/lateral leans;Minimal assistance   Upper Body Dressing :  Sitting;Minimal assistance   Lower Body Dressing: Sitting/lateral leans;Moderate assistance   Toilet Transfer: Minimal assistance;Stand-pivot;Rolling walker (2 wheels)   Toileting- Clothing Manipulation and Hygiene: Sit to/from stand;Moderate assistance               Vision         Perception         Praxis          Pertinent Vitals/Pain Pain Assessment Pain Assessment: Faces Faces Pain Scale: Hurts even more Pain Location: neck- underneath c collar Pain Descriptors / Indicators: Aching, Sore, Sharp Pain Intervention(s): Limited activity within patient's tolerance, Monitored during session, Repositioned (adjusted collar as it was noted to be poking into pt's skin. cushions adjusted around pt's neck)     Extremity/Trunk Assessment Upper Extremity Assessment Upper Extremity Assessment: Generalized weakness;RUE deficits/detail (LUE ROM WFL, 3/5 MMT except 4/5 elbow flexors) RUE Deficits / Details: shoulder flexion limited to ~30*, 3/5 MMT elbow ext, 4/5 elbow flexion           Communication Communication Communication: No apparent difficulties Cueing Techniques: Verbal cues;Visual cues   Cognition Arousal: Alert Behavior During Therapy: Anxious   Area of Impairment: Following commands, Awareness, Memory                     Memory: Decreased short-term memory Following Commands: Follows one step commands with increased time   Awareness: Emergent   General Comments: A&Ox4, exhibits a fear of falling. Recalls some events in the ED regarding when she stated "a dog was walking on the ceiling?"     General Comments  VSS on 2L, Sp02 95% but desats to 92% on RA. Pt initially reports she does not use home 02 which seemed a bit contradictory to H&P. Pt later reports that she thinks she does use home 02.    Exercises     Shoulder Instructions      Home Living Family/patient expects to be discharged to:: Private residence Living Arrangements: Alone Available Help at Discharge: Family;Available PRN/intermittently Type of Home: House Home Access: Ramped entrance     Home Layout: One level     Bathroom Shower/Tub: Sponge bathes at baseline   Bathroom Toilet: Handicapped height Bathroom Accessibility: Yes   Home Equipment: Cane - single point;Rolling Walker (2 wheels);Grab bars  - toilet   Additional Comments: home info from last admission      Prior Functioning/Environment Prior Level of Function : Independent/Modified Independent;Driving             Mobility Comments: Using RW at home but in chart there is a note that nephew noted RW across the room from pt ADLs Comments: ind        OT Problem List: Decreased strength;Impaired balance (sitting and/or standing);Pain;Decreased cognition      OT Treatment/Interventions: Self-care/ADL training;Visual/perceptual remediation/compensation;Therapeutic exercise;Patient/family education;Therapeutic activities;Balance training    OT Goals(Current goals can be found in the care plan section) Acute Rehab OT Goals Patient Stated Goal: to get better and go home OT Goal Formulation: With patient Time For Goal Achievement: 06/21/23 Potential to Achieve Goals: Good ADL Goals Pt Will Perform Grooming: with supervision;standing Pt Will Perform Lower Body Bathing: with set-up;sitting/lateral leans;with supervision Pt Will Perform Lower Body Dressing: with supervision;sit to/from stand Pt Will Transfer to Toilet: with supervision;ambulating  OT Frequency: Min 1X/week    Co-evaluation              AM-PAC OT "6 Clicks" Daily Activity     Outcome Measure  Help from another person eating meals?: None Help from another person taking care of personal grooming?: A Little Help from another person toileting, which includes using toliet, bedpan, or urinal?: A Lot Help from another person bathing (including washing, rinsing, drying)?: A Little Help from another person to put on and taking off regular upper body clothing?: A Little Help from another person to put on and taking off regular lower body clothing?: A Lot 6 Click Score: 17   End of Session Equipment Utilized During Treatment: Gait belt;Rolling walker (2 wheels);Cervical collar;Oxygen Nurse Communication: Mobility status (would recommend up with therapy until  she builds more confidence. +2 close pivot for safety with other staff)  Activity Tolerance: Patient tolerated treatment well Patient left: in bed;with call bell/phone within reach;with bed alarm set  OT Visit Diagnosis: Unsteadiness on feet (R26.81);History of falling (Z91.81);Muscle weakness (generalized) (M62.81);Pain;Other symptoms and signs involving cognitive function Pain - part of body:  (neck)                Time: 1655-1730 OT Time Calculation (min): 35 min Charges:  OT General Charges $OT Visit: 1 Visit OT Evaluation $OT Eval Moderate Complexity: 1 Mod OT Treatments $Therapeutic Activity: 8-22 mins  06/07/2023  AB, OTR/L  Acute Rehabilitation Services  Office: [phone]   Tristan Schroeder 06/07/2023, 5:30 PM

## 2023-06-07 NOTE — Progress Notes (Signed)
Patient removed IV, hospitalist notified and states okay to hold IV and to reassess the needed if anything changes.

## 2023-06-07 NOTE — NC FL2 (Signed)
[place] MEDICAID FL2 LEVEL OF CARE FORM     IDENTIFICATION  Patient Name: Sarah Wolf Birthdate: 17-May-1935 Sex: female Admission Date (Current Location): 06/05/2023  [facility] and [place] Number:  Producer, television/film/video and Address:  [facility], [address] [place], [place] [address]      Provider Number: [id number]  Attending Physician Name and Address:  Jonah Blue, MD  Relative Name and Phone Number:       Current Level of Care: Hospital Recommended Level of Care: Skilled Nursing Facility Prior Approval Number:    Date Approved/Denied:   PASRR Number: [id number]  Discharge Plan: SNF    Current Diagnoses: Patient Active Problem List   Diagnosis Date Noted   Pericardial effusion 06/05/2023   Hypokalemia 06/05/2023   Chronic hypoxic respiratory failure (HCC) 06/05/2023   Fall at home, initial encounter 06/05/2023   Pleural effusion 06/05/2023   Chronic combined systolic (congestive) and diastolic (congestive) heart failure (HCC) 06/05/2023   Abnormal TSH 06/05/2023   Dens fracture (HCC) 06/05/2023   History of dysphagia 06/05/2023   PAD (peripheral artery disease) (HCC) 04/20/2023   Pure hypercholesterolemia 04/20/2023   Porokeratosis 04/10/2023   Esophagitis 03/26/2023   Gastritis and gastroduodenitis 03/26/2023   Duodenal ulcer 03/26/2023   History of GI bleed 03/24/2023   Anticoagulated 03/24/2023   Aspiration into airway 03/17/2023   Acute respiratory failure with hypoxia (HCC) 03/15/2023   Goals of care, counseling/discussion 03/15/2023   Persistent atrial fibrillation (HCC) 03/13/2023   Critical limb ischemia of right lower extremity (HCC) 03/13/2023   Thrombus of right atrial appendage 03/13/2023   Myocardial injury due to Afib 03/13/2023   Heart failure with mildly reduced ejection fraction (HFmrEF, 41-49%) (HCC) 03/13/2023   History of DVT (deep vein thrombosis) 03/13/2023   Lesion of mediastinum 03/13/2023    Hyponatremia 03/13/2023   Pain due to onychomycosis of toenails of both feet 01/06/2023   Hip fracture (HCC) 03/08/2019   Closed comminuted intra-articular fracture of distal femur, right, initial encounter (HCC)    Closed displaced supracondylar fracture with intracondylar extension of lower end of left femur (HCC)     Orientation RESPIRATION BLADDER Height & Weight     Self  O2 ( 2L) Incontinent Weight:   Height:     BEHAVIORAL SYMPTOMS/MOOD NEUROLOGICAL BOWEL NUTRITION STATUS      Incontinent Diet (See DC summary)  AMBULATORY STATUS COMMUNICATION OF NEEDS Skin   Extensive Assist Verbally Skin abrasions (Bilateral hand skin tears)                       Personal Care Assistance Level of Assistance  Bathing, Feeding, Dressing Bathing Assistance: Maximum assistance Feeding assistance: Limited assistance Dressing Assistance: Maximum assistance     Functional Limitations Info  Sight, Hearing, Speech Sight Info: Adequate Hearing Info: Adequate Speech Info: Adequate    SPECIAL CARE FACTORS FREQUENCY  PT (By licensed PT), OT (By licensed OT)     PT Frequency: 5x week OT Frequency: 5x week            Contractures Contractures Info: Not present    Additional Factors Info  Code Status, Allergies Code Status Info: DNR Allergies Info: Penicillin G           Current Medications (06/07/2023):  This is the current hospital active medication list Current Facility-Administered Medications  Medication Dose Route Frequency Provider Last Rate Last Admin   acetaminophen (TYLENOL) tablet 650 mg  650 mg  Oral Q6H PRN Tu, Ching T, DO       apixaban (ELIQUIS) tablet 5 mg  5 mg Oral BID Kathlen Mody, MD   5 mg at 06/07/23 1009   atorvastatin (LIPITOR) tablet 40 mg  40 mg Oral Daily Tu, Ching T, DO   40 mg at 06/07/23 1009   haloperidol lactate (HALDOL) injection 1 mg  1 mg Intravenous Q6H PRN Kathlen Mody, MD   1 mg at 06/06/23 1443   HYDROcodone-acetaminophen  (NORCO/VICODIN) 5-325 MG per tablet 1 tablet  1 tablet Oral Q6H PRN Tu, Ching T, DO   1 tablet at 06/06/23 1554   metoprolol tartrate (LOPRESSOR) tablet 100 mg  100 mg Oral BID Tu, Ching T, DO   100 mg at 06/07/23 1009   pantoprazole (PROTONIX) EC tablet 40 mg  40 mg Oral BID Tu, Ching T, DO   40 mg at 06/07/23 1009   potassium chloride SA (KLOR-CON M) CR tablet 20 mEq  20 mEq Oral Q M,W,F Tu, Ching T, DO   20 mEq at 06/07/23 1008   sucralfate (CARAFATE) 1 GM/10ML suspension 1 g  1 g Oral BID Tu, Ching T, DO   1 g at 06/07/23 1007     Discharge Medications: Please see discharge summary for a list of discharge medications.  Relevant Imaging Results:  Relevant Lab Results:   Additional Information SS# [id number], [place]

## 2023-06-07 NOTE — TOC Initial Note (Addendum)
Transition of Care [facility]) - Initial/Assessment Note    Patient Details  Name: Sarah Wolf MRN: [id number] Date of Birth: 18-May-1935  Transition [facility]) CM/SW Contact:    Carley Hammed, LCSW Phone Number: 06/07/2023, 11:44 AM  Clinical Narrative:                  CSW noted pt is disoriented at this time and spoke with pt's nephew and HCPOA, Bud. HCPOA confirmed pt has been living independently but has recently been needing more help. HCPOA is agreeable to SNF nd states that pt was in [place] previously. Per HCPOA he lives closer to [place] in [facility] and this would be his preference. He is agreeable to a further fax out for options. Plan is for pt to return home if possible after SNF, but family is aware she may need additional care after SNF. Documentation completed, will provide bed offers and start auth this afternoon. TOC will continue to follow.   3:15 Bed offers left at bedside. CSW spoke with HCPOA. He states he will be in first thing in the morning and will advise CSW of choice then. Auth pending without facility. TOC will continue to follow.   Expected Discharge Plan: Skilled Nursing Facility Barriers to Discharge: Continued Medical Work up, SNF Pending bed offer, Insurance Authorization   Patient Goals and CMS Choice Patient states their goals for this hospitalization and ongoing recovery are:: Pt disoriented and unable to participate in goal setting. CMS Medicare.gov Compare Post Acute Care list provided to:: Patient Represenative (must comment) Choice offered to / list presented to : [facility] POA / Guardian (Bud, Nephew, HCPOA)      Expected Discharge Plan and Services In-house Referral: Clinical Social Work   Post Acute Care Choice: Skilled Nursing Facility Living arrangements for the past 2 months: Single Family Home                                      Prior Living Arrangements/Services Living arrangements for the past 2 months: Single Family Home Lives  with:: Self Patient language and need for interpreter reviewed:: Yes Do you feel safe going back to the place where you live?: Yes      Need for Family Participation in Patient Care: Yes (Comment) Care giver support system in place?: Yes (comment) Current home services: DME Criminal Activity/Legal Involvement Pertinent to Current Situation/Hospitalization: No - Comment as needed  Activities of Daily Living   ADL Screening (condition at time of admission) Independently performs ADLs?: No Does the patient have a NEW difficulty with bathing/dressing/toileting/self-feeding that is expected to last >3 days?: Yes (Initiates electronic notice to provider for possible OT consult) Does the patient have a NEW difficulty with getting in/out of bed, walking, or climbing stairs that is expected to last >3 days?: Yes (Initiates electronic notice to provider for possible PT consult) Does the patient have a NEW difficulty with communication that is expected to last >3 days?: No Is the patient deaf or have difficulty hearing?: Yes Does the patient have difficulty seeing, even when wearing glasses/contacts?: No Does the patient have difficulty concentrating, remembering, or making decisions?: Yes  Permission Sought/Granted Permission sought to share information with : Family Supports Permission granted to share information with : Yes, Verbal Permission Granted  Share Information with NAME: Vivia Ewing     Permission granted to share info w Relationship: Nephew, HCPOA  Emotional Assessment Appearance:: Appears stated age Attitude/Demeanor/Rapport: Unable to Assess Affect (typically observed): Unable to Assess Orientation: : Oriented to Self Alcohol / Substance Use: Not Applicable Psych Involvement: No (comment)  Admission diagnosis:  Hypokalemia [E87.6] Pericardial effusion [I31.39] Pleural effusion [J90] Cervical strain, acute, initial encounter [S16.1XXA] Acute head injury, initial encounter  [S09.90XA] Closed displaced fracture of second cervical vertebra, unspecified fracture morphology, initial encounter (HCC) [S12.100A] Patient Active Problem List   Diagnosis Date Noted   Pericardial effusion 06/05/2023   Hypokalemia 06/05/2023   Chronic hypoxic respiratory failure (HCC) 06/05/2023   Fall at home, initial encounter 06/05/2023   Pleural effusion 06/05/2023   Chronic combined systolic (congestive) and diastolic (congestive) heart failure (HCC) 06/05/2023   Abnormal TSH 06/05/2023   Dens fracture (HCC) 06/05/2023   History of dysphagia 06/05/2023   PAD (peripheral artery disease) (HCC) 04/20/2023   Pure hypercholesterolemia 04/20/2023   Porokeratosis 04/10/2023   Esophagitis 03/26/2023   Gastritis and gastroduodenitis 03/26/2023   Duodenal ulcer 03/26/2023   History of GI bleed 03/24/2023   Anticoagulated 03/24/2023   Aspiration into airway 03/17/2023   Acute respiratory failure with hypoxia (HCC) 03/15/2023   Goals of care, counseling/discussion 03/15/2023   Persistent atrial fibrillation (HCC) 03/13/2023   Critical limb ischemia of right lower extremity (HCC) 03/13/2023   Thrombus of right atrial appendage 03/13/2023   Myocardial injury due to Afib 03/13/2023   Heart failure with mildly reduced ejection fraction (HFmrEF, 41-49%) (HCC) 03/13/2023   History of DVT (deep vein thrombosis) 03/13/2023   Lesion of mediastinum 03/13/2023   Hyponatremia 03/13/2023   Pain due to onychomycosis of toenails of both feet 01/06/2023   Hip fracture (HCC) 03/08/2019   Closed comminuted intra-articular fracture of distal femur, right, initial encounter (HCC)    Closed displaced supracondylar fracture with intracondylar extension of lower end of left femur (HCC)    PCP:  Ivonne Andrew, NP Pharmacy:   [facility] [address] ([address]), [place] - [address] [place] ([place]) [place] [address] Phone: [phone] Fax: [phone]  [facility] [address] [place] [address] Phone: [phone] Fax: [phone]     Social Determinants of Health (SDOH) Social History: SDOH Screenings   Food Insecurity: Patient Unable To Answer (06/06/2023)  Housing: High Risk (06/06/2023)  Transportation Needs: Patient Unable To Answer (06/06/2023)  Utilities: Patient Unable To Answer (06/06/2023)  Tobacco Use: Low Risk  (06/05/2023)   SDOH Interventions:     Readmission Risk Interventions     No data to display

## 2023-06-07 NOTE — Plan of Care (Signed)
  Problem: Clinical Measurements: Goal: Ability to maintain clinical measurements within normal limits will improve Outcome: Progressing Goal: Will remain free from infection Outcome: Progressing Goal: Respiratory complications will improve Outcome: Progressing   Problem: Activity: Goal: Risk for activity intolerance will decrease Outcome: Progressing   Problem: Nutrition: Goal: Adequate nutrition will be maintained Outcome: Progressing   Problem: Pain Management: Goal: General experience of comfort will improve Outcome: Progressing   Problem: Safety: Goal: Ability to remain free from injury will improve Outcome: Progressing   Problem: Skin Integrity: Goal: Risk for impaired skin integrity will decrease Outcome: Progressing

## 2023-06-07 NOTE — Progress Notes (Signed)
Initial Nutrition Assessment  DOCUMENTATION CODES:   Not applicable  INTERVENTION:   - Provide Magic cup TID with meals, prefers chocolate/vanilla, each supplement provides 290 kcal and 9 grams of protein  - Provide MVI with minerals daily  - Recommend getting updated weight  NUTRITION DIAGNOSIS:   Increased nutrient needs related to post-op healing as evidenced by estimated needs.   GOAL:   Patient will meet greater than or equal to 90% of their needs   MONITOR:   PO intake, Weight trends, Supplement acceptance, Diet advancement, Labs  REASON FOR ASSESSMENT:   Malnutrition Screening Tool    ASSESSMENT:   88 y.o. female with PMH of CHF, HTN, A.fib on Eliquis, DVT, HTN, PAD s/p Rt SFA stent, chronic hypoxic respiratory failure though secondary to large hiatal hernia who presents with recurrent falls. Found to have type II dens fracture with fracture on the left extending to the left transverse foramen and pericardial/pleural effusion.  11/18: Dysphagia 3  11/19: Soft -> Dysphagia 3 -> Dysphagia 1   PTA pt states having good app and fair PO intake. She would eat 3 meals/day. Breakfast was usually oats and peanut butter. Lunch and dinner were a microwaveable meal. Observed breakfast and pt consumed 100%. Pt states having an appetite but gets full fast. Pt does not like Ensures or Mighty shakes, but is wiling to try Magic cup-prefers chocolate/vanilla.   Pt's weight has fluctuated within the last month due to fluid gain/loss. Pt states her usual body weight is around 136 lbs and reports no concerns for weight loss. No updated weight since 11/12, messaged RN for new weight.   Potassium has been low, but is being replenished M,W,F.  Admit weight: 63.5 kg  Current weight: ???  05/30/23 63.5 kg  05/25/23 64.4 kg  05/23/23 65.3 kg  05/18/23 64.9 kg  05/08/23 68 kg  04/26/23 67.6 kg  03/30/23 73.7 kg   Average Meal Intake: 11/20 100% intake x 1 recorded  meals  Nutritionally Relevant Medications: Scheduled Meds:  atorvastatin  40 mg Oral Daily   metoprolol tartrate  100 mg Oral BID   potassium chloride SA  20 mEq Oral Q M,W,F   Labs Reviewed: Potassium 3.4 Glucose ranges from 107-136 mg/dL over the last 24 hours HgbA1c 6.6 (03/15/2023)  NUTRITION - FOCUSED PHYSICAL EXAM:  Flowsheet Row Most Recent Value  Orbital Region No depletion  Upper Arm Region Mild depletion  Thoracic and Lumbar Region Unable to assess  Buccal Region No depletion  Temple Region Moderate depletion  Clavicle Bone Region Moderate depletion  Clavicle and Acromion Bone Region Mild depletion  Scapular Bone Region Unable to assess  Dorsal Hand Mild depletion  Patellar Region Mild depletion  Anterior Thigh Region Mild depletion  Posterior Calf Region Mild depletion  Edema (RD Assessment) None  Hair Reviewed  Eyes Reviewed  Mouth Reviewed  Skin Reviewed  [Bruises and spots on arms]  Nails Reviewed       Diet Order:   Diet Order             DIET - DYS 1 Room service appropriate? Yes with Assist; Fluid consistency: Thin  Diet effective now                   EDUCATION NEEDS:   Education needs have been addressed  Skin:  Skin Assessment: Skin Integrity Issues: Skin Integrity Issues:: Incisions Incisions: L + R hand  Last BM:  11/19  Height:   Ht Readings from Last  1 Encounters:  05/30/23 5\' 5"  (1.651 m)    Weight:   Wt Readings from Last 1 Encounters:  05/30/23 63.5 kg    Ideal Body Weight:  56.8 kg  BMI:  There is no height or weight on file to calculate BMI.  Estimated Nutritional Needs:   Kcal:  1700-1900 kcal  Protein:  95-115 gm  Fluid:  >1.7L   Elliot Dally, RD Registered Dietitian  See Amion for more information

## 2023-06-07 NOTE — Hospital Course (Signed)
71GE with h/o chronic combined CHF, HTN, afib on Eliquis with h/o DVT, HTN, PAD s/p R SFA stent, and chronic hypoxic respiratory failure due to large HH who presented on 11/18 with recurrent falls.  Imaging on presentation showed large pericardial effusion that has increased in size from prior; echo pending.  Also with Dens fracture; neurosurgery recommends outpatient follow up and C-collar.  Also with L pleural effusion, not enough fluid for thoracentesis per IR.

## 2023-06-07 NOTE — Plan of Care (Signed)
°  Problem: Education: °Goal: Knowledge of General Education information will improve °Description: Including pain rating scale, medication(s)/side effects and non-pharmacologic comfort measures °Outcome: Not Progressing °  °Problem: Health Behavior/Discharge Planning: °Goal: Ability to manage health-related needs will improve °Outcome: Not Progressing °  °Problem: Activity: °Goal: Risk for activity intolerance will decrease °Outcome: Not Progressing °  °

## 2023-06-07 NOTE — Progress Notes (Signed)
Speech Language Pathology Treatment: Dysphagia  Patient Details Name: Sarah Wolf MRN: [id number] DOB: 1934/10/13 Today's Date: 06/07/2023 Time: 4259-5638 SLP Time Calculation (min) (ACUTE ONLY): 15 min  Assessment / Plan / Recommendation Clinical Impression  Patient seen by SLP for skilled intervention focused on dysphagia management. OT present and had repositioned patient to be seated at edge of bed. Patient was awake with improved alertness and awareness on this date as compared to previous. She was orientated X3. SLP directly observed patient with thin liquid via straw and regular solid food (cracker). Patient without overt s/sx of aspiration with either consistency administered. Mastication and bolus manipulation adequate, timely, and complete with minimal residue. Patient reported at baseline, she was eating regular solid foods with a preference for softer consistencies. SLP recommending initiation of dys 3 (mechanical soft) and thin liquids at this time. Set-up assistance and intermittent supervision with meals. ST to continue to acutely follow for diet toleration monitoring.   HPI HPI: Patient is an 88 y.o. female with PMH: HF (EF 50-55% on 04/2023), HTN, persistent A.fib on Eliquis, hx of DVT (2020), HTN, PAD s/p Rt SFA stent, chronic hypoxic respiratory failure due to large hiatal hernia. She presnted to the hospital on 06/05/2023 from home after fall resulting in her hitting her head on the refridgerator. She has had a total of three falls in the past month. She lives alone and her nephew visits and helps with medication and finances. She is otherwise independent and still drives to grocery store.CT head demonstrated minimally displaced type II dens fracture with fracture on the left extending to the left transverse foramen. Neurosurgery consulted and c-collar recommended and f/u in one week.      SLP Plan  Continue with current plan of care      Recommendations for follow up therapy  are one component of a multi-disciplinary discharge planning process, led by the attending physician.  Recommendations may be updated based on patient status, additional functional criteria and insurance authorization.    Recommendations  Diet recommendations: Dysphagia 3 (mechanical soft);Thin liquid Liquids provided via: Straw;Cup Medication Administration: Whole meds with puree Supervision: Staff to assist with self feeding Compensations: Small sips/bites;Slow rate;Minimize environmental distractions Postural Changes and/or Swallow Maneuvers: Seated upright 90 degrees;Upright 30-60 min after meal                  Oral care BID;Staff/trained caregiver to provide oral care   Intermittent Supervision/Assistance Dysphagia, unspecified (R13.10)     Continue with current plan of care     Marline Backbone, Senaida Lange., Speech Therapy Student    06/07/2023, 4:24 PM

## 2023-06-07 NOTE — Progress Notes (Signed)
Progress Note   Patient: Sarah Wolf UKG:[id number] DOB: [birth year] DOA: 06/05/2023     0 DOS: the patient was seen and examined on 06/07/2023   Brief hospital course: 88yo with h/o chronic combined CHF, HTN, afib on Eliquis with h/o DVT, HTN, PAD s/p R SFA stent, and chronic hypoxic respiratory failure due to large HH who presented on 11/18 with recurrent falls.  Imaging on presentation showed large pericardial effusion that has increased in size from prior; echo pending.  Also with Dens fracture; neurosurgery recommends outpatient follow up and C-collar.  Also with L pleural effusion, not enough fluid for thoracentesis per IR.  Assessment and Plan:  Fall at home  Per nephew, she has been falling at home and needs placement Dens fracture with current fall He is unsure she will be willing to agree to placement  Pericardial effusion CT imaging showing large pericardial effusion that is probably increased from chest CT done a few months ago Last echocardiogram on 05/05/2023 showed trivial pericardial effusion.   Repeat echocardiogram ordered and showed small pericardial effusion She denies any chest pain or sob She does not appear to need further evaluation or intervention for this issue currently   Dens fracture  Minimally displaced type 3 dens fracture. On the left the fracture extends into the left transverse foramen. No vascular injury on CTA neck Neurosurgery was consulted and recommended follow-up outpatient in a week Continue c-collar. Pain control.  Therapy evaluations ordered and discussed with family at bedside, plan for snf when medically stable.    Left Pleural effusion Patient with history of combined systolic and diastolic heart failure  CT shows large left pleural effusion.  IR consulted for thoracentesis, but surprisingly there is only scant fluid not amenable for thoracentesis.  She is currently on 2L [place] O2   Persistent atrial fibrillation  Rate better controlled  with pain control.  Continue metoprolol Patient on amiodarone, which was discontinued earlier this month due to thyroid issues Restart Eliquis as thoracentesis couldn't be done   History of dysphagia Continue dysphagia 1 diet as per SLP recommendations    Abnormal TSH Patient with abnormal TSH on 11/6 thought potentially to be amiodarone induced.  Has discontinued amiodarone since 11/8. TSH elevated  at 55 but improved from 68.  Free t4 is low at 0.38.  Recommend checking thyroid panel in 4 to 6 weeks Thyroid US ordered but will cancel (can't be done with collar on) - recommend outpatient f/u    Chronic combined systolic (congestive) and diastolic (congestive) heart failure  05/05/2023- EF of 50-55% with global hypokinesis. LV diastolic indeterminate. Moderately elevated pulmonary artery systolic pressure. Small pericardial effusion. Trivial MV regurgitation. Mild to moderate TV regurgitation Hold lasix for now and monitor   Chronic hypoxic respiratory failure  Thought secondary to large hiatal hernia Currently remains on 2 L via nasal cannula.  Questions patient's compliance at home.   Pure hypercholesterolemia Continue statin   PAD (peripheral artery disease)  S/p right SFA stent Resume eliquis  Dementia Concern for hospital delirium vs neck pain vs from pain meds.  Prn haldol.  Delirium precautions Nephew reports some MCI at home, likely sundowning and delirium associated  DNR Reviewed on admission    Consultants: IR PT SLP  Procedures: None  Antibiotics: None  30 Day Unplanned Readmission Risk Score    Flowsheet Row ED to Hosp-Admission (Discharged) from 05/04/2023 in [facility]  30 Day Unplanned Readmission Risk Score (%) 17.33  Filed at 05/08/2023 1200       This score is the patient's risk of an unplanned readmission within 30 days of being discharged (0 -100%). The score is based on dignosis, age, lab data,  medications, orders, and past utilization.   Low:  0-14.9   Medium: 15-21.9   High: 22-29.9   Extreme: 30 and above           Subjective: Reports that she is doing ok with the C-collar.  No specific concerns.  Not inclined to go to SNF.   Objective: Vitals:   06/07/23 0717 06/07/23 1653  BP: (!) 160/87 (!) 140/98  Pulse: 75 87  Resp: 16 18  Temp: (!) 97.3 F (36.3 C) 97.6 F (36.4 C)  SpO2: 100% 100%    Intake/Output Summary (Last 24 hours) at 06/07/2023 1804 Last data filed at 06/07/2023 1500 Gross per 24 hour  Intake 360 ml  Output 300 ml  Net 60 ml   There were no vitals filed for this visit.  Exam:  General:  Appears calm and comfortable and is in NAD, in hard [place] J collar Eyes:   EOMI, normal lids, iris ENT:  grossly normal hearing, lips & tongue, mmm; edentulous Neck:  hard C-collar in place Cardiovascular:  RRR, no m/r/g. No LE edema.  Respiratory:   CTA bilaterally with no wheezes/rales/rhonchi.  Normal respiratory effort. Abdomen:  soft, NT, ND Skin:  skin tears and contusions from fall(s) Musculoskeletal:   no bony abnormality Psychiatric:  grossly normal mood and affect, speech fluent and appropriate, AOx3 Neurologic:  CN 2-12 grossly intact, moves all extremities in coordinated fashion  Data Reviewed: I have reviewed the patient's lab results since admission.  Pertinent labs for today include:   K+ 3.4 Glucose 107 BUN 20/Creatinine 0.97/GFR 57 Stable CBC     Family Communication: Nephew was present throughout evaluation  Disposition: Status is: Observation The patient remains OBS appropriate and will d/c before 2 midnights.     Time spent: 50 minutes  Unresulted Labs (From admission, onward)     Start     Ordered   06/08/23 0500  Magnesium  Tomorrow morning,   R        06/07/23 1509   Unscheduled  CBC with Differential/Platelet  Tomorrow morning,   R        06/07/23 1804   Unscheduled  Basic metabolic panel  Tomorrow morning,    R        06/07/23 1804   Signed and Held  Urinalysis, w/ Reflex to Culture (Infection Suspected) -Urine, Clean Catch  (Urine Labs)  Once,   R       Question:  Specimen Source  Answer:  Urine, Clean Catch   Signed and Held             Author: Jonah Blue, MD 06/07/2023 6:04 PM  For on call review [email or web address].

## 2023-06-08 ENCOUNTER — Telehealth: Payer: Self-pay

## 2023-06-08 DIAGNOSIS — I3139 Other pericardial effusion (noninflammatory): Secondary | ICD-10-CM | POA: Diagnosis not present

## 2023-06-08 DIAGNOSIS — J9601 Acute respiratory failure with hypoxia: Secondary | ICD-10-CM | POA: Diagnosis not present

## 2023-06-08 LAB — CBC WITH DIFFERENTIAL/PLATELET
Abs Immature Granulocytes: 0.03 10*3/uL (ref 0.00–0.07)
Basophils Absolute: 0 10*3/uL (ref 0.0–0.1)
Basophils Relative: 0 %
Eosinophils Absolute: 0.1 10*3/uL (ref 0.0–0.5)
Eosinophils Relative: 2 %
HCT: 32 % — ABNORMAL LOW (ref 36.0–46.0)
Hemoglobin: 10 g/dL — ABNORMAL LOW (ref 12.0–15.0)
Immature Granulocytes: 0 %
Lymphocytes Relative: 13 %
Lymphs Abs: 1 10*3/uL (ref 0.7–4.0)
MCH: 28.5 pg (ref 26.0–34.0)
MCHC: 31.3 g/dL (ref 30.0–36.0)
MCV: 91.2 fL (ref 80.0–100.0)
Monocytes Absolute: 0.6 10*3/uL (ref 0.1–1.0)
Monocytes Relative: 8 %
Neutro Abs: 5.8 10*3/uL (ref 1.7–7.7)
Neutrophils Relative %: 77 %
Platelets: 221 10*3/uL (ref 150–400)
RBC: 3.51 MIL/uL — ABNORMAL LOW (ref 3.87–5.11)
RDW: 17.8 % — ABNORMAL HIGH (ref 11.5–15.5)
WBC: 7.6 10*3/uL (ref 4.0–10.5)
nRBC: 0 % (ref 0.0–0.2)

## 2023-06-08 LAB — BASIC METABOLIC PANEL
Anion gap: 9 (ref 5–15)
BUN: 27 mg/dL — ABNORMAL HIGH (ref 8–23)
CO2: 30 mmol/L (ref 22–32)
Calcium: 9 mg/dL (ref 8.9–10.3)
Chloride: 97 mmol/L — ABNORMAL LOW (ref 98–111)
Creatinine, Ser: 1.43 mg/dL — ABNORMAL HIGH (ref 0.44–1.00)
GFR, Estimated: 36 mL/min — ABNORMAL LOW (ref >60)
Glucose, Bld: 121 mg/dL — ABNORMAL HIGH (ref 70–99)
Potassium: 4 mmol/L (ref 3.5–5.1)
Sodium: 136 mmol/L (ref 135–145)

## 2023-06-08 LAB — MAGNESIUM: Magnesium: 2.2 mg/dL (ref 1.7–2.4)

## 2023-06-08 MED ORDER — LACTATED RINGERS IV BOLUS
250.0000 mL | Freq: Once | INTRAVENOUS | Status: AC
  Administered 2023-06-08: 250 mL via INTRAVENOUS

## 2023-06-08 MED ORDER — TUBERCULIN PPD 5 UNIT/0.1ML ID SOLN
5.0000 [IU] | Freq: Once | INTRADERMAL | Status: DC
  Filled 2023-06-08: qty 0.1

## 2023-06-08 NOTE — Progress Notes (Signed)
Speech Language Pathology Treatment: Dysphagia  Patient Details Name: KELLY-ANN EVELETH MRN: [id number] DOB: January 04, 1935 Today's Date: 06/08/2023 Time: 7829-5621 SLP Time Calculation (min) (ACUTE ONLY): 43 min  Assessment / Plan / Recommendation Clinical Impression  Patient seen by SLP for skilled intervention focused on dysphagia management. Patient was upright in bed with family friend present when SLP entered the room. She was pleasant and cooperative throughout the session. She had recently finished eating her lunch, of which was about 50% completed. Patient stated that she has been tolerating dys 3 (mechanical soft) consistency well and has had a good appetite. She reported that she would prefer to remain on dys 3 diet at this time. Patient is clinically safe to advance to regular diet and may be advanced at her request without ST consult. Her vocal quality continues to be dysphonic, which patient noted has waxed and waned ever since she underwent intubation last month. Session was then focused on patient education related to dysphagia and her level of support necessary at discharge. Patient verbalized understanding. Patient with no further ST needs at this time.   HPI HPI: Patient is an 88 y.o. female with PMH: HF (EF 50-55% on 04/2023), HTN, persistent A.fib on Eliquis, hx of DVT (2020), HTN, PAD s/p Rt SFA stent, chronic hypoxic respiratory failure due to large hiatal hernia. She presnted to the hospital on 06/05/2023 from home after fall resulting in her hitting her head on the refridgerator. She has had a total of three falls in the past month. She lives alone and her nephew visits and helps with medication and finances. She is otherwise independent and still drives to grocery store.CT head demonstrated minimally displaced type II dens fracture with fracture on the left extending to the left transverse foramen. Neurosurgery consulted and c-collar recommended and f/u in one week.      SLP Plan   Discharge SLP treatment due to (comment);All goals met      Recommendations for follow up therapy are one component of a multi-disciplinary discharge planning process, led by the attending physician.  Recommendations may be updated based on patient status, additional functional criteria and insurance authorization.    Recommendations  Diet recommendations: Dysphagia 3 (mechanical soft);Thin liquid Liquids provided via: Cup;Straw Medication Administration: Whole meds with puree Supervision: Staff to assist with self feeding Compensations: Small sips/bites;Slow rate;Minimize environmental distractions Postural Changes and/or Swallow Maneuvers: Seated upright 90 degrees                  Oral care BID;Staff/trained caregiver to provide oral care   Intermittent Supervision/Assistance Dysphagia, unspecified (R13.10)     Discharge SLP treatment due to (comment);All goals met     Marline Backbone, Senaida Lange., Speech Therapy Student    06/08/2023, 3:57 PM

## 2023-06-08 NOTE — TOC Progression Note (Signed)
Transition of Care [facility]) - Progression Note    Patient Details  Name: Sarah Wolf MRN: [id number] Date of Birth: 1935/05/16  Transition of Care [facility]) CM/SW Contact  Carley Hammed, LCSW Phone Number: 06/08/2023, 12:57 PM  Clinical Narrative:    CSW advised that family is now considering [facility] instead of SNF. CSW spoke with [facility]CP[facility] Bud who noted that his daughter, Aundra Millet would be the best contact moving forward. CSW spoke with Aundra Millet who is charge in the ED today. Aundra Millet would like to skip SNF and admit directly to ALF. CSW completed ALF and requested TB test, this was ordered. Spoke with MD who states either is a good option depending on what she gets approved for. CSW spoke with [place] at [facility] and provided requested documentation. An RN will be out this afternoon to do an assessment to determine if pt is a candidate. PT/OT provider, [facility], is running pt's insurance for benefits and copays. TOC will continue to follow for DC needs.   Expected Discharge Plan: Skilled Nursing Facility Barriers to Discharge: Continued Medical Work up, SNF Pending bed offer, English as a second language teacher  Expected Discharge Plan and Services In-house Referral: Clinical Social Work   Post Acute Care Choice: Skilled Nursing Facility Living arrangements for the past 2 months: Single Family Home                                       Social Determinants of Health (SDOH) Interventions SDOH Screenings   Food Insecurity: Patient Unable To Answer (06/06/2023)  Housing: High Risk (06/06/2023)  Transportation Needs: Patient Unable To Answer (06/06/2023)  Utilities: Patient Unable To Answer (06/06/2023)  Tobacco Use: Low Risk  (06/05/2023)    Readmission Risk Interventions     No data to display

## 2023-06-08 NOTE — NC FL2 (Signed)
[place] MEDICAID FL2 LEVEL OF CARE FORM     IDENTIFICATION  Patient Name: Sarah Wolf Birthdate: Oct 15, 1934 Sex: female Admission Date (Current Location): 06/05/2023  [facility] and [place] Number:  Producer, television/film/video and Address:  [facility], [address] [place], [place] [address]      Provider Number: [id number]  Attending Physician Name and Address:  Jonah Blue, MD  Relative Name and Phone Number:       Current Level of Care: Hospital Recommended Level of Care: Assisted Living Facility Prior Approval Number:    Date Approved/Denied:   PASRR Number: [id number]  Discharge Plan: Other (Comment) (ALF)    Current Diagnoses: Patient Active Problem List   Diagnosis Date Noted   Pericardial effusion 06/05/2023   Hypokalemia 06/05/2023   Chronic hypoxic respiratory failure (HCC) 06/05/2023   Fall at home, initial encounter 06/05/2023   Pleural effusion 06/05/2023   Chronic combined systolic (congestive) and diastolic (congestive) heart failure (HCC) 06/05/2023   Abnormal TSH 06/05/2023   Dens fracture (HCC) 06/05/2023   History of dysphagia 06/05/2023   PAD (peripheral artery disease) (HCC) 04/20/2023   Pure hypercholesterolemia 04/20/2023   Porokeratosis 04/10/2023   Esophagitis 03/26/2023   Gastritis and gastroduodenitis 03/26/2023   Duodenal ulcer 03/26/2023   History of GI bleed 03/24/2023   Anticoagulated 03/24/2023   Aspiration into airway 03/17/2023   Acute respiratory failure with hypoxia (HCC) 03/15/2023   Goals of care, counseling/discussion 03/15/2023   Persistent atrial fibrillation (HCC) 03/13/2023   Critical limb ischemia of right lower extremity (HCC) 03/13/2023   Thrombus of right atrial appendage 03/13/2023   Myocardial injury due to Afib 03/13/2023   Heart failure with mildly reduced ejection fraction (HFmrEF, 41-49%) (HCC) 03/13/2023   History of DVT (deep vein thrombosis) 03/13/2023   Lesion of mediastinum  03/13/2023   Hyponatremia 03/13/2023   Pain due to onychomycosis of toenails of both feet 01/06/2023   Hip fracture (HCC) 03/08/2019   Closed comminuted intra-articular fracture of distal femur, right, initial encounter (HCC)    Closed displaced supracondylar fracture with intracondylar extension of lower end of left femur (HCC)     Orientation RESPIRATION BLADDER Height & Weight     Self, Time, Situation, Place  O2 ([place] 2L) Continent Weight:   Height:     BEHAVIORAL SYMPTOMS/MOOD NEUROLOGICAL BOWEL NUTRITION STATUS      Incontinent Diet (Dysphagia 3, Thin liquids)  AMBULATORY STATUS COMMUNICATION OF NEEDS Skin   Limited Assist Verbally Skin abrasions (Bilateral hand skin tears)                       Personal Care Assistance Level of Assistance  Bathing, Feeding, Dressing Bathing Assistance: Limited assistance Feeding assistance: Independent Dressing Assistance: Limited assistance     Functional Limitations Info  Sight, Hearing, Speech Sight Info: Adequate Hearing Info: Adequate Speech Info: Adequate    SPECIAL CARE FACTORS FREQUENCY  PT (By licensed PT), OT (By licensed OT)     PT Frequency: 5x week OT Frequency: 5x week            Contractures Contractures Info: Not present    Additional Factors Info  Code Status, Allergies Code Status Info: DNR Allergies Info: Penicillin G           Current Medications (06/08/2023):  This is the current hospital active medication list Current Facility-Administered Medications  Medication Dose Route Frequency Provider Last Rate Last Admin   acetaminophen (TYLENOL) tablet  650 mg  650 mg Oral Q6H PRN Tu, Ching T, DO       apixaban (ELIQUIS) tablet 5 mg  5 mg Oral BID Kathlen Mody, MD   5 mg at 06/08/23 1002   atorvastatin (LIPITOR) tablet 40 mg  40 mg Oral Daily Tu, Ching T, DO   40 mg at 06/08/23 1001   haloperidol lactate (HALDOL) injection 2-5 mg  2-5 mg Intravenous Q6H PRN Jonah Blue, MD        HYDROcodone-acetaminophen (NORCO/VICODIN) 5-325 MG per tablet 1 tablet  1 tablet Oral Q6H PRN Tu, Ching T, DO   1 tablet at 06/06/23 1554   metoprolol tartrate (LOPRESSOR) tablet 100 mg  100 mg Oral BID Tu, Ching T, DO   100 mg at 06/08/23 1002   multivitamin with minerals tablet 1 tablet  1 tablet Oral Daily Jonah Blue, MD   1 tablet at 06/08/23 1003   pantoprazole (PROTONIX) EC tablet 40 mg  40 mg Oral BID Tu, Ching T, DO   40 mg at 06/08/23 1002   potassium chloride SA (KLOR-CON M) CR tablet 20 mEq  20 mEq Oral Q M,W,F Tu, Ching T, DO   20 mEq at 06/07/23 1008   sucralfate (CARAFATE) 1 GM/10ML suspension 1 g  1 g Oral BID Tu, Ching T, DO   1 g at 06/08/23 1003     Discharge Medications: Please see discharge summary for a list of discharge medications. TBD Relevant Imaging Results:  Relevant Lab Results:   Additional Information SS# [id number], [place]

## 2023-06-08 NOTE — Plan of Care (Signed)
  Problem: Clinical Measurements: Goal: Ability to maintain clinical measurements within normal limits will improve Outcome: Progressing Goal: Will remain free from infection Outcome: Progressing Goal: Respiratory complications will improve Outcome: Progressing   Problem: Nutrition: Goal: Adequate nutrition will be maintained Outcome: Progressing   Problem: Elimination: Goal: Will not experience complications related to bowel motility Outcome: Progressing Goal: Will not experience complications related to urinary retention Outcome: Progressing

## 2023-06-08 NOTE — Plan of Care (Signed)
  Problem: Education: Goal: Knowledge of General Education information will improve Description: Including pain rating scale, medication(s)/side effects and non-pharmacologic comfort measures Outcome: Progressing   Problem: Pain Management: Goal: General experience of comfort will improve Outcome: Progressing   Problem: Activity: Goal: Risk for activity intolerance will decrease Outcome: Not Progressing

## 2023-06-08 NOTE — Telephone Encounter (Signed)
Sarah Wolf from [facility] advised pt had fell and was sent to the hospital.  She was admitted to the hospital 06/05/23. No discharge date.   KH

## 2023-06-08 NOTE — Progress Notes (Signed)
Progress Note   Patient: Sarah Wolf NWG:[id number] DOB: August 07, 1934 DOA: 06/05/2023     0 DOS: the patient was seen and examined on 06/08/2023   Brief hospital course: 88yo with h/o chronic combined CHF, HTN, afib on Eliquis with h/o DVT, HTN, PAD s/p R SFA stent, and chronic hypoxic respiratory failure due to large HH who presented on 11/18 with recurrent falls.  Imaging on presentation showed large pericardial effusion that has increased in size from prior; echo pending.  Also with Dens fracture; neurosurgery recommends outpatient follow up and C-collar.  Also with L pleural effusion, not enough fluid for thoracentesis per IR.  Assessment and Plan:  Fall at home  Per nephew, she has been falling at home and needs placement Dens fracture with current fall She is willing to agree to placement at this time Depending on acuity of needs, she may either go to SNF rehab and then likely ALF long-term placement or she may go directly to ALF at [facility] with PT/OT there Quantiferon gold ordered   Pericardial effusion CT imaging showing large pericardial effusion that is probably increased from chest CT done a few months ago Last echocardiogram on 05/05/2023 showed trivial pericardial effusion.   Repeat echocardiogram ordered and showed small pericardial effusion She denies any chest pain or sob She does not appear to need further evaluation or intervention for this issue currently   Dens fracture  Minimally displaced type 3 dens fracture. On the left the fracture extends into the left transverse foramen. No vascular injury on CTA neck Neurosurgery was consulted and recommended follow-up outpatient in a week Continue c-collar. Pain control.  Therapy evaluations ordered and discussed with family at bedside, plan for snf vs. ALF when bed is available   Left Pleural effusion Patient with history of combined systolic and diastolic heart failure  CT shows large left pleural effusion.  IR  consulted for thoracentesis, but surprisingly there is only scant fluid not amenable for thoracentesis.  She is currently on 2L [place] O2 (she wears nocturnal O2 at home)  Stage 3a CKD Slightly worse than baseline today Encourage PO intake Hold Lasix Will give 250 cc bolus Recheck in AM   Persistent atrial fibrillation  Rate better controlled with pain control, tachycardia has resolved Continue metoprolol Patient on amiodarone, which was discontinued earlier this month due to thyroid issues Restart Eliquis as thoracentesis couldn't be done   History of dysphagia Continue dysphagia 1 diet as per SLP recommendations    Abnormal TSH Patient with abnormal TSH on 11/6 thought potentially to be amiodarone induced.  Has discontinued amiodarone since 11/8 TSH elevated  at 55 but improved from 68.  Free t4 is low at 0.38.  Recommend checking thyroid panel in 4 to 6 weeks Thyroid US ordered but will cancel (can't be done with collar on) - recommend outpatient f/u    Chronic combined systolic (congestive) and diastolic (congestive) heart failure  05/05/2023- EF of 50-55% with global hypokinesis. LV diastolic indeterminate. Moderately elevated pulmonary artery systolic pressure. Small pericardial effusion. Trivial MV regurgitation. Mild to moderate TV regurgitation Hold lasix for now and monitor   Chronic hypoxic respiratory failure  Thought secondary to large hiatal hernia Currently remains on 2 L via nasal cannula Reports only wearing nocturnal O2 at home, ?compliance   Pure hypercholesterolemia Continue statin   PAD (peripheral artery disease)  S/p right SFA stent Resume eliquis   Dementia Concern for hospital delirium vs neck pain vs from pain meds - this is  significantly improved Prn haldol Delirium precautions Nephew reports some MCI at home, likely sundowning and delirium associated   DNR Reviewed on admission      Consultants: IR PT SLP   Procedures: None    Antibiotics: None  30 Day Unplanned Readmission Risk Score    Flowsheet Row ED to Hosp-Admission (Discharged) from 05/04/2023 in [facility]  30 Day Unplanned Readmission Risk Score (%) 17.33 Filed at 05/08/2023 1200       This score is the patient's risk of an unplanned readmission within 30 days of being discharged (0 -100%). The score is based on dignosis, age, lab data, medications, orders, and past utilization.   Low:  0-14.9   Medium: 15-21.9   High: 22-29.9   Extreme: 30 and above           Subjective: Sitting up in the chair, feeling much better today other than headache   Objective: Vitals:   06/08/23 0426 06/08/23 0831  BP: (!) 168/93 (!) 166/101  Pulse: 81 69  Resp: 17 19  Temp: (!) 97.5 F (36.4 C) 97.6 F (36.4 C)  SpO2: 100% 99%    Intake/Output Summary (Last 24 hours) at 06/08/2023 1405 Last data filed at 06/08/2023 1000 Gross per 24 hour  Intake 350 ml  Output 750 ml  Net -400 ml     Exam:  General:  Appears calm and comfortable and is in NAD, no obvious scalp issues Eyes:   EOMI, normal lids, iris ENT:  grossly normal hearing, lips & tongue, mmm Neck: hard C collar in place Cardiovascular:  RRR, no m/r/g. No LE edema.  Respiratory:   CTA bilaterally with no wheezes/rales/rhonchi.  Normal respiratory effort. Abdomen:  soft, NT, ND Skin:  no rash or induration seen on limited exam Musculoskeletal:  no bony abnormality Psychiatric:  pleasant mood and affect, speech fluent and appropriate, AOx3 Neurologic:  CN 2-12 grossly intact, moves all extremities in coordinated fashion  Data Reviewed: I have reviewed the patient's lab results since admission.  Pertinent labs for today include:  Glucose 121 BUN 27/Creatinine 1.43/GFR 36; baseline 1.1, GFR 46 WBC 7.6 Hgb 10 Quantiferon gold pending     Family Communication: I spoke at the bedside with her nephew and several times with her niece, Tommi Rumps, [facility] Charge Nurse  Disposition: Status is: Observation The patient remains OBS appropriate and will d/c before 2 midnights.     Time spent: 50 minutes  Unresulted Labs (From admission, onward)     Start     Ordered   06/09/23 0500  Basic metabolic panel  Tomorrow morning,   R        06/08/23 1404   06/09/23 0500  CBC with Differential/Platelet  Tomorrow morning,   R        06/08/23 1404   06/08/23 0901  QuantiFERON-TB Gold Plus  Once,   R        06/08/23 0900   Signed and Held  Urinalysis, w/ Reflex to Culture (Infection Suspected) -Urine, Clean Catch  (Urine Labs)  Once,   R       Question:  Specimen Source  Answer:  Urine, Clean Catch   Signed and Held             Author: Jonah Blue, MD 06/08/2023 2:05 PM  For on call review [email or web address].

## 2023-06-09 DIAGNOSIS — M47812 Spondylosis without myelopathy or radiculopathy, cervical region: Secondary | ICD-10-CM | POA: Diagnosis not present

## 2023-06-09 DIAGNOSIS — I10 Essential (primary) hypertension: Secondary | ICD-10-CM | POA: Diagnosis not present

## 2023-06-09 DIAGNOSIS — Z7401 Bed confinement status: Secondary | ICD-10-CM | POA: Diagnosis not present

## 2023-06-09 DIAGNOSIS — F0390 Unspecified dementia without behavioral disturbance: Secondary | ICD-10-CM | POA: Diagnosis not present

## 2023-06-09 DIAGNOSIS — N1831 Chronic kidney disease, stage 3a: Secondary | ICD-10-CM | POA: Diagnosis not present

## 2023-06-09 DIAGNOSIS — I70223 Atherosclerosis of native arteries of extremities with rest pain, bilateral legs: Secondary | ICD-10-CM | POA: Diagnosis not present

## 2023-06-09 DIAGNOSIS — Z8719 Personal history of other diseases of the digestive system: Secondary | ICD-10-CM | POA: Diagnosis not present

## 2023-06-09 DIAGNOSIS — Z86718 Personal history of other venous thrombosis and embolism: Secondary | ICD-10-CM | POA: Diagnosis not present

## 2023-06-09 DIAGNOSIS — S12110D Anterior displaced Type II dens fracture, subsequent encounter for fracture with routine healing: Secondary | ICD-10-CM | POA: Diagnosis not present

## 2023-06-09 DIAGNOSIS — R410 Disorientation, unspecified: Secondary | ICD-10-CM | POA: Diagnosis not present

## 2023-06-09 DIAGNOSIS — E7800 Pure hypercholesterolemia, unspecified: Secondary | ICD-10-CM | POA: Diagnosis not present

## 2023-06-09 DIAGNOSIS — E876 Hypokalemia: Secondary | ICD-10-CM | POA: Diagnosis not present

## 2023-06-09 DIAGNOSIS — R1310 Dysphagia, unspecified: Secondary | ICD-10-CM | POA: Diagnosis not present

## 2023-06-09 DIAGNOSIS — I5042 Chronic combined systolic (congestive) and diastolic (congestive) heart failure: Secondary | ICD-10-CM | POA: Diagnosis not present

## 2023-06-09 DIAGNOSIS — M6281 Muscle weakness (generalized): Secondary | ICD-10-CM | POA: Diagnosis not present

## 2023-06-09 DIAGNOSIS — B9689 Other specified bacterial agents as the cause of diseases classified elsewhere: Secondary | ICD-10-CM | POA: Diagnosis not present

## 2023-06-09 DIAGNOSIS — R1312 Dysphagia, oropharyngeal phase: Secondary | ICD-10-CM | POA: Diagnosis not present

## 2023-06-09 DIAGNOSIS — R531 Weakness: Secondary | ICD-10-CM | POA: Diagnosis not present

## 2023-06-09 DIAGNOSIS — R2681 Unsteadiness on feet: Secondary | ICD-10-CM | POA: Diagnosis not present

## 2023-06-09 DIAGNOSIS — S0990XA Unspecified injury of head, initial encounter: Secondary | ICD-10-CM | POA: Diagnosis present

## 2023-06-09 DIAGNOSIS — N390 Urinary tract infection, site not specified: Secondary | ICD-10-CM | POA: Diagnosis not present

## 2023-06-09 DIAGNOSIS — R488 Other symbolic dysfunctions: Secondary | ICD-10-CM | POA: Diagnosis not present

## 2023-06-09 DIAGNOSIS — R41841 Cognitive communication deficit: Secondary | ICD-10-CM | POA: Diagnosis not present

## 2023-06-09 DIAGNOSIS — Z79899 Other long term (current) drug therapy: Secondary | ICD-10-CM | POA: Diagnosis not present

## 2023-06-09 DIAGNOSIS — M6259 Muscle wasting and atrophy, not elsewhere classified, multiple sites: Secondary | ICD-10-CM | POA: Diagnosis not present

## 2023-06-09 DIAGNOSIS — I3139 Other pericardial effusion (noninflammatory): Secondary | ICD-10-CM | POA: Diagnosis not present

## 2023-06-09 DIAGNOSIS — J9611 Chronic respiratory failure with hypoxia: Secondary | ICD-10-CM | POA: Diagnosis not present

## 2023-06-09 DIAGNOSIS — R262 Difficulty in walking, not elsewhere classified: Secondary | ICD-10-CM | POA: Diagnosis not present

## 2023-06-09 DIAGNOSIS — I739 Peripheral vascular disease, unspecified: Secondary | ICD-10-CM | POA: Diagnosis not present

## 2023-06-09 DIAGNOSIS — S12101D Unspecified nondisplaced fracture of second cervical vertebra, subsequent encounter for fracture with routine healing: Secondary | ICD-10-CM | POA: Diagnosis not present

## 2023-06-09 DIAGNOSIS — J90 Pleural effusion, not elsewhere classified: Secondary | ICD-10-CM | POA: Diagnosis not present

## 2023-06-09 DIAGNOSIS — S12110A Anterior displaced Type II dens fracture, initial encounter for closed fracture: Secondary | ICD-10-CM | POA: Diagnosis not present

## 2023-06-09 DIAGNOSIS — R278 Other lack of coordination: Secondary | ICD-10-CM | POA: Diagnosis not present

## 2023-06-09 DIAGNOSIS — I4811 Longstanding persistent atrial fibrillation: Secondary | ICD-10-CM | POA: Diagnosis not present

## 2023-06-09 DIAGNOSIS — I130 Hypertensive heart and chronic kidney disease with heart failure and stage 1 through stage 4 chronic kidney disease, or unspecified chronic kidney disease: Secondary | ICD-10-CM | POA: Diagnosis not present

## 2023-06-09 DIAGNOSIS — D649 Anemia, unspecified: Secondary | ICD-10-CM | POA: Diagnosis not present

## 2023-06-09 DIAGNOSIS — S0083XA Contusion of other part of head, initial encounter: Secondary | ICD-10-CM | POA: Diagnosis not present

## 2023-06-09 DIAGNOSIS — Z7901 Long term (current) use of anticoagulants: Secondary | ICD-10-CM | POA: Diagnosis not present

## 2023-06-09 DIAGNOSIS — R296 Repeated falls: Secondary | ICD-10-CM | POA: Diagnosis not present

## 2023-06-09 DIAGNOSIS — R7309 Other abnormal glucose: Secondary | ICD-10-CM | POA: Diagnosis not present

## 2023-06-09 DIAGNOSIS — Y92129 Unspecified place in nursing home as the place of occurrence of the external cause: Secondary | ICD-10-CM | POA: Diagnosis not present

## 2023-06-09 DIAGNOSIS — I5040 Unspecified combined systolic (congestive) and diastolic (congestive) heart failure: Secondary | ICD-10-CM | POA: Diagnosis not present

## 2023-06-09 DIAGNOSIS — Z66 Do not resuscitate: Secondary | ICD-10-CM | POA: Diagnosis not present

## 2023-06-09 DIAGNOSIS — I110 Hypertensive heart disease with heart failure: Secondary | ICD-10-CM | POA: Diagnosis not present

## 2023-06-09 DIAGNOSIS — S12112A Nondisplaced Type II dens fracture, initial encounter for closed fracture: Secondary | ICD-10-CM | POA: Diagnosis not present

## 2023-06-09 DIAGNOSIS — I4891 Unspecified atrial fibrillation: Secondary | ICD-10-CM | POA: Diagnosis not present

## 2023-06-09 DIAGNOSIS — R946 Abnormal results of thyroid function studies: Secondary | ICD-10-CM | POA: Diagnosis not present

## 2023-06-09 DIAGNOSIS — M4312 Spondylolisthesis, cervical region: Secondary | ICD-10-CM | POA: Diagnosis not present

## 2023-06-09 DIAGNOSIS — W19XXXA Unspecified fall, initial encounter: Secondary | ICD-10-CM | POA: Diagnosis not present

## 2023-06-09 LAB — CBC WITH DIFFERENTIAL/PLATELET
Abs Immature Granulocytes: 0.02 10*3/uL (ref 0.00–0.07)
Basophils Absolute: 0 10*3/uL (ref 0.0–0.1)
Basophils Relative: 0 %
Eosinophils Absolute: 0.1 10*3/uL (ref 0.0–0.5)
Eosinophils Relative: 2 %
HCT: 31.5 % — ABNORMAL LOW (ref 36.0–46.0)
Hemoglobin: 9.8 g/dL — ABNORMAL LOW (ref 12.0–15.0)
Immature Granulocytes: 0 %
Lymphocytes Relative: 15 %
Lymphs Abs: 1 10*3/uL (ref 0.7–4.0)
MCH: 28.8 pg (ref 26.0–34.0)
MCHC: 31.1 g/dL (ref 30.0–36.0)
MCV: 92.6 fL (ref 80.0–100.0)
Monocytes Absolute: 0.5 10*3/uL (ref 0.1–1.0)
Monocytes Relative: 8 %
Neutro Abs: 4.9 10*3/uL (ref 1.7–7.7)
Neutrophils Relative %: 75 %
Platelets: 206 10*3/uL (ref 150–400)
RBC: 3.4 MIL/uL — ABNORMAL LOW (ref 3.87–5.11)
RDW: 17.8 % — ABNORMAL HIGH (ref 11.5–15.5)
WBC: 6.5 10*3/uL (ref 4.0–10.5)
nRBC: 0 % (ref 0.0–0.2)

## 2023-06-09 LAB — BASIC METABOLIC PANEL
Anion gap: 6 (ref 5–15)
BUN: 22 mg/dL (ref 8–23)
CO2: 31 mmol/L (ref 22–32)
Calcium: 8.8 mg/dL — ABNORMAL LOW (ref 8.9–10.3)
Chloride: 97 mmol/L — ABNORMAL LOW (ref 98–111)
Creatinine, Ser: 0.88 mg/dL (ref 0.44–1.00)
GFR, Estimated: >60 mL/min (ref >60)
Glucose, Bld: 110 mg/dL — ABNORMAL HIGH (ref 70–99)
Potassium: 3.7 mmol/L (ref 3.5–5.1)
Sodium: 134 mmol/L — ABNORMAL LOW (ref 135–145)

## 2023-06-09 MED ORDER — HYDROCODONE-ACETAMINOPHEN 5-325 MG PO TABS: 1.0000 | ORAL_TABLET | Freq: Four times a day (QID) | ORAL | 0 refills | Status: DC | PRN

## 2023-06-09 MED ORDER — ADULT MULTIVITAMIN W/MINERALS CH: 1.0000 | ORAL_TABLET | Freq: Every day | ORAL | Status: DC

## 2023-06-09 NOTE — Discharge Summary (Signed)
Physician Discharge Summary   Patient: Sarah Wolf MRN: [id number] DOB: 03/16/1935  Admit date:     06/05/2023  Discharge date: 06/09/23  Discharge Physician: Jonah Blue   PCP: Ivonne Andrew, NP   Recommendations at discharge:   You are being discharged to [facility] for rehab [facility] hard C-collar until follow up with neurosurgery Continue dysphagia 1 diet for now Will need outpatient f/u of thyroid testing and thyroid ultrasound Follow up with NP Tanda Rockers upon discharge from rehab Follow up with neurosurgery in 1 week, call for appointment Follow up with cardiology; they should call with an appointment  Discharge Diagnoses: Principal Problem:   Pericardial effusion Active Problems:   Pleural effusion   Dens fracture (HCC)   Persistent atrial fibrillation (HCC)   PAD (peripheral artery disease) (HCC)   Pure hypercholesterolemia   Hypokalemia   Chronic hypoxic respiratory failure (HCC)   Fall at home, initial encounter   Chronic combined systolic (congestive) and diastolic (congestive) heart failure (HCC)   Abnormal TSH   History of dysphagia   Hospital Course: [phone] with h/o chronic combined CHF, HTN, afib on Eliquis with h/o DVT, HTN, PAD s/p R SFA stent, and chronic hypoxic respiratory failure due to large HH who presented on 11/18 with recurrent falls.  Imaging on presentation showed large pericardial effusion that has increased in size from prior; echo pending.  Also with Dens fracture; neurosurgery recommends outpatient follow up and C-collar.  Also with L pleural effusion, not enough fluid for thoracentesis per IR.  Assessment and Plan:  Fall at home  Per nephew, she has been falling at home and needs placement Dens fracture with current fall She is willing to agree to placement at this time Family was hoping that she could go directly to ALF at [facility] with PT/OT there but her needs are too great She has been accepted at [facility] for rehab  and will discharge to there today Quantiferon gold ordered   Pericardial effusion CT imaging showing large pericardial effusion that is probably increased from chest CT done a few months ago Last echocardiogram on 05/05/2023 showed trivial pericardial effusion.   Repeat echocardiogram ordered and showed small pericardial effusion She denies any chest pain or sob She does not appear to need further evaluation or intervention for this issue currently   Dens fracture  Minimally displaced type 3 dens fracture. On the left the fracture extends into the left transverse foramen. No vascular injury on CTA neck Neurosurgery was consulted and recommended follow-up outpatient in a week Continue c-collar. Pain control.  Therapy evaluations ordered and discussed with family at bedside, plan for snf today   Left Pleural effusion Patient with history of combined systolic and diastolic heart failure  CT shows large left pleural effusion.  IR consulted for thoracentesis, but surprisingly there is only scant fluid not amenable for thoracentesis.  She is currently on 2L [place] O2 (she wears nocturnal O2 at home)   Stage 3a CKD Slightly worse than baseline today Encourage PO intake Hold Lasix Given 250 cc bolus with return to baseline   Persistent atrial fibrillation  Rate better controlled with pain control, tachycardia has resolved Continue metoprolol Patient on amiodarone, which was discontinued earlier this month due to thyroid issues Resumed Eliquis as thoracentesis couldn't be done   History of dysphagia Continue dysphagia 1 diet as per SLP recommendations    Abnormal TSH Patient with abnormal TSH on 11/6 thought potentially to be amiodarone induced.  Has  discontinued amiodarone since 11/8 TSH elevated  at 55 but improved from 68.  Free t4 is low at 0.38.  Recommend checking thyroid panel in 4 to 6 weeks Thyroid US ordered but will cancel (can't be done with collar on) - recommend outpatient  f/u    Chronic combined systolic (congestive) and diastolic (congestive) heart failure  05/05/2023- EF of 50-55% with global hypokinesis. LV diastolic indeterminate. Moderately elevated pulmonary artery systolic pressure. Small pericardial effusion. Trivial MV regurgitation. Mild to moderate TV regurgitation Hold lasix for now and monitor   Chronic hypoxic respiratory failure  Thought secondary to large hiatal hernia Currently remains on 2 L via nasal cannula Reports only wearing nocturnal O2 at home, ?compliance   Pure hypercholesterolemia Continue statin   PAD (peripheral artery disease)  S/p right SFA stent Resume eliquis   Dementia Concern for hospital delirium vs neck pain vs from pain meds - this is significantly improved Prn haldol Delirium precautions Nephew reports some MCI at home, likely sundowning and delirium associated   DNR Reviewed on admission  Out of facility form signed     Consultants: IR PT SLP   Procedures: None   Antibiotics: None    30 Day Unplanned Readmission Risk Score    Flowsheet Row ED to Hosp-Admission (Discharged) from 05/04/2023 in [facility]LO[facility] UROLOGY  30 Day Unplanned Readmission Risk Score (%) 17.33 Filed at 05/08/2023 1200       This score is the patient's risk of an unplanned readmission within 30 days of being discharged (0 -100%). The score is based on dignosis, age, lab data, medications, orders, and past utilization.   Low:  0-14.9   Medium: 15-21.9   High: 22-29.9   Extreme: 30 and above         Pain control - [place] Controlled Substance Reporting System database was reviewed. and patient was instructed, not to drive, operate heavy machinery, perform activities at heights, swimming or participation in water activities or provide baby-sitting services while on Pain, Sleep and Anxiety Medications; until their outpatient Physician has advised to do so again. Also recommended to not to  take more than prescribed Pain, Sleep and Anxiety Medications.   Disposition: Skilled nursing facility Diet recommendation:  Dysphagia 3, thin liquids DISCHARGE MEDICATION: Allergies as of 06/09/2023       Reactions   Penicillin G Hives        Medication List     STOP taking these medications    furosemide 20 MG tablet Commonly known as: LASIX   potassium chloride SA 20 MEQ tablet Commonly known as: KLOR-CON M       TAKE these medications    apixaban 5 MG Tabs tablet Commonly known as: ELIQUIS Take 1 tablet (5 mg total) by mouth 2 (two) times daily.   atorvastatin 40 MG tablet Commonly known as: LIPITOR Take 1 tablet (40 mg total) by mouth daily.   HYDROcodone-acetaminophen 5-325 MG tablet Commonly known as: NORCO/VICODIN Take 1 tablet by mouth every 6 (six) hours as needed for moderate pain (pain score 4-6) or severe pain (pain score 7-10).   metoprolol tartrate 100 MG tablet Commonly known as: LOPRESSOR Take 1 tablet (100 mg total) by mouth 2 (two) times daily.   multivitamin with minerals Tabs tablet Take 1 tablet by mouth daily.   pantoprazole 40 MG tablet Commonly known as: Protonix Take 1 tablet (40 mg total) by mouth 2 (two) times daily.   polyethylene glycol 17 g packet  Commonly known as: MIRALAX / GLYCOLAX Take 17 g by mouth daily as needed for mild constipation.   sucralfate 1 GM/10ML suspension Commonly known as: CARAFATE Take 10 mLs (1 g total) by mouth every 6 (six) hours.               Discharge Care Instructions  (From admission, onward)           Start     Ordered   06/09/23 0000  Discharge wound care:       Comments: Keep skin tears clean and dry   06/09/23 1031   06/09/23 0000  Discharge wound care:       Comments: Keep skin tear wounds clean and dry   06/09/23 1033            Contact information for follow-up providers     Ivonne Andrew, NP. Schedule an appointment as soon as possible for a visit in 1  week.   Specialties: Pulmonary Disease, Endocrinology Contact information: [address] [place] [address] [phone]         Donalee Citrin, MD.   Specialty: Neurosurgery Why: If symptoms worsen Contact information: [address] [place] [address] [phone]         [facility] Department at [facility].   Specialty: Emergency Medicine Contact information: [address] [place] [address] [phone]             Contact information for after-discharge care     Destination     [facility] .   Service: Skilled Nursing Contact information: [address] [place] [address] [phone]                    Discharge Exam: Ceasar Mons Weights   06/08/23 1429  Weight: 60.5 kg     Subjective: Feeling ok.  L knee pain today due to arthritis.  Ate a good breakfast.   Objective: Vitals:   06/09/23 0518 06/09/23 0727  BP: (!) 147/88 (!) 143/99  Pulse: 79 84  Resp: 18 16  Temp: 97.6 F (36.4 C) 97.9 F (36.6 C)  SpO2: 100% 91%    Intake/Output Summary (Last 24 hours) at 06/09/2023 1034 Last data filed at 06/09/2023 6387 Gross per 24 hour  Intake 357.12 ml  Output 400 ml  Net -42.88 ml   Filed Weights   06/08/23 1429  Weight: 60.5 kg    Exam:  General:  Appears calm and comfortable and is in NAD, hard C-collar in place Eyes:  EOMI, normal lids, iris ENT:  grossly normal hearing, lips & tongue, mmm Neck:  no LAD, masses or thyromegaly Cardiovascular:  RRR. No LE edema.  Respiratory:   CTA bilaterally with no wheezes/rales/rhonchi.  Normal respiratory effort. Abdomen:  soft, NT, ND Skin:  no rash or induration seen on limited exam; diffuse skin tears and contusions, improving Musculoskeletal:  grossly normal tone BUE/BLE, good ROM, no bony abnormality, C-collar in place Psychiatric:  grossly normal  mood and affect, speech fluent and appropriate, AOx3 Neurologic:  CN 2-12 grossly intact, moves all extremities in coordinated fashion  Data Reviewed: I have reviewed the patient's lab results since admission.  Pertinent labs for today include:   Na++ 134 Glucose 110 WBC 6.5 Hgb 9.8, down from 10 yesterday    Condition at discharge: stable  The results of significant diagnostics from this hospitalization (including imaging,  microbiology, ancillary and laboratory) are listed below for reference.   Imaging Studies: ECHOCARDIOGRAM LIMITED  Result Date: 06/06/2023    ECHOCARDIOGRAM LIMITED REPORT   Patient Name:   MYELLE KALAMA Date of Exam: 06/06/2023 Medical Rec #:  [id number]    Height:       65.0 in Accession #:    [id number]   Weight:       140.0 lb Date of Birth:  Dec 15, 1934    BSA:          1.700 m Patient Age:    88 years     BP:           137/97 mmHg Patient Gender: F            HR:           122 bpm. Exam Location:  Inpatient Procedure: Limited Echo and Cardiac Doppler Indications:     Pericardial Effusion I31.3  History:         Patient has prior history of Echocardiogram examinations, most                  recent 05/05/2023.  Sonographer:     Gregary Cromer RDCS Referring Phys:  [id number] CHING T TU Diagnosing Phys: Armanda Magic MD IMPRESSIONS  1. Left ventricular ejection fraction, by estimation, is 50 to 55%. The left ventricle has low normal function. The left ventricle has no regional wall motion abnormalities.  2. Right ventricular systolic function was not well visualized.  3. The mitral valve is normal in structure. No evidence of mitral valve regurgitation.  4. The aortic valve is tricuspid. Aortic valve sclerosis/calcification is present, without any evidence of aortic stenosis.  5. A small pericardial effusion is present. The pericardial effusion is surrounding the apex, posterior to the left ventricle and lateral to the left ventricle.     The pericardial effusion is predominantly  at the apex measuring 0.899 at greatest diameter. The IVC is not visualized. There is <50% variation in MV inflow with respirations. No evidence of tamponade. FINDINGS  Left Ventricle: Left ventricular ejection fraction, by estimation, is 50 to 55%. The left ventricle has low normal function. The left ventricle has no regional wall motion abnormalities. The left ventricular internal cavity size was normal in size. There is no left ventricular hypertrophy. Right Ventricle: Right ventricular systolic function was not well visualized. Left Atrium: Left atrial size was normal in size. Right Atrium: Right atrial size was normal in size. Pericardium: The pericardial effusion is predominantly at the apex measuring 0.899 at greatest diameter. A small pericardial effusion is present. The pericardial effusion is surrounding the apex, posterior to the left ventricle and lateral to the left ventricle. Mitral Valve: The mitral valve is normal in structure. Tricuspid Valve: The tricuspid valve is normal in structure. Tricuspid valve regurgitation is not demonstrated. No evidence of tricuspid stenosis. Aortic Valve: The aortic valve is tricuspid. Aortic valve sclerosis/calcification is present, without any evidence of aortic stenosis. Pulmonic Valve: The pulmonic valve was normal in structure. Pulmonic valve regurgitation is not visualized. No evidence of pulmonic stenosis. Aorta: The aortic root is normal in size and structure. Venous: The inferior vena cava was not well visualized. IAS/Shunts: No atrial level shunt detected by color flow Doppler. LEFT VENTRICLE PLAX 2D LVIDd:         3.10 cm LVIDs:         2.40 cm LV PW:         1.00 cm LV  IVS:        1.00 cm  Armanda Magic MD Electronically signed by Armanda Magic MD Signature Date/Time: 06/06/2023/7:20:32 PM    Final (Updated)    IR US CHEST  Result Date: 06/06/2023 INDICATION: 88 year old female. Incidental finding of left-sided pleural effusion on chest x-ray. Team is  requesting a therapeutic and diagnostic left-sided thoracentesis. EXAM: Limited LEFT CHEST ULTRASOUND COMPARISON:  Chest XR, 06/05/2023.  CT chest, 05/07/2023. FINDINGS: Focused ultrasound along the LEFT chest. Images demonstrating trace LEFT pleural effusion. No significant pocket of fluid or percutaneous window to allow safe thoracentesis. Risks outweigh the benefits. IMPRESSION: Trace LEFT pleural effusion without a safe window for percutaneous access Thoracentesis was NOT performed. Performed by Anders Grant NP Electronically Signed   By: Roanna Banning M.D.   On: 06/06/2023 11:03   CT Angio Neck W and/or Wo Contrast  Result Date: 06/05/2023 CLINICAL DATA:  Cervical spine fracture. Type 3 dens fracture with extension to the transverse foramen on the left. EXAM: CT ANGIOGRAPHY NECK TECHNIQUE: Multidetector CT imaging of the neck was performed using the standard protocol during bolus administration of intravenous contrast. Multiplanar CT image reconstructions and MIPs were obtained to evaluate the vascular anatomy. Carotid stenosis measurements (when applicable) are obtained utilizing NASCET criteria, using the distal internal carotid diameter as the denominator. RADIATION DOSE REDUCTION: This exam was performed according to the departmental dose-optimization program which includes automated exposure control, adjustment of the mA and/or kV according to patient size and/or use of iterative reconstruction technique. CONTRAST:  75mL OMNIPAQUE IOHEXOL 350 MG/ML SOLN COMPARISON:  Cervical spine CT same day FINDINGS: Aortic arch: Aortic atherosclerosis. Large pericardial effusion is noted, probably increased from the chest CT done 1 month ago. Right carotid system: Common carotid artery widely patent to the bifurcation. Soft and calcified plaque at the carotid bifurcation and ICA bulb. No stenosis greater than 20%. Stenosis in the cervical ICA beyond the bulb shows a minimal diameter of 2.5 mm. Compared to the  normal diameter of 5 mm, this indicates a 50% stenosis. Left carotid system: Common carotid artery widely patent to the bifurcation. Calcified plaque at the carotid bifurcation and ICA bulb. No stenosis. Vertebral arteries: Atherosclerotic plaque at both vertebral artery origins. No stenosis on the left. On the right, there is a 50% stenosis. Atherosclerotic disease affecting the proximal right vertebral artery but without further stenosis greater than 20%. Beyond that the vessels are widely patent through the cervical region. This includes at the C2 level where there is no evidence intimal injury. Skeleton: Type 3 fracture of the dens as shown by the prior cervical spine Other neck: No mass or lymphadenopathy. Upper chest: Large left effusion with volume loss of the left lung. IMPRESSION: 1. No evidence of arterial injury in the neck, with specific attention to the C2 level. 2. 50% stenosis of the cervical ICA beyond the bulb on the right. 3. 50% stenosis of the right vertebral artery origin. 4. Large pericardial effusion, probably increased from the chest CT done 1 month ago. 5. Large left pleural effusion with volume loss of the left lung. 6. Type 3 fracture of the dens as shown by the prior cervical spine CT. Aortic Atherosclerosis (ICD10-I70.0). Electronically Signed   By: Paulina Fusi M.D.   On: 06/05/2023 17:41   CT Head Wo Contrast  Result Date: 06/05/2023 CLINICAL DATA:  Head trauma, moderate-severe fall on eliq; Neck trauma (Age >= 65y) fall w neck pain EXAM: CT HEAD WITHOUT CONTRAST CT CERVICAL SPINE WITHOUT  CONTRAST TECHNIQUE: Multidetector CT imaging of the head and cervical spine was performed following the standard protocol without intravenous contrast. Multiplanar CT image reconstructions of the cervical spine were also generated. RADIATION DOSE REDUCTION: This exam was performed according to the departmental dose-optimization program which includes automated exposure control, adjustment of the  mA and/or kV according to patient size and/or use of iterative reconstruction technique. COMPARISON:  None Available. FINDINGS: CT HEAD FINDINGS Brain: No hemorrhage. No hydrocephalus. No extra-axial fluid collection. No mass effect. No mass lesion. No CT evidence of an acute cortical infarct. There is sequela of moderate chronic microvascular ischemic change. Generalized volume loss. Vascular: No hyperdense vessel or unexpected calcification. Skull: Normal. Negative for fracture or focal lesion. Sinuses/Orbits: No middle ear or mastoid effusion. Paranasal sinuses are clear. Bilateral lens replacement. Orbits are otherwise unremarkable. Other: None. CT CERVICAL SPINE FINDINGS Alignment: There is minimally displaced type 3 dens fracture. On the left the fracture extends into the left transverse foramen. Skull base and vertebrae: There is minimally displaced type 3 dens fracture. On the left the fracture extends into the left transverse foramen. Possible additional minimal superior endplate compression deformity at T1. Soft tissues and spinal canal: There is mild prevertebral soft tissue edema. No definite evidence of an epidural hematoma. There is a degenerative pannus at the C1-C2 articulation. Disc levels:  No evidence of high-grade spinal canal stenosis Upper chest: There is a left-sided pleural effusion, unchanged compared to 05/07/2023 Other: None IMPRESSION: 1. No CT evidence of intracranial injury. 2. Minimally displaced type 3 dens fracture. On the left the fracture extends into the left transverse foramen. Recommend further evaluation with a CTA of the neck to exclude vascular injury. 3. Possible additional minimal superior endplate compression deformity at T1. Electronically Signed   By: Lorenza Cambridge M.D.   On: 06/05/2023 13:39   CT Cervical Spine Wo Contrast  Result Date: 06/05/2023 CLINICAL DATA:  Head trauma, moderate-severe fall on eliq; Neck trauma (Age >= 65y) fall w neck pain EXAM: CT HEAD  WITHOUT CONTRAST CT CERVICAL SPINE WITHOUT CONTRAST TECHNIQUE: Multidetector CT imaging of the head and cervical spine was performed following the standard protocol without intravenous contrast. Multiplanar CT image reconstructions of the cervical spine were also generated. RADIATION DOSE REDUCTION: This exam was performed according to the departmental dose-optimization program which includes automated exposure control, adjustment of the mA and/or kV according to patient size and/or use of iterative reconstruction technique. COMPARISON:  None Available. FINDINGS: CT HEAD FINDINGS Brain: No hemorrhage. No hydrocephalus. No extra-axial fluid collection. No mass effect. No mass lesion. No CT evidence of an acute cortical infarct. There is sequela of moderate chronic microvascular ischemic change. Generalized volume loss. Vascular: No hyperdense vessel or unexpected calcification. Skull: Normal. Negative for fracture or focal lesion. Sinuses/Orbits: No middle ear or mastoid effusion. Paranasal sinuses are clear. Bilateral lens replacement. Orbits are otherwise unremarkable. Other: None. CT CERVICAL SPINE FINDINGS Alignment: There is minimally displaced type 3 dens fracture. On the left the fracture extends into the left transverse foramen. Skull base and vertebrae: There is minimally displaced type 3 dens fracture. On the left the fracture extends into the left transverse foramen. Possible additional minimal superior endplate compression deformity at T1. Soft tissues and spinal canal: There is mild prevertebral soft tissue edema. No definite evidence of an epidural hematoma. There is a degenerative pannus at the C1-C2 articulation. Disc levels:  No evidence of high-grade spinal canal stenosis Upper chest: There is a left-sided pleural effusion, unchanged  compared to 05/07/2023 Other: None IMPRESSION: 1. No CT evidence of intracranial injury. 2. Minimally displaced type 3 dens fracture. On the left the fracture extends  into the left transverse foramen. Recommend further evaluation with a CTA of the neck to exclude vascular injury. 3. Possible additional minimal superior endplate compression deformity at T1. Electronically Signed   By: Lorenza Cambridge M.D.   On: 06/05/2023 13:39   DG Forearm Right  Result Date: 06/05/2023 CLINICAL DATA:  Pain and swelling in the right forearm. EXAM: RIGHT FOREARM - 2 VIEW COMPARISON:  None Available. FINDINGS: Subcutaneous edema along the forearm. No elbow joint effusion. Faint calcification distally in the triceps tendon. Soft tissue swelling dorsally along the forearm. No underlying fracture or acute bony findings identified. IMPRESSION: 1. Subcutaneous edema along the forearm, along with dorsal soft tissue swelling along the forearm. No underlying fracture or acute bony findings identified. 2. Faint calcification distally in the triceps tendon. Electronically Signed   By: Gaylyn Rong M.D.   On: 06/05/2023 13:32   DG Chest Portable 1 View  Result Date: 06/05/2023 CLINICAL DATA:  Shortness of breath, fall EXAM: PORTABLE CHEST 1 VIEW COMPARISON:  05/07/2023 FINDINGS: Moderate to large left pleural effusion. Part of the left basilar density may be due to the patient's known large left eccentric hiatal hernia containing most of the stomach and also part of the pancreas that was shown on the CT scan from 05/07/2023. Low lung volumes are present, causing crowding of the pulmonary vasculature. Minimal blunting of the right lateral costophrenic angle suggesting a small right pleural effusion. Mildly indistinct pulmonary vasculature favoring pulmonary venous hypertension. Suspected mild enlargement of the cardiopericardial silhouette. Atheromatous vascular calcification of the thoracic aorta IMPRESSION: 1. Moderate to large left pleural effusion. 2. Small right pleural effusion. 3. Mildly indistinct pulmonary vasculature favoring pulmonary venous hypertension. 4. Suspected mild enlargement  of the cardiopericardial silhouette. 5. Large left eccentric hiatal hernia containing most of the stomach and also part of the pancreas shown on prior exams, potentially accounting for some of the density at the left lung base. 6. Low lung volumes. Electronically Signed   By: Gaylyn Rong M.D.   On: 06/05/2023 13:30   VAS [place] UPPER EXTREMITY VENOUS DUPLEX  Result Date: 05/24/2023 UPPER VENOUS STUDY  Patient Name:  CADYN TEBEAU  Date of Exam:   05/24/2023 Medical Rec #: [id number]     Accession #:    [id number] Date of Birth: Mar 20, 1935     Patient Gender: F Patient Age:   70 years Exam Location:  [facility] Procedure:      VAS [place] UPPER EXTREMITY VENOUS DUPLEX Referring Phys: Charlesetta Garibaldi WEST --------------------------------------------------------------------------------  Indications: Patient fell on right forearm about 1 week ago. She denies chest pain and SOB. Anticoagulation: Eliquis. Comparison Study: None Performing Technologist: Alecia Mackin RVT, RDCS (AE), RDMS  Examination Guidelines: A complete evaluation includes B-mode imaging, spectral Doppler, color Doppler, and power Doppler as needed of all accessible portions of each vessel. Bilateral testing is considered an integral part of a complete examination. Limited examinations for reoccurring indications may be performed as noted.  Right Findings: +----------+------------+---------+-----------+----------+-------+ RIGHT     CompressiblePhasicitySpontaneousPropertiesSummary +----------+------------+---------+-----------+----------+-------+ IJV           Full       Yes       Yes                      +----------+------------+---------+-----------+----------+-------+ Subclavian    Full  Yes       Yes                      +----------+------------+---------+-----------+----------+-------+ Axillary      Full       Yes       Yes                      +----------+------------+---------+-----------+----------+-------+ Brachial       Full       Yes       Yes                      +----------+------------+---------+-----------+----------+-------+ Radial        Full                                          +----------+------------+---------+-----------+----------+-------+ Ulnar         Full                                          +----------+------------+---------+-----------+----------+-------+ Cephalic      Full                                          +----------+------------+---------+-----------+----------+-------+ Basilic       Full                                          +----------+------------+---------+-----------+----------+-------+  Findings reported to [facility], NP through secure chat at 10:00 am.  Summary:  Right: No evidence of deep vein thrombosis in the upper extremity. No evidence of superficial vein thrombosis in the upper extremity. No evidence of thrombosis in the subclavian. Large heterogenous complex appearing mass with edematous changes in tissue, most likely representing a hematoma.  *See table(s) above for measurements and observations.  Diagnosing physician: Coral Else MD Electronically signed by Coral Else MD on 05/24/2023 at 9:31:29 PM.    Final     Microbiology: Results for orders placed or performed during the hospital encounter of 03/13/23  SARS Coronavirus 2 by RT PCR (hospital order, performed in [facility] hospital lab) *cepheid single result test* Anterior Nasal Swab     Status: None   Collection Time: 03/14/23  5:40 PM   Specimen: Anterior Nasal Swab  Result Value Ref Range Status   SARS Coronavirus 2 by RT PCR NEGATIVE NEGATIVE Final    Comment: Performed at [facility] Lab, [address], [place], [place] [address]  MRSA Next Gen by PCR, Nasal     Status: None   Collection Time: 03/16/23  5:09 AM   Specimen: Nasal Mucosa; Nasal Swab  Result Value Ref Range Status   MRSA by PCR Next Gen NOT DETECTED NOT DETECTED Final    Comment: (NOTE) The  GeneXpert MRSA Assay (FDA approved for NASAL specimens only), is one component of a comprehensive MRSA colonization surveillance program. It is not intended to diagnose MRSA infection nor to guide or monitor treatment for MRSA infections. Test performance is not FDA approved in patients less than 2  years old. Performed at [facility], [address] [place], [place] [address]   Culture, Respiratory w Gram Stain     Status: None   Collection Time: 03/16/23  5:09 AM   Specimen: Tracheal Aspirate; Respiratory  Result Value Ref Range Status   Specimen Description TRACHEAL ASPIRATE  Final   Special Requests NONE  Final   Gram Stain   Final    MODERATE WBC PRESENT,BOTH PMN AND MONONUCLEAR FEW GRAM POSITIVE COCCI IN PAIRS AND CHAINS    Culture   Final    Normal respiratory flora-no Staph aureus or Pseudomonas seen Performed at [facility] Lab, [address] [place], [place] [address]    Report Status 03/19/2023 FINAL  Final  Urine Culture     Status: Abnormal   Collection Time: 03/20/23  4:32 AM   Specimen: Urine, Clean Catch  Result Value Ref Range Status   Specimen Description URINE, CLEAN CATCH  Final   Special Requests NONE  Final   Culture (A)  Final    <10,000 COLONIES/mL INSIGNIFICANT GROWTH Performed at [facility], [address] [place], [place] [address]    Report Status 03/21/2023 FINAL  Final  Culture, blood (Routine X 2) w Reflex to ID Panel     Status: None   Collection Time: 03/24/23 11:06 AM   Specimen: BLOOD LEFT HAND  Result Value Ref Range Status   Specimen Description BLOOD LEFT HAND  Final   Special Requests   Final    BOTTLES DRAWN AEROBIC ONLY Blood Culture adequate volume   Culture   Final    NO GROWTH 5 DAYS Performed at [facility], [address] [place], [place] [address]    Report Status 03/29/2023 FINAL  Final  Culture, blood (Routine X 2) w Reflex to ID Panel     Status: None   Collection Time: 03/24/23  1:26 PM    Specimen: BLOOD LEFT ARM  Result Value Ref Range Status   Specimen Description BLOOD LEFT ARM  Final   Special Requests   Final    BOTTLES DRAWN AEROBIC ONLY Blood Culture results may not be optimal due to an inadequate volume of blood received in culture bottles   Culture   Final    NO GROWTH 5 DAYS Performed at [facility], [address] [place], [place] [address]    Report Status 03/29/2023 FINAL  Final    Labs: CBC: Recent Labs  Lab 06/05/23 1115 06/06/23 0508 06/07/23 0750 06/08/23 0751 06/09/23 0649  WBC 6.9 8.9 8.3 7.6 6.5  NEUTROABS 5.7  --  6.5 5.8 4.9  HGB 11.3* 10.8* 11.3* 10.0* 9.8*  HCT 36.5 34.0* 35.6* 32.0* 31.5*  MCV 93.6 91.4 91.8 91.2 92.6  PLT 251 265 223 221 206   Basic Metabolic Panel: Recent Labs  Lab 06/05/23 1115 06/06/23 0508 06/07/23 0750 06/08/23 0751 06/09/23 0649  NA 135 135 137 136 134*  K 3.0* 3.4* 3.4* 4.0 3.7  CL 95* 95* 98 97* 97*  CO2 29 28 28 30 31   GLUCOSE 117* 136* 107* 121* 110*  BUN 16 16 20  27* 22  CREATININE 0.86 1.14* 0.97 1.43* 0.88  CALCIUM 8.7* 9.2 9.1 9.0 8.8*  MG  --   --   --  2.2  --    Liver Function Tests: No results for input(s): "AST", "ALT", "ALKPHOS", "BILITOT", "PROT", "ALBUMIN" in the last 168 hours. CBG: No results for input(s): "GLUCAP" in the  last 168 hours.  Discharge time spent: greater than 30 minutes.  Signed: Jonah Blue, MD [facility] 06/09/2023

## 2023-06-09 NOTE — TOC Transition Note (Signed)
Transition of Care [facility]) - CM/SW Discharge Note   Patient Details  Name: Sarah Wolf MRN: [id number] Date of Birth: [birth year]  [facility] of [facility]) CM/SW Contact:  Carley Hammed, LCSW Phone Number: 06/09/2023, 9:38 AM   Clinical Narrative:    Pt to be transported to [facility] via [place]. Nurse to call report to [phone].   Final next level of care: Skilled Nursing Facility Barriers to Discharge: Barriers Resolved   Patient Goals and CMS Choice CMS Medicare.gov Compare Post Acute Care list provided to:: Patient Represenative (must comment) Choice offered to / list presented to : [facility] POA / Guardian (Bud, Nephew, HCPOA)  Discharge Placement                Patient chooses bed at: [facility] Patient to be transferred to facility by: PTAR Name of family member notified: Bud, HCPOA; Niece Meagan Patient and family notified of of transfer: 06/09/23  Discharge Plan and Services Additional resources added to the After Visit Summary for   In-house Referral: Clinical Social Work   Post Acute Care Choice: Skilled Nursing Facility                               Social Determinants of Health (SDOH) Interventions SDOH Screenings   Food Insecurity: Patient Unable To Answer (06/06/2023)  Housing: High Risk (06/06/2023)  Transportation Needs: Patient Unable To Answer (06/06/2023)  Utilities: Patient Unable To Answer (06/06/2023)  Tobacco Use: Low Risk  (06/05/2023)     Readmission Risk Interventions     No data to display

## 2023-06-09 NOTE — Progress Notes (Signed)
I have reviewed and concur with this student's documentation.   Reva Bores, RN 06/09/2023 2:36 PM

## 2023-06-09 NOTE — Progress Notes (Signed)
Tasmia L Sokoloski to be D/C'd  per MD order.  Discussed with the patient and all questions fully answered. Gave report to [facility] LPN at [facility].  VSS, Skin clean, dry and intact without evidence of skin break down, no evidence of skin tears noted.  IV catheter discontinued intact. Site without signs and symptoms of complications. Dressing and pressure applied.  An After Visit Summary was printed and given to the PTAR.  Patient instructed to return to ED, call 911, or call MD for any changes in condition.   Patient to be escorted via PTAR.

## 2023-06-12 ENCOUNTER — Other Ambulatory Visit: Payer: Self-pay | Admitting: Nurse Practitioner

## 2023-06-12 DIAGNOSIS — J90 Pleural effusion, not elsewhere classified: Secondary | ICD-10-CM | POA: Diagnosis not present

## 2023-06-12 DIAGNOSIS — I5040 Unspecified combined systolic (congestive) and diastolic (congestive) heart failure: Secondary | ICD-10-CM | POA: Diagnosis not present

## 2023-06-12 DIAGNOSIS — R296 Repeated falls: Secondary | ICD-10-CM | POA: Diagnosis not present

## 2023-06-12 DIAGNOSIS — S12110A Anterior displaced Type II dens fracture, initial encounter for closed fracture: Secondary | ICD-10-CM | POA: Diagnosis not present

## 2023-06-12 DIAGNOSIS — Z711 Person with feared health complaint in whom no diagnosis is made: Secondary | ICD-10-CM

## 2023-06-12 LAB — QUANTIFERON-TB GOLD PLUS: QuantiFERON-TB Gold Plus: UNDETERMINED — AB

## 2023-06-12 LAB — QUANTIFERON-TB GOLD PLUS (RQFGPL)
QuantiFERON Mitogen Value: 0.17 [IU]/mL
QuantiFERON Nil Value: 0.01 [IU]/mL
QuantiFERON TB1 Ag Value: 0.01 [IU]/mL
QuantiFERON TB2 Ag Value: 0.01 [IU]/mL

## 2023-06-13 DIAGNOSIS — S12110A Anterior displaced Type II dens fracture, initial encounter for closed fracture: Secondary | ICD-10-CM | POA: Diagnosis not present

## 2023-06-13 DIAGNOSIS — M6281 Muscle weakness (generalized): Secondary | ICD-10-CM | POA: Diagnosis not present

## 2023-06-13 DIAGNOSIS — I4891 Unspecified atrial fibrillation: Secondary | ICD-10-CM | POA: Diagnosis not present

## 2023-06-13 DIAGNOSIS — R2681 Unsteadiness on feet: Secondary | ICD-10-CM | POA: Diagnosis not present

## 2023-06-13 DIAGNOSIS — I3139 Other pericardial effusion (noninflammatory): Secondary | ICD-10-CM | POA: Diagnosis not present

## 2023-06-13 DIAGNOSIS — F0390 Unspecified dementia without behavioral disturbance: Secondary | ICD-10-CM | POA: Diagnosis not present

## 2023-06-13 DIAGNOSIS — R278 Other lack of coordination: Secondary | ICD-10-CM | POA: Diagnosis not present

## 2023-06-13 DIAGNOSIS — I5040 Unspecified combined systolic (congestive) and diastolic (congestive) heart failure: Secondary | ICD-10-CM | POA: Diagnosis not present

## 2023-06-14 ENCOUNTER — Ambulatory Visit: Payer: Medicare Other | Admitting: Vascular Surgery

## 2023-06-14 ENCOUNTER — Encounter: Payer: Self-pay | Admitting: Vascular Surgery

## 2023-06-14 VITALS — BP 93/60 | HR 69 | Temp 96.7°F | Ht 65.0 in | Wt 133.0 lb

## 2023-06-14 DIAGNOSIS — I70223 Atherosclerosis of native arteries of extremities with rest pain, bilateral legs: Secondary | ICD-10-CM | POA: Diagnosis not present

## 2023-06-14 DIAGNOSIS — I4891 Unspecified atrial fibrillation: Secondary | ICD-10-CM | POA: Diagnosis not present

## 2023-06-14 DIAGNOSIS — I5040 Unspecified combined systolic (congestive) and diastolic (congestive) heart failure: Secondary | ICD-10-CM | POA: Diagnosis not present

## 2023-06-14 DIAGNOSIS — S12110A Anterior displaced Type II dens fracture, initial encounter for closed fracture: Secondary | ICD-10-CM | POA: Diagnosis not present

## 2023-06-14 DIAGNOSIS — F0390 Unspecified dementia without behavioral disturbance: Secondary | ICD-10-CM | POA: Diagnosis not present

## 2023-06-14 NOTE — Progress Notes (Signed)
Patient ID: Sarah Wolf, female   DOB: 03-17-1935, 88 y.o.   MRN: [id number]  Reason for Consult: Follow-up   Referred by Ivonne Andrew, NP  Subjective:     HPI:  Sarah Wolf is a 88 y.o. female initially presented with acute on chronic bilateral lower extremity ischemia and was treated with right lower extremity lysis and stenting of the SFA.  After our last visit she fell while at home and is now in a nursing facility.  She does have some pain in her bilateral feet as well as small wounds which are being cared for by her therapist at the nursing facility.  She is not walking much currently in a wheelchair.  Previously was having weeping from the legs which caused some ulceration on the right foot but they have controlled her swelling much better with the Lasix per family at bedside.  She is unable to really provide adequate history today without the help of her nephew.  Past Medical History:  Diagnosis Date   Atherosclerosis of native arteries of extremities with rest pain, right leg (HCC)    CHF (congestive heart failure) (HCC)    Diaphragmatic hernia without obstruction or gangrene    GERD (gastroesophageal reflux disease)    History of DVT (deep vein thrombosis)    Hypertension    Peripheral arterial disease (HCC)    Persistent atrial fibrillation (HCC)    Personal history of peptic ulcer disease    Type 2 diabetes mellitus with diabetic peripheral angiopathy without gangrene (HCC)    Unspecified hearing loss, bilateral    History reviewed. No pertinent family history. Past Surgical History:  Procedure Laterality Date   ABDOMINAL AORTOGRAM W/LOWER EXTREMITY N/A 03/15/2023   Procedure: ABDOMINAL AORTOGRAM W/LOWER EXTREMITY;  Surgeon: Victorino Sparrow, MD;  Location: [facility] INVASIVE CV LAB;  Service: Cardiovascular;  Laterality: N/A;   BIOPSY  03/26/2023   Procedure: BIOPSY;  Surgeon: Benancio Deeds, MD;  Location: [facility] ENDOSCOPY;  Service: Gastroenterology;;    ESOPHAGOGASTRODUODENOSCOPY (EGD) WITH PROPOFOL N/A 03/26/2023   Procedure: ESOPHAGOGASTRODUODENOSCOPY (EGD) WITH PROPOFOL;  Surgeon: Benancio Deeds, MD;  Location: [facility] ENDOSCOPY;  Service: Gastroenterology;  Laterality: N/A;   ORIF FEMUR FRACTURE Right 03/08/2019   Procedure: OPEN REDUCTION INTERNAL FIXATION (ORIF) DISTAL FEMUR FRACTURE;  Surgeon: Kathryne Hitch, MD;  Location: [facility] ORS;  Service: Orthopedics;  Laterality: Right;   PERIPHERAL VASCULAR INTERVENTION Right 03/16/2023   Procedure: PERIPHERAL VASCULAR INTERVENTION;  Surgeon: Cephus Shelling, MD;  Location: [facility] INVASIVE CV LAB;  Service: Cardiovascular;  Laterality: Right;  SFA   PERIPHERAL VASCULAR THROMBECTOMY N/A 03/16/2023   Procedure: LYSIS RECHECK;  Surgeon: Cephus Shelling, MD;  Location: [facility] INVASIVE CV LAB;  Service: Cardiovascular;  Laterality: N/A;    Short Social History:  Social History   Tobacco Use   Smoking status: Never   Smokeless tobacco: Never  Substance Use Topics   Alcohol use: Not Currently    Allergies  Allergen Reactions   Penicillin G Hives    Current Outpatient Medications  Medication Sig Dispense Refill   apixaban (ELIQUIS) 5 MG TABS tablet Take 1 tablet (5 mg total) by mouth 2 (two) times daily. 90 tablet 1   atorvastatin (LIPITOR) 40 MG tablet Take 1 tablet (40 mg total) by mouth daily. 90 tablet 1   HYDROcodone-acetaminophen (NORCO/VICODIN) 5-325 MG tablet Take 1 tablet by mouth every 6 (six) hours as needed for moderate pain (pain score 4-6) or severe pain (pain score 7-10).  30 tablet 0   metoprolol tartrate (LOPRESSOR) 100 MG tablet Take 1 tablet (100 mg total) by mouth 2 (two) times daily. 90 tablet 1   Multiple Vitamin (MULTIVITAMIN WITH MINERALS) TABS tablet Take 1 tablet by mouth daily.     pantoprazole (PROTONIX) 40 MG tablet Take 1 tablet (40 mg total) by mouth 2 (two) times daily. 60 tablet 11   polyethylene glycol (MIRALAX / GLYCOLAX) 17 g packet Take 17 g by mouth  daily as needed for mild constipation. 14 each 0   sucralfate (CARAFATE) 1 GM/10ML suspension Take 10 mLs (1 g total) by mouth every 6 (six) hours. 414 mL 0   No current facility-administered medications for this visit.    Review of Systems  Constitutional:  Constitutional negative. HENT: HENT negative.  Eyes: Eyes negative.  Respiratory: Respiratory negative.  Cardiovascular: Cardiovascular negative.  GI: Gastrointestinal negative.  Musculoskeletal: Musculoskeletal negative. Positive for leg pain.  Skin: Positive for rash and wound.  Neurological: Neurological negative. Hematologic: Hematologic/lymphatic negative.  Psychiatric: Psychiatric negative.        Objective:  Objective   Vitals:   06/14/23 1128  BP: 93/60  Pulse: 69  Temp: (!) 96.7 F (35.9 C)  TempSrc: Temporal  Weight: 133 lb (60.3 kg)  Height: 5\' 5"  (1.651 m)   Body mass index is 22.13 kg/m.  Physical Exam HENT:     Head: Normocephalic.     Nose: Nose normal.  Eyes:     Pupils: Pupils are equal, round, and reactive to light.  Cardiovascular:     Pulses:          Popliteal pulses are 0 on the left side.       Dorsalis pedis pulses are 0 on the right side and 0 on the left side.       Posterior tibial pulses are 0 on the right side and 0 on the left side.  Pulmonary:     Effort: Pulmonary effort is normal.  Abdominal:     General: Abdomen is flat.  Musculoskeletal:     Cervical back: Normal range of motion and neck supple.  Skin:    Capillary Refill: Capillary refill takes more than 3 seconds.     Comments: She has a superficial wound on the right foot which was reportedly from a blister on the left second toe has superficial toe tip ulceration with superficial ulceration of the dorsum of the left foot  Neurological:     General: No focal deficit present.     Mental Status: She is alert.  Psychiatric:        Mood and Affect: Mood normal.     Data: No studies     Assessment/Plan:     88 year old female recently treated for acute on chronic right lower extremity limb threatening ischemia and also has similar issues on the left but has not been fully evaluated.  Unfortunately she does have some persistent ulceration but she has really had failure to thrive since our last evaluation.  As such I have recommended diligent care of her feet and she will follow-up in 3 months with repeat noninvasive studies to include duplex and ABIs.  This was communicated to her power of attorney at bedside and he demonstrates good understanding is in agreement with the plan.     Maeola Harman MD [facility]

## 2023-06-19 DIAGNOSIS — I5040 Unspecified combined systolic (congestive) and diastolic (congestive) heart failure: Secondary | ICD-10-CM | POA: Diagnosis not present

## 2023-06-19 DIAGNOSIS — S12110A Anterior displaced Type II dens fracture, initial encounter for closed fracture: Secondary | ICD-10-CM | POA: Diagnosis not present

## 2023-06-19 DIAGNOSIS — I4891 Unspecified atrial fibrillation: Secondary | ICD-10-CM | POA: Diagnosis not present

## 2023-06-19 DIAGNOSIS — F0390 Unspecified dementia without behavioral disturbance: Secondary | ICD-10-CM | POA: Diagnosis not present

## 2023-06-20 ENCOUNTER — Other Ambulatory Visit: Payer: Self-pay

## 2023-06-20 DIAGNOSIS — I70221 Atherosclerosis of native arteries of extremities with rest pain, right leg: Secondary | ICD-10-CM

## 2023-06-20 DIAGNOSIS — I3139 Other pericardial effusion (noninflammatory): Secondary | ICD-10-CM | POA: Diagnosis not present

## 2023-06-20 DIAGNOSIS — M6281 Muscle weakness (generalized): Secondary | ICD-10-CM | POA: Diagnosis not present

## 2023-06-20 DIAGNOSIS — R2681 Unsteadiness on feet: Secondary | ICD-10-CM | POA: Diagnosis not present

## 2023-06-20 DIAGNOSIS — R278 Other lack of coordination: Secondary | ICD-10-CM | POA: Diagnosis not present

## 2023-06-21 DIAGNOSIS — F0390 Unspecified dementia without behavioral disturbance: Secondary | ICD-10-CM | POA: Diagnosis not present

## 2023-06-21 DIAGNOSIS — I5040 Unspecified combined systolic (congestive) and diastolic (congestive) heart failure: Secondary | ICD-10-CM | POA: Diagnosis not present

## 2023-06-21 DIAGNOSIS — S12110A Anterior displaced Type II dens fracture, initial encounter for closed fracture: Secondary | ICD-10-CM | POA: Diagnosis not present

## 2023-06-21 DIAGNOSIS — I4891 Unspecified atrial fibrillation: Secondary | ICD-10-CM | POA: Diagnosis not present

## 2023-06-23 DIAGNOSIS — I5040 Unspecified combined systolic (congestive) and diastolic (congestive) heart failure: Secondary | ICD-10-CM | POA: Diagnosis not present

## 2023-06-23 DIAGNOSIS — R2681 Unsteadiness on feet: Secondary | ICD-10-CM | POA: Diagnosis not present

## 2023-06-23 DIAGNOSIS — F0390 Unspecified dementia without behavioral disturbance: Secondary | ICD-10-CM | POA: Diagnosis not present

## 2023-06-23 DIAGNOSIS — I4891 Unspecified atrial fibrillation: Secondary | ICD-10-CM | POA: Diagnosis not present

## 2023-06-23 DIAGNOSIS — I3139 Other pericardial effusion (noninflammatory): Secondary | ICD-10-CM | POA: Diagnosis not present

## 2023-06-23 DIAGNOSIS — R278 Other lack of coordination: Secondary | ICD-10-CM | POA: Diagnosis not present

## 2023-06-23 DIAGNOSIS — M6281 Muscle weakness (generalized): Secondary | ICD-10-CM | POA: Diagnosis not present

## 2023-06-23 DIAGNOSIS — S12110A Anterior displaced Type II dens fracture, initial encounter for closed fracture: Secondary | ICD-10-CM | POA: Diagnosis not present

## 2023-06-25 ENCOUNTER — Other Ambulatory Visit: Payer: Self-pay

## 2023-06-25 ENCOUNTER — Emergency Department (HOSPITAL_COMMUNITY)
Admission: EM | Admit: 2023-06-25 | Discharge: 2023-06-25 | Disposition: A | Discharge status: Home / Self Care | Payer: Medicare Other | Attending: Emergency Medicine | Admitting: Emergency Medicine

## 2023-06-25 ENCOUNTER — Emergency Department (HOSPITAL_COMMUNITY): Payer: Medicare Other

## 2023-06-25 DIAGNOSIS — W19XXXA Unspecified fall, initial encounter: Secondary | ICD-10-CM | POA: Insufficient documentation

## 2023-06-25 DIAGNOSIS — I10 Essential (primary) hypertension: Secondary | ICD-10-CM | POA: Diagnosis not present

## 2023-06-25 DIAGNOSIS — M47812 Spondylosis without myelopathy or radiculopathy, cervical region: Secondary | ICD-10-CM | POA: Diagnosis not present

## 2023-06-25 DIAGNOSIS — N390 Urinary tract infection, site not specified: Secondary | ICD-10-CM | POA: Diagnosis not present

## 2023-06-25 DIAGNOSIS — Z86718 Personal history of other venous thrombosis and embolism: Secondary | ICD-10-CM | POA: Diagnosis not present

## 2023-06-25 DIAGNOSIS — I5040 Unspecified combined systolic (congestive) and diastolic (congestive) heart failure: Secondary | ICD-10-CM | POA: Insufficient documentation

## 2023-06-25 DIAGNOSIS — Y92129 Unspecified place in nursing home as the place of occurrence of the external cause: Secondary | ICD-10-CM | POA: Insufficient documentation

## 2023-06-25 DIAGNOSIS — R531 Weakness: Secondary | ICD-10-CM | POA: Diagnosis not present

## 2023-06-25 DIAGNOSIS — Z79899 Other long term (current) drug therapy: Secondary | ICD-10-CM | POA: Diagnosis not present

## 2023-06-25 DIAGNOSIS — Z7901 Long term (current) use of anticoagulants: Secondary | ICD-10-CM | POA: Insufficient documentation

## 2023-06-25 DIAGNOSIS — I110 Hypertensive heart disease with heart failure: Secondary | ICD-10-CM | POA: Insufficient documentation

## 2023-06-25 DIAGNOSIS — S0990XA Unspecified injury of head, initial encounter: Secondary | ICD-10-CM | POA: Diagnosis present

## 2023-06-25 DIAGNOSIS — M4312 Spondylolisthesis, cervical region: Secondary | ICD-10-CM | POA: Diagnosis not present

## 2023-06-25 DIAGNOSIS — D649 Anemia, unspecified: Secondary | ICD-10-CM | POA: Insufficient documentation

## 2023-06-25 DIAGNOSIS — R739 Hyperglycemia, unspecified: Secondary | ICD-10-CM

## 2023-06-25 DIAGNOSIS — S0083XA Contusion of other part of head, initial encounter: Secondary | ICD-10-CM | POA: Insufficient documentation

## 2023-06-25 DIAGNOSIS — Z7401 Bed confinement status: Secondary | ICD-10-CM | POA: Diagnosis not present

## 2023-06-25 DIAGNOSIS — R7309 Other abnormal glucose: Secondary | ICD-10-CM | POA: Diagnosis not present

## 2023-06-25 DIAGNOSIS — B9689 Other specified bacterial agents as the cause of diseases classified elsewhere: Secondary | ICD-10-CM | POA: Diagnosis not present

## 2023-06-25 DIAGNOSIS — S12101D Unspecified nondisplaced fracture of second cervical vertebra, subsequent encounter for fracture with routine healing: Secondary | ICD-10-CM | POA: Diagnosis not present

## 2023-06-25 LAB — COMPREHENSIVE METABOLIC PANEL
ALT: 15 U/L (ref 0–44)
AST: 36 U/L (ref 15–41)
Albumin: 3.2 g/dL — ABNORMAL LOW (ref 3.5–5.0)
Alkaline Phosphatase: 54 U/L (ref 38–126)
Anion gap: 12 (ref 5–15)
BUN: 22 mg/dL (ref 8–23)
CO2: 32 mmol/L (ref 22–32)
Calcium: 8.8 mg/dL — ABNORMAL LOW (ref 8.9–10.3)
Chloride: 95 mmol/L — ABNORMAL LOW (ref 98–111)
Creatinine, Ser: 1.09 mg/dL — ABNORMAL HIGH (ref 0.44–1.00)
GFR, Estimated: 49 mL/min — ABNORMAL LOW (ref >60)
Glucose, Bld: 104 mg/dL — ABNORMAL HIGH (ref 70–99)
Potassium: 3.5 mmol/L (ref 3.5–5.1)
Sodium: 139 mmol/L (ref 135–145)
Total Bilirubin: 1.6 mg/dL — ABNORMAL HIGH (ref <1.2)
Total Protein: 6.2 g/dL — ABNORMAL LOW (ref 6.5–8.1)

## 2023-06-25 LAB — CBC WITH DIFFERENTIAL/PLATELET
Abs Immature Granulocytes: 0.02 10*3/uL (ref 0.00–0.07)
Basophils Absolute: 0 10*3/uL (ref 0.0–0.1)
Basophils Relative: 1 %
Eosinophils Absolute: 0.1 10*3/uL (ref 0.0–0.5)
Eosinophils Relative: 2 %
HCT: 32.2 % — ABNORMAL LOW (ref 36.0–46.0)
Hemoglobin: 10.3 g/dL — ABNORMAL LOW (ref 12.0–15.0)
Immature Granulocytes: 0 %
Lymphocytes Relative: 16 %
Lymphs Abs: 1 10*3/uL (ref 0.7–4.0)
MCH: 30.1 pg (ref 26.0–34.0)
MCHC: 32 g/dL (ref 30.0–36.0)
MCV: 94.2 fL (ref 80.0–100.0)
Monocytes Absolute: 0.5 10*3/uL (ref 0.1–1.0)
Monocytes Relative: 8 %
Neutro Abs: 4.5 10*3/uL (ref 1.7–7.7)
Neutrophils Relative %: 73 %
Platelets: 261 10*3/uL (ref 150–400)
RBC: 3.42 MIL/uL — ABNORMAL LOW (ref 3.87–5.11)
RDW: 20.2 % — ABNORMAL HIGH (ref 11.5–15.5)
WBC: 6.1 10*3/uL (ref 4.0–10.5)
nRBC: 0 % (ref 0.0–0.2)

## 2023-06-25 LAB — I-STAT CHEM 8, ED
BUN: 31 mg/dL — ABNORMAL HIGH (ref 8–23)
Calcium, Ion: 1.03 mmol/L — ABNORMAL LOW (ref 1.15–1.40)
Chloride: 95 mmol/L — ABNORMAL LOW (ref 98–111)
Creatinine, Ser: 1.1 mg/dL — ABNORMAL HIGH (ref 0.44–1.00)
Glucose, Bld: 105 mg/dL — ABNORMAL HIGH (ref 70–99)
HCT: 33 % — ABNORMAL LOW (ref 36.0–46.0)
Hemoglobin: 11.2 g/dL — ABNORMAL LOW (ref 12.0–15.0)
Potassium: 3.5 mmol/L (ref 3.5–5.1)
Sodium: 138 mmol/L (ref 135–145)
TCO2: 36 mmol/L — ABNORMAL HIGH (ref 22–32)

## 2023-06-25 LAB — URINALYSIS, W/ REFLEX TO CULTURE (INFECTION SUSPECTED)
Bilirubin Urine: NEGATIVE
Glucose, UA: NEGATIVE mg/dL
Ketones, ur: NEGATIVE mg/dL
Nitrite: POSITIVE — AB
Protein, ur: 100 mg/dL — AB
Specific Gravity, Urine: 1.012 (ref 1.005–1.030)
WBC, UA: >50 WBC/hpf (ref 0–5)
pH: 6 (ref 5.0–8.0)

## 2023-06-25 LAB — SAMPLE TO BLOOD BANK

## 2023-06-25 LAB — PROTIME-INR
INR: 1.6 — ABNORMAL HIGH (ref 0.8–1.2)
Prothrombin Time: 18.8 s — ABNORMAL HIGH (ref 11.4–15.2)

## 2023-06-25 LAB — I-STAT CG4 LACTIC ACID, ED: Lactic Acid, Venous: 1.1 mmol/L (ref 0.5–1.9)

## 2023-06-25 LAB — ETHANOL: Alcohol, Ethyl (B): <10 mg/dL (ref <10)

## 2023-06-25 MED ORDER — CEFPODOXIME PROXETIL 200 MG PO TABS: 200.0000 mg | ORAL_TABLET | Freq: Two times a day (BID) | ORAL | 0 refills | Status: DC

## 2023-06-25 MED ORDER — CEFDINIR 300 MG PO CAPS
300.0000 mg | ORAL_CAPSULE | Freq: Two times a day (BID) | ORAL | Status: DC
Start: 2023-06-25 — End: 2023-06-25
  Administered 2023-06-25: 300 mg via ORAL
  Filled 2023-06-25: qty 1

## 2023-06-25 NOTE — ED Notes (Signed)
Patient transported to CT scan . 

## 2023-06-25 NOTE — Discharge Instructions (Addendum)
Please continue to wear your collar until the neurosurgeon tells you that it is safe to go without it.  Please take the antibiotic for your urinary infection until it is all gone.  If your culture shows that you need to be on a different antibiotic, we will contact you.  Return if you start running a fever, or have any other new or concerning symptoms.

## 2023-06-25 NOTE — ED Notes (Signed)
Trauma Response Nurse Documentation   Sarah Wolf is a 88 y.o. female arriving to [facility] ED via EMS  On Eliquis (apixaban) daily. Trauma was activated as a Level 2 by Lester Kinsman RN based on the following trauma criteria Elderly patients > 65 with head trauma on anti-coagulation (excluding ASA).  Patient cleared for CT by Dr. Preston Fleeting. Pt transported to CT with trauma response nurse present to monitor. RN remained with the patient throughout their absence from the department for clinical observation.   GCS 15.  History   Past Medical History:  Diagnosis Date   Atherosclerosis of native arteries of extremities with rest pain, right leg (HCC)    CHF (congestive heart failure) (HCC)    Diaphragmatic hernia without obstruction or gangrene    GERD (gastroesophageal reflux disease)    History of DVT (deep vein thrombosis)    Hypertension    Peripheral arterial disease (HCC)    Persistent atrial fibrillation (HCC)    Personal history of peptic ulcer disease    Type 2 diabetes mellitus with diabetic peripheral angiopathy without gangrene (HCC)    Unspecified hearing loss, bilateral      Past Surgical History:  Procedure Laterality Date   ABDOMINAL AORTOGRAM W/LOWER EXTREMITY N/A 03/15/2023   Procedure: ABDOMINAL AORTOGRAM W/LOWER EXTREMITY;  Surgeon: Victorino Sparrow, MD;  Location: [facility] INVASIVE CV LAB;  Service: Cardiovascular;  Laterality: N/A;   BIOPSY  03/26/2023   Procedure: BIOPSY;  Surgeon: Benancio Deeds, MD;  Location: [facility] ENDOSCOPY;  Service: Gastroenterology;;   ESOPHAGOGASTRODUODENOSCOPY (EGD) WITH PROPOFOL N/A 03/26/2023   Procedure: ESOPHAGOGASTRODUODENOSCOPY (EGD) WITH PROPOFOL;  Surgeon: Benancio Deeds, MD;  Location: [facility] ENDOSCOPY;  Service: Gastroenterology;  Laterality: N/A;   ORIF FEMUR FRACTURE Right 03/08/2019   Procedure: OPEN REDUCTION INTERNAL FIXATION (ORIF) DISTAL FEMUR FRACTURE;  Surgeon: Kathryne Hitch, MD;  Location: [facility] ORS;  Service:  Orthopedics;  Laterality: Right;   PERIPHERAL VASCULAR INTERVENTION Right 03/16/2023   Procedure: PERIPHERAL VASCULAR INTERVENTION;  Surgeon: Cephus Shelling, MD;  Location: [facility] INVASIVE CV LAB;  Service: Cardiovascular;  Laterality: Right;  SFA   PERIPHERAL VASCULAR THROMBECTOMY N/A 03/16/2023   Procedure: LYSIS RECHECK;  Surgeon: Cephus Shelling, MD;  Location: [facility] INVASIVE CV LAB;  Service: Cardiovascular;  Laterality: N/A;       Initial Focused Assessment (If applicable, or please see trauma documentation): Airway-- intact, no visible obstruction Breathing-- spontaneous, unlabored Circulation-- no obvious bleeding noted on exam  CT's Completed:   CT Head and CT C-Spine   Interventions:  See event summary  Plan for disposition:  Discharge home   Consults completed:  none at 0650.  Event Summary: Patient brought in by [facility] EMS from [facility]. Patient with unwitnessed fall this evening. Patient arrives alert and oriented x4. Recent d/c from hospital with T1 fx. On arrival, patient transferred from EMS stretcher to hospital stretcher. Manual BP obtained. Lab work obtained. Patient answering questions appropriately. Patient to CT with TRN. CT head and c-spine completed. Patient back to exam room at this time.   MTP Summary (If applicable):  N/A  Bedside handoff with ED RN Reita Cliche.    Sarah Wolf  Trauma Response RN  Please call TRN at [phone] for further assistance.

## 2023-06-25 NOTE — ED Notes (Signed)
PTAR called for transport.  

## 2023-06-25 NOTE — ED Triage Notes (Signed)
Patient arrived with [facility] from [facility] , level 2 trauma activation , unwitnessed fall this morning , staff found her on the floor in her room , she takes Eliquis , presents with right temporal hematoma/swelling . No LOC . Denies pain .

## 2023-06-25 NOTE — ED Notes (Signed)
This RN attempted to call [facility] twice to give report with no answer

## 2023-06-25 NOTE — Progress Notes (Signed)
   06/25/23 0500  Spiritual Encounters  Type of Visit Initial  Care provided to: Family  Referral source Trauma page  Reason for visit Trauma  OnCall Visit Yes   Chaplain responded to trauma level II. Patient's family was present. Chaplain provided support to family. No follow-up needed at this time.

## 2023-06-25 NOTE — ED Provider Notes (Signed)
[place] EMERGENCY DEPARTMENT AT [facility] Provider Note   CSN: [id number] Arrival date & time: 06/25/23  4098     History  Chief complaint: Fall  Sarah Wolf is a 88 y.o. female.  The history is provided by the patient and the EMS personnel.  She has history of hypertension, peripheral arterial disease, combined systolic and diastolic heart failure, atrial fibrillation, DVT, chronic anticoagulation on apixaban, chronic hypoxic respiratory failure on chronic oxygen and comes in following an unwitnessed fall in a nursing home where she resides.  Patient states that she remembers falling, did hit the right side of her forehead without loss of consciousness.  She denies weakness or numbness.  She does have a recent C-spine fracture.  She denies other injury.   Home Medications Prior to Admission medications   Medication Sig Start Date End Date Taking? Authorizing Provider  apixaban (ELIQUIS) 5 MG TABS tablet Take 1 tablet (5 mg total) by mouth 2 (two) times daily. 04/26/23   Ivonne Andrew, NP  atorvastatin (LIPITOR) 40 MG tablet Take 1 tablet (40 mg total) by mouth daily. 04/26/23   Ivonne Andrew, NP  HYDROcodone-acetaminophen (NORCO/VICODIN) 5-325 MG tablet Take 1 tablet by mouth every 6 (six) hours as needed for moderate pain (pain score 4-6) or severe pain (pain score 7-10). 06/09/23   Jonah Blue, MD  metoprolol tartrate (LOPRESSOR) 100 MG tablet Take 1 tablet (100 mg total) by mouth 2 (two) times daily. 04/26/23   Ivonne Andrew, NP  Multiple Vitamin (MULTIVITAMIN WITH MINERALS) TABS tablet Take 1 tablet by mouth daily. 06/09/23   Jonah Blue, MD  pantoprazole (PROTONIX) 40 MG tablet Take 1 tablet (40 mg total) by mouth 2 (two) times daily. 05/25/23 05/24/24  Armbruster, Willaim Rayas, MD  polyethylene glycol (MIRALAX / GLYCOLAX) 17 g packet Take 17 g by mouth daily as needed for mild constipation. 05/08/23   Rolly Salter, MD  sucralfate (CARAFATE) 1 GM/10ML  suspension Take 10 mLs (1 g total) by mouth every 6 (six) hours. 04/26/23   Ivonne Andrew, NP      Allergies    Penicillin g    Review of Systems   Review of Systems  All other systems reviewed and are negative.   Physical Exam Updated Vital Signs BP (!) 158/102 (BP Location: Right Arm)   Pulse 78   Temp 97.6 F (36.4 C) (Temporal)   Resp (!) 22   SpO2 100%  Physical Exam Vitals and nursing note reviewed.   88 year old female, resting comfortably and in no acute distress. Vital signs are significant for elevated blood pressure and borderline elevated respiratory rate. Oxygen saturation is 100%, which is normal. Head is normocephalic.  Ecchymosis is noted on the right side of the forehead. PERRLA, EOMI. Oropharynx is clear. Neck is immobilized in a stiff cervical collar with moderate tenderness diffusely. Back is tender in the upper thoracic region. Lungs are clear without rales, wheezes, or rhonchi. Chest is nontender. Heart has regular rate and rhythm without murmur. Abdomen is soft, flat, nontender. Extremities have no cyanosis or edema, full range of motion is present. Skin is warm and dry without rash. Neurologic: Mental status is normal, cranial nerves are intact, strength is 5/5 in all 4 extremities.   ED Results / Procedures / Treatments   Labs (all labs ordered are listed, but only abnormal results are displayed) Labs Reviewed  COMPREHENSIVE METABOLIC PANEL  ETHANOL  PROTIME-INR  CBC WITH DIFFERENTIAL/PLATELET  SAMPLE  TO BLOOD BANK    EKG None  Radiology No results found.  Procedures Procedures  Cardiac monitor shows atrial fibrillation, per my interpretation.  Medications Ordered in ED Medications  cefdinir (OMNICEF) capsule 300 mg (has no administration in time range)    ED Course/ Medical Decision Making/ A&P                                 Medical Decision Making Amount and/or Complexity of Data Reviewed Labs: ordered. Radiology:  ordered.  Risk Prescription drug management.   Fall inpatient on anticoagulants, level 2 trauma because of fall on anticoagulants.  Patient has recent C-spine fracture and also possible T1 fracture.  I reviewed her past records, and she had been hospitalized 06/05/2023-06/09/2023 following a fall with dens fracture as well as possible minimal superior endplate deformity of T1.  I have ordered trauma labs and imaging including CT of head and cervical spine.  CT scans show no intracranial injury, presence of forehead contusion noted on physical exam, stable fracture of dens and superior endplate of T1.  Have independently viewed all of the images, and agree with the radiologist's interpretation.  I have reviewed her laboratory tests, and my interpretation is borderline elevated random glucose level which will need to be followed as an outpatient, stable borderline renal insufficiency, stable anemia.  At this point, patient's son has arrived and states he was concerned about possible urinary tract infection.  I ordered a urinalysis.  I have reviewed the results and my interpretation is urinary tract infection with greater than 50 WBCs and many bacteria present on a catheterized specimen, positive nitrite.  I ordered a dose of cefdinir and I am discharging her with a prescription for cefpodoxime.  Instructed to follow-up with neurosurgery regarding her C2 fracture, return if she develops fever or other signs of sepsis.  CRITICAL CARE Performed by: Dione Booze Total critical care time: 55 minutes Critical care time was exclusive of separately billable procedures and treating other patients. Critical care was necessary to treat or prevent imminent or life-threatening deterioration. Critical care was time spent personally by me on the following activities: development of treatment plan with patient and/or surrogate as well as nursing, discussions with consultants, evaluation of patient's response to  treatment, examination of patient, obtaining history from patient or surrogate, ordering and performing treatments and interventions, ordering and review of laboratory studies, ordering and review of radiographic studies, pulse oximetry and re-evaluation of patient's condition.  Final Clinical Impression(s) / ED Diagnoses Final diagnoses:  Fall at nursing home, initial encounter  Forehead contusion, initial encounter  Urinary tract infection without hematuria, site unspecified  Normochromic normocytic anemia  Elevated random blood glucose level    Rx / DC Orders ED Discharge Orders          Ordered    cefpodoxime (VANTIN) 200 MG tablet  2 times daily        06/25/23 0716              Dione Booze, MD 06/25/23 0725

## 2023-06-26 ENCOUNTER — Telehealth: Payer: Self-pay

## 2023-06-26 DIAGNOSIS — F0390 Unspecified dementia without behavioral disturbance: Secondary | ICD-10-CM | POA: Diagnosis not present

## 2023-06-26 DIAGNOSIS — I5040 Unspecified combined systolic (congestive) and diastolic (congestive) heart failure: Secondary | ICD-10-CM | POA: Diagnosis not present

## 2023-06-26 DIAGNOSIS — S12110A Anterior displaced Type II dens fracture, initial encounter for closed fracture: Secondary | ICD-10-CM | POA: Diagnosis not present

## 2023-06-26 DIAGNOSIS — I4891 Unspecified atrial fibrillation: Secondary | ICD-10-CM | POA: Diagnosis not present

## 2023-06-26 NOTE — Transitions of Care (Post Inpatient/ED Visit) (Signed)
   06/26/2023  Name: Sarah Wolf MRN: [id number] DOB: 10-12-1934  Today's TOC FU Call Status: Today's TOC FU Call Status:: Successful TOC FU Call Completed TOC FU Call Complete Date: 06/26/23 Patient's Name and Date of Birth confirmed.  Transition Care Management Follow-up Telephone Call Date of Discharge: 06/25/23 Discharge Facility: [facility]) Type of Discharge: Emergency Department Reason for ED Visit: Other: (anemia) How have you been since you were released from the hospital?: Same Any questions or concerns?: No  Items Reviewed: Did you receive and understand the discharge instructions provided?: Yes Medications obtained,verified, and reconciled?: Yes (Medications Reviewed) Any new allergies since your discharge?: No Dietary orders reviewed?: Yes Do you have support at home?: Yes People in Home: facility resident  Medications Reviewed Today: Medications Reviewed Today     Reviewed by Karena Addison, LPN (Licensed Practical Nurse) on 06/26/23 at 1456  Med List Status: <None>   Medication Order Taking? Sig Documenting Provider Last Dose Status Informant  apixaban (ELIQUIS) 5 MG TABS tablet [id number] No Take 1 tablet (5 mg total) by mouth 2 (two) times daily. Ivonne Andrew, NP Taking Active Pharmacy Records, Self  atorvastatin (LIPITOR) 40 MG tablet [id number] No Take 1 tablet (40 mg total) by mouth daily. Ivonne Andrew, NP Taking Active Pharmacy Records, Self  cefpodoxime Varney Baas) 200 MG tablet [id number]  Take 1 tablet (200 mg total) by mouth 2 (two) times daily. Dione Booze, MD  Active   HYDROcodone-acetaminophen (NORCO/VICODIN) 5-325 MG tablet [id number] No Take 1 tablet by mouth every 6 (six) hours as needed for moderate pain (pain score 4-6) or severe pain (pain score 7-10). Jonah Blue, MD Taking Active   metoprolol tartrate (LOPRESSOR) 100 MG tablet [id number] No Take 1 tablet (100 mg total) by mouth 2 (two) times daily. Ivonne Andrew, NP Taking Active  Pharmacy Records, Self  Multiple Vitamin (MULTIVITAMIN WITH MINERALS) TABS tablet [id number] No Take 1 tablet by mouth daily. Jonah Blue, MD Taking Active   pantoprazole (PROTONIX) 40 MG tablet [id number] No Take 1 tablet (40 mg total) by mouth 2 (two) times daily. Armbruster, Willaim Rayas, MD Taking Active Self, Pharmacy Records  polyethylene glycol (MIRALAX / GLYCOLAX) 17 g packet [id number] No Take 17 g by mouth daily as needed for mild constipation. Rolly Salter, MD Taking Active Self, Pharmacy Records  sucralfate (CARAFATE) 1 GM/10ML suspension [id number] No Take 10 mLs (1 g total) by mouth every 6 (six) hours. Ivonne Andrew, NP Taking Active Pharmacy Records, Self            Home Care and Equipment/Supplies: Were Home Health Services Ordered?: NA Any new equipment or medical supplies ordered?: NA  Functional Questionnaire: Do you need assistance with bathing/showering or dressing?: Yes Do you need assistance with meal preparation?: Yes Do you need assistance with eating?: No Do you have difficulty maintaining continence: Yes Do you need assistance with getting out of bed/getting out of a chair/moving?: Yes Do you have difficulty managing or taking your medications?: Yes  Follow up appointments reviewed: PCP Follow-up appointment confirmed?: Yes Date of PCP follow-up appointment?: 06/29/23 Follow-up Provider: [facility] Follow-up appointment confirmed?: NA Do you need transportation to your follow-up appointment?: No Do you understand care options if your condition(s) worsen?: Yes-patient verbalized understanding    SIGNATURE Karena Addison, LPN [facility] Nurse Health Advisor Direct Dial [phone]

## 2023-06-27 DIAGNOSIS — I3139 Other pericardial effusion (noninflammatory): Secondary | ICD-10-CM | POA: Diagnosis not present

## 2023-06-27 DIAGNOSIS — R278 Other lack of coordination: Secondary | ICD-10-CM | POA: Diagnosis not present

## 2023-06-27 DIAGNOSIS — R2681 Unsteadiness on feet: Secondary | ICD-10-CM | POA: Diagnosis not present

## 2023-06-27 DIAGNOSIS — M6281 Muscle weakness (generalized): Secondary | ICD-10-CM | POA: Diagnosis not present

## 2023-06-27 LAB — URINE CULTURE: Culture: >=100000 — AB

## 2023-06-28 ENCOUNTER — Telehealth (HOSPITAL_BASED_OUTPATIENT_CLINIC_OR_DEPARTMENT_OTHER): Payer: Self-pay | Admitting: *Deleted

## 2023-06-28 NOTE — Progress Notes (Signed)
ED Antimicrobial Stewardship Positive Culture Follow Up   Sarah Wolf is an 88 y.o. female who presented to [facility] on 06/25/2023 with a chief complaint of  Chief Complaint  Patient presents with   Level 2: Fall ; Taking Eliquis / Head Injury    Recent Results (from the past 720 hour(s))  Urine Culture     Status: Abnormal   Collection Time: 06/25/23  5:55 AM   Specimen: Urine, Random  Result Value Ref Range Status   Specimen Description URINE, RANDOM  Final   Special Requests   Final    NONE Reflexed from [id number] Performed at [facility], [address] [place], [place] [address]    Culture >=100,000 COLONIES/mL ENTEROCOCCUS FAECALIS (A)  Final   Report Status 06/27/2023 FINAL  Final   Organism ID, Bacteria ENTEROCOCCUS FAECALIS (A)  Final      Susceptibility   Enterococcus faecalis - MIC*    AMPICILLIN <=2 SENSITIVE Sensitive     NITROFURANTOIN <=16 SENSITIVE Sensitive     VANCOMYCIN 1 SENSITIVE Sensitive     * >=100,000 COLONIES/mL ENTEROCOCCUS FAECALIS    [x]  Treated with cefpodoxime, organism resistant to prescribed antimicrobial []  Patient discharged originally without antimicrobial agent and treatment is now indicated  New antibiotic prescription: Fosfomycin  ED Provider: Margarita Grizzle, MD   [name] 06/28/2023, 7:44 AM Clinical Pharmacist Monday - Friday phone -  [phone] Saturday - Sunday phone - [phone]

## 2023-06-28 NOTE — Telephone Encounter (Signed)
  Post ED Visit - Positive Culture Follow-up: Successful Patient Follow-Up  Culture assessed and recommendations reviewed by:  [x]  [name] Pharm.D. []  Celedonio Miyamoto, Pharm.D., BCPS AQ-ID []  Garvin Fila, Pharm.D., BCPS []  Georgina Pillion, [address].D., BCPS []  [place], [address].D., BCPS, AAHIVP []  Estella Husk, Pharm.D., BCPS, AAHIVP []  Lysle Pearl, PharmD, BCPS []  Phillips Climes, PharmD, BCPS []  Agapito Games, PharmD, BCPS []  Verlan Friends, PharmD  Positive urine culture  []  Patient discharged without antimicrobial prescription and treatment is now indicated [x]  Organism is resistant to prescribed ED discharge antimicrobial []  Patient with positive blood cultures  Changes discussed with ED provider: Margarita Grizzle New antibiotic prescription Fax to [phone] at [facility] LPN , date 03/47/42, time 1245   Bing Quarry 06/28/2023, 12:43 PM

## 2023-06-29 ENCOUNTER — Ambulatory Visit (INDEPENDENT_AMBULATORY_CARE_PROVIDER_SITE_OTHER): Payer: Medicare Other | Admitting: Nurse Practitioner

## 2023-06-29 ENCOUNTER — Encounter: Payer: Self-pay | Admitting: Nurse Practitioner

## 2023-06-29 VITALS — BP 118/64 | HR 103 | Temp 96.6°F | Wt 133.9 lb

## 2023-06-29 DIAGNOSIS — D649 Anemia, unspecified: Secondary | ICD-10-CM | POA: Diagnosis not present

## 2023-06-29 NOTE — Progress Notes (Signed)
Subjective   Patient ID: Sarah Wolf, female    DOB: 1935/04/15, 88 y.o.   MRN: [id number]  Chief Complaint  Patient presents with   Follow-up    Hospital follow up    Referring provider: Ivonne Andrew, NP  [place] L Deming is a 88 y.o. female with Past Medical History: No date: Atherosclerosis of native arteries of extremities with rest  pain, right leg (HCC) No date: CHF (congestive heart failure) (HCC) No date: Diaphragmatic hernia without obstruction or gangrene No date: GERD (gastroesophageal reflux disease) No date: History of DVT (deep vein thrombosis) No date: Hypertension No date: Peripheral arterial disease (HCC) No date: Persistent atrial fibrillation (HCC) No date: Personal history of peptic ulcer disease No date: Type 2 diabetes mellitus with diabetic peripheral angiopathy  without gangrene (HCC) No date: Unspecified hearing loss, bilateral  Recent significant events:  Hospital admission: 06/05/23  Hospital summary:  88yo with h/o chronic combined CHF, HTN, afib on Eliquis with h/o DVT, HTN, PAD s/p R SFA stent, and chronic hypoxic respiratory failure due to large HH who presented on 11/18 with recurrent falls.  Imaging on presentation showed large pericardial effusion that has increased in size from prior; echo pending.  Also with Dens fracture; neurosurgery recommends outpatient follow up and C-collar.  Also with L pleural effusion, not enough fluid for thoracentesis per IR.   Assessment and Plan:   Fall at home  Per nephew, she has been falling at home and needs placement Dens fracture with current fall She is willing to agree to placement at this time Family was hoping that she could go directly to ALF at [facility] with PT/OT there but her needs are too great She has been accepted at [facility] for rehab and will discharge to there today Quantiferon gold ordered   Pericardial effusion CT imaging showing large pericardial effusion that is probably  increased from chest CT done a few months ago Last echocardiogram on 05/05/2023 showed trivial pericardial effusion.   Repeat echocardiogram ordered and showed small pericardial effusion She denies any chest pain or sob She does not appear to need further evaluation or intervention for this issue currently   Dens fracture  Minimally displaced type 3 dens fracture. On the left the fracture extends into the left transverse foramen. No vascular injury on CTA neck Neurosurgery was consulted and recommended follow-up outpatient in a week Continue c-collar. Pain control.  Therapy evaluations ordered and discussed with family at bedside, plan for snf today   Left Pleural effusion Patient with history of combined systolic and diastolic heart failure  CT shows large left pleural effusion.  IR consulted for thoracentesis, but surprisingly there is only scant fluid not amenable for thoracentesis.  She is currently on 2L [place] O2 (she wears nocturnal O2 at home)   Stage 3a CKD Slightly worse than baseline today Encourage PO intake Hold Lasix Given 250 cc bolus with return to baseline   Persistent atrial fibrillation  Rate better controlled with pain control, tachycardia has resolved Continue metoprolol Patient on amiodarone, which was discontinued earlier this month due to thyroid issues Resumed Eliquis as thoracentesis couldn't be done   History of dysphagia Continue dysphagia 1 diet as per SLP recommendations    Abnormal TSH Patient with abnormal TSH on 11/6 thought potentially to be amiodarone induced.  Has discontinued amiodarone since 11/8 TSH elevated  at 55 but improved from 68.  Free t4 is low at 0.38.  Recommend checking thyroid panel in 4  to 6 weeks Thyroid US ordered but will cancel (can't be done with collar on) - recommend outpatient f/u    Chronic combined systolic (congestive) and diastolic (congestive) heart failure  05/05/2023- EF of 50-55% with global hypokinesis. LV  diastolic indeterminate. Moderately elevated pulmonary artery systolic pressure. Small pericardial effusion. Trivial MV regurgitation. Mild to moderate TV regurgitation Hold lasix for now and monitor   Chronic hypoxic respiratory failure  Thought secondary to large hiatal hernia Currently remains on 2 L via nasal cannula Reports only wearing nocturnal O2 at home, ?compliance   Pure hypercholesterolemia Continue statin   PAD (peripheral artery disease)  S/p right SFA stent Resume eliquis   Dementia Concern for hospital delirium vs neck pain vs from pain meds - this is significantly improved Prn haldol Delirium precautions Nephew reports some MCI at home, likely sundowning and delirium associated   DNR Reviewed on admission  Out of facility form signed   HPI  Patient presents today for a follow-up visit.  Please see notes above.  Patient is currently on O2 and has neck brace in place from recent fall from rehab center.  After she was discharged from the hospital she did fall again at the rehab facility and returned to the emergency room.  We wanted to recheck labs today patient will remain need to follow-up with cardiology. Denies f/c/s, n/v/d, hemoptysis, PND, leg swelling Denies chest pain or edema     Allergies  Allergen Reactions   Penicillin G Hives    There is no immunization history for the selected administration types on file for this patient.  Tobacco History: Social History   Tobacco Use  Smoking Status Never  Smokeless Tobacco Never   Counseling given: Not Answered   Outpatient Encounter Medications as of 06/29/2023  Medication Sig   apixaban (ELIQUIS) 5 MG TABS tablet Take 1 tablet (5 mg total) by mouth 2 (two) times daily.   atorvastatin (LIPITOR) 40 MG tablet Take 1 tablet (40 mg total) by mouth daily.   HYDROcodone-acetaminophen (NORCO/VICODIN) 5-325 MG tablet Take 1 tablet by mouth every 6 (six) hours as needed for moderate pain (pain score 4-6)  or severe pain (pain score 7-10).   metoprolol tartrate (LOPRESSOR) 100 MG tablet Take 1 tablet (100 mg total) by mouth 2 (two) times daily.   Multiple Vitamin (MULTIVITAMIN WITH MINERALS) TABS tablet Take 1 tablet by mouth daily.   pantoprazole (PROTONIX) 40 MG tablet Take 1 tablet (40 mg total) by mouth 2 (two) times daily.   polyethylene glycol (MIRALAX / GLYCOLAX) 17 g packet Take 17 g by mouth daily as needed for mild constipation.   potassium chloride SA (KLOR-CON M) 20 MEQ tablet Take 20 mEq by mouth daily.   sucralfate (CARAFATE) 1 GM/10ML suspension Take 10 mLs (1 g total) by mouth every 6 (six) hours.   cefpodoxime (VANTIN) 200 MG tablet Take 1 tablet (200 mg total) by mouth 2 (two) times daily. (Patient not taking: Reported on 06/29/2023)   No facility-administered encounter medications on file as of 06/29/2023.    Review of Systems  Review of Systems  Constitutional: Negative.   HENT: Negative.    Cardiovascular: Negative.   Gastrointestinal: Negative.   Allergic/Immunologic: Negative.   Neurological: Negative.   Psychiatric/Behavioral: Negative.       Objective:   BP 118/64   Pulse (!) 103   Temp (!) 96.6 F (35.9 C)   Wt 133 lb 14.4 oz (60.7 kg)   SpO2 (!) 80%   BMI  22.28 kg/m   Wt Readings from Last 5 Encounters:  06/29/23 133 lb 14.4 oz (60.7 kg)  06/14/23 133 lb (60.3 kg)  06/08/23 133 lb 4.8 oz (60.5 kg)  05/30/23 140 lb (63.5 kg)  05/25/23 142 lb (64.4 kg)     Physical Exam Vitals and nursing note reviewed.  Constitutional:      General: She is not in acute distress.    Appearance: She is well-developed.  Cardiovascular:     Rate and Rhythm: Normal rate and regular rhythm.  Pulmonary:     Effort: Pulmonary effort is normal.     Breath sounds: Normal breath sounds.  Neurological:     Mental Status: She is alert and oriented to person, place, and time.       Assessment & Plan:   Anemia, unspecified type -     CBC     Return in about  3 months (around 09/27/2023).   Ivonne Andrew, NP 06/29/2023

## 2023-06-29 NOTE — Patient Instructions (Signed)
1. Anemia, unspecified type (Primary)  - CBC  Follow up:  Follow up in 3 months

## 2023-06-30 DIAGNOSIS — R2681 Unsteadiness on feet: Secondary | ICD-10-CM | POA: Diagnosis not present

## 2023-06-30 DIAGNOSIS — R278 Other lack of coordination: Secondary | ICD-10-CM | POA: Diagnosis not present

## 2023-06-30 DIAGNOSIS — I3139 Other pericardial effusion (noninflammatory): Secondary | ICD-10-CM | POA: Diagnosis not present

## 2023-06-30 DIAGNOSIS — M6281 Muscle weakness (generalized): Secondary | ICD-10-CM | POA: Diagnosis not present

## 2023-06-30 LAB — CBC
Hematocrit: 33.8 % — ABNORMAL LOW (ref 34.0–46.6)
Hemoglobin: 10.4 g/dL — ABNORMAL LOW (ref 11.1–15.9)
MCH: 29.9 pg (ref 26.6–33.0)
MCHC: 30.8 g/dL — ABNORMAL LOW (ref 31.5–35.7)
MCV: 97 fL (ref 79–97)
Platelets: 232 10*3/uL (ref 150–450)
RBC: 3.48 x10E6/uL — ABNORMAL LOW (ref 3.77–5.28)
RDW: 17.5 % — ABNORMAL HIGH (ref 11.7–15.4)
WBC: 6 10*3/uL (ref 3.4–10.8)

## 2023-07-03 DIAGNOSIS — R278 Other lack of coordination: Secondary | ICD-10-CM | POA: Diagnosis not present

## 2023-07-03 DIAGNOSIS — F0390 Unspecified dementia without behavioral disturbance: Secondary | ICD-10-CM | POA: Diagnosis not present

## 2023-07-03 DIAGNOSIS — R2681 Unsteadiness on feet: Secondary | ICD-10-CM | POA: Diagnosis not present

## 2023-07-03 DIAGNOSIS — I3139 Other pericardial effusion (noninflammatory): Secondary | ICD-10-CM | POA: Diagnosis not present

## 2023-07-03 DIAGNOSIS — S12110A Anterior displaced Type II dens fracture, initial encounter for closed fracture: Secondary | ICD-10-CM | POA: Diagnosis not present

## 2023-07-03 DIAGNOSIS — M6259 Muscle wasting and atrophy, not elsewhere classified, multiple sites: Secondary | ICD-10-CM | POA: Diagnosis not present

## 2023-07-03 DIAGNOSIS — I4891 Unspecified atrial fibrillation: Secondary | ICD-10-CM | POA: Diagnosis not present

## 2023-07-03 DIAGNOSIS — M6281 Muscle weakness (generalized): Secondary | ICD-10-CM | POA: Diagnosis not present

## 2023-07-03 DIAGNOSIS — I5040 Unspecified combined systolic (congestive) and diastolic (congestive) heart failure: Secondary | ICD-10-CM | POA: Diagnosis not present

## 2023-07-04 DIAGNOSIS — M6281 Muscle weakness (generalized): Secondary | ICD-10-CM | POA: Diagnosis not present

## 2023-07-04 DIAGNOSIS — F0390 Unspecified dementia without behavioral disturbance: Secondary | ICD-10-CM | POA: Diagnosis not present

## 2023-07-04 DIAGNOSIS — I3139 Other pericardial effusion (noninflammatory): Secondary | ICD-10-CM | POA: Diagnosis not present

## 2023-07-04 DIAGNOSIS — R296 Repeated falls: Secondary | ICD-10-CM | POA: Diagnosis not present

## 2023-07-04 DIAGNOSIS — S12120A Other displaced dens fracture, initial encounter for closed fracture: Secondary | ICD-10-CM | POA: Diagnosis not present

## 2023-07-04 DIAGNOSIS — M6259 Muscle wasting and atrophy, not elsewhere classified, multiple sites: Secondary | ICD-10-CM | POA: Diagnosis not present

## 2023-07-04 DIAGNOSIS — R2681 Unsteadiness on feet: Secondary | ICD-10-CM | POA: Diagnosis not present

## 2023-07-04 DIAGNOSIS — R278 Other lack of coordination: Secondary | ICD-10-CM | POA: Diagnosis not present

## 2023-07-04 DIAGNOSIS — S12110D Anterior displaced Type II dens fracture, subsequent encounter for fracture with routine healing: Secondary | ICD-10-CM | POA: Diagnosis not present

## 2023-07-05 DIAGNOSIS — F0390 Unspecified dementia without behavioral disturbance: Secondary | ICD-10-CM | POA: Diagnosis not present

## 2023-07-05 DIAGNOSIS — S12110A Anterior displaced Type II dens fracture, initial encounter for closed fracture: Secondary | ICD-10-CM | POA: Diagnosis not present

## 2023-07-05 DIAGNOSIS — I4891 Unspecified atrial fibrillation: Secondary | ICD-10-CM | POA: Diagnosis not present

## 2023-07-05 DIAGNOSIS — I5040 Unspecified combined systolic (congestive) and diastolic (congestive) heart failure: Secondary | ICD-10-CM | POA: Diagnosis not present

## 2023-07-06 DIAGNOSIS — R296 Repeated falls: Secondary | ICD-10-CM | POA: Diagnosis not present

## 2023-07-06 DIAGNOSIS — S12121S Other nondisplaced dens fracture, sequela: Secondary | ICD-10-CM | POA: Diagnosis not present

## 2023-07-07 DIAGNOSIS — R278 Other lack of coordination: Secondary | ICD-10-CM | POA: Diagnosis not present

## 2023-07-07 DIAGNOSIS — R2689 Other abnormalities of gait and mobility: Secondary | ICD-10-CM | POA: Diagnosis not present

## 2023-07-07 DIAGNOSIS — R296 Repeated falls: Secondary | ICD-10-CM | POA: Diagnosis not present

## 2023-07-07 DIAGNOSIS — M6259 Muscle wasting and atrophy, not elsewhere classified, multiple sites: Secondary | ICD-10-CM | POA: Diagnosis not present

## 2023-07-10 ENCOUNTER — Encounter: Payer: Self-pay | Admitting: Podiatry

## 2023-07-10 ENCOUNTER — Ambulatory Visit (INDEPENDENT_AMBULATORY_CARE_PROVIDER_SITE_OTHER): Payer: Medicare Other | Admitting: Podiatry

## 2023-07-10 VITALS — Ht 65.0 in | Wt 133.9 lb

## 2023-07-10 DIAGNOSIS — M79674 Pain in right toe(s): Secondary | ICD-10-CM

## 2023-07-10 DIAGNOSIS — F02818 Dementia in other diseases classified elsewhere, unspecified severity, with other behavioral disturbance: Secondary | ICD-10-CM | POA: Diagnosis not present

## 2023-07-10 DIAGNOSIS — M6259 Muscle wasting and atrophy, not elsewhere classified, multiple sites: Secondary | ICD-10-CM | POA: Diagnosis not present

## 2023-07-10 DIAGNOSIS — R2689 Other abnormalities of gait and mobility: Secondary | ICD-10-CM | POA: Diagnosis not present

## 2023-07-10 DIAGNOSIS — G301 Alzheimer's disease with late onset: Secondary | ICD-10-CM | POA: Diagnosis not present

## 2023-07-10 DIAGNOSIS — I130 Hypertensive heart and chronic kidney disease with heart failure and stage 1 through stage 4 chronic kidney disease, or unspecified chronic kidney disease: Secondary | ICD-10-CM | POA: Diagnosis not present

## 2023-07-10 DIAGNOSIS — Q828 Other specified congenital malformations of skin: Secondary | ICD-10-CM | POA: Diagnosis not present

## 2023-07-10 DIAGNOSIS — M79675 Pain in left toe(s): Secondary | ICD-10-CM

## 2023-07-10 DIAGNOSIS — R278 Other lack of coordination: Secondary | ICD-10-CM | POA: Diagnosis not present

## 2023-07-10 DIAGNOSIS — R296 Repeated falls: Secondary | ICD-10-CM | POA: Diagnosis not present

## 2023-07-10 DIAGNOSIS — N1831 Chronic kidney disease, stage 3a: Secondary | ICD-10-CM | POA: Diagnosis not present

## 2023-07-10 DIAGNOSIS — B351 Tinea unguium: Secondary | ICD-10-CM | POA: Diagnosis not present

## 2023-07-10 NOTE — Progress Notes (Signed)
This patient presents to the office with chief complaint of long thick painful nails.  Patient says the nails are painful walking and wearing shoes.  This patient is unable to self treat.  This patient is unable to trim her nails since she is unable to reach her nails.  She presents to the office with her nephew. She presents to the office for preventative foot care services.  General Appearance  Alert, conversant and in no acute stress.  Vascular  Dorsalis pedis and posterior tibial  pulses are  weakly palpable  bilaterally.  Capillary return is within normal limits  bilaterally. Temperature is within normal limits  bilaterally.  Neurologic  Senn-Weinstein monofilament wire test within normal limits  bilaterally. Muscle power within normal limits bilaterally.  Nails Thick disfigured discolored nails with subungual debris  from hallux to fifth toes bilaterally. No evidence of bacterial infection or drainage bilaterally.  Orthopedic  No limitations of motion  feet .  No crepitus or effusions noted.  No bony pathology or digital deformities noted.  Skin  normotropic skin with no porokeratosis noted bilaterally.  No signs of infections or ulcers noted. Porokeratosis sub 4 left foot.     Onychomycosis  Nails  B/L.  Pain in right toes  Pain in left toes  Porokeratosis left foot.  Debridement of nails both feet followed trimming the nails with dremel tool.  Debride porokeratosis with # 15 blade and dremel tool.   RTC 3 months.   Helane Gunther DPM

## 2023-07-11 DIAGNOSIS — R296 Repeated falls: Secondary | ICD-10-CM | POA: Diagnosis not present

## 2023-07-11 DIAGNOSIS — S12121S Other nondisplaced dens fracture, sequela: Secondary | ICD-10-CM | POA: Diagnosis not present

## 2023-07-13 ENCOUNTER — Ambulatory Visit: Payer: Medicare Other | Admitting: Podiatry

## 2023-07-13 DIAGNOSIS — R2689 Other abnormalities of gait and mobility: Secondary | ICD-10-CM | POA: Diagnosis not present

## 2023-07-13 DIAGNOSIS — M6259 Muscle wasting and atrophy, not elsewhere classified, multiple sites: Secondary | ICD-10-CM | POA: Diagnosis not present

## 2023-07-13 DIAGNOSIS — R278 Other lack of coordination: Secondary | ICD-10-CM | POA: Diagnosis not present

## 2023-07-13 DIAGNOSIS — R296 Repeated falls: Secondary | ICD-10-CM | POA: Diagnosis not present

## 2023-07-14 DIAGNOSIS — S12121S Other nondisplaced dens fracture, sequela: Secondary | ICD-10-CM | POA: Diagnosis not present

## 2023-07-14 DIAGNOSIS — R296 Repeated falls: Secondary | ICD-10-CM | POA: Diagnosis not present

## 2023-07-17 ENCOUNTER — Emergency Department (HOSPITAL_COMMUNITY)
Admission: EM | Admit: 2023-07-17 | Discharge: 2023-07-18 | Disposition: A | Discharge status: Home / Self Care | Payer: Medicare Other | Attending: Emergency Medicine | Admitting: Emergency Medicine

## 2023-07-17 ENCOUNTER — Encounter (HOSPITAL_COMMUNITY): Payer: Self-pay

## 2023-07-17 ENCOUNTER — Emergency Department (HOSPITAL_COMMUNITY): Payer: Medicare Other

## 2023-07-17 ENCOUNTER — Other Ambulatory Visit: Payer: Self-pay

## 2023-07-17 DIAGNOSIS — E119 Type 2 diabetes mellitus without complications: Secondary | ICD-10-CM | POA: Insufficient documentation

## 2023-07-17 DIAGNOSIS — Z043 Encounter for examination and observation following other accident: Secondary | ICD-10-CM | POA: Diagnosis not present

## 2023-07-17 DIAGNOSIS — R278 Other lack of coordination: Secondary | ICD-10-CM | POA: Diagnosis not present

## 2023-07-17 DIAGNOSIS — W19XXXA Unspecified fall, initial encounter: Secondary | ICD-10-CM | POA: Diagnosis not present

## 2023-07-17 DIAGNOSIS — Z79899 Other long term (current) drug therapy: Secondary | ICD-10-CM | POA: Insufficient documentation

## 2023-07-17 DIAGNOSIS — I10 Essential (primary) hypertension: Secondary | ICD-10-CM | POA: Diagnosis not present

## 2023-07-17 DIAGNOSIS — M47814 Spondylosis without myelopathy or radiculopathy, thoracic region: Secondary | ICD-10-CM | POA: Diagnosis not present

## 2023-07-17 DIAGNOSIS — I110 Hypertensive heart disease with heart failure: Secondary | ICD-10-CM | POA: Insufficient documentation

## 2023-07-17 DIAGNOSIS — I509 Heart failure, unspecified: Secondary | ICD-10-CM | POA: Insufficient documentation

## 2023-07-17 DIAGNOSIS — M546 Pain in thoracic spine: Secondary | ICD-10-CM | POA: Insufficient documentation

## 2023-07-17 DIAGNOSIS — M6259 Muscle wasting and atrophy, not elsewhere classified, multiple sites: Secondary | ICD-10-CM | POA: Diagnosis not present

## 2023-07-17 DIAGNOSIS — M549 Dorsalgia, unspecified: Secondary | ICD-10-CM | POA: Diagnosis not present

## 2023-07-17 DIAGNOSIS — Z7901 Long term (current) use of anticoagulants: Secondary | ICD-10-CM | POA: Diagnosis not present

## 2023-07-17 DIAGNOSIS — R2689 Other abnormalities of gait and mobility: Secondary | ICD-10-CM | POA: Diagnosis not present

## 2023-07-17 DIAGNOSIS — S199XXA Unspecified injury of neck, initial encounter: Secondary | ICD-10-CM | POA: Diagnosis not present

## 2023-07-17 DIAGNOSIS — I700 Atherosclerosis of aorta: Secondary | ICD-10-CM | POA: Diagnosis not present

## 2023-07-17 DIAGNOSIS — S0990XA Unspecified injury of head, initial encounter: Secondary | ICD-10-CM | POA: Diagnosis not present

## 2023-07-17 DIAGNOSIS — S80919A Unspecified superficial injury of unspecified knee, initial encounter: Secondary | ICD-10-CM | POA: Diagnosis not present

## 2023-07-17 DIAGNOSIS — R296 Repeated falls: Secondary | ICD-10-CM | POA: Diagnosis not present

## 2023-07-17 DIAGNOSIS — M25562 Pain in left knee: Secondary | ICD-10-CM | POA: Diagnosis not present

## 2023-07-17 NOTE — ED Triage Notes (Signed)
Patient brought in via EMS for fall on thinners from [employer]. She is on Eliquis. Per EMS she fell prior and is supposed to wear a C-collar. She is complaining of back pain and left knee pain. She is on O2 at Baseline  HR 78 99% 2L 134 CBG

## 2023-07-18 ENCOUNTER — Emergency Department (HOSPITAL_COMMUNITY): Payer: Medicare Other

## 2023-07-18 DIAGNOSIS — M47814 Spondylosis without myelopathy or radiculopathy, thoracic region: Secondary | ICD-10-CM | POA: Diagnosis not present

## 2023-07-18 DIAGNOSIS — M549 Dorsalgia, unspecified: Secondary | ICD-10-CM | POA: Diagnosis not present

## 2023-07-18 DIAGNOSIS — I700 Atherosclerosis of aorta: Secondary | ICD-10-CM | POA: Diagnosis not present

## 2023-07-18 DIAGNOSIS — R296 Repeated falls: Secondary | ICD-10-CM | POA: Diagnosis not present

## 2023-07-18 DIAGNOSIS — Z043 Encounter for examination and observation following other accident: Secondary | ICD-10-CM | POA: Diagnosis not present

## 2023-07-18 DIAGNOSIS — S199XXA Unspecified injury of neck, initial encounter: Secondary | ICD-10-CM | POA: Diagnosis not present

## 2023-07-18 DIAGNOSIS — S12121S Other nondisplaced dens fracture, sequela: Secondary | ICD-10-CM | POA: Diagnosis not present

## 2023-07-18 DIAGNOSIS — S0990XA Unspecified injury of head, initial encounter: Secondary | ICD-10-CM | POA: Diagnosis not present

## 2023-07-18 NOTE — ED Notes (Signed)
Attempted to call Sarah Wolf three times and was unable to get through. The patient's nephew tried as well and was unsuccessful.

## 2023-07-18 NOTE — Discharge Instructions (Addendum)
Please leave your neck collar in place to keep the neck fracture stable.

## 2023-07-19 ENCOUNTER — Other Ambulatory Visit: Payer: Self-pay

## 2023-07-19 ENCOUNTER — Emergency Department (HOSPITAL_COMMUNITY): Payer: Medicare Other

## 2023-07-19 ENCOUNTER — Emergency Department (HOSPITAL_COMMUNITY)
Admission: EM | Admit: 2023-07-19 | Discharge: 2023-07-19 | Disposition: A | Discharge status: Home / Self Care | Payer: Medicare Other | Attending: Emergency Medicine | Admitting: Emergency Medicine

## 2023-07-19 DIAGNOSIS — S0011XA Contusion of right eyelid and periocular area, initial encounter: Secondary | ICD-10-CM | POA: Insufficient documentation

## 2023-07-19 DIAGNOSIS — Z7901 Long term (current) use of anticoagulants: Secondary | ICD-10-CM | POA: Insufficient documentation

## 2023-07-19 DIAGNOSIS — W01198D Fall on same level from slipping, tripping and stumbling with subsequent striking against other object, subsequent encounter: Secondary | ICD-10-CM | POA: Diagnosis not present

## 2023-07-19 DIAGNOSIS — S22019A Unspecified fracture of first thoracic vertebra, initial encounter for closed fracture: Secondary | ICD-10-CM | POA: Diagnosis not present

## 2023-07-19 DIAGNOSIS — S1010XD Unspecified superficial injuries of throat, subsequent encounter: Secondary | ICD-10-CM | POA: Diagnosis present

## 2023-07-19 DIAGNOSIS — I110 Hypertensive heart disease with heart failure: Secondary | ICD-10-CM | POA: Diagnosis not present

## 2023-07-19 DIAGNOSIS — S12120G Other displaced dens fracture, subsequent encounter for fracture with delayed healing: Secondary | ICD-10-CM | POA: Diagnosis not present

## 2023-07-19 DIAGNOSIS — S12101A Unspecified nondisplaced fracture of second cervical vertebra, initial encounter for closed fracture: Secondary | ICD-10-CM | POA: Diagnosis not present

## 2023-07-19 DIAGNOSIS — R930 Abnormal findings on diagnostic imaging of skull and head, not elsewhere classified: Secondary | ICD-10-CM | POA: Diagnosis not present

## 2023-07-19 DIAGNOSIS — S12112G Nondisplaced Type II dens fracture, subsequent encounter for fracture with delayed healing: Secondary | ICD-10-CM | POA: Diagnosis not present

## 2023-07-19 DIAGNOSIS — S0590XA Unspecified injury of unspecified eye and orbit, initial encounter: Secondary | ICD-10-CM | POA: Diagnosis not present

## 2023-07-19 DIAGNOSIS — S12100G Unspecified displaced fracture of second cervical vertebra, subsequent encounter for fracture with delayed healing: Secondary | ICD-10-CM

## 2023-07-19 DIAGNOSIS — E119 Type 2 diabetes mellitus without complications: Secondary | ICD-10-CM | POA: Insufficient documentation

## 2023-07-19 DIAGNOSIS — I509 Heart failure, unspecified: Secondary | ICD-10-CM | POA: Diagnosis not present

## 2023-07-19 DIAGNOSIS — Z79899 Other long term (current) drug therapy: Secondary | ICD-10-CM | POA: Diagnosis not present

## 2023-07-19 DIAGNOSIS — M542 Cervicalgia: Secondary | ICD-10-CM | POA: Diagnosis not present

## 2023-07-19 DIAGNOSIS — I10 Essential (primary) hypertension: Secondary | ICD-10-CM | POA: Diagnosis not present

## 2023-07-19 DIAGNOSIS — I4891 Unspecified atrial fibrillation: Secondary | ICD-10-CM | POA: Insufficient documentation

## 2023-07-19 DIAGNOSIS — W19XXXA Unspecified fall, initial encounter: Secondary | ICD-10-CM

## 2023-07-19 DIAGNOSIS — G319 Degenerative disease of nervous system, unspecified: Secondary | ICD-10-CM | POA: Diagnosis not present

## 2023-07-19 DIAGNOSIS — S0083XA Contusion of other part of head, initial encounter: Secondary | ICD-10-CM | POA: Diagnosis not present

## 2023-07-19 LAB — BASIC METABOLIC PANEL
Anion gap: 9 (ref 5–15)
BUN: 18 mg/dL (ref 8–23)
CO2: 30 mmol/L (ref 22–32)
Calcium: 9.3 mg/dL (ref 8.9–10.3)
Chloride: 97 mmol/L — ABNORMAL LOW (ref 98–111)
Creatinine, Ser: 0.91 mg/dL (ref 0.44–1.00)
GFR, Estimated: >60 mL/min (ref >60)
Glucose, Bld: 112 mg/dL — ABNORMAL HIGH (ref 70–99)
Potassium: 3.5 mmol/L (ref 3.5–5.1)
Sodium: 136 mmol/L (ref 135–145)

## 2023-07-19 LAB — URINALYSIS, ROUTINE W REFLEX MICROSCOPIC
Bilirubin Urine: NEGATIVE
Glucose, UA: NEGATIVE mg/dL
Hgb urine dipstick: NEGATIVE
Ketones, ur: NEGATIVE mg/dL
Nitrite: NEGATIVE
Protein, ur: NEGATIVE mg/dL
Specific Gravity, Urine: 1.01 (ref 1.005–1.030)
pH: 6 (ref 5.0–8.0)

## 2023-07-19 LAB — CBC WITH DIFFERENTIAL/PLATELET
Abs Immature Granulocytes: 0.03 10*3/uL (ref 0.00–0.07)
Basophils Absolute: 0 10*3/uL (ref 0.0–0.1)
Basophils Relative: 1 %
Eosinophils Absolute: 0.2 10*3/uL (ref 0.0–0.5)
Eosinophils Relative: 2 %
HCT: 37.2 % (ref 36.0–46.0)
Hemoglobin: 12.1 g/dL (ref 12.0–15.0)
Immature Granulocytes: 0 %
Lymphocytes Relative: 15 %
Lymphs Abs: 1.2 10*3/uL (ref 0.7–4.0)
MCH: 30.9 pg (ref 26.0–34.0)
MCHC: 32.5 g/dL (ref 30.0–36.0)
MCV: 95.1 fL (ref 80.0–100.0)
Monocytes Absolute: 0.7 10*3/uL (ref 0.1–1.0)
Monocytes Relative: 9 %
Neutro Abs: 5.8 10*3/uL (ref 1.7–7.7)
Neutrophils Relative %: 73 %
Platelets: 255 10*3/uL (ref 150–400)
RBC: 3.91 MIL/uL (ref 3.87–5.11)
RDW: 21 % — ABNORMAL HIGH (ref 11.5–15.5)
WBC: 7.9 10*3/uL (ref 4.0–10.5)
nRBC: 0 % (ref 0.0–0.2)

## 2023-07-19 NOTE — Progress Notes (Signed)
   07/19/23 0503  Spiritual Encounters  Type of Visit Attempt (pt unavailable)  Care provided to: Pt not available  Referral source Trauma page  Reason for visit Trauma  OnCall Visit Yes   Chaplain responded to level 2 trauma. Patient appeared to be sleeping and there was no family present. No needs at this time.   Alan Lesches, Chaplain Resident [phone]

## 2023-07-19 NOTE — ED Notes (Signed)
 [facility] Called; 2nd in line

## 2023-07-19 NOTE — Progress Notes (Signed)
 Orthopedic Tech Progress Note Patient Details:  Sarah Wolf 07-22-1934 [id number]  Patient ID: Sarah Wolf, female   DOB: October 19, 1934, 89 y.o.   MRN: [id number] Level II; not currently needed. [name] 07/19/2023, 4:05 AM

## 2023-07-19 NOTE — ED Provider Notes (Signed)
 [place] EMERGENCY DEPARTMENT AT [facility]  Provider Note  CSN: [id number] Arrival date & time: 07/19/23 0350  History Chief Complaint  Patient presents with   fall on thinners    Sarah Wolf is a 89 y.o. female brought to the ED via EMS from local [facility]TCF after unwitnessed fall. History of PAD, CHF, HTN, T2DM, A-fib on Eliquis , GERD, DVT, cervical fracture (dens fx, see on visit in Nov 2024 and on two subsequent ED visits in Dec 2024, including yesterday). EMS reports she is supposed to be wearing a C-collar but she took it off and staff at LTCF did not put it back on her. She hit her R eyebrow with this fall, denies any other injuries or pain.    Home Medications Prior to Admission medications   Medication Sig Start Date End Date Taking? Authorizing Provider  apixaban  (ELIQUIS ) 5 MG TABS tablet Take 1 tablet (5 mg total) by mouth 2 (two) times daily. 04/26/23   Oley Bascom RAMAN, NP  atorvastatin  (LIPITOR) 40 MG tablet Take 1 tablet (40 mg total) by mouth daily. 04/26/23   Oley Bascom RAMAN, NP  cefpodoxime  (VANTIN ) 200 MG tablet Take 1 tablet (200 mg total) by mouth 2 (two) times daily. 06/25/23   Raford Lenis, MD  HYDROcodone -acetaminophen  (NORCO/VICODIN) 5-325 MG tablet Take 1 tablet by mouth every 6 (six) hours as needed for moderate pain (pain score 4-6) or severe pain (pain score 7-10). 06/09/23   Barbarann Nest, MD  metoprolol  tartrate (LOPRESSOR ) 100 MG tablet Take 1 tablet (100 mg total) by mouth 2 (two) times daily. 04/26/23   Oley Bascom RAMAN, NP  Multiple Vitamin (MULTIVITAMIN WITH MINERALS) TABS tablet Take 1 tablet by mouth daily. 06/09/23   Barbarann Nest, MD  pantoprazole  (PROTONIX ) 40 MG tablet Take 1 tablet (40 mg total) by mouth 2 (two) times daily. 05/25/23 05/24/24  Armbruster, Elspeth SQUIBB, MD  polyethylene glycol (MIRALAX  / GLYCOLAX ) 17 g packet Take 17 g by mouth daily as needed for mild constipation. 05/08/23   Tobie Yetta HERO, MD  potassium chloride  SA  (KLOR-CON  M) 20 MEQ tablet Take 20 mEq by mouth daily. 06/23/23   [provider]  sucralfate  (CARAFATE ) 1 GM/10ML suspension Take 10 mLs (1 g total) by mouth every 6 (six) hours. 04/26/23   Oley Bascom RAMAN, NP     Allergies    Penicillin g   Review of Systems   Review of Systems Please see HPI for pertinent positives and negatives  Physical Exam BP (!) 161/105   Pulse 84   Temp 98.7 F (37.1 C) (Oral)   Resp 20   Ht 5' 5 (1.651 m)   Wt 60 kg   SpO2 100%   BMI 22.01 kg/m   Physical Exam Vitals and nursing note reviewed.  Constitutional:      Appearance: Normal appearance.  HENT:     Head: Normocephalic.     Comments: Contusion R eyebrow, no laceration    Nose: Nose normal.     Mouth/Throat:     Mouth: Mucous membranes are moist.  Eyes:     Extraocular Movements: Extraocular movements intact.     Conjunctiva/sclera: Conjunctivae normal.     Pupils: Pupils are equal, round, and reactive to light.  Neck:     Comments: In c-collar Cardiovascular:     Rate and Rhythm: Normal rate.  Pulmonary:     Effort: Pulmonary effort is normal.     Breath sounds: Normal breath sounds.  Abdominal:     General: Abdomen is flat.     Palpations: Abdomen is soft.     Tenderness: There is no abdominal tenderness.  Musculoskeletal:        General: No swelling. Normal range of motion.  Skin:    General: Skin is warm and dry.  Neurological:     General: No focal deficit present.     Mental Status: She is alert.  Psychiatric:        Mood and Affect: Mood normal.     ED Results / Procedures / Treatments   EKG EKG Interpretation Date/Time:  Wednesday July 19 2023 04:00:48 EST Ventricular Rate:  66 PR Interval:    QRS Duration:  78 QT Interval:  475 QTC Calculation: 498 R Axis:   37  Text Interpretation: Atrial fibrillation Anterior infarct, old Nonspecific T abnormalities, lateral leads No significant change since last tracing Confirmed by Roselyn Dunnings  [phone]) on 07/19/2023 4:02:12 AM  Procedures Procedures  Medications Ordered in the ED Medications - No data to display  Initial Impression and Plan  Patient here for recurrent falls at LTCF with known dens fracture and noncompliance with C-collar. Will recheck imaging. Current in EMS collar.   ED Course   Clinical Course as of 07/19/23 0702  Wed Jul 19, 2023  0451 Patient now complaining of room spinning dizziness. Will add labs and re-evaluate.  [CS]  [phone] I personally viewed the images from radiology studies and agree with radiologist interpretation: CT neg for acute injury, unchanged subacute dens fracture. Patient changed to aspen collar.  [CS]  0549 CBC is normal.  [CS]  0556 BMP is unremarkable.  [CS]  [phone] Patient states she is feeling well, denies dizziness to me. I reiterated that she needs to keep her collar on at all times until cleared by neurosurgery.  [CS]  [id number] UA with some bacteria, but she is not complaining of any urinary symptoms. Otherwise she is at baseline, no new injuries. Plan to return to SNF. Outpatient Neurosurg follow up for management of dens fracture.  [CS]    Clinical Course User Index [CS] Roselyn Dunnings NOVAK, MD     MDM Rules/Calculators/A&P Medical Decision Making Given presenting complaint, I considered that admission might be necessary. After review of results from ED lab and/or imaging studies, admission to the hospital is not indicated at this time.    Problems Addressed: Closed odontoid fracture with delayed healing, subsequent encounter: chronic illness or injury Contusion of face, initial encounter: acute illness or injury Fall, initial encounter: acute illness or injury  Amount and/or Complexity of Data Reviewed Labs: ordered. Decision-making details documented in ED Course. Radiology: ordered and independent interpretation performed. Decision-making details documented in ED Course. ECG/medicine tests: ordered and independent  interpretation performed. Decision-making details documented in ED Course.  Risk Decision regarding hospitalization.     Final Clinical Impression(s) / ED Diagnoses Final diagnoses:  Fall, initial encounter  Contusion of face, initial encounter  Closed odontoid fracture with delayed healing, subsequent encounter    Rx / DC Orders ED Discharge Orders     None        Roselyn Dunnings NOVAK, MD 07/19/23 [phone]

## 2023-07-19 NOTE — ED Triage Notes (Signed)
 Pt BIB [facility] from [facility]. Pt had unwitnessed fall; pt states she was attempting to get in her wheelchair from her bed and slipped. Hitting her head on a table. R eye has hematoma, c-collar in place. Pt oriented to her baseline.

## 2023-07-20 DIAGNOSIS — M6259 Muscle wasting and atrophy, not elsewhere classified, multiple sites: Secondary | ICD-10-CM | POA: Diagnosis not present

## 2023-07-20 DIAGNOSIS — R278 Other lack of coordination: Secondary | ICD-10-CM | POA: Diagnosis not present

## 2023-07-20 DIAGNOSIS — R296 Repeated falls: Secondary | ICD-10-CM | POA: Diagnosis not present

## 2023-07-20 DIAGNOSIS — R2689 Other abnormalities of gait and mobility: Secondary | ICD-10-CM | POA: Diagnosis not present

## 2023-07-21 DIAGNOSIS — S12121S Other nondisplaced dens fracture, sequela: Secondary | ICD-10-CM | POA: Diagnosis not present

## 2023-07-21 DIAGNOSIS — R296 Repeated falls: Secondary | ICD-10-CM | POA: Diagnosis not present

## 2023-07-24 ENCOUNTER — Other Ambulatory Visit: Payer: Self-pay | Admitting: Nurse Practitioner

## 2023-07-24 DIAGNOSIS — S12121S Other nondisplaced dens fracture, sequela: Secondary | ICD-10-CM | POA: Diagnosis not present

## 2023-07-24 DIAGNOSIS — R296 Repeated falls: Secondary | ICD-10-CM | POA: Diagnosis not present

## 2023-07-27 ENCOUNTER — Ambulatory Visit (INDEPENDENT_AMBULATORY_CARE_PROVIDER_SITE_OTHER): Payer: Medicare Other | Admitting: Nurse Practitioner

## 2023-07-27 ENCOUNTER — Ambulatory Visit: Payer: Self-pay | Admitting: Nurse Practitioner

## 2023-07-27 ENCOUNTER — Encounter: Payer: Self-pay | Admitting: Nurse Practitioner

## 2023-07-27 VITALS — BP 114/68 | HR 85 | Temp 96.4°F | Wt 122.0 lb

## 2023-07-27 DIAGNOSIS — S12120G Other displaced dens fracture, subsequent encounter for fracture with delayed healing: Secondary | ICD-10-CM | POA: Diagnosis not present

## 2023-07-27 NOTE — Patient Instructions (Signed)
 1. Other closed displaced odontoid fracture with delayed healing, subsequent encounter (Primary)  - Ambulatory referral to Orthopedic Surgery  Follow up:  Follow up as needed

## 2023-07-27 NOTE — Progress Notes (Signed)
 Subjective   Patient ID: Sarah Wolf, female    DOB: 02/28/1935, 89 y.o.   MRN: [id number]  Chief Complaint  Patient presents with   Follow-up    Referring provider: Oley Bascom RAMAN, NP  Sarah Wolf is a 89 y.o. female with Past Medical History: No date: Atherosclerosis of native arteries of extremities with rest  pain, right leg (HCC) No date: CHF (congestive heart failure) (HCC) No date: Diaphragmatic hernia without obstruction or gangrene No date: GERD (gastroesophageal reflux disease) No date: History of DVT (deep vein thrombosis) No date: Hypertension No date: Peripheral arterial disease (HCC) No date: Persistent atrial fibrillation (HCC) No date: Personal history of peptic ulcer disease No date: Type 2 diabetes mellitus with diabetic peripheral angiopathy  without gangrene (HCC) No date: Unspecified hearing loss, bilateral   HPI  Patient presents today for ED follow-up.  She has been seen in the ED twice since her last visit here (07/17/2023, 07/19/2023).  Patient is currently in LTCF continues to have multiple unwitnessed falls. History of PAD, CHF, HTN, T2DM, A-fib on Eliquis , GERD, DVT, cervical fracture (dens fx, see on visit in Nov 2024 and on two subsequent ED visits). She is supposed to be wearing a C-collar but EMS reported to the ED physician that she took it off and staff at LTCF did not put it back on her. She hit her R eyebrow with this last fall, denies any other injuries or pain. Does having bruising to face. Denies f/c/s, n/v/d, hemoptysis, PND, leg swelling Denies chest pain or edema.  Note: facility med list shows that eliquis  has been D/C'd. Discussed with patient's nephew and sent note to facility that cardiology should be consulted.   Note: Patient is now under the care of facility PCP and will no longer be coming to this office  Allergies  Allergen Reactions   Penicillin G Hives    There is no immunization history for the selected administration  types on file for this patient.  Tobacco History: Social History   Tobacco Use  Smoking Status Never  Smokeless Tobacco Never   Counseling given: Not Answered   Outpatient Encounter Medications as of 07/27/2023  Medication Sig   atorvastatin  (LIPITOR) 40 MG tablet Take 1 tablet (40 mg total) by mouth daily.   cefpodoxime  (VANTIN ) 200 MG tablet Take 1 tablet (200 mg total) by mouth 2 (two) times daily.   ELIQUIS  5 MG TABS tablet Take 1 tablet by mouth twice daily   HYDROcodone -acetaminophen  (NORCO/VICODIN) 5-325 MG tablet Take 1 tablet by mouth every 6 (six) hours as needed for moderate pain (pain score 4-6) or severe pain (pain score 7-10).   metoprolol  tartrate (LOPRESSOR ) 100 MG tablet Take 1 tablet by mouth twice daily   Multiple Vitamin (MULTIVITAMIN WITH MINERALS) TABS tablet Take 1 tablet by mouth daily.   pantoprazole  (PROTONIX ) 40 MG tablet Take 1 tablet (40 mg total) by mouth 2 (two) times daily.   polyethylene glycol (MIRALAX  / GLYCOLAX ) 17 g packet Take 17 g by mouth daily as needed for mild constipation.   potassium chloride  SA (KLOR-CON  M) 20 MEQ tablet Take 20 mEq by mouth daily.   sucralfate  (CARAFATE ) 1 GM/10ML suspension Take 10 mLs (1 g total) by mouth every 6 (six) hours.   No facility-administered encounter medications on file as of 07/27/2023.    Review of Systems  Review of Systems  Constitutional: Negative.   HENT: Negative.    Cardiovascular: Negative.   Gastrointestinal: Negative.  Allergic/Immunologic: Negative.   Neurological: Negative.   Psychiatric/Behavioral: Negative.       Objective:   BP 114/68   Pulse 85   Temp (!) 96.4 F (35.8 C)   Wt 122 lb (55.3 kg)   BMI 20.30 kg/m   Wt Readings from Last 5 Encounters:  07/27/23 122 lb (55.3 kg)  07/19/23 132 lb 4.4 oz (60 kg)  07/10/23 133 lb 14.4 oz (60.7 kg)  06/29/23 133 lb 14.4 oz (60.7 kg)  06/14/23 133 lb (60.3 kg)     Physical Exam Vitals and nursing note reviewed.   Constitutional:      General: She is not in acute distress.    Appearance: She is well-developed.  Cardiovascular:     Rate and Rhythm: Normal rate and regular rhythm.  Pulmonary:     Effort: Pulmonary effort is normal.     Breath sounds: Normal breath sounds.  Neurological:     Mental Status: She is alert and oriented to person, place, and time.       Assessment & Plan:   Other closed displaced odontoid fracture with delayed healing, subsequent encounter -     Ambulatory referral to Orthopedic Surgery     Return if symptoms worsen or fail to improve.   Bascom GORMAN Borer, NP 07/27/2023

## 2023-07-30 NOTE — Progress Notes (Signed)
 HFimpEF (heart failure with improved ejection fraction)  Tachy mediated CM - EF improved w improved HR Pericardial effusion TTE 03/14/23: EF 40-45, small effuson TTE 05/05/23: EF 50-55, RVSP 53.9, small effusion TTE 06/06/23: EF 50-55, no RWMA, AV sclerosis, small effusion primarily at apex Persistent atrial fibrillation  Atrial flutter Amiodarone  DC'd 2/2 ? ? TSH EP eval (Dr. Nancey) 05/2023 - rate control  Peripheral arterial disease Acute R SFA occlusion and L popliteal occlusion s/p thrombolysis and R SFA stenting 02/2023 (2/2 to AFib) L popliteal occlusion tx medically (Dr. Sheree) Admx c/b aspiration pna requiring intubation, UGI bleed  Aortic atherosclerosis  Hx of DVT in 2020 post ORIF for femur fx Hypertension   Hyperlipidemia  Chronic kidney disease  Chronic hypoxic resp failure due to large hiatal hernia  Dementia  Frequent falls C2 fracture 05/2023  DNR (Do Not Resuscitate)         History of Present Illness:  Discussed the use of AI scribe software for clinical note transcription with the patient, who gave verbal consent to proceed.  Sarah Wolf is a 89 y.o. female who returns for follow up of AFib, CHF.  She was last seen in clinic by Dr. Mealor for EP eval. He recommended rate control for AFib/Flutter.   She was admitted in 05/2023 with frequent falls resulting in C2 - Dens fracture. She was noted to have a L pleural effusion, too small for thoracentesis. She also had a large pericardial effusion on imaging. TTE showed small effusion. She was DC to SNF [facility]). OP follow up with Cardiology was recommended.   She has been seen in the ED x 3 since then with recurrent falls. Her last visit to the ED was 07/19/23.   She is here with her nephew. She is in a wheelchair. She is now living at [facility]. She continues to fall. She has not had chest pain, shortness of breath, syncope, orthopnea, leg edema.       Review of Systems  Respiratory:  Negative  for hemoptysis.   Gastrointestinal:  Negative for hematochezia and melena.  Genitourinary:  Negative for hematuria.  -See HPI    Studies Reviewed:   EKG Interpretation Date/Time:  Monday July 31 2023 14:09:48 EST Ventricular Rate:  86 PR Interval:    QRS Duration:  68 QT Interval:  372 QTC Calculation: 445 R Axis:   -19  Text Interpretation: Atrial fibrillation Anterior infarct , age undetermined No significant change since last tracing Confirmed by Lelon Hamilton [phone]) on 07/31/2023 2:13:07 PM    Labs Reviewed-[facility] 07/19/2023: K 3.5, creatinine 0.91, Hgb 12.1        Risk Assessment/Calculations:    CHA2DS2-VASc Score = 8   This indicates a 10.8% annual risk of stroke. The patient's score is based upon: CHF History: 1 HTN History: 1 Diabetes History: 0 Stroke History: 2 Vascular Disease History: 1 Age Score: 2 Gender Score: 1            Physical Exam:   VS:  BP 110/80   Pulse 86   Ht 5' 5 (1.651 m)   Wt 122 lb (55.3 kg)   SpO2 99%   BMI 20.30 kg/m    Wt Readings from Last 3 Encounters:  07/31/23 122 lb (55.3 kg)  07/27/23 122 lb (55.3 kg)  07/19/23 132 lb 4.4 oz (60 kg)    Constitutional:      Appearance: Not in distress. Frail.  Neck:  Vascular: No JVR.  Pulmonary:     Breath sounds: Normal breath sounds. No wheezing. No rales.  Cardiovascular:     Normal rate. Irregularly irregular rhythm.     Murmurs: There is no murmur.  Edema:    Peripheral edema absent.  Abdominal:     Palpations: Abdomen is soft.  Skin:    General: Skin is warm and dry.      Assessment and Plan:   Assessment & Plan Persistent atrial fibrillation (HCC) Rate control strategy. Heart rate is controlled. CHA2DS2-VASc Score = 8 [CHF History: 1, HTN History: 1, Diabetes History: 0, Stroke History: 2, Vascular Disease History: 1, Age Score: 2, Gender Score: 1].  Therefore, the patient's annual risk of stroke is 10.8 %. She has frequent falls and is at risk for  significant bleeding. I will review with Dr. Okey and Dr. Nancey to see if she is a candidate for Watchman. She is currently under Hospice. Creatinine Clearance is 39 mL/min. Continue Pradaxa 75 mg twice daily, Metoprolol  tartrate 100 mg twice daily. Follow up 3 mos.   Heart failure with improved ejection fraction (HFimpEF) (HCC) EF was 40-45 with RVR. This improved to low normal with improved HR. She is sedentary. Volume appears stable. She is not currently on diuretics.   Pericardial effusion Small on TTE in 05/2023. This has been stable on several echocardiograms. Consider repeating an echocardiogram in 6 mos.         Dispo:  Return in about 3 months (around 10/29/2023) for Routine Follow Up, w/ Dr. Okey.  Signed, Glendia Ferrier, PA-C

## 2023-07-31 ENCOUNTER — Encounter: Payer: Self-pay | Admitting: Physician Assistant

## 2023-07-31 ENCOUNTER — Ambulatory Visit: Payer: Medicare Other | Attending: Physician Assistant | Admitting: Physician Assistant

## 2023-07-31 VITALS — BP 110/80 | HR 86 | Ht 65.0 in | Wt 122.0 lb

## 2023-07-31 DIAGNOSIS — I5032 Chronic diastolic (congestive) heart failure: Secondary | ICD-10-CM

## 2023-07-31 DIAGNOSIS — I3139 Other pericardial effusion (noninflammatory): Secondary | ICD-10-CM

## 2023-07-31 DIAGNOSIS — I4819 Other persistent atrial fibrillation: Secondary | ICD-10-CM | POA: Diagnosis not present

## 2023-07-31 NOTE — Assessment & Plan Note (Signed)
 EF was 40-45 with RVR. This improved to low normal with improved HR. She is sedentary. Volume appears stable. She is not currently on diuretics.

## 2023-07-31 NOTE — Assessment & Plan Note (Signed)
 Small on TTE in 05/2023. This has been stable on several echocardiograms. Consider repeating an echocardiogram in 6 mos.

## 2023-07-31 NOTE — Patient Instructions (Signed)
 Medication Instructions:  Your physician recommends that you continue on your current medications as directed. Please refer to the Current Medication list given to you today.   *If you need a refill on your cardiac medications before your next appointment, please call your pharmacy*   Lab Work: None ordered  If you have labs (blood work) drawn today and your tests are completely normal, you will receive your results only by: MyChart Message (if you have MyChart) OR A paper copy in the mail If you have any lab test that is abnormal or we need to change your treatment, we will call you to review the results.   Testing/Procedures: None ordered   Follow-Up: At [facility], you and your health needs are our priority.  As part of our continuing mission to provide you with exceptional heart care, we have created designated Provider Care Teams.  These Care Teams include your primary Cardiologist (physician) and Advanced Practice Providers (APPs -  Physician Assistants and Nurse Practitioners) who all work together to provide you with the care you need, when you need it.  We recommend signing up for the patient portal called MyChart.  Sign up information is provided on this After Visit Summary.  MyChart is used to connect with patients for Virtual Visits (Telemedicine).  Patients are able to view lab/test results, encounter notes, upcoming appointments, etc.  Non-urgent messages can be sent to your provider as well.   To learn more about what you can do with MyChart, go to [email or web address].    Your next appointment:   2 month(s)  Provider:   Vina Gull, MD     Other Instructions IF WEIGHT GETS OCER 130 CALL OUR OFFICE [phone]

## 2023-07-31 NOTE — Assessment & Plan Note (Addendum)
 Rate control strategy. Heart rate is controlled. CHA2DS2-VASc Score = 8 [CHF History: 1, HTN History: 1, Diabetes History: 0, Stroke History: 2, Vascular Disease History: 1, Age Score: 2, Gender Score: 1].  Therefore, the patient's annual risk of stroke is 10.8 %. She has frequent falls and is at risk for significant bleeding. I will review with Dr. Okey and Dr. Nancey to see if she is a candidate for Watchman. She is currently under Hospice. Creatinine Clearance is 39 mL/min. Continue Pradaxa 75 mg twice daily, Metoprolol  tartrate 100 mg twice daily. Follow up 3 mos.

## 2023-08-01 ENCOUNTER — Institutional Professional Consult (permissible substitution): Payer: Medicare Other | Admitting: Thoracic Surgery (Cardiothoracic Vascular Surgery)

## 2023-08-01 ENCOUNTER — Encounter: Payer: Self-pay | Admitting: Thoracic Surgery (Cardiothoracic Vascular Surgery)

## 2023-08-01 VITALS — BP 108/73 | HR 70 | Resp 20

## 2023-08-01 DIAGNOSIS — D4989 Neoplasm of unspecified behavior of other specified sites: Secondary | ICD-10-CM

## 2023-08-01 NOTE — Progress Notes (Signed)
 PCP is Oley Bascom RAMAN, NP Referring Provider is Oley Bascom RAMAN, NP  Chief Complaint  Patient presents with   Mediastinal Mass    Chest CTA     HPI: Sarah Wolf is sent for consultation regarding an anterior mediastinal nodule.  Sarah Wolf is an 89 year old woman with numerous medical issues including hypertension, hyperlipidemia, persistent atrial fibrillation, chronic combined systolic and diastolic congestive heart failure, paraesophageal hernia, gastritis, duodenal ulcer, chronic hypoxic respiratory failure, DVT, PAD with critical right lower extremity ischemia, and dens fracture.  She has had multiple falls and ER visits related to those.  In August she had a CT of the chest which showed a 1.2 cm anterior mediastinal nodule.  She had another fall in October and a CT at that point in time showed the nodule without change.  Also has a large paraesophageal hernia.  She is a poor historian.  Not wearing a cervical collar.  She says someone who is a doctor told her she did not have to.  Her nephew who is her power of attorney says that is incorrect.  Past Medical History:  Diagnosis Date   Atherosclerosis of native arteries of extremities with rest pain, right leg (HCC)    CHF (congestive heart failure) (HCC)    Diaphragmatic hernia without obstruction or gangrene    GERD (gastroesophageal reflux disease)    History of DVT (deep vein thrombosis)    Hypertension    Peripheral arterial disease (HCC)    Persistent atrial fibrillation (HCC)    Personal history of peptic ulcer disease    Type 2 diabetes mellitus with diabetic peripheral angiopathy without gangrene (HCC)    Unspecified hearing loss, bilateral     Past Surgical History:  Procedure Laterality Date   ABDOMINAL AORTOGRAM W/LOWER EXTREMITY N/A 03/15/2023   Procedure: ABDOMINAL AORTOGRAM W/LOWER EXTREMITY;  Surgeon: Lanis Fonda BRAVO, MD;  Location: [facility] INVASIVE CV LAB;  Service: Cardiovascular;  Laterality: N/A;   BIOPSY   03/26/2023   Procedure: BIOPSY;  Surgeon: Leigh Elspeth SQUIBB, MD;  Location: [facility] ENDOSCOPY;  Service: Gastroenterology;;   ESOPHAGOGASTRODUODENOSCOPY (EGD) WITH PROPOFOL  N/A 03/26/2023   Procedure: ESOPHAGOGASTRODUODENOSCOPY (EGD) WITH PROPOFOL ;  Surgeon: Leigh Elspeth SQUIBB, MD;  Location: [facility] ENDOSCOPY;  Service: Gastroenterology;  Laterality: N/A;   ORIF FEMUR FRACTURE Right 03/08/2019   Procedure: OPEN REDUCTION INTERNAL FIXATION (ORIF) DISTAL FEMUR FRACTURE;  Surgeon: Vernetta Lonni GRADE, MD;  Location: [facility] ORS;  Service: Orthopedics;  Laterality: Right;   PERIPHERAL VASCULAR INTERVENTION Right 03/16/2023   Procedure: PERIPHERAL VASCULAR INTERVENTION;  Surgeon: Gretta Lonni PARAS, MD;  Location: [facility] INVASIVE CV LAB;  Service: Cardiovascular;  Laterality: Right;  SFA   PERIPHERAL VASCULAR THROMBECTOMY N/A 03/16/2023   Procedure: LYSIS RECHECK;  Surgeon: Gretta Lonni PARAS, MD;  Location: [facility] INVASIVE CV LAB;  Service: Cardiovascular;  Laterality: N/A;    History reviewed. No pertinent family history.  Social History Social History   Tobacco Use   Smoking status: Never   Smokeless tobacco: Never  Vaping Use   Vaping status: Never Used  Substance Use Topics   Alcohol use: Not Currently   Drug use: Never    Current Outpatient Medications  Medication Sig Dispense Refill   ALPRAZolam  (XANAX ) 0.25 MG tablet Take 0.25 mg by mouth 2 (two) times daily as needed.     atorvastatin  (LIPITOR) 40 MG tablet Take 1 tablet (40 mg total) by mouth daily. 90 tablet 1   cefpodoxime  (VANTIN ) 200 MG tablet Take 1 tablet (200 mg total) by  mouth 2 (two) times daily. 14 tablet 0   haloperidol  (HALDOL ) 2 MG tablet Take by mouth.     HYDROcodone -acetaminophen  (NORCO/VICODIN) 5-325 MG tablet Take 1 tablet by mouth every 6 (six) hours as needed for moderate pain (pain score 4-6) or severe pain (pain score 7-10). 30 tablet 0   metoprolol  tartrate (LOPRESSOR ) 100 MG tablet Take 1 tablet by mouth twice daily 90  tablet 0   Multiple Vitamin (MULTIVITAMIN WITH MINERALS) TABS tablet Take 1 tablet by mouth daily.     pantoprazole  (PROTONIX ) 40 MG tablet Take 1 tablet (40 mg total) by mouth 2 (two) times daily. 60 tablet 11   polyethylene glycol (MIRALAX  / GLYCOLAX ) 17 g packet Take 17 g by mouth daily as needed for mild constipation. 14 each 0   potassium chloride  SA (KLOR-CON  M) 20 MEQ tablet Take 20 mEq by mouth daily.     PRADAXA 75 MG CAPS capsule Take by mouth daily.     sucralfate  (CARAFATE ) 1 GM/10ML suspension Take 10 mLs (1 g total) by mouth every 6 (six) hours. 414 mL 0   No current facility-administered medications for this visit.    Allergies  Allergen Reactions   Penicillin G Hives    Review of Systems  Constitutional:  Positive for appetite change and fatigue.  HENT:  Negative for trouble swallowing.   Respiratory:  Positive for shortness of breath.   Cardiovascular:  Negative for chest pain and leg swelling.  Musculoskeletal:        In wheelchair    BP 108/73 (BP Location: Left Arm, Patient Position: Sitting, Cuff Size: Normal)   Pulse 70   Resp 20   SpO2 98% Comment: 2LNC Physical Exam Vitals reviewed.  Constitutional:      Comments: Frail-appearing 89 year old woman in wheelchair with nasal cannula oxygen  in place  HENT:     Head: Normocephalic and atraumatic.  Eyes:     General: No scleral icterus. Cardiovascular:     Rate and Rhythm: Normal rate. Rhythm irregular.     Heart sounds: Normal heart sounds. No murmur heard. Pulmonary:     Effort: Pulmonary effort is normal.     Breath sounds: Normal breath sounds. No wheezing or rales.  Musculoskeletal:     Right lower leg: No edema.     Left lower leg: No edema.  Lymphadenopathy:     Cervical: No cervical adenopathy.  Neurological:     Comments: Alert, appropriate for the most part but does wander off topic    Diagnostic Tests: CT CHEST WITHOUT CONTRAST   TECHNIQUE: Multidetector CT imaging of the chest was  performed following the standard protocol without IV contrast.   RADIATION DOSE REDUCTION: This exam was performed according to the departmental dose-optimization program which includes automated exposure control, adjustment of the mA and/or kV according to patient size and/or use of iterative reconstruction technique.   COMPARISON:  Chest radiograph 05/07/2023.  CT chest 03/13/2023   FINDINGS: Cardiovascular: Mild cardiac enlargement. Minimal pericardial effusion. Normal caliber thoracic aorta. Calcification of the aorta and coronary arteries.   Mediastinum/Nodes: Thyroid  gland is unremarkable. Esophagus is decompressed. Esophageal and left diaphragmatic hernia with most of the stomach in the chest. Scattered lymph nodes are not pathologically enlarged, likely reactive.   Lungs/Pleura: Moderate bilateral pleural effusions with basilar atelectasis or consolidation, greater on the left. Changes could represent compressive atelectasis or pneumonia. Consolidations are increased since prior study. No pneumothorax.   Upper Abdomen: Several liver cysts are demonstrated without change since  prior study. Vascular calcifications. No acute abnormalities. Edema demonstrated in the subcutaneous fat of the chest and upper abdomen.   Musculoskeletal: Degenerative changes in the spine and shoulders.   IMPRESSION: 1. Cardiac enlargement with small pericardial effusion. 2. Moderate bilateral pleural effusions with basilar consolidation, possibly atelectasis or pneumonia. Consolidative changes are increasing since prior study. 3. Esophageal and left diaphragmatic hernia with most of the stomach in the chest. 4. Aortic atherosclerosis.     Electronically Signed   By: Elsie Gravely M.D.   On: 05/07/2023 22:48 I personally reviewed her CT images.  There is a large hiatal hernia.  Cardiomegaly.  Extensive aortic atherosclerosis.  1.2 cm anterior mediastinal nodule  Impression: Sarah Wolf  is an 89 year old woman with numerous medical issues including hypertension, hyperlipidemia, persistent atrial fibrillation, chronic combined systolic and diastolic congestive heart failure, paraesophageal hernia, gastritis, duodenal ulcer, chronic hypoxic respiratory failure, DVT, PAD with critical right lower extremity ischemia, and dens fracture.  She has had multiple falls and ER visits related to those.  Anterior mediastinal nodule-small nodule measuring about 1.2 cm.  Differential diagnosis includes thymic tumor, lymphadenopathy and ectopic thyroid .  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  She is not a candidate for surgery.  I did offer her the option of a repeat CT in a year to follow-up on the nodule.  Large hiatal hernia-she denies any symptoms although she is a poor historian and it is hard to get a good grasp on whether or not she has issues related to that.  Not a candidate for surgical intervention.  Plan: Return in 1 year with CT chest to follow-up anterior mediastinal nodule  Elspeth JAYSON Millers, MD [facility] [phone]

## 2023-08-08 ENCOUNTER — Emergency Department (HOSPITAL_COMMUNITY): Payer: Medicare Other

## 2023-08-08 ENCOUNTER — Encounter (HOSPITAL_COMMUNITY): Payer: Self-pay

## 2023-08-08 ENCOUNTER — Emergency Department (HOSPITAL_COMMUNITY)
Admission: EM | Admit: 2023-08-08 | Discharge: 2023-08-19 | Disposition: E | Payer: Medicare Other | Attending: Emergency Medicine | Admitting: Emergency Medicine

## 2023-08-08 DIAGNOSIS — R296 Repeated falls: Secondary | ICD-10-CM | POA: Insufficient documentation

## 2023-08-08 DIAGNOSIS — N186 End stage renal disease: Secondary | ICD-10-CM | POA: Insufficient documentation

## 2023-08-08 DIAGNOSIS — J984 Other disorders of lung: Secondary | ICD-10-CM | POA: Diagnosis not present

## 2023-08-08 DIAGNOSIS — R9389 Abnormal findings on diagnostic imaging of other specified body structures: Secondary | ICD-10-CM | POA: Diagnosis not present

## 2023-08-08 DIAGNOSIS — R410 Disorientation, unspecified: Secondary | ICD-10-CM | POA: Diagnosis not present

## 2023-08-08 DIAGNOSIS — J90 Pleural effusion, not elsewhere classified: Secondary | ICD-10-CM | POA: Diagnosis not present

## 2023-08-08 DIAGNOSIS — R4182 Altered mental status, unspecified: Secondary | ICD-10-CM | POA: Insufficient documentation

## 2023-08-08 DIAGNOSIS — R0602 Shortness of breath: Secondary | ICD-10-CM | POA: Diagnosis not present

## 2023-08-08 MED ORDER — ALBUTEROL SULFATE (2.5 MG/3ML) 0.083% IN NEBU: 2.5000 mg | INHALATION_SOLUTION | RESPIRATORY_TRACT | Status: DC | PRN

## 2023-08-08 MED ORDER — ALBUTEROL SULFATE (2.5 MG/3ML) 0.083% IN NEBU
INHALATION_SOLUTION | RESPIRATORY_TRACT | Status: AC
  Filled 2023-08-08: qty 3

## 2023-08-19 NOTE — ED Triage Notes (Signed)
Pt BIB [facility] after SHOB started after lunch today. Facility felt pt did not aspirate but just has congestion. Pt O2 56% per EMS initially. Pt O2 sat 92% on 15L NRB. Pt has a DNR and wants limited additional interventions. PT is Aox4.

## 2023-08-19 NOTE — ED Notes (Signed)
EDP at bedside  

## 2023-08-19 NOTE — ED Notes (Signed)
Pt found agonal breathing. EDP at bedside.

## 2023-08-19 NOTE — Progress Notes (Signed)
RT called to bedside due to pt's inability to clear secretions. RT was able to get some thick secretions from mouth but pt is not cooperative. RT NTS pt receiving a small amount of secretions. RT will monitor.

## 2023-08-19 NOTE — Progress Notes (Signed)
Patient BIB EMS audible rhonchi and exp.wheezes. RT NTS suctioned patient with a moderate amount of pink/tan secretions obtained. Due to wheezes, RT gave patient an Albuterol neb treatment.

## 2023-08-19 NOTE — ED Provider Notes (Signed)
[place] EMERGENCY DEPARTMENT AT [facility] Provider Note   CSN: [id number] Arrival date & time:   1430     History Chief Complaint  Patient presents with   Shortness of Breath    HPI Sarah Wolf is a 89 y.o. female presenting for AMS/SOB. 89 year old female,, skilled nursing facility for altered mental status.  She is DNR/DNI secondary to end-stage heart failure, frequent falls, recent hip fracture. Family noted that earlier today she started getting progressively short of breath with developing confusion.  EMS was called out patient satting 60% on her normal 3 L started on nonrebreather brought in for further care and management.  Patient's recorded medical, surgical, social, medication list and allergies were reviewed in the Snapshot window as part of the initial history.   Review of Systems   Review of Systems  Constitutional:  Positive for fatigue. Negative for chills and fever.  HENT:  Negative for ear pain and sore throat.   Eyes:  Negative for pain and visual disturbance.  Respiratory:  Positive for cough and shortness of breath.   Cardiovascular:  Negative for chest pain and palpitations.  Gastrointestinal:  Negative for abdominal pain and vomiting.  Genitourinary:  Negative for dysuria and hematuria.  Musculoskeletal:  Negative for arthralgias and back pain.  Skin:  Negative for color change and rash.  Neurological:  Positive for weakness. Negative for seizures and syncope.  All other systems reviewed and are negative.   Physical Exam Updated Vital Signs BP 93/73 (BP Location: Right Arm)   Pulse (!) 120   Temp (!) 96.9 F (36.1 C) (Axillary)   Resp (!) 30   SpO2 90%  Physical Exam Vitals and nursing note reviewed.  Constitutional:      General: She is not in acute distress.    Appearance: She is well-developed.  HENT:     Head: Normocephalic and atraumatic.  Eyes:     Conjunctiva/sclera: Conjunctivae normal.  Cardiovascular:      Rate and Rhythm: Normal rate and regular rhythm.     Heart sounds: No murmur heard. Pulmonary:     Effort: Respiratory distress present.     Breath sounds: Normal breath sounds.  Abdominal:     Palpations: Abdomen is soft.     Tenderness: There is no abdominal tenderness.  Musculoskeletal:        General: No swelling.     Cervical back: Neck supple.  Skin:    General: Skin is warm and dry.     Capillary Refill: Capillary refill takes less than 2 seconds.  Neurological:     Mental Status: She is alert.  Psychiatric:        Mood and Affect: Mood normal.      ED Course/ Medical Decision Making/ A&P    Procedures .Critical Care  Performed by: Glyn Ade, MD Authorized by: Glyn Ade, MD   Critical care provider statement:    Critical care time (minutes):  80   Critical care was necessary to treat or prevent imminent or life-threatening deterioration of the following conditions:  Respiratory failure   Critical care was time spent personally by me on the following activities:  Development of treatment plan with patient or surrogate, discussions with consultants, evaluation of patient's response to treatment, examination of patient, ordering and review of laboratory studies, ordering and review of radiographic studies, ordering and performing treatments and interventions, pulse oximetry, re-evaluation of patient's condition and review of old charts    Medications Ordered in ED  Medications  albuterol (PROVENTIL) (2.5 MG/3ML) 0.083% nebulizer solution 2.5 mg (has no administration in time range)  albuterol (PROVENTIL) (2.5 MG/3ML) 0.083% nebulizer solution (  Given  1444)    Medical Decision Making:    Sarah Wolf is a 89 y.o. female who presented to the ED today with chief complaint of respiratory distress detailed above.     Patient placed on continuous vitals and telemetry monitoring while in ED which was reviewed periodically.   Complete initial  physical exam performed, notably the patient  was satting 80% on 6 L nasal cannula.      Reviewed and confirmed nursing documentation for past medical history, family history, social history.    Initial Assessment:   Patient's presentation is most consistent with either aspiration event or acute heart failure exacerbation. I immediately called family due to patient's deterioration.  Her clinical status is consistent with immediate life-threatening pathology and she is likely end-of-life based on the severity of her respiratory distress.  They have reinforced her DNR/DNI status and are coming to be with the patient prior to pursuing comfort measures.  We were able to get a chest x-ray that showed fairly substantial right lung disease but patient was not tolerating attempts at IV access and it seemed to be making her less comfortable.  Family elected to wait until they were on campus before pursuing IV access again.  I was called back to patient's bedside emergently 10 minutes before the family arrived because patient had acute deterioration.  Her respirations had become agonal, saturation had decreased to 40% and heart rate had become inconsistent.  I immediately called family who wanted to continue with the DNR/DNI status and allow patient to pass naturally without intervention if she was to pass before they arrived. Over the following 15 minutes, patient's heart rate declined, she became bradycardic, hypotensive and desatted to the low teens before losing pulse oximetry readings. Agonal respirations continued briefly and patient went into PEA.  I reassessed the patient at 1858: No heart sounds, pupils unreactive, patient unreactive, no breath sounds, unresponsive to painful stimuli.  Time of death called at this time. Death certificate for [facility] filled out and completed based on presentation tonight.  Patient does not require transfer for medical examiner based on natural causes of death  including her underlying heart failure, stroke and aspiration event. Family was updated at bedside on patient's demise and provided nursing with information regarding funeral planning.  Clinical Impression:  1. Shortness of breath      Expired   Final Clinical Impression(s) / ED Diagnoses Final diagnoses:  Shortness of breath    Rx / DC Orders ED Discharge Orders     None         Glyn Ade, MD  [id number]

## 2023-08-19 DEATH — deceased

## 2023-09-14 ENCOUNTER — Encounter (HOSPITAL_COMMUNITY): Payer: Medicare Other

## 2023-09-14 ENCOUNTER — Ambulatory Visit: Payer: Medicare Other

## 2023-09-27 ENCOUNTER — Ambulatory Visit: Payer: Self-pay | Admitting: Nurse Practitioner

## 2023-10-12 ENCOUNTER — Ambulatory Visit: Payer: Medicare Other | Admitting: Podiatry

## 2023-10-16 ENCOUNTER — Ambulatory Visit: Payer: Medicare Other | Admitting: Internal Medicine

## 2023-10-17 ENCOUNTER — Ambulatory Visit: Payer: Medicare Other | Admitting: Internal Medicine
